# Patient Record
Sex: Female | Born: 1955 | Race: White | Hispanic: No | Smoking: Current every day smoker
Health system: Southern US, Community
[De-identification: ages and names within clinical notes are randomized; demographics above are authoritative.]

## PROBLEM LIST (undated history)

## (undated) ENCOUNTER — Emergency Department (HOSPITAL_COMMUNITY): Admission: EM | Payer: Self-pay | Source: Home / Self Care

## (undated) DIAGNOSIS — I10 Essential (primary) hypertension: Secondary | ICD-10-CM

## (undated) DIAGNOSIS — I639 Cerebral infarction, unspecified: Secondary | ICD-10-CM

## (undated) DIAGNOSIS — E876 Hypokalemia: Secondary | ICD-10-CM

## (undated) DIAGNOSIS — F0390 Unspecified dementia without behavioral disturbance: Secondary | ICD-10-CM

## (undated) DIAGNOSIS — F17200 Nicotine dependence, unspecified, uncomplicated: Secondary | ICD-10-CM

## (undated) DIAGNOSIS — R1310 Dysphagia, unspecified: Secondary | ICD-10-CM

## (undated) HISTORY — PX: ABDOMINAL HYSTERECTOMY: SUR658

---

## 2001-04-16 ENCOUNTER — Emergency Department (HOSPITAL_COMMUNITY): Admission: EM | Admit: 2001-04-16 | Discharge: 2001-04-16 | Payer: Self-pay | Admitting: Emergency Medicine

## 2003-05-04 ENCOUNTER — Emergency Department (HOSPITAL_COMMUNITY): Admission: EM | Admit: 2003-05-04 | Discharge: 2003-05-04 | Payer: Self-pay | Admitting: Emergency Medicine

## 2005-04-16 ENCOUNTER — Emergency Department (HOSPITAL_COMMUNITY): Admission: EM | Admit: 2005-04-16 | Discharge: 2005-04-16 | Payer: Self-pay | Admitting: Emergency Medicine

## 2012-10-22 ENCOUNTER — Other Ambulatory Visit: Payer: Self-pay | Admitting: Internal Medicine

## 2020-09-17 ENCOUNTER — Emergency Department (HOSPITAL_COMMUNITY): Payer: Medicare Other

## 2020-09-17 ENCOUNTER — Inpatient Hospital Stay (HOSPITAL_COMMUNITY)
Admission: EM | Admit: 2020-09-17 | Discharge: 2020-10-09 | DRG: 064 | Disposition: A | Discharge status: Skilled Nursing Facility | Payer: Medicare Other | Attending: Internal Medicine | Admitting: Internal Medicine

## 2020-09-17 DIAGNOSIS — I6349 Cerebral infarction due to embolism of other cerebral artery: Secondary | ICD-10-CM | POA: Diagnosis not present

## 2020-09-17 DIAGNOSIS — E559 Vitamin D deficiency, unspecified: Secondary | ICD-10-CM | POA: Diagnosis present

## 2020-09-17 DIAGNOSIS — R29810 Facial weakness: Secondary | ICD-10-CM | POA: Diagnosis present

## 2020-09-17 DIAGNOSIS — N390 Urinary tract infection, site not specified: Secondary | ICD-10-CM | POA: Diagnosis not present

## 2020-09-17 DIAGNOSIS — Z20822 Contact with and (suspected) exposure to covid-19: Secondary | ICD-10-CM | POA: Diagnosis present

## 2020-09-17 DIAGNOSIS — G8191 Hemiplegia, unspecified affecting right dominant side: Secondary | ICD-10-CM | POA: Diagnosis present

## 2020-09-17 DIAGNOSIS — R636 Underweight: Secondary | ICD-10-CM | POA: Diagnosis present

## 2020-09-17 DIAGNOSIS — R159 Full incontinence of feces: Secondary | ICD-10-CM | POA: Diagnosis present

## 2020-09-17 DIAGNOSIS — R109 Unspecified abdominal pain: Secondary | ICD-10-CM

## 2020-09-17 DIAGNOSIS — R509 Fever, unspecified: Secondary | ICD-10-CM

## 2020-09-17 DIAGNOSIS — Z23 Encounter for immunization: Secondary | ICD-10-CM

## 2020-09-17 DIAGNOSIS — E876 Hypokalemia: Secondary | ICD-10-CM | POA: Diagnosis present

## 2020-09-17 DIAGNOSIS — R64 Cachexia: Secondary | ICD-10-CM | POA: Diagnosis present

## 2020-09-17 DIAGNOSIS — I10 Essential (primary) hypertension: Secondary | ICD-10-CM | POA: Diagnosis present

## 2020-09-17 DIAGNOSIS — R32 Unspecified urinary incontinence: Secondary | ICD-10-CM | POA: Diagnosis present

## 2020-09-17 DIAGNOSIS — R29710 NIHSS score 10: Secondary | ICD-10-CM | POA: Diagnosis present

## 2020-09-17 DIAGNOSIS — A419 Sepsis, unspecified organism: Secondary | ICD-10-CM | POA: Diagnosis not present

## 2020-09-17 DIAGNOSIS — R4182 Altered mental status, unspecified: Secondary | ICD-10-CM

## 2020-09-17 DIAGNOSIS — I639 Cerebral infarction, unspecified: Secondary | ICD-10-CM | POA: Diagnosis present

## 2020-09-17 DIAGNOSIS — J449 Chronic obstructive pulmonary disease, unspecified: Secondary | ICD-10-CM | POA: Diagnosis present

## 2020-09-17 DIAGNOSIS — Z681 Body mass index (BMI) 19 or less, adult: Secondary | ICD-10-CM

## 2020-09-17 DIAGNOSIS — D72829 Elevated white blood cell count, unspecified: Secondary | ICD-10-CM | POA: Diagnosis present

## 2020-09-17 DIAGNOSIS — R1310 Dysphagia, unspecified: Secondary | ICD-10-CM | POA: Diagnosis present

## 2020-09-17 DIAGNOSIS — F17200 Nicotine dependence, unspecified, uncomplicated: Secondary | ICD-10-CM | POA: Diagnosis present

## 2020-09-17 DIAGNOSIS — E785 Hyperlipidemia, unspecified: Secondary | ICD-10-CM | POA: Diagnosis present

## 2020-09-17 DIAGNOSIS — R54 Age-related physical debility: Secondary | ICD-10-CM | POA: Diagnosis present

## 2020-09-17 DIAGNOSIS — R5381 Other malaise: Secondary | ICD-10-CM | POA: Diagnosis present

## 2020-09-17 DIAGNOSIS — G9341 Metabolic encephalopathy: Secondary | ICD-10-CM | POA: Diagnosis present

## 2020-09-17 DIAGNOSIS — K029 Dental caries, unspecified: Secondary | ICD-10-CM | POA: Diagnosis present

## 2020-09-17 DIAGNOSIS — I6523 Occlusion and stenosis of bilateral carotid arteries: Secondary | ICD-10-CM | POA: Diagnosis present

## 2020-09-17 HISTORY — DX: Nicotine dependence, unspecified, uncomplicated: F17.200

## 2020-09-17 LAB — COMPREHENSIVE METABOLIC PANEL
ALT: 13 U/L (ref 0–44)
AST: 19 U/L (ref 15–41)
Albumin: 4.2 g/dL (ref 3.5–5.0)
Alkaline Phosphatase: 77 U/L (ref 38–126)
Anion gap: 12 (ref 5–15)
BUN: 13 mg/dL (ref 8–23)
CO2: 25 mmol/L (ref 22–32)
Calcium: 9.5 mg/dL (ref 8.9–10.3)
Chloride: 103 mmol/L (ref 98–111)
Creatinine, Ser: 0.77 mg/dL (ref 0.44–1.00)
GFR, Estimated: >60 mL/min (ref >60)
Glucose, Bld: 120 mg/dL — ABNORMAL HIGH (ref 70–99)
Potassium: 3.5 mmol/L (ref 3.5–5.1)
Sodium: 140 mmol/L (ref 135–145)
Total Bilirubin: 0.7 mg/dL (ref 0.3–1.2)
Total Protein: 7.5 g/dL (ref 6.5–8.1)

## 2020-09-17 LAB — CBC WITH DIFFERENTIAL/PLATELET
Abs Immature Granulocytes: 0.04 10*3/uL (ref 0.00–0.07)
Basophils Absolute: 0.1 10*3/uL (ref 0.0–0.1)
Basophils Relative: 0 %
Eosinophils Absolute: 0 10*3/uL (ref 0.0–0.5)
Eosinophils Relative: 0 %
HCT: 45 % (ref 36.0–46.0)
Hemoglobin: 15.1 g/dL — ABNORMAL HIGH (ref 12.0–15.0)
Immature Granulocytes: 0 %
Lymphocytes Relative: 16 %
Lymphs Abs: 1.8 10*3/uL (ref 0.7–4.0)
MCH: 31.5 pg (ref 26.0–34.0)
MCHC: 33.6 g/dL (ref 30.0–36.0)
MCV: 93.8 fL (ref 80.0–100.0)
Monocytes Absolute: 0.8 10*3/uL (ref 0.1–1.0)
Monocytes Relative: 7 %
Neutro Abs: 8.9 10*3/uL — ABNORMAL HIGH (ref 1.7–7.7)
Neutrophils Relative %: 77 %
Platelets: 308 10*3/uL (ref 150–400)
RBC: 4.8 MIL/uL (ref 3.87–5.11)
RDW: 13.4 % (ref 11.5–15.5)
WBC: 11.6 10*3/uL — ABNORMAL HIGH (ref 4.0–10.5)
nRBC: 0 % (ref 0.0–0.2)

## 2020-09-17 LAB — RESP PANEL BY RT-PCR (FLU A&B, COVID) ARPGX2
Influenza A by PCR: NEGATIVE
Influenza B by PCR: NEGATIVE
SARS Coronavirus 2 by RT PCR: NEGATIVE

## 2020-09-17 LAB — BLOOD GAS, ARTERIAL
Acid-base deficit: 0.6 mmol/L (ref 0.0–2.0)
Bicarbonate: 24 mmol/L (ref 20.0–28.0)
FIO2: 28
O2 Saturation: 93.5 %
Patient temperature: 37
pCO2 arterial: 37.7 mmHg (ref 32.0–48.0)
pH, Arterial: 7.41 (ref 7.350–7.450)
pO2, Arterial: 74.4 mmHg — ABNORMAL LOW (ref 83.0–108.0)

## 2020-09-17 LAB — ETHANOL: Alcohol, Ethyl (B): <10 mg/dL (ref <10)

## 2020-09-17 LAB — TROPONIN I (HIGH SENSITIVITY): Troponin I (High Sensitivity): 5 ng/L (ref <18)

## 2020-09-17 MED ORDER — NALOXONE HCL 2 MG/2ML IJ SOSY
1.0000 mg | PREFILLED_SYRINGE | Freq: Once | INTRAMUSCULAR | Status: AC
  Administered 2020-09-17: 1 mg via INTRAVENOUS
  Filled 2020-09-17: qty 2

## 2020-09-17 NOTE — ED Triage Notes (Signed)
Pt spoke to family on the phone Friday. Family hasn't heard from her since. Pt found this afternoon on couch. Pt altered, soiled self with urine and feces. Pt has 18G left wrist placed by EMS, 125 solumedrol and albuterol.

## 2020-09-17 NOTE — ED Provider Notes (Signed)
[facility] EMERGENCY DEPARTMENT Provider Note   CSN: [id number] Arrival date & time: 09/17/20  2226     History Chief Complaint  Patient presents with   Altered Mental Status    Kendra Lee is a 65 y.o. female. level 5 caveat due to altered mental status. The history is provided by the EMS personnel.  Altered Mental Status Patient brought in with altered mental status.  Reportedly does not really see doctors and only occasionally takes some Benadryl.  Last seen by family couple days ago.  Found on the couch today.  Incontinent of urine and feces.  Hypertensive.  Had some harsh breath sounds.  Reportedly is a heavy smoker.  Is awake and will say occasional words and follow commands but really cannot provide any history.    No past medical history on file.  There are no problems to display for this patient.     OB History   No obstetric history on file.     No family history on file.     Home Medications Prior to Admission medications   Not on File    Allergies    Patient has no allergy information on record.  Review of Systems   Review of Systems  Unable to perform ROS: Mental status change   Physical Exam Updated Vital Signs BP (!) 192/88   Pulse 84   Temp 98.1 F (36.7 C) (Core (Comment))   Resp 17   SpO2 99%   Physical Exam Vitals and nursing note reviewed.  Constitutional:      Comments: Sitting in bed.  Drooling some but will awaken answer questions.  Will squeeze both hands to commands.  However very few words.  HENT:     Head: Normocephalic and atraumatic.  Eyes:     Pupils: Pupils are equal, round, and reactive to light.  Cardiovascular:     Rate and Rhythm: Regular rhythm.  Pulmonary:     Comments: Harsh breath sounds. Abdominal:     Tenderness: There is no abdominal tenderness.  Musculoskeletal:        General: No tenderness.     Cervical back: Neck supple.     Right lower leg: No edema.     Left lower leg: No edema.  Skin:     General: Skin is warm.     Capillary Refill: Capillary refill takes less than 2 seconds.  Neurological:     Comments: Awake and will answer some minimal questions and follow commands although really cannot provide history.  Moving bilateral extremities.  Some drooling.    ED Results / Procedures / Treatments   Labs (all labs ordered are listed, but only abnormal results are displayed) Labs Reviewed  COMPREHENSIVE METABOLIC PANEL - Abnormal; Notable for the following components:      Result Value   Glucose, Bld 120 (*)    All other components within normal limits  CBC WITH DIFFERENTIAL/PLATELET - Abnormal; Notable for the following components:   WBC 11.6 (*)    Hemoglobin 15.1 (*)    Neutro Abs 8.9 (*)    All other components within normal limits  BLOOD GAS, ARTERIAL - Abnormal; Notable for the following components:   pO2, Arterial 74.4 (*)    All other components within normal limits  RESP PANEL BY RT-PCR (FLU A&B, COVID) ARPGX2  CULTURE, BLOOD (ROUTINE X 2)  CULTURE, BLOOD (ROUTINE X 2)  ETHANOL  RAPID URINE DRUG SCREEN, HOSP PERFORMED  LACTIC ACID, PLASMA  LACTIC ACID, PLASMA  URINALYSIS, ROUTINE W REFLEX MICROSCOPIC  AMMONIA  TSH  TROPONIN I (HIGH SENSITIVITY)  TROPONIN I (HIGH SENSITIVITY)    EKG None  Radiology CT Head Wo Contrast  Result Date: 09/17/2020 CLINICAL DATA:  Mental status change, found this afternoon on couch EXAM: CT HEAD WITHOUT CONTRAST TECHNIQUE: Contiguous axial images were obtained from the base of the skull through the vertex without intravenous contrast. COMPARISON:  None. FINDINGS: Brain: Foci of hypoattenuation present in the right internal capsule as as a separate focus in the right caudate and posterior limb of the left internal capsule could reflect sequela of age-indeterminate lacunar type infarcts difficult to fully assess given a background of more diffuse patchy white matter hypoattenuation typically indicative of microvascular angiopathy  in a patient of this age. Additional hypoattenuation in the pons and brainstem is more nonspecific given streak artifact across skull base. Symmetric prominence of the ventricles, cisterns and sulci compatible with parenchymal volume loss. No hyperdense hemorrhage. No mass effect or midline shift. No extra-axial collection. Scattered benign dural calcifications. Vascular: Atherosclerotic calcification of the carotid siphons. No hyperdense vessel. Skull: No calvarial fracture or suspicious osseous lesion. No scalp swelling or hematoma. Sinuses/Orbits: Paranasal sinuses and mastoid air cells are predominantly clear. Other: Included orbital structures are unremarkable. 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 Could be further characterized with MR imaging as warranted. No other acute intracranial abnormality. These results were called by telephone at the time of interpretation on 09/17/2020 at 11:45 pm to provider Benjiman Core , who verbally acknowledged these results. Electronically Signed   By: Kreg Shropshire M.D.   On: 09/17/2020 23:45   DG Chest Portable 1 View  Result Date: 09/17/2020 CLINICAL DATA:  Altered mental status EXAM: PORTABLE CHEST 1 VIEW COMPARISON:  None FINDINGS: There is hyperinflation of the lungs compatible with COPD. Heart and mediastinal contours are within normal limits. No focal opacities or effusions. No acute bony abnormality. IMPRESSION: COPD.  No active disease. Electronically Signed   By: Charlett Nose M.D.   On: 09/17/2020 23:02    Procedures Procedures   Medications Ordered in ED Medications  naloxone [facility]) injection 1 mg (1 mg Intravenous Given 09/17/20 2340)    ED Course  I have reviewed the triage  vital signs and the nursing notes.  Pertinent labs & imaging results that were available during my care of the patient were reviewed by me and considered in my medical decision making (see chart for details).    MDM Rules/Calculators/A&P                          Patient brought in with mental status change.  Last seen 2 days ago.  Moves all extremities but appears to be moving right upper extremity less than left.  Family later came and thinks there is a new facial droop on the right side.  Hypertensive.  Head CT shows possible acute strokes.  Does not appear to need intubation at this time.  Care turned over to Dr. Manus Gunning.  There is potential of acute stroke but last normal would have been around 2 days ago so not a tPA candidate.  CRITICAL CARE Performed by: Benjiman Core Total critical care time:30  minutes Critical care time was exclusive  of separately billable procedures and treating other patients. Critical care was necessary to treat or prevent imminent or life-threatening deterioration. Critical care was time spent personally by me on the following activities: development of treatment plan with patient and/or surrogate as well as nursing, discussions with consultants, evaluation of patient's response to treatment, examination of patient, obtaining history from patient or surrogate, ordering and performing treatments and interventions, ordering and review of laboratory studies, ordering and review of radiographic studies, pulse oximetry and re-evaluation of patient's condition.  Final Clinical Impression(s) / ED Diagnoses Final diagnoses:  Altered mental status, unspecified altered mental status type  Hypertension, unspecified type    Rx / DC Orders ED Discharge Orders     None        Benjiman Core, MD 09/18/20 [phone]

## 2020-09-18 ENCOUNTER — Inpatient Hospital Stay (HOSPITAL_COMMUNITY): Payer: Medicare Other

## 2020-09-18 DIAGNOSIS — K029 Dental caries, unspecified: Secondary | ICD-10-CM | POA: Diagnosis present

## 2020-09-18 DIAGNOSIS — D72829 Elevated white blood cell count, unspecified: Secondary | ICD-10-CM

## 2020-09-18 DIAGNOSIS — E876 Hypokalemia: Secondary | ICD-10-CM | POA: Diagnosis present

## 2020-09-18 DIAGNOSIS — J449 Chronic obstructive pulmonary disease, unspecified: Secondary | ICD-10-CM | POA: Diagnosis present

## 2020-09-18 DIAGNOSIS — R29710 NIHSS score 10: Secondary | ICD-10-CM | POA: Diagnosis present

## 2020-09-18 DIAGNOSIS — I639 Cerebral infarction, unspecified: Secondary | ICD-10-CM | POA: Diagnosis present

## 2020-09-18 DIAGNOSIS — R29810 Facial weakness: Secondary | ICD-10-CM | POA: Diagnosis present

## 2020-09-18 DIAGNOSIS — I6389 Other cerebral infarction: Secondary | ICD-10-CM | POA: Diagnosis not present

## 2020-09-18 DIAGNOSIS — G9341 Metabolic encephalopathy: Secondary | ICD-10-CM | POA: Diagnosis present

## 2020-09-18 DIAGNOSIS — Z681 Body mass index (BMI) 19 or less, adult: Secondary | ICD-10-CM | POA: Diagnosis not present

## 2020-09-18 DIAGNOSIS — I10 Essential (primary) hypertension: Secondary | ICD-10-CM

## 2020-09-18 DIAGNOSIS — A419 Sepsis, unspecified organism: Secondary | ICD-10-CM | POA: Diagnosis not present

## 2020-09-18 DIAGNOSIS — R159 Full incontinence of feces: Secondary | ICD-10-CM | POA: Diagnosis present

## 2020-09-18 DIAGNOSIS — N390 Urinary tract infection, site not specified: Secondary | ICD-10-CM | POA: Diagnosis not present

## 2020-09-18 DIAGNOSIS — E559 Vitamin D deficiency, unspecified: Secondary | ICD-10-CM | POA: Diagnosis present

## 2020-09-18 DIAGNOSIS — Z20822 Contact with and (suspected) exposure to covid-19: Secondary | ICD-10-CM | POA: Diagnosis present

## 2020-09-18 DIAGNOSIS — R54 Age-related physical debility: Secondary | ICD-10-CM | POA: Diagnosis present

## 2020-09-18 DIAGNOSIS — I6349 Cerebral infarction due to embolism of other cerebral artery: Secondary | ICD-10-CM | POA: Diagnosis present

## 2020-09-18 DIAGNOSIS — Z7189 Other specified counseling: Secondary | ICD-10-CM | POA: Diagnosis not present

## 2020-09-18 DIAGNOSIS — I6523 Occlusion and stenosis of bilateral carotid arteries: Secondary | ICD-10-CM | POA: Diagnosis present

## 2020-09-18 DIAGNOSIS — R32 Unspecified urinary incontinence: Secondary | ICD-10-CM | POA: Diagnosis present

## 2020-09-18 DIAGNOSIS — Z515 Encounter for palliative care: Secondary | ICD-10-CM | POA: Diagnosis not present

## 2020-09-18 DIAGNOSIS — R64 Cachexia: Secondary | ICD-10-CM | POA: Diagnosis present

## 2020-09-18 DIAGNOSIS — R1310 Dysphagia, unspecified: Secondary | ICD-10-CM | POA: Diagnosis present

## 2020-09-18 DIAGNOSIS — E785 Hyperlipidemia, unspecified: Secondary | ICD-10-CM | POA: Diagnosis present

## 2020-09-18 DIAGNOSIS — G8191 Hemiplegia, unspecified affecting right dominant side: Secondary | ICD-10-CM | POA: Diagnosis present

## 2020-09-18 DIAGNOSIS — Z23 Encounter for immunization: Secondary | ICD-10-CM | POA: Diagnosis present

## 2020-09-18 DIAGNOSIS — R5381 Other malaise: Secondary | ICD-10-CM | POA: Diagnosis present

## 2020-09-18 LAB — URINALYSIS, ROUTINE W REFLEX MICROSCOPIC
Bacteria, UA: NONE SEEN
Bilirubin Urine: NEGATIVE
Glucose, UA: NEGATIVE mg/dL
Ketones, ur: 20 mg/dL — AB
Leukocytes,Ua: NEGATIVE
Nitrite: NEGATIVE
Protein, ur: NEGATIVE mg/dL
Specific Gravity, Urine: 1.013 (ref 1.005–1.030)
pH: 5 (ref 5.0–8.0)

## 2020-09-18 LAB — ECHOCARDIOGRAM COMPLETE
AR max vel: 2.85 cm2
AV Area VTI: 2.71 cm2
AV Area mean vel: 2.55 cm2
AV Mean grad: 3 mmHg
AV Peak grad: 5.7 mmHg
Ao pk vel: 1.19 m/s
Area-P 1/2: 3.15 cm2
MV VTI: 2.54 cm2
S' Lateral: 2.91 cm

## 2020-09-18 LAB — CBC
HCT: 41 % (ref 36.0–46.0)
Hemoglobin: 13.8 g/dL (ref 12.0–15.0)
MCH: 31.7 pg (ref 26.0–34.0)
MCHC: 33.7 g/dL (ref 30.0–36.0)
MCV: 94 fL (ref 80.0–100.0)
Platelets: 301 10*3/uL (ref 150–400)
RBC: 4.36 MIL/uL (ref 3.87–5.11)
RDW: 13.4 % (ref 11.5–15.5)
WBC: 9.8 10*3/uL (ref 4.0–10.5)
nRBC: 0 % (ref 0.0–0.2)

## 2020-09-18 LAB — COMPREHENSIVE METABOLIC PANEL
ALT: 12 U/L (ref 0–44)
AST: 16 U/L (ref 15–41)
Albumin: 3.7 g/dL (ref 3.5–5.0)
Alkaline Phosphatase: 66 U/L (ref 38–126)
Anion gap: 9 (ref 5–15)
BUN: 15 mg/dL (ref 8–23)
CO2: 25 mmol/L (ref 22–32)
Calcium: 9.2 mg/dL (ref 8.9–10.3)
Chloride: 106 mmol/L (ref 98–111)
Creatinine, Ser: 0.66 mg/dL (ref 0.44–1.00)
GFR, Estimated: >60 mL/min (ref >60)
Glucose, Bld: 149 mg/dL — ABNORMAL HIGH (ref 70–99)
Potassium: 3.6 mmol/L (ref 3.5–5.1)
Sodium: 140 mmol/L (ref 135–145)
Total Bilirubin: 0.6 mg/dL (ref 0.3–1.2)
Total Protein: 6.6 g/dL (ref 6.5–8.1)

## 2020-09-18 LAB — LIPID PANEL
Cholesterol: 217 mg/dL — ABNORMAL HIGH (ref 0–200)
HDL: 63 mg/dL (ref >40)
LDL Cholesterol: 138 mg/dL — ABNORMAL HIGH (ref 0–99)
Total CHOL/HDL Ratio: 3.4 RATIO
Triglycerides: 81 mg/dL (ref <150)
VLDL: 16 mg/dL (ref 0–40)

## 2020-09-18 LAB — LACTIC ACID, PLASMA
Lactic Acid, Venous: 1.2 mmol/L (ref 0.5–1.9)
Lactic Acid, Venous: 1.4 mmol/L (ref 0.5–1.9)

## 2020-09-18 LAB — RAPID URINE DRUG SCREEN, HOSP PERFORMED
Amphetamines: NOT DETECTED
Barbiturates: NOT DETECTED
Benzodiazepines: NOT DETECTED
Cocaine: NOT DETECTED
Opiates: NOT DETECTED
Tetrahydrocannabinol: NOT DETECTED

## 2020-09-18 LAB — TROPONIN I (HIGH SENSITIVITY): Troponin I (High Sensitivity): 6 ng/L (ref <18)

## 2020-09-18 LAB — AMMONIA: Ammonia: 15 umol/L (ref 9–35)

## 2020-09-18 LAB — VITAMIN B12: Vitamin B-12: 262 pg/mL (ref 180–914)

## 2020-09-18 LAB — TSH: TSH: 1.267 u[IU]/mL (ref 0.350–4.500)

## 2020-09-18 LAB — MAGNESIUM: Magnesium: 2.1 mg/dL (ref 1.7–2.4)

## 2020-09-18 LAB — HIV ANTIBODY (ROUTINE TESTING W REFLEX): HIV Screen 4th Generation wRfx: NONREACTIVE

## 2020-09-18 MED ORDER — HEPARIN SODIUM (PORCINE) 5000 UNIT/ML IJ SOLN
5000.0000 [IU] | Freq: Three times a day (TID) | INTRAMUSCULAR | Status: DC
  Administered 2020-09-18 – 2020-09-21 (×10): 5000 [IU] via SUBCUTANEOUS
  Filled 2020-09-18 (×11): qty 1

## 2020-09-18 MED ORDER — CYANOCOBALAMIN 1000 MCG/ML IJ SOLN
1000.0000 ug | Freq: Once | INTRAMUSCULAR | Status: AC
  Administered 2020-09-18: 1000 ug via INTRAMUSCULAR
  Filled 2020-09-18: qty 1

## 2020-09-18 MED ORDER — ACETAMINOPHEN 650 MG RE SUPP
650.0000 mg | RECTAL | Status: DC | PRN
  Administered 2020-09-22 – 2020-10-04 (×2): 650 mg via RECTAL
  Filled 2020-09-18 (×3): qty 1

## 2020-09-18 MED ORDER — SODIUM CHLORIDE 0.9 % IV SOLN: INTRAVENOUS | Status: DC

## 2020-09-18 MED ORDER — ACETAMINOPHEN 325 MG PO TABS
650.0000 mg | ORAL_TABLET | ORAL | Status: DC | PRN
  Administered 2020-09-28 – 2020-10-06 (×2): 650 mg via ORAL
  Filled 2020-09-18 (×2): qty 2

## 2020-09-18 MED ORDER — ASPIRIN 300 MG RE SUPP
300.0000 mg | Freq: Every day | RECTAL | Status: DC
  Administered 2020-09-18 – 2020-09-19 (×2): 300 mg via RECTAL
  Filled 2020-09-18 (×3): qty 1

## 2020-09-18 MED ORDER — STROKE: EARLY STAGES OF RECOVERY BOOK
Freq: Once | Status: AC
  Filled 2020-09-18: qty 1

## 2020-09-18 MED ORDER — ACETAMINOPHEN 160 MG/5ML PO SOLN: 650.0000 mg | ORAL | Status: DC | PRN

## 2020-09-18 MED ORDER — AMLODIPINE BESYLATE 5 MG PO TABS: 5.0000 mg | ORAL_TABLET | Freq: Every day | ORAL | Status: DC

## 2020-09-18 MED ORDER — HYDRALAZINE HCL 20 MG/ML IJ SOLN
10.0000 mg | INTRAMUSCULAR | Status: DC | PRN
  Administered 2020-09-18 – 2020-09-19 (×4): 10 mg via INTRAVENOUS
  Filled 2020-09-18 (×4): qty 1

## 2020-09-18 MED ORDER — ATORVASTATIN CALCIUM 40 MG PO TABS
80.0000 mg | ORAL_TABLET | Freq: Every day | ORAL | Status: DC
  Filled 2020-09-18 (×4): qty 2

## 2020-09-18 MED ORDER — ASPIRIN EC 81 MG PO TBEC: 81.0000 mg | DELAYED_RELEASE_TABLET | Freq: Every day | ORAL | Status: DC

## 2020-09-18 MED ORDER — AMLODIPINE BESYLATE 5 MG PO TABS
5.0000 mg | ORAL_TABLET | Freq: Every day | ORAL | Status: DC
  Filled 2020-09-18 (×4): qty 1

## 2020-09-18 MED ORDER — THIAMINE HCL 100 MG/ML IJ SOLN
100.0000 mg | Freq: Every day | INTRAMUSCULAR | Status: DC
  Administered 2020-09-18 – 2020-09-23 (×6): 100 mg via INTRAVENOUS
  Filled 2020-09-18 (×7): qty 2

## 2020-09-18 MED ORDER — HYDRALAZINE HCL 20 MG/ML IJ SOLN: 10.0000 mg | INTRAMUSCULAR | Status: DC | PRN

## 2020-09-18 MED ORDER — DEXTROSE-NACL 5-0.45 % IV SOLN: INTRAVENOUS | Status: DC

## 2020-09-18 MED ORDER — CLOPIDOGREL BISULFATE 75 MG PO TABS
75.0000 mg | ORAL_TABLET | Freq: Every day | ORAL | Status: DC
  Filled 2020-09-18 (×3): qty 1

## 2020-09-18 MED ORDER — ASPIRIN 325 MG PO TABS
325.0000 mg | ORAL_TABLET | Freq: Every day | ORAL | Status: DC
  Filled 2020-09-18 (×2): qty 1

## 2020-09-18 MED ORDER — ASPIRIN 325 MG PO TABS: 325.0000 mg | ORAL_TABLET | Freq: Every day | ORAL | Status: DC

## 2020-09-18 NOTE — ED Notes (Signed)
Patient transported to MRI 

## 2020-09-18 NOTE — Progress Notes (Signed)
  Echocardiogram 2D Echocardiogram has been performed.  Carolyne Fiscal 09/18/2020, 1:48 PM

## 2020-09-18 NOTE — H&P (Signed)
TRH H&P    Patient Demographics:    Kendra Lee, is a 65 y.o. female  MRN: [id number]  DOB - Nov 25, 1955  Admit Date - 09/17/2020  Referring MD/NP/PA: Rancour  Outpatient Primary MD for the patient is Pcp, No  Patient coming from: home  Chief complaint- altered mental status   HPI:    Kendra Lee  is a 65 y.o. female, with no known medical history, who presents to the ED with altered mental status. History is quite limited 2/2 patient's non verbal status. Earlier, family was able to provide some history to the ED provider. They reported that the patient's last known normal was 6/24. She has been somnolent, and not taking care of herself in that interval. Family was not aware of an EtOH or illicit drug use. They are not aware of an trauma, and there are not signs of trauma on exam. Family reported no infectious symptoms or fevers.   In the ED Temp shows 98.1, heart rate 84-90, respiratory rate 15-23 blood pressure 174/74, blood pressure had been as high as 230/99 Patient maintaining oxygen sats on 2 L nasal cannula pH 7.410, PO2 is slightly decreased at 74.4, no CO2 retention Blood cultures pending CT head shows focal regions of hypoattenuation seen in the genu of the right internal capsule, posterior limb of the left internal capsule and right thalamus which could reflect age-indeterminate infarction.  No other acute abnormality Chest x-ray shows COPD no active disease Slight leukocytosis at 11.6, hemoglobin 15.1 Chemistry panel is unremarkable Troponin 5 Negative respiratory panel Alcohol level less than 10 EKG shows a heart rate of 87, QTc 476, sinus rhythm, no specific ST changes    Review of systems:    Unfortunately, ROS could not be obtained 2/2 altered mental status    Past History of the following :    No past medical history on file.    Unfortunately medical history could not be  reviewed 2/2 altered mental status    Social History:      Social History   Tobacco Use   Smoking status: Not on file   Smokeless tobacco: Not on file  Substance Use Topics   Alcohol use: Not on file       Family History :    No family history on file. Unfortunately family history could not be reviewed secondary to patient's altered mental status   Home Medications:   Prior to Admission medications   Not on File     Allergies:    Not on File   Physical Exam:   Vitals  Blood pressure (!) 198/95, pulse 84, temperature 98.1 F (36.7 C), temperature source Core (Comment), resp. rate (!) 22, SpO2 99 %.  1.  General: Patient lying supine in bed,  no acute distress   2. Psychiatric: Unable to perform psych exam 2/2 non verbal status   3. Neurologic: Obtunded, opens eyes to voice, nonverbal, face is symmetric, withdraws extremities to pain, but weak in the right upper extremity   4. HEENMT:  Head is  atraumatic, normocephalic, pupils reactive to light, neck is supple, trachea is midline, mucous membranes mildly dry   5. Respiratory : Lungs are clear to auscultation bilaterally without wheezing, rhonchi, rales, no cyanosis, no increase in work of breathing or accessory muscle use   6. Cardiovascular : Heart rate normal, rhythm is regular, no murmurs, rubs or gallops, no peripheral edema, peripheral pulses palpated   7. Gastrointestinal:  Abdomen is soft, nondistended, nontender to palpation bowel sounds active, no masses or organomegaly palpated   8. Skin:  Skin is warm, dry and intact without rashes, acute lesions, or ulcers on limited exam   9.Musculoskeletal:  No acute deformities or trauma, no asymmetry in tone, no peripheral edema, peripheral pulses palpated, no tenderness to palpation in the extremities     Data Review:    CBC Recent Labs  Lab 09/17/20 2200  WBC 11.6*  HGB 15.1*  HCT 45.0  PLT 308  MCV 93.8  MCH 31.5  MCHC 33.6  RDW 13.4   LYMPHSABS 1.8  MONOABS 0.8  EOSABS 0.0  BASOSABS 0.1   ------------------------------------------------------------------------------------------------------------------  Results for orders placed or performed during the hospital encounter of 09/17/20 (from the past 48 hour(s))  Comprehensive metabolic panel     Status: Abnormal   Collection Time: 09/17/20 10:00 PM  Result Value Ref Range   Sodium 140 135 - 145 mmol/L   Potassium 3.5 3.5 - 5.1 mmol/L   Chloride 103 98 - 111 mmol/L   CO2 25 22 - 32 mmol/L   Glucose, Bld 120 (H) 70 - 99 mg/dL    Comment: Glucose reference range applies only to samples taken after fasting for at least 8 hours.   BUN 13 8 - 23 mg/dL   Creatinine, Ser 1.610.77 0.44 - 1.00 mg/dL   Calcium 9.5 8.9 - 09.610.3 mg/dL   Total Protein 7.5 6.5 - 8.1 g/dL   Albumin 4.2 3.5 - 5.0 g/dL   AST 19 15 - 41 U/L   ALT 13 0 - 44 U/L   Alkaline Phosphatase 77 38 - 126 U/L   Total Bilirubin 0.7 0.3 - 1.2 mg/dL   GFR, Estimated >04>60 >54>60 mL/min    Comment: (NOTE) Calculated using the CKD-EPI Creatinine Equation (2021)    Anion gap 12 5 - 15    Comment: Performed at [facility], [address] [place], [place] [address]  Ethanol     Status: None   Collection Time: 09/17/20 10:00 PM  Result Value Ref Range   Alcohol, Ethyl (B) <10 <10 mg/dL    Comment: (NOTE) Lowest detectable limit for serum alcohol is 10 mg/dL.  For medical purposes only. Performed at [facility], [address] [place], [place] [address]   CBC with Differential     Status: Abnormal   Collection Time: 09/17/20 10:00 PM  Result Value Ref Range   WBC 11.6 (H) 4.0 - 10.5 K/uL   RBC 4.80 3.87 - 5.11 MIL/uL   Hemoglobin 15.1 (H) 12.0 - 15.0 g/dL   HCT 29.545.0 62.136.0 - 30.846.0 %   MCV 93.8 80.0 - 100.0 fL   MCH 31.5 26.0 - 34.0 pg   MCHC 33.6 30.0 - 36.0 g/dL   RDW 65.713.4 84.611.5 - 96.215.5 %   Platelets 308 150 - 400 K/uL   nRBC 0.0 0.0 - 0.2 %   Neutrophils Relative % 77 %   Neutro Abs 8.9 (H) 1.7 - 7.7 K/uL    Lymphocytes Relative 16 %   Lymphs Abs 1.8 0.7 -  4.0 K/uL   Monocytes Relative 7 %   Monocytes Absolute 0.8 0.1 - 1.0 K/uL   Eosinophils Relative 0 %   Eosinophils Absolute 0.0 0.0 - 0.5 K/uL   Basophils Relative 0 %   Basophils Absolute 0.1 0.0 - 0.1 K/uL   Immature Granulocytes 0 %   Abs Immature Granulocytes 0.04 0.00 - 0.07 K/uL    Comment: Performed at [facility], [address] [place], [place] [address]  Troponin I (High Sensitivity)     Status: None   Collection Time: 09/17/20 10:00 PM  Result Value Ref Range   Troponin I (High Sensitivity) 5 <18 ng/L    Comment: (NOTE) Elevated high sensitivity troponin I (hsTnI) values and significant  changes across serial measurements may suggest ACS but many other  chronic and acute conditions are known to elevate hsTnI results.  Refer to the "Links" section for chest pain algorithms and additional  guidance. Performed at [facility], [address], [place], [place] [address]   Resp Panel by RT-PCR (Flu A&B, Covid) Nasopharyngeal Swab     Status: None   Collection Time: 09/17/20 10:35 PM   Specimen: Nasopharyngeal Swab; Nasopharyngeal(NP) swabs in vial transport medium  Result Value Ref Range   SARS Coronavirus 2 by RT PCR NEGATIVE NEGATIVE    Comment: (NOTE) SARS-CoV-2 target nucleic acids are NOT DETECTED.  The SARS-CoV-2 RNA is generally detectable in upper respiratory specimens during the acute phase of infection. The lowest concentration of SARS-CoV-2 viral copies this assay can detect is 138 copies/mL. A negative result does not preclude SARS-Cov-2 infection and should not be used as the sole basis for treatment or other patient management decisions. A negative result may occur with  improper specimen collection/handling, submission of specimen other than nasopharyngeal swab, presence of viral mutation(s) within the areas targeted by this assay, and inadequate number of viral copies(<138 copies/mL). A negative result must  be combined with clinical observations, patient history, and epidemiological information. The expected result is Negative.  Fact Sheet for Patients:  BloggerCourse.com  Fact Sheet for Healthcare Providers:  SeriousBroker.it  This test is no t yet approved or cleared by the [place] FDA and  has been authorized for detection and/or diagnosis of SARS-CoV-2 by FDA under an Emergency Use Authorization (EUA). This EUA will remain  in effect (meaning this test can be used) for the duration of the COVID-19 declaration under Section 564(b)(1) of the Act, 21 U.S.C.section 360bbb-3(b)(1), unless the authorization is terminated  or revoked sooner.       Influenza A by PCR NEGATIVE NEGATIVE   Influenza B by PCR NEGATIVE NEGATIVE    Comment: (NOTE) The Xpert Xpress SARS-CoV-2/FLU/RSV plus assay is intended as an aid in the diagnosis of influenza from Nasopharyngeal swab specimens and should not be used as a sole basis for treatment. Nasal washings and aspirates are unacceptable for Xpert Xpress SARS-CoV-2/FLU/RSV testing.  Fact Sheet for Patients: BloggerCourse.com  Fact Sheet for Healthcare Providers: SeriousBroker.it  This test is not yet approved or cleared by the [place] FDA and has been authorized for detection and/or diagnosis of SARS-CoV-2 by FDA under an Emergency Use Authorization (EUA). This EUA will remain in effect (meaning this test can be used) for the duration of the COVID-19 declaration under Section 564(b)(1) of the Act, 21 U.S.C. section 360bbb-3(b)(1), unless the authorization is terminated or revoked.  Performed at [facility], [address], [place], [place] [address]   Blood gas, arterial (at [facility])  Status: Abnormal   Collection Time: 09/17/20 11:15 PM  Result Value Ref Range   FIO2 28.00    pH, Arterial 7.410 7.350 - 7.450   pCO2 arterial 37.7  32.0 - 48.0 mmHg   pO2, Arterial 74.4 (L) 83.0 - 108.0 mmHg   Bicarbonate 24.0 20.0 - 28.0 mmol/L   Acid-base deficit 0.6 0.0 - 2.0 mmol/L   O2 Saturation 93.5 %   Patient temperature 37.0    Allens test (pass/fail) PASS PASS    Comment: Performed at [facility], [address] [place], [place] [address]  Lactic acid, plasma     Status: None   Collection Time: 09/18/20 12:01 AM  Result Value Ref Range   Lactic Acid, Venous 1.4 0.5 - 1.9 mmol/L    Comment: Performed at [facility], [address], [place], [place] [address]  Ammonia     Status: None   Collection Time: 09/18/20 12:02 AM  Result Value Ref Range   Ammonia 15 9 - 35 umol/L    Comment: Performed at [facility], [address], [place], [place] [address]  Troponin I (High Sensitivity)     Status: None   Collection Time: 09/18/20 12:08 AM  Result Value Ref Range   Troponin I (High Sensitivity) 6 <18 ng/L    Comment: (NOTE) Elevated high sensitivity troponin I (hsTnI) values and significant  changes across serial measurements may suggest ACS but many other  chronic and acute conditions are known to elevate hsTnI results.  Refer to the "Links" section for chest pain algorithms and additional  guidance. Performed at [facility], [address], [place], [place] [address]   Urine rapid drug screen (hosp performed)     Status: None   Collection Time: 09/18/20 12:20 AM  Result Value Ref Range   Opiates NONE DETECTED NONE DETECTED   Cocaine NONE DETECTED NONE DETECTED   Benzodiazepines NONE DETECTED NONE DETECTED   Amphetamines NONE DETECTED NONE DETECTED   Tetrahydrocannabinol NONE DETECTED NONE DETECTED   Barbiturates NONE DETECTED NONE DETECTED    Comment: (NOTE) DRUG SCREEN FOR MEDICAL PURPOSES ONLY.  IF CONFIRMATION IS NEEDED FOR ANY PURPOSE, NOTIFY LAB WITHIN 5 DAYS.  LOWEST DETECTABLE LIMITS FOR URINE DRUG SCREEN Drug Class                     Cutoff (ng/mL) Amphetamine and metabolites    1000 Barbiturate  and metabolites    200 Benzodiazepine                 200 Tricyclics and metabolites     300 Opiates and metabolites        300 Cocaine and metabolites        300 THC                            50 Performed at [facility], [address] [place], [place] [address]   Urinalysis, Routine w reflex microscopic Urine, Catheterized     Status: Abnormal   Collection Time: 09/18/20 12:20 AM  Result Value Ref Range   Color, Urine YELLOW YELLOW   APPearance CLEAR CLEAR   Specific Gravity, Urine 1.013 1.005 - 1.030   pH 5.0 5.0 - 8.0   Glucose, UA NEGATIVE NEGATIVE mg/dL   Hgb urine dipstick SMALL (A) NEGATIVE   Bilirubin Urine NEGATIVE NEGATIVE   Ketones, ur 20 (A) NEGATIVE mg/dL   Protein, ur NEGATIVE NEGATIVE mg/dL  Nitrite NEGATIVE NEGATIVE   Leukocytes,Ua NEGATIVE NEGATIVE   RBC / HPF 0-5 0 - 5 RBC/hpf   WBC, UA 0-5 0 - 5 WBC/hpf   Bacteria, UA NONE SEEN NONE SEEN   Squamous Epithelial / LPF 0-5 0 - 5   Mucus PRESENT     Comment: Performed at [facility], [address] [place], [place] [address]    Chemistries  Recent Labs  Lab 09/17/20 2200  NA 140  K 3.5  CL 103  CO2 25  GLUCOSE 120*  BUN 13  CREATININE 0.77  CALCIUM 9.5  AST 19  ALT 13  ALKPHOS 77  BILITOT 0.7   ------------------------------------------------------------------------------------------------------------------  ------------------------------------------------------------------------------------------------------------------ GFR: CrCl cannot be calculated (Unknown ideal weight.). Liver Function Tests: Recent Labs  Lab 09/17/20 2200  AST 19  ALT 13  ALKPHOS 77  BILITOT 0.7  PROT 7.5  ALBUMIN 4.2   No results for input(s): LIPASE, AMYLASE in the last 168 hours. Recent Labs  Lab 09/18/20 0002  AMMONIA 15   Coagulation Profile: No results for input(s): INR, PROTIME in the last 168 hours. Cardiac Enzymes: No results for input(s): CKTOTAL, CKMB, CKMBINDEX, TROPONINI in the last 168  hours. BNP (last 3 results) No results for input(s): PROBNP in the last 8760 hours. HbA1C: No results for input(s): HGBA1C in the last 72 hours. CBG: No results for input(s): GLUCAP in the last 168 hours. Lipid Profile: No results for input(s): CHOL, HDL, LDLCALC, TRIG, CHOLHDL, LDLDIRECT in the last 72 hours. Thyroid Function Tests: No results for input(s): TSH, T4TOTAL, FREET4, T3FREE, THYROIDAB in the last 72 hours. Anemia Panel: No results for input(s): VITAMINB12, FOLATE, FERRITIN, TIBC, IRON, RETICCTPCT in the last 72 hours.  --------------------------------------------------------------------------------------------------------------- Urine analysis:    Component Value Date/Time   COLORURINE YELLOW 09/18/2020 0020   APPEARANCEUR CLEAR 09/18/2020 0020   LABSPEC 1.013 09/18/2020 0020   PHURINE 5.0 09/18/2020 0020   GLUCOSEU NEGATIVE 09/18/2020 0020   HGBUR SMALL (A) 09/18/2020 0020   BILIRUBINUR NEGATIVE 09/18/2020 0020   KETONESUR 20 (A) 09/18/2020 0020   PROTEINUR NEGATIVE 09/18/2020 0020   NITRITE NEGATIVE 09/18/2020 0020   LEUKOCYTESUR NEGATIVE 09/18/2020 0020      Imaging Results:    CT Head Wo Contrast  Result Date: 09/17/2020 CLINICAL DATA:  Mental status change, found this afternoon on couch EXAM: CT HEAD WITHOUT CONTRAST TECHNIQUE: Contiguous axial images were obtained from the base of the skull through the vertex without intravenous contrast. COMPARISON:  None. FINDINGS: Brain: Foci of hypoattenuation present in the right internal capsule as as a separate focus in the right caudate and posterior limb of the left internal capsule could reflect sequela of age-indeterminate lacunar type infarcts difficult to fully assess given a background of more diffuse patchy white matter hypoattenuation typically indicative of microvascular angiopathy in a patient of this age. Additional hypoattenuation in the pons and brainstem is more nonspecific given streak artifact across  skull base. Symmetric prominence of the ventricles, cisterns and sulci compatible with parenchymal volume loss. No hyperdense hemorrhage. No mass effect or midline shift. No extra-axial collection. Scattered benign dural calcifications. Vascular: Atherosclerotic calcification of the carotid siphons. No hyperdense vessel. Skull: No calvarial fracture or suspicious osseous lesion. No scalp swelling or hematoma. Sinuses/Orbits: Paranasal sinuses and mastoid air cells are predominantly clear. Other: Included orbital structures are unremarkable. 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 Could be further characterized with MR imaging as warranted. No other acute intracranial abnormality. These results were called by telephone at the time of interpretation on 09/17/2020 at 11:45 pm to provider Benjiman Core , who verbally acknowledged these results. Electronically Signed   By: Kreg Shropshire M.D.   On: 09/17/2020 23:45   DG Chest Portable 1 View  Result Date: 09/17/2020 CLINICAL DATA:  Altered mental status EXAM: PORTABLE CHEST 1 VIEW COMPARISON:  None FINDINGS: There is hyperinflation of the lungs compatible with COPD. Heart and mediastinal contours are within normal limits. No focal opacities or effusions. No acute bony abnormality. IMPRESSION: COPD.  No active disease. Electronically Signed   By: Charlett Nose M.D.   On: 09/17/2020 23:02    My personal review of EKG: Rhythm NSR, Rate 87 /min, QTc 476 ,no Acute ST changes   Assessment & Plan:    Active Problems:   Acute metabolic encephalopathy   CVA (cerebral vascular accident) (HCC)   Leukocytosis   Essential hypertension   Acute metabolic encephalopathy Patient is obtunded Last  known normal was 624 Evidence of subacute stroke on CT TSH pending No CO2 retention on VBG Electrolytes normal Respiratory panel negative UDS negative UA negative Chest x-ray shows no active disease CT head shows subacute infarcts Troponin 6 Unclear etiology of acute metabolic encephalopathy, but most likely secondary to subacute stroke, consult neuro CVA Subacute stroke on CT Inpatient consult to neuro Monitor on telemetry, get an echo in the a.m., ultrasound carotids, MRI in the a.m. PT OT ST Continue to monitor Leukocytosis Chest x-ray, UA negative Respiratory panel negative Blood cultures pending We will repeat chest x-ray in the a.m. after overnight gentle IV hydration Trend CBC in a.m. Hypertension Patient last known normal was June 24, so she is out of permissive hypertension window Start amlodipine in the a.m. Blood pressure has spontaneously corrected from 230/99 at presentation 274/74 We will continue to monitor    DVT Prophylaxis-   Heparin- SCDs   AM Labs Ordered, also please review Full Orders  Family Communication: No family at bedside  Code Status: Full  Admission status: Inpatient :The appropriate admission status for this patient is INPATIENT. Inpatient status is judged to be reasonable and necessary in order to provide the required intensity of service to ensure the patient's safety. The patient's presenting symptoms, physical exam findings, and initial radiographic and laboratory data in the context of their chronic comorbidities is felt to place them at high risk for further clinical deterioration. Furthermore, it is not anticipated that the patient will be medically stable for discharge from the hospital within 2 midnights of admission. The following factors support the admission status of inpatient.     The patient's presenting symptoms include altered mental status. The worrisome physical exam findings include obtunded. The initial radiographic and  laboratory data are worrisome because of subacute infarcts. The chronic co-morbidities include no known medical history.       * I certify that at the point of admission it is my clinical judgment that the patient will require inpatient hospital care spanning beyond 2 midnights from the point of admission due to high intensity of service, high risk for further deterioration and high frequency of surveillance required.*  Time spent in minutes : 65   Danai Gotto B Zierle-Ghosh DO

## 2020-09-18 NOTE — ED Notes (Signed)
Hypertension reported to hospitalist.

## 2020-09-18 NOTE — Consult Note (Addendum)
[facility] Ladarrion Telfair A. Gerilyn Pilgrim, MD     [email or web address]          Kendra Lee is an 65 y.o. female.   ASSESSMENT/PLAN: 1.  Acute mentation changes/encephalopathy due to multiple bilateral ischemic strokes.  Encephalopathy is most likely due to bilateral thalamic infarcts in particular.  Etiology is most likely embolic and most likely cardioembolic.  Echocardiography will be followed.  If this is unrevealing, the patient should have a loop recorder and TEE.  Dual antiplatelet agents are recommended in the meantime. 2.  Malnourished state: Thiamine will be started. 3.  Poor dentition which could be possible source for septic emboli if turns out to be appropriate.    The patient is a 65 year old female who was found by her family members to be unresponsive.  She was last known to be normal 24th of this month.  It seems she was feeling well before this.  No prior medical history.  She is unable to provide a history.  GENERAL: This is a thin malnourished appearing female who is apparently uncomfortable but no acute distress.  HEENT: Dentition is poor.  Neck is supple no trauma noted.  Excessive secretions noted.  ABDOMEN: soft  EXTREMITIES: No edema   BACK: Normal  SKIN: Normal by inspection.    MENTAL STATUS: She lays in bed with eyes closed but opens her eyes spontaneously. [2]  She follows commands on the left side intermittently and less rarely midline commands. [2[ She occasionally follows commands involving the right leg but not the right upper extremity.  She says a few words occasionally.[1] no clear dysarthria noted but only speaks in a few sentences and almost mute. [2]  CRANIAL NERVES: Pupils are equal, round and reactive to light and accomodation; extra ocular movements are full, there is no significant nystagmus; visual fields are full; upper and lower facial muscles are normal in strength and symmetric, there is no flattening of the nasolabial folds  MOTOR:  The right upper extremity is profoundly weak graded as 2/5. [3] The other extremities are estimated to be about 4/5 with no drift in all.  COORDINATION: No tremors, dysmetria or parkinsonism noted.  No myoclonus.  REFLEXES: Deep tendon reflexes are symmetrical and normal.    SENSATION: Normal to pain bilaterally.   NIH stroke scale 2, 2,1, 2, 3 total 10.   Blood pressure (!) 206/80, pulse 62, temperature 97.9 F (36.6 C), temperature source Oral, resp. rate 18, SpO2 99 %.  No past medical history on file.   No family history on file.  Social History:  has no history on file for tobacco use, alcohol use, and drug use.  Allergies: No Known Allergies  Medications: Prior to Admission medications   Not on File    Scheduled Meds:   stroke: mapping our early stages of recovery book   Does not apply Once   [START ON 09/19/2020] amLODipine  5 mg Oral Daily   aspirin EC  81 mg Oral Daily   atorvastatin  80 mg Oral Daily   heparin  5,000 Units Subcutaneous Q8H   Continuous Infusions:  dextrose 5 % and 0.45% NaCl 75 mL/hr at 09/18/20 1632   PRN Meds:.acetaminophen **OR** acetaminophen (TYLENOL) oral liquid 160 mg/5 mL **OR** acetaminophen, hydrALAZINE     Results for orders placed or performed during the hospital encounter of 09/17/20 (from the past 48 hour(s))  Comprehensive metabolic panel     Status: Abnormal   Collection Time: 09/17/20 10:00 PM  Result Value  Ref Range   Sodium 140 135 - 145 mmol/L   Potassium 3.5 3.5 - 5.1 mmol/L   Chloride 103 98 - 111 mmol/L   CO2 25 22 - 32 mmol/L   Glucose, Bld 120 (H) 70 - 99 mg/dL    Comment: Glucose reference range applies only to samples taken after fasting for at least 8 hours.   BUN 13 8 - 23 mg/dL   Creatinine, Ser 2.90 0.44 - 1.00 mg/dL   Calcium 9.5 8.9 - 21.1 mg/dL   Total Protein 7.5 6.5 - 8.1 g/dL   Albumin 4.2 3.5 - 5.0 g/dL   AST 19 15 - 41 U/L   ALT 13 0 - 44 U/L   Alkaline Phosphatase 77 38 - 126 U/L   Total  Bilirubin 0.7 0.3 - 1.2 mg/dL   GFR, Estimated >15 >52 mL/min    Comment: (NOTE) Calculated using the CKD-EPI Creatinine Equation (2021)    Anion gap 12 5 - 15    Comment: Performed at [facility], [address] [place], [place] [address]  Ethanol     Status: None   Collection Time: 09/17/20 10:00 PM  Result Value Ref Range   Alcohol, Ethyl (B) <10 <10 mg/dL    Comment: (NOTE) Lowest detectable limit for serum alcohol is 10 mg/dL.  For medical purposes only. Performed at [facility], [address], [place], [place] [address]   CBC with Differential     Status: Abnormal   Collection Time: 09/17/20 10:00 PM  Result Value Ref Range   WBC 11.6 (H) 4.0 - 10.5 K/uL   RBC 4.80 3.87 - 5.11 MIL/uL   Hemoglobin 15.1 (H) 12.0 - 15.0 g/dL   HCT 24.4 97.5 - 30.0 %   MCV 93.8 80.0 - 100.0 fL   MCH 31.5 26.0 - 34.0 pg   MCHC 33.6 30.0 - 36.0 g/dL   RDW 51.1 02.1 - 11.7 %   Platelets 308 150 - 400 K/uL   nRBC 0.0 0.0 - 0.2 %   Neutrophils Relative % 77 %   Neutro Abs 8.9 (H) 1.7 - 7.7 K/uL   Lymphocytes Relative 16 %   Lymphs Abs 1.8 0.7 - 4.0 K/uL   Monocytes Relative 7 %   Monocytes Absolute 0.8 0.1 - 1.0 K/uL   Eosinophils Relative 0 %   Eosinophils Absolute 0.0 0.0 - 0.5 K/uL   Basophils Relative 0 %   Basophils Absolute 0.1 0.0 - 0.1 K/uL   Immature Granulocytes 0 %   Abs Immature Granulocytes 0.04 0.00 - 0.07 K/uL    Comment: Performed at [facility], [address] [place], [place] [address]  Troponin I (High Sensitivity)     Status: None   Collection Time: 09/17/20 10:00 PM  Result Value Ref Range   Troponin I (High Sensitivity) 5 <18 ng/L    Comment: (NOTE) Elevated high sensitivity troponin I (hsTnI) values and significant  changes across serial measurements may suggest ACS but many other  chronic and acute conditions are known to elevate hsTnI results.  Refer to the "Links" section for chest pain algorithms and additional  guidance. Performed at [facility], [address], [place], [place] [address]   Culture, blood (routine x 2)     Status: None (Preliminary result)   Collection Time: 09/17/20 10:00 PM   Specimen: Right Antecubital; Blood  Result Value Ref Range   Specimen Description RIGHT ANTECUBITAL    Special Requests      BOTTLES DRAWN AEROBIC  AND ANAEROBIC Blood Culture adequate volume   Culture      NO GROWTH < 12 HOURS Performed at [facility], [address], [place], [place] [address]    Report Status PENDING   Resp Panel by RT-PCR (Flu A&B, Covid) Nasopharyngeal Swab     Status: None   Collection Time: 09/17/20 10:35 PM   Specimen: Nasopharyngeal Swab; Nasopharyngeal(NP) swabs in vial transport medium  Result Value Ref Range   SARS Coronavirus 2 by RT PCR NEGATIVE NEGATIVE    Comment: (NOTE) SARS-CoV-2 target nucleic acids are NOT DETECTED.  The SARS-CoV-2 RNA is generally detectable in upper respiratory specimens during the acute phase of infection. The lowest concentration of SARS-CoV-2 viral copies this assay can detect is 138 copies/mL. A negative result does not preclude SARS-Cov-2 infection and should not be used as the sole basis for treatment or other patient management decisions. A negative result may occur with  improper specimen collection/handling, submission of specimen other than nasopharyngeal swab, presence of viral mutation(s) within the areas targeted by this assay, and inadequate number of viral copies(<138 copies/mL). A negative result must be combined with clinical observations, patient history, and epidemiological information. The expected result is Negative.  Fact Sheet for Patients:  BloggerCourse.com  Fact Sheet for Healthcare Providers:  SeriousBroker.it  This test is no t yet approved or cleared by the [place] FDA and  has been authorized for detection and/or diagnosis of SARS-CoV-2 by FDA under an Emergency Use Authorization (EUA).  This EUA will remain  in effect (meaning this test can be used) for the duration of the COVID-19 declaration under Section 564(b)(1) of the Act, 21 U.S.C.section 360bbb-3(b)(1), unless the authorization is terminated  or revoked sooner.       Influenza A by PCR NEGATIVE NEGATIVE   Influenza B by PCR NEGATIVE NEGATIVE    Comment: (NOTE) The Xpert Xpress SARS-CoV-2/FLU/RSV plus assay is intended as an aid in the diagnosis of influenza from Nasopharyngeal swab specimens and should not be used as a sole basis for treatment. Nasal washings and aspirates are unacceptable for Xpert Xpress SARS-CoV-2/FLU/RSV testing.  Fact Sheet for Patients: BloggerCourse.com  Fact Sheet for Healthcare Providers: SeriousBroker.it  This test is not yet approved or cleared by the [place] FDA and has been authorized for detection and/or diagnosis of SARS-CoV-2 by FDA under an Emergency Use Authorization (EUA). This EUA will remain in effect (meaning this test can be used) for the duration of the COVID-19 declaration under Section 564(b)(1) of the Act, 21 U.S.C. section 360bbb-3(b)(1), unless the authorization is terminated or revoked.  Performed at [facility], [address], [place], [place] [address]   Blood gas, arterial (at [facility])     Status: Abnormal   Collection Time: 09/17/20 11:15 PM  Result Value Ref Range   FIO2 28.00    pH, Arterial 7.410 7.350 - 7.450   pCO2 arterial 37.7 32.0 - 48.0 mmHg   pO2, Arterial 74.4 (L) 83.0 - 108.0 mmHg   Bicarbonate 24.0 20.0 - 28.0 mmol/L   Acid-base deficit 0.6 0.0 - 2.0 mmol/L   O2 Saturation 93.5 %   Patient temperature 37.0    Allens test (pass/fail) PASS PASS    Comment: Performed at [facility], [address], [place], [place] [address]  Lactic acid, plasma     Status: None   Collection Time: 09/18/20 12:01 AM  Result Value Ref Range   Lactic Acid, Venous 1.4 0.5 - 1.9 mmol/L  Comment:  Performed at [facility], [address] [place], [place] [address]  Culture, blood (routine x 2)     Status: None (Preliminary result)   Collection Time: 09/18/20 12:01 AM   Specimen: BLOOD LEFT ARM  Result Value Ref Range   Specimen Description BLOOD LEFT ARM    Special Requests      BOTTLES DRAWN AEROBIC AND ANAEROBIC Blood Culture adequate volume   Culture      NO GROWTH < 12 HOURS Performed at [facility], [address], [place], [place] [address]    Report Status PENDING   Ammonia     Status: None   Collection Time: 09/18/20 12:02 AM  Result Value Ref Range   Ammonia 15 9 - 35 umol/L    Comment: Performed at [facility], [address], [place], [place] [address]  TSH     Status: None   Collection Time: 09/18/20 12:02 AM  Result Value Ref Range   TSH 1.267 0.350 - 4.500 uIU/mL    Comment: Performed by a 3rd Generation assay with a functional sensitivity of <=0.01 uIU/mL. Performed at [facility], [address], [place], [place] [address]   Vitamin B12     Status: None   Collection Time: 09/18/20 12:02 AM  Result Value Ref Range   Vitamin B-12 262 180 - 914 pg/mL    Comment: (NOTE) This assay is not validated for testing neonatal or myeloproliferative syndrome specimens for Vitamin B12 levels. Performed at [facility], [address] [place], [place] [address]   Troponin I (High Sensitivity)     Status: None   Collection Time: 09/18/20 12:08 AM  Result Value Ref Range   Troponin I (High Sensitivity) 6 <18 ng/L    Comment: (NOTE) Elevated high sensitivity troponin I (hsTnI) values and significant  changes across serial measurements may suggest ACS but many other  chronic and acute conditions are known to elevate hsTnI results.  Refer to the "Links" section for chest pain algorithms and additional  guidance. Performed at [facility], [address] [place], [place] [address]   Urine rapid drug screen (hosp performed)     Status: None   Collection Time: 09/18/20  12:20 AM  Result Value Ref Range   Opiates NONE DETECTED NONE DETECTED   Cocaine NONE DETECTED NONE DETECTED   Benzodiazepines NONE DETECTED NONE DETECTED   Amphetamines NONE DETECTED NONE DETECTED   Tetrahydrocannabinol NONE DETECTED NONE DETECTED   Barbiturates NONE DETECTED NONE DETECTED    Comment: (NOTE) DRUG SCREEN FOR MEDICAL PURPOSES ONLY.  IF CONFIRMATION IS NEEDED FOR ANY PURPOSE, NOTIFY LAB WITHIN 5 DAYS.  LOWEST DETECTABLE LIMITS FOR URINE DRUG SCREEN Drug Class                     Cutoff (ng/mL) Amphetamine and metabolites    1000 Barbiturate and metabolites    200 Benzodiazepine                 200 Tricyclics and metabolites     300 Opiates and metabolites        300 Cocaine and metabolites        300 THC                            50 Performed at [facility], [address], [place], [place] [address]   Urinalysis, Routine w reflex microscopic Urine, Catheterized     Status: Abnormal  Collection Time: 09/18/20 12:20 AM  Result Value Ref Range   Color, Urine YELLOW YELLOW   APPearance CLEAR CLEAR   Specific Gravity, Urine 1.013 1.005 - 1.030   pH 5.0 5.0 - 8.0   Glucose, UA NEGATIVE NEGATIVE mg/dL   Hgb urine dipstick SMALL (A) NEGATIVE   Bilirubin Urine NEGATIVE NEGATIVE   Ketones, ur 20 (A) NEGATIVE mg/dL   Protein, ur NEGATIVE NEGATIVE mg/dL   Nitrite NEGATIVE NEGATIVE   Leukocytes,Ua NEGATIVE NEGATIVE   RBC / HPF 0-5 0 - 5 RBC/hpf   WBC, UA 0-5 0 - 5 WBC/hpf   Bacteria, UA NONE SEEN NONE SEEN   Squamous Epithelial / LPF 0-5 0 - 5   Mucus PRESENT     Comment: Performed at [facility], [address] [place], [place] [address]  Lactic acid, plasma     Status: None   Collection Time: 09/18/20  3:49 AM  Result Value Ref Range   Lactic Acid, Venous 1.2 0.5 - 1.9 mmol/L    Comment: Performed at [facility], [address] [place], [place] [address]  HIV Antibody (routine testing w rflx)     Status: None   Collection Time: 09/18/20  3:50 AM  Result  Value Ref Range   HIV Screen 4th Generation wRfx Non Reactive Non Reactive    Comment: Performed at [facility], [address] [place], [place] [address]  Lipid panel     Status: Abnormal   Collection Time: 09/18/20  3:50 AM  Result Value Ref Range   Cholesterol 217 (H) 0 - 200 mg/dL   Triglycerides 81 <914<150 mg/dL   HDL 63 >78>40 mg/dL   Total CHOL/HDL Ratio 3.4 RATIO   VLDL 16 0 - 40 mg/dL   LDL Cholesterol 295138 (H) 0 - 99 mg/dL    Comment:        Total Cholesterol/HDL:CHD Risk Coronary Heart Disease Risk Table                     Men   Women  1/2 Average Risk   3.4   3.3  Average Risk       5.0   4.4  2 X Average Risk   9.6   7.1  3 X Average Risk  23.4   11.0        Use the calculated Patient Ratio above and the CHD Risk Table to determine the patient's CHD Risk.        ATP III CLASSIFICATION (LDL):  <100     mg/dL   Optimal  [phone]  mg/dL   Near or Above                    Optimal  130-159  mg/dL   Borderline  [phone]  mg/dL   High  >962>190     mg/dL   Very High Performed at [facility], [address] [place], [place] [address]   CBC     Status: None   Collection Time: 09/18/20  3:51 AM  Result Value Ref Range   WBC 9.8 4.0 - 10.5 K/uL   RBC 4.36 3.87 - 5.11 MIL/uL   Hemoglobin 13.8 12.0 - 15.0 g/dL   HCT 13.241.0 44.036.0 - 10.246.0 %   MCV 94.0 80.0 - 100.0 fL   MCH 31.7 26.0 - 34.0 pg   MCHC 33.7 30.0 - 36.0 g/dL   RDW 72.513.4 36.611.5 - 44.015.5 %   Platelets 301 150 -  400 K/uL   nRBC 0.0 0.0 - 0.2 %    Comment: Performed at [facility], [address], [place], [place] [address]  Comprehensive metabolic panel     Status: Abnormal   Collection Time: 09/18/20  3:51 AM  Result Value Ref Range   Sodium 140 135 - 145 mmol/L   Potassium 3.6 3.5 - 5.1 mmol/L   Chloride 106 98 - 111 mmol/L   CO2 25 22 - 32 mmol/L   Glucose, Bld 149 (H) 70 - 99 mg/dL    Comment: Glucose reference range applies only to samples taken after fasting for at least 8 hours.   BUN 15 8 - 23 mg/dL    Creatinine, Ser 6.04 0.44 - 1.00 mg/dL   Calcium 9.2 8.9 - 54.0 mg/dL   Total Protein 6.6 6.5 - 8.1 g/dL   Albumin 3.7 3.5 - 5.0 g/dL   AST 16 15 - 41 U/L   ALT 12 0 - 44 U/L   Alkaline Phosphatase 66 38 - 126 U/L   Total Bilirubin 0.6 0.3 - 1.2 mg/dL   GFR, Estimated >98 >11 mL/min    Comment: (NOTE) Calculated using the CKD-EPI Creatinine Equation (2021)    Anion gap 9 5 - 15    Comment: Performed at [facility], [address], [place], [place] [address]  Magnesium     Status: None   Collection Time: 09/18/20  3:51 AM  Result Value Ref Range   Magnesium 2.1 1.7 - 2.4 mg/dL    Comment: Performed at [facility], [address], [place], [place] [address]    Studies/Results: BRAIN MRI MRA FINDINGS: MRI HEAD FINDINGS   Brain: Restricted diffusion in the bilateral thalamus, bilateral internal capsule, left centrum semiovale, and right subcortical frontal. These have the appearance of acute infarcts. Mild small vessel ischemic type change in the pons and hemispheric white matter. No evidence of hemorrhage, hydrocephalus, or collection.   Vascular: Preserved flow voids.   Skull and upper cervical spine: Normal marrow signal   Sinuses/Orbits: Negative   Other: Significant and progressive motion artifact   MRA HEAD FINDINGS   Very motion degraded. Carotid, vertebral, and basilar arteries and major branches are patent with gross symmetry.   IMPRESSION: Brain MRI:   1. 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 These are not in a unified arterial or venous distribution and are of uncertain cause. No revealing background chronic brain findings. 2. Significantly motion degraded   Intracranial MRA:   Very motion degraded and of limited utility other than documenting patency of major vessels.      The brain MRI scan and MRA are both reviewed in person.  There are multiple increased  signal on DWI with the mostly corresponding reduced signal on the ADC scan consistent with acute ischemic stroke.  These are seen in the thalami bilaterally, internal capsule bilaterally right centrum semiovale, right frontal lobe and a few involving the left frontal lobe.  The constellation of findings are consistent with embolism most likely cardioembolism.  There is mild confluent chronic white matter periventricular changes noted.  No hemorrhages noted.  No intracranial occlusive disease.    Timur Nibert A. Gerilyn Pilgrim, M.D.  Diplomate, Biomedical engineer of Psychiatry and Neurology ( Neurology). 09/18/2020, 6:14 PM

## 2020-09-18 NOTE — Progress Notes (Signed)
SLP Cancellation Note  Patient Details Name: Koraline Phillipson MRN: [id number] DOB: 07-31-1955   Cancelled treatment:       Reason Eval/Treat Not Completed: Other (comment) (Consult received for speech/language evaluation; will complete tomorrow. No swallow evaluation ordered.)   Thank you,  [name], CCC-SLP [phone]   Talynn Lebon 09/18/2020, 4:43 PM

## 2020-09-18 NOTE — Progress Notes (Addendum)
PROGRESS NOTE    Patient: Kendra Lee                            PCP: Pcp, No                    DOB: 02-01-1956            DOA: 09/17/2020 [id number]:[id number]             DOS: 09/18/2020, 10:52 AM   LOS: 0 days   Date of Service: The patient was seen and examined on 09/18/2020  Subjective:   The patient was seen and examined this morning. Hypertensive otherwise hemodynamically stable Remains somnolent, with pain stimuli moves her left hand and legs nonverbal at this time  Otherwise no issues overnight .  Brief Narrative:   Kendra Lee  is a 65 y.o. female, with no known medical history, who presents to the ED with altered mental status. History is quite limited 2/2 patient's non verbal status. Earlier, family was able to provide some history to the ED provider. They reported that the patient's last known normal was 6/24. She has been somnolent, and not taking care of herself in that interval. Family was not aware of an EtOH or illicit drug use. They are not aware of an trauma, and there are not signs of trauma on exam. Family reported no infectious symptoms or fevers.  ED: Vitals stable, encephalopathic, CT head shows focal regions of hypoattenuation seen in the genu of the right internal capsule, posterior limb of the left internal capsule and right thalamus which could reflect age-indeterminate infarction.  No other acute abnormality  Assessment & Plan:   Active Problems:   Acute metabolic encephalopathy   CVA (cerebral vascular accident) (HCC)   Leukocytosis   Essential hypertension   Acute metabolic encephalopathy -Related to current stroke -CT and MRI of the head obtained reviewed, revealing multiple thalamic stroke -Hemodynamically stable, -CBC CMP within normal limits, TSH normal,, normal CO2 on VBG, -Urine drug screen negative, UA negative -Chest x-ray consistent with COPD no acute infiltrate -Troponins normal -Continue neurochecks -We will continue  investigating any other source of encephalopathy other than stroke  Acute embolic stroke  -Remained encephalopathic -CT of the head revealing right internal capsule, posterior limb of left internal capsule and right thalamic lesions -MRI of the brain: Scattered small vessel infarcts in the bilateral thalamus,bilateral internal capsule, and bilateral hemispheric white matter.These are not in a unified arterial or venous distribution and are of uncertain cause. No revealing background chronic brain findings. 2. Significantly motion degraded  -2D echocardiogram:     -US carotid bilateral :IMPRESSION: 1. 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 2. Less than 50% stenosis of the left internal carotid artery.  -PT/OT/speech consultation pending evaluation -High-dose statin, aspirin initiated -Neurology consulted -appreciate input Anticipating full dose statins, Third platelet therapy once patient can tolerate p.o.  -Continue monitoring on telemetry floor  Leukocytosis -Likely reactive, UA negative, chest x-ray unimpressive -Respiratory panel negative -Afebrile normotensive no signs of sepsis or overt infection   Hypertension -For now allowing permissive hypertension -Anticipate restarting home medication of amlodipine -Next 24-48 hours will initiate BP meds, along with hydralazine slowly bringing blood pressure down    --------------------------------------------------------------------------------------------------------------------------------- Nutritional status:  The patient's BMI is: There is no height or weight on file to calculate BMI. I agree with the assessment and plan as outlined --------------------------------------------------------------------------------------------------------------------------------- Cultures; None  Antimicrobials:  none   Consultants:   Neurologist PT/OT/speech   ----------------------------------------------------------------------------------------------------------------------------------  DVT prophylaxis:  SCD/Compression stockings Code Status:   Code Status: Full Code  Family Communication: Discussed with the patient's son at bedside The above findings and plan of care has been discussed with patient (and family)  in detail,  they expressed understanding and agreement of above. -Advance care planning has been discussed.   Admission status:   Status is: Inpatient  Remains inpatient appropriate because:Inpatient level of care appropriate due to severity of illness  Dispo: The patient is from: Home              Anticipated d/c is to:  SNF VS home with home health              Patient currently is not medically stable to d/c.  Continue for evaluation stroke, consultant input, determining disposition   Difficult to place patient No      Level of care: Telemetry   Procedures:   No admission procedures for hospital encounter.  MRI of the brain CT of the head 2D echocardiogram  Antimicrobials:  Anti-infectives (From admission, onward)    None        Medication:    stroke: mapping our early stages of recovery book   Does not apply Once   [START ON 09/19/2020] amLODipine  5 mg Oral Daily   aspirin EC  81 mg Oral Daily   atorvastatin  80 mg Oral Daily   heparin  5,000 Units Subcutaneous Q8H    acetaminophen **OR** acetaminophen (TYLENOL) oral liquid 160 mg/5 mL **OR** acetaminophen   Objective:   Vitals:   09/18/20 0930 09/18/20 1000 09/18/20 1030 09/18/20 1047  BP: (!) 213/95 (!) 201/80 (!) 187/77 (!) 187/77  Pulse: 66 66 61 65  Resp: Temp:      TempSrc:      SpO2: 96% 95% 96% 99%   No intake or output data in the 24 hours ending 09/18/20 1052 There were no vitals filed for this visit.   Examination:   Physical Exam  Constitution: Elderly cachectic female -  encephalopathic, somnolent, lethargic nonverbal, withdraws to pain on the left side right arm flaccid does not withdraw to pain, asymmetric facial features no distress,  Appears calm and comfortable  Psychiatric: Normal and stable mood and affect, cognition intact,   HEENT: Normocephalic, PERRL, otherwise with in Normal limits  Chest:Chest symmetric Cardio vascular:  S1/S2, RRR, No murmure, No Rubs or Gallops  pulmonary: Clear to auscultation bilaterally, respirations unlabored, negative wheezes / crackles Abdomen: Soft, non-tender, non-distended, bowel sounds,no masses, no organomegaly Muscular skeletal: Limited exam - in bed, able to move all 4 extremities, Normal strength,  Neuro: Facial asymmetry, nonverbal, lethargic, somnolent dense right arm weakness with neglect withdraws both legs to pain stimuli Able to move arm and withdraw to pain  extremities: Right-sided weakness, no pitting edema lower extremities, +2 pulses  Skin: Dry, warm to touch, negative for any Rashes, No open wounds Wounds: per nursing documentation    ------------------------------------------------------------------------------------------------------------------------------------------    LABs:  CBC Latest Ref Rng & Units 09/18/2020 09/17/2020  WBC 4.0 - 10.5 K/uL 9.8 11.6(H)  Hemoglobin 12.0 - 15.0 g/dL 16.1 15.1(H)  Hematocrit 36.0 - 46.0 % 41.0 45.0  Platelets 150 - 400 K/uL 301 308   CMP Latest Ref Rng & Units 09/18/2020 09/17/2020  Glucose 70 - 99 mg/dL 096(E) 454(U)  BUN 8 - 23 mg/dL 15 13  Creatinine 9.81 - 1.00  mg/dL 0.45 4.09  Sodium 811 - 145 mmol/L 140 140  Potassium 3.5 - 5.1 mmol/L 3.6 3.5  Chloride 98 - 111 mmol/L 106 103  CO2 22 - 32 mmol/L 25 25  Calcium 8.9 - 10.3 mg/dL 9.2 9.5  Total Protein 6.5 - 8.1 g/dL 6.6 7.5  Total Bilirubin 0.3 - 1.2 mg/dL 0.6 0.7  Alkaline Phos 38 - 126 U/L 66 77  AST 15 - 41 U/L 16 19  ALT 0 - 44 U/L 12 13       Micro Results Recent Results (from the past  240 hour(s))  Culture, blood (routine x 2)     Status: None (Preliminary result)   Collection Time: 09/17/20 10:00 PM   Specimen: Right Antecubital; Blood  Result Value Ref Range Status   Specimen Description RIGHT ANTECUBITAL  Final   Special Requests   Final    BOTTLES DRAWN AEROBIC AND ANAEROBIC Blood Culture adequate volume   Culture   Final    NO GROWTH < 12 HOURS Performed at [facility], [address] [place], [place] [address]    Report Status PENDING  Incomplete  Resp Panel by RT-PCR (Flu A&B, Covid) Nasopharyngeal Swab     Status: None   Collection Time: 09/17/20 10:35 PM   Specimen: Nasopharyngeal Swab; Nasopharyngeal(NP) swabs in vial transport medium  Result Value Ref Range Status   SARS Coronavirus 2 by RT PCR NEGATIVE NEGATIVE Final    Comment: (NOTE) SARS-CoV-2 target nucleic acids are NOT DETECTED.  The SARS-CoV-2 RNA is generally detectable in upper respiratory specimens during the acute phase of infection. The lowest concentration of SARS-CoV-2 viral copies this assay can detect is 138 copies/mL. A negative result does not preclude SARS-Cov-2 infection and should not be used as the sole basis for treatment or other patient management decisions. A negative result may occur with  improper specimen collection/handling, submission of specimen other than nasopharyngeal swab, presence of viral mutation(s) within the areas targeted by this assay, and inadequate number of viral copies(<138 copies/mL). A negative result must be combined with clinical observations, patient history, and epidemiological information. The expected result is Negative.  Fact Sheet for Patients:  BloggerCourse.com  Fact Sheet for Healthcare Providers:  SeriousBroker.it  This test is no t yet approved or cleared by the [place] FDA and  has been authorized for detection and/or diagnosis of SARS-CoV-2 by FDA under an Emergency Use  Authorization (EUA). This EUA will remain  in effect (meaning this test can be used) for the duration of the COVID-19 declaration under Section 564(b)(1) of the Act, 21 U.S.C.section 360bbb-3(b)(1), unless the authorization is terminated  or revoked sooner.       Influenza A by PCR NEGATIVE NEGATIVE Final   Influenza B by PCR NEGATIVE NEGATIVE Final    Comment: (NOTE) The Xpert Xpress SARS-CoV-2/FLU/RSV plus assay is intended as an aid in the diagnosis of influenza from Nasopharyngeal swab specimens and should not be used as a sole basis for treatment. Nasal washings and aspirates are unacceptable for Xpert Xpress SARS-CoV-2/FLU/RSV testing.  Fact Sheet for Patients: BloggerCourse.com  Fact Sheet for Healthcare Providers: SeriousBroker.it  This test is not yet approved or cleared by the [place] FDA and has been authorized for detection and/or diagnosis of SARS-CoV-2 by FDA under an Emergency Use Authorization (EUA). This EUA will remain in effect (meaning this test can be used) for the duration of the COVID-19 declaration under Section 564(b)(1) of the Act, 21 U.S.C. section 360bbb-3(b)(1), unless the  authorization is terminated or revoked.  Performed at [facility], [address] [place], [place] [address]   Culture, blood (routine x 2)     Status: None (Preliminary result)   Collection Time: 09/18/20 12:01 AM   Specimen: BLOOD LEFT ARM  Result Value Ref Range Status   Specimen Description BLOOD LEFT ARM  Final   Special Requests   Final    BOTTLES DRAWN AEROBIC AND ANAEROBIC Blood Culture adequate volume   Culture   Final    NO GROWTH < 12 HOURS Performed at [facility], [address], [place], [place] [address]    Report Status PENDING  Incomplete    Radiology Reports CT Head Wo Contrast  Result Date: 09/17/2020 CLINICAL DATA:  Mental status change, found this afternoon on couch EXAM: CT HEAD WITHOUT  CONTRAST TECHNIQUE: Contiguous axial images were obtained from the base of the skull through the vertex without intravenous contrast. COMPARISON:  None. FINDINGS: Brain: Foci of hypoattenuation present in the right internal capsule as as a separate focus in the right caudate and posterior limb of the left internal capsule could reflect sequela of age-indeterminate lacunar type infarcts difficult to fully assess given a background of more diffuse patchy white matter hypoattenuation typically indicative of microvascular angiopathy in a patient of this age. Additional hypoattenuation in the pons and brainstem is more nonspecific given streak artifact across skull base. Symmetric prominence of the ventricles, cisterns and sulci compatible with parenchymal volume loss. No hyperdense hemorrhage. No mass effect or midline shift. No extra-axial collection. Scattered benign dural calcifications. Vascular: Atherosclerotic calcification of the carotid siphons. No hyperdense vessel. Skull: No calvarial fracture or suspicious osseous lesion. No scalp swelling or hematoma. Sinuses/Orbits: Paranasal sinuses and mastoid air cells are predominantly clear. Other: Included orbital structures are unremarkable. 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 Could be further characterized with MR imaging as warranted. No other acute intracranial abnormality. These results were called by telephone at the time of interpretation on 09/17/2020 at 11:45 pm to provider Benjiman Core , who verbally acknowledged these results. Electronically Signed   By: Kreg Shropshire M.D.   On: 09/17/2020 23:45   MR ANGIO HEAD WO CONTRAST  Result Date: 09/18/2020 CLINICAL DATA:  Altered  mental status. EXAM: MRI HEAD WITHOUT CONTRAST MRA HEAD WITHOUT CONTRAST TECHNIQUE: Multiplanar, multi-echo pulse sequences of the brain and surrounding structures were acquired without intravenous contrast. Angiographic images of the Circle of Willis were acquired using MRA technique without intravenous contrast. COMPARISON:  No pertinent prior exam. FINDINGS: MRI HEAD FINDINGS Brain: Restricted diffusion in the bilateral thalamus, bilateral internal capsule, left centrum semiovale, and right subcortical frontal. These have the appearance of acute infarcts. Mild small vessel ischemic type change in the pons and hemispheric white matter. No evidence of hemorrhage, hydrocephalus, or collection. Vascular: Preserved flow voids. Skull and upper cervical spine: Normal marrow signal Sinuses/Orbits: Negative Other: Significant and progressive motion artifact MRA HEAD FINDINGS Very motion degraded. Carotid, vertebral, and basilar arteries and major branches are patent with gross symmetry. IMPRESSION: Brain MRI: 1. 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 These are not in a unified arterial or venous distribution and are of uncertain cause. No revealing background chronic brain findings. 2.  Significantly motion degraded Intracranial MRA: Very motion degraded and of limited utility other than documenting patency of major vessels. Electronically Signed   By: Marnee Spring M.D.   On: 09/18/2020 08:43   MR BRAIN WO CONTRAST  Result Date: 09/18/2020 CLINICAL DATA:  Altered mental status. EXAM: MRI HEAD WITHOUT CONTRAST MRA HEAD WITHOUT CONTRAST TECHNIQUE: Multiplanar, multi-echo pulse sequences of the brain and surrounding structures were acquired without intravenous contrast. Angiographic images of the Circle of Willis were acquired using MRA technique without intravenous contrast. COMPARISON:  No pertinent prior exam. FINDINGS: MRI HEAD  FINDINGS Brain: Restricted diffusion in the bilateral thalamus, bilateral internal capsule, left centrum semiovale, and right subcortical frontal. These have the appearance of acute infarcts. Mild small vessel ischemic type change in the pons and hemispheric white matter. No evidence of hemorrhage, hydrocephalus, or collection. Vascular: Preserved flow voids. Skull and upper cervical spine: Normal marrow signal Sinuses/Orbits: Negative Other: Significant and progressive motion artifact MRA HEAD FINDINGS Very motion degraded. Carotid, vertebral, and basilar arteries and major branches are patent with gross symmetry. IMPRESSION: Brain MRI: 1. 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 These are not in a unified arterial or venous distribution and are of uncertain cause. No revealing background chronic brain findings. 2. Significantly motion degraded Intracranial MRA: Very motion degraded and of limited utility other than documenting patency of major vessels. Electronically Signed   By: Marnee Spring M.D.   On: 09/18/2020 08:43   US Carotid Bilateral (at [facility] and [facility] only)  Result Date: 09/18/2020 CLINICAL DATA:  Altered mental status Hypertension Stroke EXAM: BILATERAL CAROTID DUPLEX ULTRASOUND TECHNIQUE: Wallace Cullens scale imaging, color Doppler and duplex ultrasound were performed of bilateral carotid and vertebral arteries in the neck. COMPARISON:  None. FINDINGS: Criteria: Quantification of carotid stenosis is based on velocity parameters that correlate the residual internal carotid diameter with NASCET-based stenosis levels, using the diameter of the distal internal carotid lumen as the denominator for stenosis measurement. The following velocity measurements were obtained: RIGHT ICA: 156/29 cm/sec CCA: 77/11 cm/sec SYSTOLIC ICA/CCA RATIO:  2.0 ECA: 173 cm/sec LEFT ICA: 83/18 cm/sec CCA: 85/14 cm/sec SYSTOLIC ICA/CCA RATIO:  1.0 ECA:  92 cm/sec RIGHT CAROTID ARTERY: Heterogeneous calcified and noncalcified plaque of the distal common and proximal internal carotid arteries with velocity indicative of 50-69% stenosis. RIGHT VERTEBRAL ARTERY:  Antegrade flow. LEFT CAROTID ARTERY: Heterogeneous calcified and noncalcified plaque of the distal common and proximal internal carotid arteries with velocity parameters indicative of less than 50% stenosis. LEFT VERTEBRAL ARTERY:  Antegrade flow. IMPRESSION: 1. 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 2. Less than 50% stenosis of the left internal carotid artery. 3. Long segment shadowing plaque present in both common and internal carotid arteries, which can obscure higher velocities. Further evaluation with MRI or CT of the neck should be performed for more precise evaluation of degree of stenosis. Electronically Signed   By: Acquanetta Belling M.D.   On: 09/18/2020 10:01   DG Chest Portable 1 View  Result Date: 09/17/2020 CLINICAL DATA:  Altered mental status EXAM: PORTABLE CHEST 1 VIEW COMPARISON:  None FINDINGS: There is hyperinflation of the lungs compatible with COPD. Heart and mediastinal contours are within normal limits. No focal opacities or effusions. No acute bony abnormality. IMPRESSION: COPD.  No active disease. Electronically Signed   By: Charlett Nose M.D.   On: 09/17/2020 23:02    SIGNED: Kendell Bane, MD, FHM. [facility],  Pager (please use [email or web address] to page/text) Please use Epic Secure Chat for non-urgent communication (7AM-7PM)  If 7PM-7AM, please contact night-coverage [email or web address], 09/18/2020, 10:52 AM

## 2020-09-18 NOTE — ED Provider Notes (Signed)
Care assumed from Dr. Rubin Payor. Discussed with patient's son at bedside.Marland Kitchen  She was last seen normal on the evening of June 24.  She has not been herself for the past 2 days lying on the couch and not taking care of herself and not following commands.  On arrival he does not recognize her family members. Patient does not have any regular medical conditions and is not taking her regular medications.  She does not see a doctor. Family is not aware of any alcohol or drug use.  They are not aware of any trauma.  No recent fever or other infectious symptoms  Patient appears to have right-sided weakness more in her arm and her leg.  She is quite obtunded with no response to Narcan. Some drooling and harsh breath sounds.  Not able to give much history.  ABG shows no significant CO2 retention.  CT is questionable for subacute infarcts but no hemorrhage. Patient does not move her right arm less than her other extremities and appears to have a new right-sided facial droop.  Not tPA candidate due to delayed presentation.  Appears her mental status changes greater than 48 hours ago.  Urinalysis is negative for infection.  Chest x-ray shows COPD.  ABG without significant CO2 retention.  Drug screen is negative.  Admission discussed with Dr. Carren Rang.    Glynn Octave, MD 09/18/20 [phone]

## 2020-09-18 NOTE — ED Notes (Signed)
Unable to get SSS at this time.

## 2020-09-19 ENCOUNTER — Encounter (HOSPITAL_COMMUNITY): Payer: Self-pay | Admitting: Family Medicine

## 2020-09-19 DIAGNOSIS — Z7189 Other specified counseling: Secondary | ICD-10-CM

## 2020-09-19 DIAGNOSIS — Z515 Encounter for palliative care: Secondary | ICD-10-CM

## 2020-09-19 DIAGNOSIS — D72829 Elevated white blood cell count, unspecified: Secondary | ICD-10-CM | POA: Diagnosis not present

## 2020-09-19 DIAGNOSIS — G9341 Metabolic encephalopathy: Secondary | ICD-10-CM | POA: Diagnosis not present

## 2020-09-19 DIAGNOSIS — I639 Cerebral infarction, unspecified: Secondary | ICD-10-CM | POA: Diagnosis not present

## 2020-09-19 DIAGNOSIS — I10 Essential (primary) hypertension: Secondary | ICD-10-CM | POA: Diagnosis not present

## 2020-09-19 LAB — HEMOGLOBIN A1C
Hgb A1c MFr Bld: 5.5 % (ref 4.8–5.6)
Mean Plasma Glucose: 111 mg/dL

## 2020-09-19 LAB — RPR: RPR Ser Ql: NONREACTIVE

## 2020-09-19 MED ORDER — ORAL CARE MOUTH RINSE
15.0000 mL | Freq: Two times a day (BID) | OROMUCOSAL | Status: DC
  Administered 2020-09-19 – 2020-10-09 (×32): 15 mL via OROMUCOSAL

## 2020-09-19 MED ORDER — CYANOCOBALAMIN 1000 MCG/ML IJ SOLN
1000.0000 ug | Freq: Once | INTRAMUSCULAR | Status: AC
  Administered 2020-09-19: 1000 ug via INTRAMUSCULAR
  Filled 2020-09-19: qty 1

## 2020-09-19 NOTE — Evaluation (Signed)
Clinical/Bedside Swallow Evaluation Patient Details  Name: Kendra Lee MRN: [id number] Date of Birth: May 07, 1955  Today's Date: 09/19/2020 Time: SLP Start Time (ACUTE ONLY): 1410 SLP Stop Time (ACUTE ONLY): 1432 SLP Time Calculation (min) (ACUTE ONLY): 22 min  Past Medical History: History reviewed. No pertinent past medical history. Past Surgical History: History reviewed. No pertinent surgical history. HPI:  65 y.o. female  with past medical history of no known medical history admitted on 09/17/2020 with acute metabolic encephalopathy, CVA acute embolic stroke. MRI shows Scattered small vessel infarcts in the bilateral thalamus,   bilateral internal capsule, and bilateral hemispheric white matter.   Assessment / Plan / Recommendation Clinical Impression  Pt currently presents with suspected severe dysphagia, however she does elicit swallows (audibly wet and delayed coughing). Pt with pooled oral secretions and limited mandibular and lingual movement. Her teeth have severe decay and are predominantly grey and black with severe gum recession and foul odor. She did allow oral care and ice chip presentations and is attempting to swallow. She answered simple questions related to her garden (grows squash and green beans) and followed one step commands. She is unable to move her right hand at this time. Recommend continue NPO with oral care and suction as needed with SLP to check back again tomorrow. RN in agreement with plan of care. SLP Visit Diagnosis: Dysphagia, unspecified (R13.10)    Aspiration Risk  Severe aspiration risk;Risk for inadequate nutrition/hydration    Diet Recommendation NPO   Medication Administration: Via alternative means    Other  Recommendations Oral Care Recommendations: Oral care QID;Staff/trained caregiver to provide oral care Other Recommendations: Have oral suction available   Follow up Recommendations Skilled Nursing facility      Frequency and Duration min  3x week  2 weeks       Prognosis Prognosis for Safe Diet Advancement: Guarded Barriers to Reach Goals: Severity of deficits      Swallow Study   General Date of Onset: 09/17/20 HPI: 65 y.o. female  with past medical history of no known medical history admitted on 09/17/2020 with acute metabolic encephalopathy, CVA acute embolic stroke. MRI shows Scattered small vessel infarcts in the bilateral thalamus,   bilateral internal capsule, and bilateral hemispheric white matter. Type of Study: Bedside Swallow Evaluation Diet Prior to this Study: NPO Temperature Spikes Noted: No Respiratory Status: Room air History of Recent Intubation: No Behavior/Cognition: Alert;Cooperative;Requires cueing Oral Cavity Assessment: Excessive secretions (poor dentition with heavy decay) Oral Care Completed by SLP: Yes Oral Cavity - Dentition: Poor condition Vision: Impaired for self-feeding Self-Feeding Abilities: Total assist Patient Positioning: Upright in bed Baseline Vocal Quality: Normal;Low vocal intensity;Wet Volitional Cough: Congested;Weak Volitional Swallow: Able to elicit    Oral/Motor/Sensory Function Overall Oral Motor/Sensory Function: Moderate impairment Facial ROM: Reduced right Facial Symmetry: Abnormal symmetry right Lingual ROM:  (difficult to assess)   Ice Chips Ice chips: Impaired Presentation: Spoon Oral Phase Impairments: Reduced lingual movement/coordination;Poor awareness of bolus Oral Phase Functional Implications: Prolonged oral transit;Oral holding Pharyngeal Phase Impairments: Suspected delayed Swallow;Cough - Delayed (wet/audible swallow)   Thin Liquid Thin Liquid: Not tested    Nectar Thick Nectar Thick Liquid: Not tested   Honey Thick Honey Thick Liquid: Not tested   Puree Puree: Not tested   Solid     Solid: Not tested     Thank you,  [name], CCC-SLP [phone]  Clint Strupp 09/19/2020,5:53 PM

## 2020-09-19 NOTE — Progress Notes (Signed)
OT Cancellation Note  Patient Details Name: Kendra Lee MRN: [id number] DOB: 06/10/1955   Cancelled Treatment:    Reason Eval/Treat Not Completed: Patient not medically ready. Pt resting BP 201/84. Not medically ready at this time. Will attempt to evaluate pt later as time permits.   Alexsandro Salek OT, MOT  Danie Chandler 09/19/2020, 9:38 AM

## 2020-09-19 NOTE — Evaluation (Signed)
Occupational Therapy Evaluation Patient Details Name: Kendra Lee MRN: [id number] DOB: 1956/03/21 Today's Date: 09/19/2020    History of Present Illness Kendra Lee  is a 65 y.o. female, with no known medical history, who presents to the ED with altered mental status. History is quite limited 2/2 patient's non verbal status. Earlier, family was able to provide some history to the ED provider. They reported that the patient's last known normal was 6/24. She has been somnolent, and not taking care of herself in that interval. Family was not aware of an EtOH or illicit drug use. They are not aware of an trauma, and there are not signs of trauma on exam. Family reported no infectious symptoms or fevers.   Clinical Impression   Pt seen with PT for co-evaluation today. Pt able to nod or shake head appropriately to questions however unable to verbalize during conversation. Pt with RUE flaccidity, slight tone developing in digits. Pt able to wipe mouth and point with LUE. PLOF unknown. Recommend SNF on discharge for improved safety and independence in ADL completion. Will continue to follow while in acute care.     Follow Up Recommendations  SNF    Equipment Recommendations  None recommended by OT       Precautions / Restrictions Precautions Precautions: Fall Restrictions Weight Bearing Restrictions: No      Mobility Bed Mobility Overal bed mobility: Needs Assistance Bed Mobility: Supine to Sit;Sit to Supine     Supine to sit: Min assist;Mod assist;HOB elevated Sit to supine: Min assist   General bed mobility comments: min/mod assist to upright trunk    Transfers                 General transfer comment: not completed        ADL either performed or assessed with clinical judgement   ADL Overall ADL's : Needs assistance/impaired     Grooming: Wash/dry face;Oral care;Moderate assistance;Sitting;Bed level Grooming Details (indicate cue type and reason): pt able  to wipe face/mouth with a washcloth when prompted. OT using suction for mouth                               General ADL Comments: Pt limited in ADL completion due to RUE flaccidity and general lethargy     Vision Baseline Vision/History:  (unknown) Patient Visual Report: Other (comment) (unable to report)       Perception     Praxis      Pertinent Vitals/Pain Pain Assessment: No/denies pain     Hand Dominance  (unknown)   Extremity/Trunk Assessment Upper Extremity Assessment Upper Extremity Assessment: RUE deficits/detail RUE Deficits / Details: RUE is flaccid, P/ROM is WNL RUE Sensation: WNL RUE Coordination: decreased gross motor;decreased fine motor   Lower Extremity Assessment Lower Extremity Assessment: Defer to PT evaluation   Cervical / Trunk Assessment Cervical / Trunk Assessment: Normal   Communication Communication Communication: Expressive difficulties   Cognition Arousal/Alertness: Lethargic Behavior During Therapy: Flat affect Overall Cognitive Status: No family/caregiver present to determine baseline cognitive functioning                                 General Comments: Pt is able to nod or shake head appropriately to questions              Home Living  Additional Comments: Unknown as patient non-verbal      Prior Functioning/Environment          Comments: Unknown as patient non-verbal        OT Problem List: Decreased strength;Decreased activity tolerance;Impaired balance (sitting and/or standing);Decreased coordination;Decreased safety awareness;Decreased knowledge of use of DME or AE;Impaired UE functional use;Impaired tone      OT Treatment/Interventions: Self-care/ADL training;Therapeutic exercise;Neuromuscular education;DME and/or AE instruction;Therapeutic activities;Patient/family education    OT Goals(Current goals can be found in the care plan  section) Acute Rehab OT Goals Patient Stated Goal: none stated OT Goal Formulation: Patient unable to participate in goal setting Time For Goal Achievement: 10/03/20 Potential to Achieve Goals: Good ADL Goals Pt Will Perform Grooming: with supervision;sitting;standing Pt Will Perform Upper Body Dressing: with min assist;sitting;standing Pt Will Transfer to Toilet: with mod assist;stand pivot transfer;ambulating;regular height toilet;bedside commode Pt Will Perform Toileting - Clothing Manipulation and hygiene: with min assist;sitting/lateral leans;sit to/from stand Pt/caregiver will Perform Home Exercise Program: Increased ROM;Increased strength;Both right and left upper extremity;With Supervision;With written HEP provided  OT Frequency: Min 2X/week           Co-evaluation PT/OT/SLP Co-Evaluation/Treatment: Yes Reason for Co-Treatment: Complexity of the patient's impairments (multi-system involvement);For patient/therapist safety;To address functional/ADL transfers   OT goals addressed during session: ADL's and self-care;Proper use of Adaptive equipment and DME         End of Session Equipment Utilized During Treatment: Oxygen  Activity Tolerance: Patient limited by lethargy Patient left: in bed;with call bell/phone within reach;with bed alarm set  OT Visit Diagnosis: Muscle weakness (generalized) (M62.81)                Time: 1140-1155 OT Time Calculation (min): 15 min Charges:  OT General Charges $OT Visit: 1 Visit OT Evaluation $OT Eval Moderate Complexity: 1 [address] OTR/L  [phone] 09/19/2020, 4:30 PM

## 2020-09-19 NOTE — Progress Notes (Signed)
PT Cancellation Note  Patient Details Name: Kendra Lee MRN: [id number] DOB: 1955-05-09   Cancelled Treatment:    Reason Eval/Treat Not Completed: Medical issues which prohibited therapy; Patient with resting BP 201/84, will check back later if time permits.  8:41 AM, 09/19/20 Wyman Songster PT, DPT Physical Therapist at [facility]

## 2020-09-19 NOTE — Consult Note (Signed)
Consultation Note Date: 09/19/2020   Patient Name: Kendra Lee  DOB: 1955/03/29  MRN: [id number]  Age / Sex: 65 y.o., female  PCP: Pcp, No Referring Physician: Kendell Bane, MD  Reason for Consultation: Establishing goals of care and Psychosocial/spiritual support  HPI/Patient Profile: 65 y.o. female  with past medical history of no known medical history admitted on 09/17/2020 with acute metabolic encephalopathy, CVA acute embolic stroke.   Clinical Assessment and Goals of Care: I have reviewed medical records including EPIC notes, labs and imaging, received report from RN, assessed the patient.  Kendra Lee is lying quietly in bed.  She appears much older than stated age.  She will attempt to open her eyes when I call her name and touch her arm, but does not have any meaningful interaction at this time.  She is unable to make her basic needs known.  There is no family at bedside at this time.    Call to son, Kendra Lee, to discuss diagnosis prognosis, GOC, EOL wishes, disposition and options.  At that I introduced Palliative Medicine as specialized medical care for people living with serious illness. It focuses on providing relief from the symptoms and stress of a serious illness. The goal is to improve quality of life for both the patient and the family.  We discussed a brief life review of the patient.  Kendra Lee is legally separated, but her husband would not be a healthcare surrogate.  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  The family lives [place] property and Kendra Lee manages to care for all 3 of their vegetable gardens.  Kendra Lee that Kendra Lee never had a regular doctor and only went to see a doctor when she was sick.  She was not seeing a PCP.  We talked about her acute health concern of stroke.  We talked about neurology visit and results.  We talked  about the treatment plan and time for outcomes.  We talked about speech therapy consult.    Advanced directives, concepts specific to code status, artifical feeding and hydration, and rehospitalization were considered and discussed.   Kendra Lee states that they would not accept feeding tube.  And although they have briefly discussed CODE STATUS they have not made any decisions.  Kendra Lee that he will talk about CODE STATUS "treat the treatable allow a natural passing" with his brothers tonight and make a decision.  Discussed the importance of continued conversation with family and the medical providers regarding overall plan of care and treatment options, ensuring decisions are within the context of the patient's values and GOCs.   Questions and concerns were addressed.  The family was encouraged to call with questions or concerns.  PMT will continue to support holistically.  Conference with attending, bedside nursing staff, transition of care team related to patient condition, needs, goals of care. PMT to continue to follow.  HCPOA   NEXT OF KIN -Kendra Lee is legally separated, but he would not be Management consultant.  Sons Kendra Lee would be health care surrogates and make choices as a team.     SUMMARY OF RECOMMENDATIONS   At this point continue to treat the treatable, time for outcomes. Considering CODE STATUS.  Code Status/Advance Care Planning: Full code -son Kendra Lee is talking with his brothers tonight, they will discuss CODE STATUS, but "would not let her suffer".   Symptom Management:  Per hospitalist, no additional needs at this time.  Palliative Prophylaxis:  Frequent Pain Assessment, Oral Care, and Turn Reposition  Additional Recommendations (Limitations, Scope, Preferences): Full Scope Treatment  Psycho-social/Spiritual:  Desire for further Chaplaincy support:no Additional Recommendations: Caregiving  Support/Resources  Prognosis:  Unable to determine, based  on outcomes.  Guarded at this point  Discharge Planning:  To be determined, based on outcomes.       Primary Diagnoses: Present on Admission:  Acute metabolic encephalopathy  CVA (cerebral vascular accident) (HCC)   I have reviewed the medical record, interviewed the patient and family, and examined the patient. The following aspects are pertinent.  History reviewed. No pertinent past medical history. Social History   Socioeconomic History   Marital status: Legally Separated    Spouse name: Not on file   Number of children: Not on file   Years of education: Not on file   Highest education level: Not on file  Occupational History   Not on file  Tobacco Use   Smoking status: Not on file   Smokeless tobacco: Not on file  Substance and Sexual Activity   Alcohol use: Not on file   Drug use: Not on file   Sexual activity: Not on file  Other Topics Concern   Not on file  Social History Narrative   Not on file   Social Determinants of Health   Financial Resource Strain: Not on file  Food Insecurity: Not on file  Transportation Needs: Not on file  Physical Activity: Not on file  Stress: Not on file  Social Connections: Not on file   History reviewed. No pertinent family history. Scheduled Meds:  amLODipine  5 mg Oral Daily   aspirin  325 mg Oral Daily   Or   aspirin  300 mg Rectal Daily   atorvastatin  80 mg Oral Daily   clopidogrel  75 mg Oral Q breakfast   heparin  5,000 Units Subcutaneous Q8H   thiamine injection  100 mg Intravenous Daily   Continuous Infusions:  dextrose 5 % and 0.45% NaCl 75 mL/hr at 09/19/20 0526   PRN Meds:.acetaminophen **OR** acetaminophen (TYLENOL) oral liquid 160 mg/5 mL **OR** acetaminophen, hydrALAZINE Medications Prior to Admission:  Prior to Admission medications   Not on File   No Known Allergies Review of Systems  Unable to perform ROS: Mental status change   Physical Exam Vitals and nursing note reviewed.   Constitutional:      General: She is not in acute distress.    Appearance: She is ill-appearing.     Comments: Looks much older than stated age  HENT:     Head: Normocephalic and atraumatic.  Cardiovascular:     Rate and Rhythm: Normal rate.  Pulmonary:     Effort: Pulmonary effort is normal. No respiratory distress.  Abdominal:     General: Abdomen is flat.  Skin:    General: Skin is warm and dry.  Neurological:     Comments: Does not respond to voice or touch    Vital Signs: BP (!) 161/59   Pulse 69  Temp 98.2 F (36.8 C) (Axillary)   Resp 15   Ht 5\' 1"  (1.549 m)   Wt 42.1 kg   SpO2 99%   BMI 17.54 kg/m  Pain Scale: PAINAD   Pain Score: Asleep   SpO2: SpO2: 99 % O2 Device:SpO2: 99 % O2 Flow Rate: .O2 Flow Rate (L/min): 2 L/min  IO: Intake/output summary:  Intake/Output Summary (Last 24 hours) at 09/19/2020 1614 Last data filed at 09/19/2020 0320 Gross per 24 hour  Intake 800.21 ml  Output --  Net 800.21 ml    LBM: Last BM Date: 09/19/20 Baseline Weight: Weight: 42.1 kg Most recent weight: Weight: 42.1 kg     Palliative Assessment/Data:   Flowsheet Rows    Flowsheet Row Most Recent Value  Intake Tab   Referral Department Hospitalist  Unit at Time of Referral Cardiac/Telemetry Unit  Palliative Care Primary Diagnosis Neurology  Date Notified 09/19/20  Palliative Care Type New Palliative care  Reason for referral Clarify Goals of Care  Date of Admission 09/17/20  Date first seen by Palliative Care 09/19/20  # of days Palliative referral response time 0 Day(s)  # of days IP prior to Palliative referral 2  Clinical Assessment   Palliative Performance Scale Score 10%  Pain Max last 24 hours Not able to report  Pain Min Last 24 hours Not able to report  Dyspnea Max Last 24 Hours Not able to report  Dyspnea Min Last 24 hours Not able to report  Psychosocial & Spiritual Assessment   Palliative Care Outcomes        Time In: 1500 Time Out:  1610 Time Total: 70 minutes  Greater than 50%  of this time was spent counseling and coordinating care related to the above assessment and plan.  Signed by: 09/21/20, NP   Please contact Palliative Medicine Team phone at [phone] for questions and concerns.  For individual provider: See [phone]

## 2020-09-19 NOTE — Progress Notes (Signed)
Patient suctioned for oral secretions. Tolerated well. Neuro check same as previous. Vitals stable. BP within range.

## 2020-09-19 NOTE — Plan of Care (Signed)
  Problem: Acute Rehab PT Goals(only PT should resolve) Goal: Pt Will Go Supine/Side To Sit Outcome: Progressing Flowsheets (Taken 09/19/2020 1227) Pt will go Supine/Side to Sit: with minimal assist Goal: Pt Will Go Sit To Supine/Side Outcome: Progressing Flowsheets (Taken 09/19/2020 1227) Pt will go Sit to Supine/Side: with minimal assist Goal: Patient Will Transfer Sit To/From Stand Outcome: Progressing Flowsheets (Taken 09/19/2020 1227) Patient will transfer sit to/from stand:  with minimal assist  with moderate assist Goal: Pt Will Transfer Bed To Chair/Chair To Bed Outcome: Progressing Flowsheets (Taken 09/19/2020 1227) Pt will Transfer Bed to Chair/Chair to Bed:  with min assist  with mod assist

## 2020-09-19 NOTE — Progress Notes (Addendum)
PROGRESS NOTE    Patient: Kendra Lee                            PCP: Pcp, No                    DOB: 06/17/1955            DOA: 09/17/2020 UJW:[id number]             DOS: 09/19/2020, 11:06 AM   LOS: 1 day   Date of Service: The patient was seen and examined on 09/19/2020  Subjective:   Was seen and examined this morning, no significant changes still very lethargic, encephalopathic not following any commands, nonverbal spontaneously moving lower extremities, withdraws with pain does not move her right arm at all facial asymmetry... Remained hypertensive  Brief Narrative:   Chandrika Sandles  is a 65 y.o. female, with no known medical history, who presents to the ED with altered mental status. History is quite limited 2/2 patient's non verbal status. Earlier, family was able to provide some history to the ED provider. They reported that the patient's last known normal was 6/24. She has been somnolent, and not taking care of herself in that interval. Family was not aware of an EtOH or illicit drug use. They are not aware of an trauma, and there are not signs of trauma on exam. Family reported no infectious symptoms or fevers.  ED: Vitals stable, encephalopathic, CT head shows focal regions of hypoattenuation seen in the genu of the right internal capsule, posterior limb of the left internal capsule and right thalamus which could reflect age-indeterminate infarction.  No other acute abnormality  Assessment & Plan:   Active Problems:   Acute metabolic encephalopathy   CVA (cerebral vascular accident) (HCC)   Leukocytosis   Essential hypertension   Acute metabolic encephalopathy Remains encephalopathic, nonverbal at this time > If no significant changes, remaining encephalopathic greater than 48 hours... Anticipating poor prognosis,-- palliative consulted -Remain n.p.o. for now continue IV fluids -Encephalopathy related to current stroke -CT and MRI of the head obtained reviewed,  revealing multiple thalamic stroke -CBC CMP within normal limits, TSH normal,, normal CO2 on VBG, -Urine drug screen negative, UA negative -Chest x-ray consistent with COPD no acute infiltrate -Troponins normal -Continue neurochecks -We will continue investigating any other source of encephalopathy other than stroke  Acute embolic stroke  -Remained encephalopathic -CT of the head revealing right internal capsule, posterior limb of left internal capsule and right thalamic lesions -MRI of the brain: Scattered small vessel infarcts in the bilateral thalamus,bilateral internal capsule, and bilateral hemispheric white matter.These are not in a unified arterial or venous distribution and are of uncertain cause. No revealing background chronic brain findings. 2. Significantly motion degraded  -2D echocardiogram:   -US carotid bilateral :IMPRESSION: 1. 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 2. Less than 50% stenosis of the left internal carotid artery.  -PT/OT/speech consultation pending evaluation -High-dose statin, aspirin initiated -Neurology consulted -appreciate input Anticipating full dose statins, Third platelet therapy once patient can tolerate p.o.  -Continue monitoring on telemetry floor  Leukocytosis -Likely reactive, UA negative, chest x-ray unimpressive -Respiratory panel negative -Afebrile normotensive no signs of sepsis or overt infection   Hypertension -For now allowing permissive hypertension -Anticipate restarting home medication of amlodipine -Next 24-48 hours will initiate BP meds, along with hydralazine slowly bringing blood pressure down -    --------------------------------------------------------------------------------------------------------------------------------- Nutritional  status:  The patient's BMI is: Body mass index is 17.54 kg/m. I agree with the assessment and plan as outlined  --------------------------------------------------------------------------------------------------------------------------------- Cultures; None  Antimicrobials: none   Consultants:  Neurologist PT/OT/speech  ---------------------------------------------------------------------------------------------------------------------------------  DVT prophylaxis:  SCD/Compression stockings Code Status:   Code Status: Full Code  Family Communication: Discussed with the patient's son at bedside The above findings and plan of care has been discussed with patient (and family)  in detail,  they expressed understanding and agreement of above. -Advance care planning has been discussed.  Prognosis remain grim due to extensive stroke.   Admission status:   Status is: Inpatient  Remains inpatient appropriate because:Inpatient level of care appropriate due to severity of illness  Dispo: The patient is from: Home              Anticipated d/c is to: To be determined              Patient currently is not medically stable to d/c.  Continue for evaluation stroke, consultant input, determining disposition   Difficult to place patient No      Level of care: Telemetry   Procedures:   No admission procedures for hospital encounter.  MRI of the brain CT of the head 2D echocardiogram  Antimicrobials:  Anti-infectives (From admission, onward)    None        Medication:   amLODipine  5 mg Oral Daily   aspirin  325 mg Oral Daily   Or   aspirin  300 mg Rectal Daily   atorvastatin  80 mg Oral Daily   clopidogrel  75 mg Oral Q breakfast   heparin  5,000 Units Subcutaneous Q8H   thiamine injection  100 mg Intravenous Daily    acetaminophen **OR** acetaminophen (TYLENOL) oral liquid 160 mg/5 mL **OR** acetaminophen, hydrALAZINE   Objective:   Vitals:   09/19/20 0100 09/19/20 0500 09/19/20 0900 09/19/20 0956  BP: (!) 216/102 (!) 175/96 (!) 201/84 (!) 180/77  Pulse: 75 69    Resp:  18 15    Temp: 98 F (36.7 C) 98.2 F (36.8 C)    TempSrc: Axillary Axillary    SpO2: 97% 99%    Weight:      Height:        Intake/Output Summary (Last 24 hours) at 09/19/2020 1106 Last data filed at 09/19/2020 0320 Gross per 24 hour  Intake 800.21 ml  Output --  Net 800.21 ml   Filed Weights   09/18/20 1600  Weight: 42.1 kg     Examination:   Physical Exam  Constitution: Remained encephalopathic, lethargic, with a complete neglect of the right. Nonverbal... Withdraws only to pain stimuli, asymmetric facial features, drooling  HEENT: Normocephalic, PERRL, otherwise with in Normal limits  Chest:Chest symmetric Cardio vascular:  S1/S2, RRR, No murmure, No Rubs or Gallops  pulmonary: Clear to auscultation bilaterally, respirations unlabored, negative wheezes / crackles Abdomen: Soft, non-tender, non-distended, bowel sounds,no masses, no organomegaly Muscular skeletal: Limited exam -partially squeezes with the left hand, moving spontaneously in bed, bilateral leg draw to pain stimuli Neuro: No significant changes, facial asymmetry, nonverbal, lethargic, somnolent dense right arm weakness with neglect withdraws both legs to pain stimuli Able to move arm and withdraw to pain  extremities: Right-sided weakness, no pitting edema lower extremities, +2 pulses  Skin: Dry, warm to touch, negative for any Rashes, No open wounds Wounds: per nursing documentation    ------------------------------------------------------------------------------------------------------------------------------------------    LABs:  CBC Latest Ref Rng & Units 09/18/2020 09/17/2020  WBC 4.0 - 10.5 K/uL 9.8 11.6(H)  Hemoglobin 12.0 - 15.0 g/dL 13.2 15.1(H)  Hematocrit 36.0 - 46.0 % 41.0 45.0  Platelets 150 - 400 K/uL 301 308   CMP Latest Ref Rng & Units 09/18/2020 09/17/2020  Glucose 70 - 99 mg/dL 440(N) 027(O)  BUN 8 - 23 mg/dL 15 13  Creatinine 5.36 - 1.00 mg/dL 6.44 0.34  Sodium 742 - 145 mmol/L 140  140  Potassium 3.5 - 5.1 mmol/L 3.6 3.5  Chloride 98 - 111 mmol/L 106 103  CO2 22 - 32 mmol/L 25 25  Calcium 8.9 - 10.3 mg/dL 9.2 9.5  Total Protein 6.5 - 8.1 g/dL 6.6 7.5  Total Bilirubin 0.3 - 1.2 mg/dL 0.6 0.7  Alkaline Phos 38 - 126 U/L 66 77  AST 15 - 41 U/L 16 19  ALT 0 - 44 U/L 12 13       Micro Results Recent Results (from the past 240 hour(s))  Culture, blood (routine x 2)     Status: None (Preliminary result)   Collection Time: 09/17/20 10:00 PM   Specimen: Right Antecubital; Blood  Result Value Ref Range Status   Specimen Description RIGHT ANTECUBITAL  Final   Special Requests   Final    BOTTLES DRAWN AEROBIC AND ANAEROBIC Blood Culture results may not be optimal due to an excessive volume of blood received in culture bottles   Culture   Final    NO GROWTH 2 DAYS Performed at [facility], [address] [place], [place] [address]    Report Status PENDING  Incomplete  Resp Panel by RT-PCR (Flu A&B, Covid) Nasopharyngeal Swab     Status: None   Collection Time: 09/17/20 10:35 PM   Specimen: Nasopharyngeal Swab; Nasopharyngeal(NP) swabs in vial transport medium  Result Value Ref Range Status   SARS Coronavirus 2 by RT PCR NEGATIVE NEGATIVE Final    Comment: (NOTE) SARS-CoV-2 target nucleic acids are NOT DETECTED.  The SARS-CoV-2 RNA is generally detectable in upper respiratory specimens during the acute phase of infection. The lowest concentration of SARS-CoV-2 viral copies this assay can detect is 138 copies/mL. A negative result does not preclude SARS-Cov-2 infection and should not be used as the sole basis for treatment or other patient management decisions. A negative result may occur with  improper specimen collection/handling, submission of specimen other than nasopharyngeal swab, presence of viral mutation(s) within the areas targeted by this assay, and inadequate number of viral copies(<138 copies/mL). A negative result must be combined with clinical  observations, patient history, and epidemiological information. The expected result is Negative.  Fact Sheet for Patients:  BloggerCourse.com  Fact Sheet for Healthcare Providers:  SeriousBroker.it  This test is no t yet approved or cleared by the [place] FDA and  has been authorized for detection and/or diagnosis of SARS-CoV-2 by FDA under an Emergency Use Authorization (EUA). This EUA will remain  in effect (meaning this test can be used) for the duration of the COVID-19 declaration under Section 564(b)(1) of the Act, 21 U.S.C.section 360bbb-3(b)(1), unless the authorization is terminated  or revoked sooner.       Influenza A by PCR NEGATIVE NEGATIVE Final   Influenza B by PCR NEGATIVE NEGATIVE Final    Comment: (NOTE) The Xpert Xpress SARS-CoV-2/FLU/RSV plus assay is intended as an aid in the diagnosis of influenza from Nasopharyngeal swab specimens and should not be used as a sole basis for treatment. Nasal washings and aspirates are unacceptable for Xpert Xpress SARS-CoV-2/FLU/RSV testing.  Fact Sheet for Patients: BloggerCourse.com  Fact Sheet for Healthcare Providers: SeriousBroker.it  This test is not yet approved or cleared by the [place] FDA and has been authorized for detection and/or diagnosis of SARS-CoV-2 by FDA under an Emergency Use Authorization (EUA). This EUA will remain in effect (meaning this test can be used) for the duration of the COVID-19 declaration under Section 564(b)(1) of the Act, 21 U.S.C. section 360bbb-3(b)(1), unless the authorization is terminated or revoked.  Performed at [facility], [address] [place], [place] [address]   Culture, blood (routine x 2)     Status: None (Preliminary result)   Collection Time: 09/18/20 12:01 AM   Specimen: BLOOD LEFT ARM  Result Value Ref Range Status   Specimen Description BLOOD LEFT ARM   Final   Special Requests   Final    BOTTLES DRAWN AEROBIC AND ANAEROBIC Blood Culture adequate volume   Culture   Final    NO GROWTH 1 DAY Performed at [facility], [address] [place], [place] [address]    Report Status PENDING  Incomplete    Radiology Reports CT Head Wo Contrast  Result Date: 09/17/2020 CLINICAL DATA:  Mental status change, found this afternoon on couch EXAM: CT HEAD WITHOUT CONTRAST TECHNIQUE: Contiguous axial images were obtained from the base of the skull through the vertex without intravenous contrast. COMPARISON:  None. FINDINGS: Brain: Foci of hypoattenuation present in the right internal capsule as as a separate focus in the right caudate and posterior limb of the left internal capsule could reflect sequela of age-indeterminate lacunar type infarcts difficult to fully assess given a background of more diffuse patchy white matter hypoattenuation typically indicative of microvascular angiopathy in a patient of this age. Additional hypoattenuation in the pons and brainstem is more nonspecific given streak artifact across skull base. Symmetric prominence of the ventricles, cisterns and sulci compatible with parenchymal volume loss. No hyperdense hemorrhage. No mass effect or midline shift. No extra-axial collection. Scattered benign dural calcifications. Vascular: Atherosclerotic calcification of the carotid siphons. No hyperdense vessel. Skull: No calvarial fracture or suspicious osseous lesion. No scalp swelling or hematoma. Sinuses/Orbits: Paranasal sinuses and mastoid air cells are predominantly clear. Other: Included orbital structures are unremarkable. 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 Could be  further characterized with MR imaging as warranted. No other acute intracranial abnormality. These results were called by telephone at the time of interpretation on 09/17/2020 at 11:45 pm to provider Benjiman Core , who verbally acknowledged these results. Electronically Signed   By: Kreg Shropshire M.D.   On: 09/17/2020 23:45   MR ANGIO HEAD WO CONTRAST  Result Date: 09/18/2020 CLINICAL DATA:  Altered mental status. EXAM: MRI HEAD WITHOUT CONTRAST MRA HEAD WITHOUT CONTRAST TECHNIQUE: Multiplanar, multi-echo pulse sequences of the brain and surrounding structures were acquired without intravenous contrast. Angiographic images of the Circle of Willis were acquired using MRA technique without intravenous contrast. COMPARISON:  No pertinent prior exam. FINDINGS: MRI HEAD FINDINGS Brain: Restricted diffusion in the bilateral thalamus, bilateral internal capsule, left centrum semiovale, and right subcortical frontal. These have the appearance of acute infarcts. Mild small vessel ischemic type change in the pons and hemispheric white matter. No evidence of hemorrhage, hydrocephalus, or collection. Vascular: Preserved flow voids. Skull and upper  cervical spine: Normal marrow signal Sinuses/Orbits: Negative Other: Significant and progressive motion artifact MRA HEAD FINDINGS Very motion degraded. Carotid, vertebral, and basilar arteries and major branches are patent with gross symmetry. IMPRESSION: Brain MRI: 1. 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 These are not in a unified arterial or venous distribution and are of uncertain cause. No revealing background chronic brain findings. 2. Significantly motion degraded Intracranial MRA: Very motion degraded and of limited utility other than documenting patency of major vessels. Electronically Signed   By: Marnee Spring M.D.   On: 09/18/2020 08:43   MR BRAIN WO CONTRAST  Result Date:  09/18/2020 CLINICAL DATA:  Altered mental status. EXAM: MRI HEAD WITHOUT CONTRAST MRA HEAD WITHOUT CONTRAST TECHNIQUE: Multiplanar, multi-echo pulse sequences of the brain and surrounding structures were acquired without intravenous contrast. Angiographic images of the Circle of Willis were acquired using MRA technique without intravenous contrast. COMPARISON:  No pertinent prior exam. FINDINGS: MRI HEAD FINDINGS Brain: Restricted diffusion in the bilateral thalamus, bilateral internal capsule, left centrum semiovale, and right subcortical frontal. These have the appearance of acute infarcts. Mild small vessel ischemic type change in the pons and hemispheric white matter. No evidence of hemorrhage, hydrocephalus, or collection. Vascular: Preserved flow voids. Skull and upper cervical spine: Normal marrow signal Sinuses/Orbits: Negative Other: Significant and progressive motion artifact MRA HEAD FINDINGS Very motion degraded. Carotid, vertebral, and basilar arteries and major branches are patent with gross symmetry. IMPRESSION: Brain MRI: 1. 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 These are not in a unified arterial or venous distribution and are of uncertain cause. No revealing background chronic brain findings. 2. Significantly motion degraded Intracranial MRA: Very motion degraded and of limited utility other than documenting patency of major vessels. Electronically Signed   By: Marnee Spring M.D.   On: 09/18/2020 08:43   US Carotid Bilateral (at [facility] and [facility] only)  Result Date: 09/18/2020 CLINICAL DATA:  Altered mental status Hypertension Stroke EXAM: BILATERAL CAROTID DUPLEX ULTRASOUND TECHNIQUE: Wallace Cullens scale imaging, color Doppler and duplex ultrasound were performed of bilateral carotid and vertebral arteries in the neck. COMPARISON:  None. FINDINGS: Criteria: Quantification of carotid stenosis is based on velocity  parameters that correlate the residual internal carotid diameter with NASCET-based stenosis levels, using the diameter of the distal internal carotid lumen as the denominator for stenosis measurement. The following velocity measurements were obtained: RIGHT ICA: 156/29 cm/sec CCA: 77/11 cm/sec SYSTOLIC ICA/CCA RATIO:  2.0 ECA: 173 cm/sec LEFT ICA: 83/18 cm/sec CCA: 85/14 cm/sec SYSTOLIC ICA/CCA RATIO:  1.0 ECA: 92 cm/sec RIGHT CAROTID ARTERY: Heterogeneous calcified and noncalcified plaque of the distal common and proximal internal carotid arteries with velocity indicative of 50-69% stenosis. RIGHT VERTEBRAL ARTERY:  Antegrade flow. LEFT CAROTID ARTERY: Heterogeneous calcified and noncalcified plaque of the distal common and proximal internal carotid arteries with velocity parameters indicative of less than 50% stenosis. LEFT VERTEBRAL ARTERY:  Antegrade flow. IMPRESSION: 1. 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 2. Less than 50% stenosis of the left internal carotid artery. 3. Long segment shadowing plaque present in both common and internal carotid arteries, which can obscure higher velocities. Further evaluation with MRI or CT of the neck should be performed for more precise evaluation of degree of stenosis. Electronically Signed   By: Acquanetta Belling M.D.   On: 09/18/2020 10:01   DG Chest Portable 1 View  Result Date:  09/17/2020 CLINICAL DATA:  Altered mental status EXAM: PORTABLE CHEST 1 VIEW COMPARISON:  None FINDINGS: There is hyperinflation of the lungs compatible with COPD. Heart and mediastinal contours are within normal limits. No focal opacities or effusions. No acute bony abnormality. IMPRESSION: COPD.  No active disease. Electronically Signed   By: Charlett NoseKevin  Dover M.D.   On: 09/17/2020 23:02   ECHOCARDIOGRAM COMPLETE  Result Date: 09/18/2020    ECHOCARDIOGRAM REPORT   [facility] Date of Exam: 09/18/2020 Medical Rec #:  [id number]       Height: Accession #:     [id number]      Weight: Date of Birth:  1955-12-05       BSA: Patient Age:    64 years        BP:           213/100 mmHg Patient Gender: F               HR:           59 bpm. [facility] Procedure: 2D Echo, Cardiac Doppler and Color Doppler Indications:    Stroke  History:        Patient has no prior history of Echocardiogram examinations.                 Stroke; Risk Factors:Hypertension. Height and Weight not                 available.  Sonographer:    Mikki Harbororothy Buchanan Referring Phys: [id number] ASIA B ZIERLE-GHOSH  Sonographer Comments: Height and Weight not available IMPRESSIONS  1. Left ventricular ejection fraction, by estimation, is 60 to 65%. The left ventricle has normal function. The left ventricle has no regional wall motion abnormalities. There is mild left ventricular hypertrophy. Left ventricular diastolic parameters were normal.  2. Right ventricular systolic function is normal. The right ventricular size is normal. Tricuspid regurgitation signal is inadequate for assessing PA pressure.  3. The mitral valve is grossly normal. Trivial mitral valve regurgitation.  4. The aortic valve is tricuspid. Aortic valve regurgitation is not visualized. Mild aortic valve sclerosis is present, with no evidence of aortic valve stenosis. Aortic valve mean gradient measures 3.0 mmHg.  5. The inferior vena cava is normal in size with greater than 50% respiratory variability, suggesting right atrial pressure of 3 mmHg. FINDINGS  Left Ventricle: Left ventricular ejection fraction, by estimation, is 60 to 65%. The left ventricle has normal function. The left ventricle has no regional wall motion abnormalities. The left ventricular internal cavity size was normal in size. There is  mild left ventricular hypertrophy. Left ventricular diastolic parameters were normal. Right Ventricle: The right ventricular size is normal. No increase in right ventricular wall thickness. Right ventricular systolic function is  normal. Tricuspid regurgitation signal is inadequate for assessing PA pressure. Left Atrium: Left atrial size was normal in size. Right Atrium: Right atrial size was normal in size. Pericardium: There is no evidence of pericardial effusion. Presence of pericardial fat pad. Mitral Valve: The mitral valve is grossly normal. Mild mitral annular calcification. Trivial mitral valve regurgitation. MV peak gradient, 2.7 mmHg. The mean mitral valve gradient is 1.0 mmHg. Tricuspid Valve: The tricuspid valve is grossly normal. Tricuspid valve regurgitation is trivial. Aortic Valve: The aortic valve is tricuspid. There is mild aortic valve annular calcification. Aortic valve regurgitation is not visualized. Mild aortic valve sclerosis is present, with no evidence of aortic valve stenosis. Aortic valve  mean gradient measures 3.0 mmHg. Aortic valve peak gradient measures 5.7 mmHg. Aortic valve area, by VTI measures 2.71 cm. Pulmonic Valve: The pulmonic valve was grossly normal. Pulmonic valve regurgitation is trivial. Aorta: The aortic root is normal in size and structure. Venous: The inferior vena cava is normal in size with greater than 50% respiratory variability, suggesting right atrial pressure of 3 mmHg. IAS/Shunts: The interatrial septum appears to be lipomatous. No atrial level shunt detected by color flow Doppler.  LEFT VENTRICLE PLAX 2D LVIDd:         4.38 cm  Diastology LVIDs:         2.91 cm  LV e' medial:    7.10 cm/s LV PW:         1.04 cm  LV E/e' medial:  11.9 LV IVS:        1.06 cm  LV e' lateral:   7.62 cm/s LVOT diam:     2.00 cm  LV E/e' lateral: 11.0 LV SV:         90 LVOT Area:     3.14 cm  RIGHT VENTRICLE RV Basal diam:  3.37 cm RV Mid diam:    3.06 cm RV S prime:     10.20 cm/s TAPSE (M-mode): 2.3 cm LEFT ATRIUM             RIGHT ATRIUM LA diam:        3.00 cm RA Area:     13.30 cm LA Vol (A2C):   49.5 ml RA Volume:   31.30 ml LA Vol (A4C):   28.7 ml LA Biplane Vol: 40.5 ml  AORTIC VALVE AV Area  (Vmax):    2.85 cm AV Area (Vmean):   2.55 cm AV Area (VTI):     2.71 cm AV Vmax:           119.00 cm/s AV Vmean:          86.100 cm/s AV VTI:            0.330 m AV Peak Grad:      5.7 mmHg AV Mean Grad:      3.0 mmHg LVOT Vmax:         108.00 cm/s LVOT Vmean:        69.800 cm/s LVOT VTI:          0.285 m LVOT/AV VTI ratio: 0.86  AORTA Ao Root diam: 3.30 cm MITRAL VALVE MV Area (PHT): 3.15 cm    SHUNTS MV Area VTI:   2.54 cm    Systemic VTI:  0.29 m MV Peak grad:  2.7 mmHg    Systemic Diam: 2.00 cm MV Mean grad:  1.0 mmHg MV Vmax:       0.81 m/s MV Vmean:      41.2 cm/s MV Decel Time: 241 msec MV E velocity: 84.20 cm/s MV A velocity: 72.70 cm/s MV E/A ratio:  1.16 Nona Dell MD Electronically signed by Nona Dell MD Signature Date/Time: 09/18/2020/2:03:27 PM    Final     SIGNED: Kendell Bane, MD, FHM. [facility],  Pager (please use [email or web address] to page/text) Please use Epic Secure Chat for non-urgent communication (7AM-7PM)  If 7PM-7AM, please contact night-coverage [email or web address], 09/19/2020, 11:06 AM

## 2020-09-19 NOTE — Progress Notes (Signed)
[facility] Ledarrius Beauchaine A. Gerilyn Pilgrim, MD     [email or web address]          Kendra Lee is an 65 y.o. female.   ASSESSMENT/PLAN: 1.  Acute mentation changes/encephalopathy due to multiple bilateral ischemic strokes.  Encephalopathy is most likely due to bilateral thalamic infarcts in particular.  Etiology is most likely embolic and most likely cardioembolic.  The patient should have a loop recorder and TEE.  Dual antiplatelet agents are recommended in the meantime.  Encephalopathy is expected to be improved with time.  She also has severe dysphagia and dysarthria which is also improved. 2.  Malnourished state: Thiamine will be started. 3.  Poor dentition which could be possible source for septic emboli if turns out to be appropriate.  The patient appears to be more awake and more responsive.   GENERAL: This is a thin malnourished appearing female who is apparently uncomfortable but no acute distress.  HEENT: Dentition is poor.  Neck is supple no trauma noted.  Excessive secretions noted.  ABDOMEN: soft  EXTREMITIES: No edema   BACK: Normal  SKIN: Normal by inspection.    MENTAL STATUS: She lays in bed with eyes closed but opens her eyes to light sternal rub.  She does follow commands briskly.  She has excessive secretions and has severe speech impairment with only a few words stated.  CRANIAL NERVES: Pupils are equal, round and reactive to light and accomodation; extra ocular movements are full, there is no significant nystagmus; visual fields are full; upper and lower facial muscles are normal in strength and symmetric, there is no flattening of the nasolabial folds  MOTOR: The right upper extremity is profoundly weak graded as 2/5. [3] The other extremities are estimated to be about 4/5 with no drift in all.  COORDINATION: No tremors, dysmetria or parkinsonism noted.  No myoclonus.    Blood pressure (!) 184/83, pulse 61, temperature 98.2 F (36.8 C), temperature  source Axillary, resp. rate 15, height  (1.549 m), weight 42.1 kg, SpO2 99 %.  History reviewed. No pertinent past medical history.   History reviewed. No pertinent family history.  Social History:  has no history on file for tobacco use, alcohol use, and drug use.  Allergies: No Known Allergies  Medications: Prior to Admission medications   Not on File    Scheduled Meds:  amLODipine  5 mg Oral Daily   aspirin  325 mg Oral Daily   Or   aspirin  300 mg Rectal Daily   atorvastatin  80 mg Oral Daily   clopidogrel  75 mg Oral Q breakfast   heparin  5,000 Units Subcutaneous Q8H   mouth rinse  15 mL Mouth Rinse BID   thiamine injection  100 mg Intravenous Daily   Continuous Infusions:  dextrose 5 % and 0.45% NaCl 75 mL/hr at 09/19/20 0526   PRN Meds:.acetaminophen **OR** acetaminophen (TYLENOL) oral liquid 160 mg/5 mL **OR** acetaminophen, hydrALAZINE     Results for orders placed or performed during the hospital encounter of 09/17/20 (from the past 48 hour(s))  Comprehensive metabolic panel     Status: Abnormal   Collection Time: 09/17/20 10:00 PM  Result Value Ref Range   Sodium 140 135 - 145 mmol/L   Potassium 3.5 3.5 - 5.1 mmol/L   Chloride 103 98 - 111 mmol/L   CO2 25 22 - 32 mmol/L   Glucose, Bld 120 (H) 70 - 99 mg/dL    Comment: Glucose reference range applies only to samples  taken after fasting for at least 8 hours.   BUN 13 8 - 23 mg/dL   Creatinine, Ser 1.610.77 0.44 - 1.00 mg/dL   Calcium 9.5 8.9 - 09.610.3 mg/dL   Total Protein 7.5 6.5 - 8.1 g/dL   Albumin 4.2 3.5 - 5.0 g/dL   AST 19 15 - 41 U/L   ALT 13 0 - 44 U/L   Alkaline Phosphatase 77 38 - 126 U/L   Total Bilirubin 0.7 0.3 - 1.2 mg/dL   GFR, Estimated >04>60 >54>60 mL/min    Comment: (NOTE) Calculated using the CKD-EPI Creatinine Equation (2021)    Anion gap 12 5 - 15    Comment: Performed at [facility], [address] [place], [place] [address]  Ethanol     Status: None   Collection Time: 09/17/20  10:00 PM  Result Value Ref Range   Alcohol, Ethyl (B) <10 <10 mg/dL    Comment: (NOTE) Lowest detectable limit for serum alcohol is 10 mg/dL.  For medical purposes only. Performed at [facility], [address] [place], [place] [address]   CBC with Differential     Status: Abnormal   Collection Time: 09/17/20 10:00 PM  Result Value Ref Range   WBC 11.6 (H) 4.0 - 10.5 K/uL   RBC 4.80 3.87 - 5.11 MIL/uL   Hemoglobin 15.1 (H) 12.0 - 15.0 g/dL   HCT 29.545.0 62.136.0 - 30.846.0 %   MCV 93.8 80.0 - 100.0 fL   MCH 31.5 26.0 - 34.0 pg   MCHC 33.6 30.0 - 36.0 g/dL   RDW 65.713.4 84.611.5 - 96.215.5 %   Platelets 308 150 - 400 K/uL   nRBC 0.0 0.0 - 0.2 %   Neutrophils Relative % 77 %   Neutro Abs 8.9 (H) 1.7 - 7.7 K/uL   Lymphocytes Relative 16 %   Lymphs Abs 1.8 0.7 - 4.0 K/uL   Monocytes Relative 7 %   Monocytes Absolute 0.8 0.1 - 1.0 K/uL   Eosinophils Relative 0 %   Eosinophils Absolute 0.0 0.0 - 0.5 K/uL   Basophils Relative 0 %   Basophils Absolute 0.1 0.0 - 0.1 K/uL   Immature Granulocytes 0 %   Abs Immature Granulocytes 0.04 0.00 - 0.07 K/uL    Comment: Performed at [facility], [address] [place], [place] [address]  Troponin I (High Sensitivity)     Status: None   Collection Time: 09/17/20 10:00 PM  Result Value Ref Range   Troponin I (High Sensitivity) 5 <18 ng/L    Comment: (NOTE) Elevated high sensitivity troponin I (hsTnI) values and significant  changes across serial measurements may suggest ACS but many other  chronic and acute conditions are known to elevate hsTnI results.  Refer to the "Links" section for chest pain algorithms and additional  guidance. Performed at [facility], [address] [place], [place] [address]   Culture, blood (routine x 2)     Status: None (Preliminary result)   Collection Time: 09/17/20 10:00 PM   Specimen: Right Antecubital; Blood  Result Value Ref Range   Specimen Description RIGHT ANTECUBITAL    Special Requests      BOTTLES DRAWN AEROBIC AND  ANAEROBIC Blood Culture results may not be optimal due to an excessive volume of blood received in culture bottles   Culture      NO GROWTH 2 DAYS Performed at [facility], [address] [place], [place] [address]    Report Status PENDING   Resp Panel by RT-PCR (  Flu A&B, Covid) Nasopharyngeal Swab     Status: None   Collection Time: 09/17/20 10:35 PM   Specimen: Nasopharyngeal Swab; Nasopharyngeal(NP) swabs in vial transport medium  Result Value Ref Range   SARS Coronavirus 2 by RT PCR NEGATIVE NEGATIVE    Comment: (NOTE) SARS-CoV-2 target nucleic acids are NOT DETECTED.  The SARS-CoV-2 RNA is generally detectable in upper respiratory specimens during the acute phase of infection. The lowest concentration of SARS-CoV-2 viral copies this assay can detect is 138 copies/mL. A negative result does not preclude SARS-Cov-2 infection and should not be used as the sole basis for treatment or other patient management decisions. A negative result may occur with  improper specimen collection/handling, submission of specimen other than nasopharyngeal swab, presence of viral mutation(s) within the areas targeted by this assay, and inadequate number of viral copies(<138 copies/mL). A negative result must be combined with clinical observations, patient history, and epidemiological information. The expected result is Negative.  Fact Sheet for Patients:  BloggerCourse.com  Fact Sheet for Healthcare Providers:  SeriousBroker.it  This test is no t yet approved or cleared by the [place] FDA and  has been authorized for detection and/or diagnosis of SARS-CoV-2 by FDA under an Emergency Use Authorization (EUA). This EUA will remain  in effect (meaning this test can be used) for the duration of the COVID-19 declaration under Section 564(b)(1) of the Act, 21 U.S.C.section 360bbb-3(b)(1), unless the authorization is terminated  or revoked sooner.        Influenza A by PCR NEGATIVE NEGATIVE   Influenza B by PCR NEGATIVE NEGATIVE    Comment: (NOTE) The Xpert Xpress SARS-CoV-2/FLU/RSV plus assay is intended as an aid in the diagnosis of influenza from Nasopharyngeal swab specimens and should not be used as a sole basis for treatment. Nasal washings and aspirates are unacceptable for Xpert Xpress SARS-CoV-2/FLU/RSV testing.  Fact Sheet for Patients: BloggerCourse.com  Fact Sheet for Healthcare Providers: SeriousBroker.it  This test is not yet approved or cleared by the [place] FDA and has been authorized for detection and/or diagnosis of SARS-CoV-2 by FDA under an Emergency Use Authorization (EUA). This EUA will remain in effect (meaning this test can be used) for the duration of the COVID-19 declaration under Section 564(b)(1) of the Act, 21 U.S.C. section 360bbb-3(b)(1), unless the authorization is terminated or revoked.  Performed at [facility], [address], [place], [place] [address]   Blood gas, arterial (at [facility])     Status: Abnormal   Collection Time: 09/17/20 11:15 PM  Result Value Ref Range   FIO2 28.00    pH, Arterial 7.410 7.350 - 7.450   pCO2 arterial 37.7 32.0 - 48.0 mmHg   pO2, Arterial 74.4 (L) 83.0 - 108.0 mmHg   Bicarbonate 24.0 20.0 - 28.0 mmol/L   Acid-base deficit 0.6 0.0 - 2.0 mmol/L   O2 Saturation 93.5 %   Patient temperature 37.0    Allens test (pass/fail) PASS PASS    Comment: Performed at [facility], [address], [place], [place] [address]  Lactic acid, plasma     Status: None   Collection Time: 09/18/20 12:01 AM  Result Value Ref Range   Lactic Acid, Venous 1.4 0.5 - 1.9 mmol/L    Comment: Performed at [facility], [address] [place], [place] [address]  Culture, blood (routine x 2)     Status: None (Preliminary result)   Collection Time: 09/18/20 12:01 AM   Specimen: BLOOD LEFT ARM  Result Value  Ref Range   Specimen  Description BLOOD LEFT ARM    Special Requests      BOTTLES DRAWN AEROBIC AND ANAEROBIC Blood Culture adequate volume   Culture      NO GROWTH 1 DAY Performed at [facility], [address], [place], [place] [address]    Report Status PENDING   Ammonia     Status: None   Collection Time: 09/18/20 12:02 AM  Result Value Ref Range   Ammonia 15 9 - 35 umol/L    Comment: Performed at [facility], [address] [place], [place] [address]  TSH     Status: None   Collection Time: 09/18/20 12:02 AM  Result Value Ref Range   TSH 1.267 0.350 - 4.500 uIU/mL    Comment: Performed by a 3rd Generation assay with a functional sensitivity of <=0.01 uIU/mL. Performed at [facility], [address] [place], [place] [address]   Vitamin B12     Status: None   Collection Time: 09/18/20 12:02 AM  Result Value Ref Range   Vitamin B-12 262 180 - 914 pg/mL    Comment: (NOTE) This assay is not validated for testing neonatal or myeloproliferative syndrome specimens for Vitamin B12 levels. Performed at [facility], [address], [place], [place] [address]   Troponin I (High Sensitivity)     Status: None   Collection Time: 09/18/20 12:08 AM  Result Value Ref Range   Troponin I (High Sensitivity) 6 <18 ng/L    Comment: (NOTE) Elevated high sensitivity troponin I (hsTnI) values and significant  changes across serial measurements may suggest ACS but many other  chronic and acute conditions are known to elevate hsTnI results.  Refer to the "Links" section for chest pain algorithms and additional  guidance. Performed at [facility], [address] [place], [place] [address]   Urine rapid drug screen (hosp performed)     Status: None   Collection Time: 09/18/20 12:20 AM  Result Value Ref Range   Opiates NONE DETECTED NONE DETECTED   Cocaine NONE DETECTED NONE DETECTED   Benzodiazepines NONE DETECTED NONE DETECTED   Amphetamines NONE DETECTED NONE DETECTED   Tetrahydrocannabinol NONE DETECTED NONE  DETECTED   Barbiturates NONE DETECTED NONE DETECTED    Comment: (NOTE) DRUG SCREEN FOR MEDICAL PURPOSES ONLY.  IF CONFIRMATION IS NEEDED FOR ANY PURPOSE, NOTIFY LAB WITHIN 5 DAYS.  LOWEST DETECTABLE LIMITS FOR URINE DRUG SCREEN Drug Class                     Cutoff (ng/mL) Amphetamine and metabolites    1000 Barbiturate and metabolites    200 Benzodiazepine                 200 Tricyclics and metabolites     300 Opiates and metabolites        300 Cocaine and metabolites        300 THC                            50 Performed at [facility], [address] [place], [place] [address]   Urinalysis, Routine w reflex microscopic Urine, Catheterized     Status: Abnormal   Collection Time: 09/18/20 12:20 AM  Result Value Ref Range   Color, Urine YELLOW YELLOW   APPearance CLEAR CLEAR   Specific Gravity, Urine 1.013 1.005 - 1.030   pH 5.0 5.0 - 8.0   Glucose, UA  NEGATIVE NEGATIVE mg/dL   Hgb urine dipstick SMALL (A) NEGATIVE   Bilirubin Urine NEGATIVE NEGATIVE   Ketones, ur 20 (A) NEGATIVE mg/dL   Protein, ur NEGATIVE NEGATIVE mg/dL   Nitrite NEGATIVE NEGATIVE   Leukocytes,Ua NEGATIVE NEGATIVE   RBC / HPF 0-5 0 - 5 RBC/hpf   WBC, UA 0-5 0 - 5 WBC/hpf   Bacteria, UA NONE SEEN NONE SEEN   Squamous Epithelial / LPF 0-5 0 - 5   Mucus PRESENT     Comment: Performed at [facility], [address] [place], [place] [address]  Lactic acid, plasma     Status: None   Collection Time: 09/18/20  3:49 AM  Result Value Ref Range   Lactic Acid, Venous 1.2 0.5 - 1.9 mmol/L    Comment: Performed at [facility], [address] [place], [place] [address]  HIV Antibody (routine testing w rflx)     Status: None   Collection Time: 09/18/20  3:50 AM  Result Value Ref Range   HIV Screen 4th Generation wRfx Non Reactive Non Reactive    Comment: Performed at [facility], [address], [place], [place] [address]  Hemoglobin A1c     Status: None   Collection Time: 09/18/20  3:50 AM  Result  Value Ref Range   Hgb A1c MFr Bld 5.5 4.8 - 5.6 %    Comment: (NOTE)         Prediabetes: 5.7 - 6.4         Diabetes: >6.4         Glycemic control for adults with diabetes: <7.0    Mean Plasma Glucose 111 mg/dL    Comment: (NOTE) Performed At: [facility]  [address] [place], [place] [id number] Jolene Schimke MD [id number]   Lipid panel     Status: Abnormal   Collection Time: 09/18/20  3:50 AM  Result Value Ref Range   Cholesterol 217 (H) 0 - 200 mg/dL   Triglycerides 81 <967 mg/dL   HDL 63 >89 mg/dL   Total CHOL/HDL Ratio 3.4 RATIO   VLDL 16 0 - 40 mg/dL   LDL Cholesterol 381 (H) 0 - 99 mg/dL    Comment:        Total Cholesterol/HDL:CHD Risk Coronary Heart Disease Risk Table                     Men   Women  1/2 Average Risk   3.4   3.3  Average Risk       5.0   4.4  2 X Average Risk   9.6   7.1  3 X Average Risk  23.4   11.0        Use the calculated Patient Ratio above and the CHD Risk Table to determine the patient's CHD Risk.        ATP III CLASSIFICATION (LDL):  <100     mg/dL   Optimal  017-510  mg/dL   Near or Above                    Optimal  130-159  mg/dL   Borderline  258-527  mg/dL   High  >782     mg/dL   Very High Performed at [facility], [address], [place], [place] [address]   CBC     Status: None   Collection Time: 09/18/20  3:51 AM  Result Value Ref Range   WBC 9.8 4.0 - 10.5  K/uL   RBC 4.36 3.87 - 5.11 MIL/uL   Hemoglobin 13.8 12.0 - 15.0 g/dL   HCT 16.1 09.6 - 04.5 %   MCV 94.0 80.0 - 100.0 fL   MCH 31.7 26.0 - 34.0 pg   MCHC 33.7 30.0 - 36.0 g/dL   RDW 40.9 81.1 - 91.4 %   Platelets 301 150 - 400 K/uL   nRBC 0.0 0.0 - 0.2 %    Comment: Performed at [facility], [address], [place], [place] [address]  Comprehensive metabolic panel     Status: Abnormal   Collection Time: 09/18/20  3:51 AM  Result Value Ref Range   Sodium 140 135 - 145 mmol/L   Potassium 3.6 3.5 - 5.1 mmol/L   Chloride 106 98 - 111 mmol/L   CO2  25 22 - 32 mmol/L   Glucose, Bld 149 (H) 70 - 99 mg/dL    Comment: Glucose reference range applies only to samples taken after fasting for at least 8 hours.   BUN 15 8 - 23 mg/dL   Creatinine, Ser 6.21 0.44 - 1.00 mg/dL   Calcium 9.2 8.9 - 30.8 mg/dL   Total Protein 6.6 6.5 - 8.1 g/dL   Albumin 3.7 3.5 - 5.0 g/dL   AST 16 15 - 41 U/L   ALT 12 0 - 44 U/L   Alkaline Phosphatase 66 38 - 126 U/L   Total Bilirubin 0.6 0.3 - 1.2 mg/dL   GFR, Estimated >65 >78 mL/min    Comment: (NOTE) Calculated using the CKD-EPI Creatinine Equation (2021)    Anion gap 9 5 - 15    Comment: Performed at [facility], [address] [place], [place] [address]  Magnesium     Status: None   Collection Time: 09/18/20  3:51 AM  Result Value Ref Range   Magnesium 2.1 1.7 - 2.4 mg/dL    Comment: Performed at [facility], [address] [place], [place] [address]  RPR     Status: None   Collection Time: 09/18/20  8:04 PM  Result Value Ref Range   RPR Ser Ql NON REACTIVE NON REACTIVE    Comment: Performed at [facility], [address], [place], [place] [address]    Studies/Results: BRAIN MRI MRA FINDINGS: MRI HEAD FINDINGS   Brain: Restricted diffusion in the bilateral thalamus, bilateral internal capsule, left centrum semiovale, and right subcortical frontal. These have the appearance of acute infarcts. Mild small vessel ischemic type change in the pons and hemispheric white matter. No evidence of hemorrhage, hydrocephalus, or collection.   Vascular: Preserved flow voids.   Skull and upper cervical spine: Normal marrow signal   Sinuses/Orbits: Negative   Other: Significant and progressive motion artifact   MRA HEAD FINDINGS   Very motion degraded. Carotid, vertebral, and basilar arteries and major branches are patent with gross symmetry.   IMPRESSION: Brain MRI:   1. Scattered small vessel infarcts in the bilateral thalamus, bilateral internal capsule, and bilateral hemispheric  white matter. These are not in a unified arterial or venous distribution and are of uncertain cause. No revealing background chronic brain findings. 2. Significantly motion degraded   Intracranial MRA:   Very motion degraded and of limited utility other than documenting patency of major vessels.     TTE  1. Left ventricular ejection fraction, by estimation, is 60 to 65%. The  left ventricle has normal function. The left ventricle has no regional  wall motion abnormalities. There is mild left ventricular  hypertrophy.  Left ventricular diastolic parameters  were normal.   2. Right ventricular systolic function is normal. The right ventricular  size is normal. Tricuspid regurgitation signal is inadequate for assessing  PA pressure.   3. The mitral valve is grossly normal. Trivial mitral valve  regurgitation.   4. The aortic valve is tricuspid. Aortic valve regurgitation is not  visualized. Mild aortic valve sclerosis is present, with no evidence of  aortic valve stenosis. Aortic valve mean gradient measures 3.0 mmHg.   5. The inferior vena cava is normal in size with greater than 50%  respiratory variability, suggesting right atrial pressure of 3 mmHg.       The brain MRI scan and MRA are both reviewed in person.  There are multiple increased signal on DWI with the mostly corresponding reduced signal on the ADC scan consistent with acute ischemic stroke.  These are seen in the thalami bilaterally, internal capsule bilaterally right centrum semiovale, right frontal lobe and a few involving the left frontal lobe.  The constellation of findings are consistent with embolism most likely cardioembolism.  There is mild confluent chronic white matter periventricular changes noted.  No hemorrhages noted.  No intracranial occlusive disease.    Klarisa Barman A. Gerilyn Pilgrim, M.D.  Diplomate, Biomedical engineer of Psychiatry and Neurology ( Neurology). 09/19/2020, 6:05 PM

## 2020-09-19 NOTE — Plan of Care (Signed)
  Problem: Acute Rehab OT Goals (only OT should resolve) Goal: Pt. Will Perform Grooming Flowsheets (Taken 09/19/2020 1330) Pt Will Perform Grooming:  with supervision  sitting  standing Goal: Pt. Will Perform Upper Body Dressing Flowsheets (Taken 09/19/2020 1330) Pt Will Perform Upper Body Dressing:  with min assist  sitting  standing Goal: Pt. Will Transfer To Toilet Flowsheets (Taken 09/19/2020 1330) Pt Will Transfer to Toilet:  with mod assist  stand pivot transfer  ambulating  regular height toilet  bedside commode Goal: Pt. Will Perform Toileting-Clothing Manipulation Flowsheets (Taken 09/19/2020 1330) Pt Will Perform Toileting - Clothing Manipulation and hygiene:  with min assist  sitting/lateral leans  sit to/from stand Goal: Pt/Caregiver Will Perform Home Exercise Program Flowsheets (Taken 09/19/2020 1330) Pt/caregiver will Perform Home Exercise Program:  Increased ROM  Increased strength  Both right and left upper extremity  With Supervision  With written HEP provided

## 2020-09-19 NOTE — Evaluation (Signed)
Physical Therapy Evaluation Patient Details Name: Kendra Lee MRN: [id number] DOB: 1955/08/14 Today's Date: 09/19/2020   History of Present Illness  Kendra Lee  is a 65 y.o. female, with no known medical history, who presents to the ED with altered mental status. History is quite limited 2/2 patient's non verbal status. Earlier, family was able to provide some history to the ED provider. They reported that the patient's last known normal was 6/24. She has been somnolent, and not taking care of herself in that interval. Family was not aware of an EtOH or illicit drug use. They are not aware of an trauma, and there are not signs of trauma on exam. Family reported no infectious symptoms or fevers.   Clinical Impression  Patient limited for functional mobility as stated below secondary to BLE weakness, fatigue and impaired sitting balance. Recommend SLP consult as patient non-verbal and appears to be having difficulty swallowing with drooling throughout session. Patient utilizes PT assist for bed mobility to pull to seated EOB. Patient initially requires HHA for balance but is able to maintain seated balance for short periods with LUE before losing balance to L/posterior. Patient with generalized weakness but is able to move bilateral LE with cueing and for bed mobility. Patient assisted back to supine with HOB elevated at end of session. Patient will benefit from continued physical therapy in hospital and recommended venue below to increase strength, balance, endurance for safe ADLs and gait.     Follow Up Recommendations SNF    Equipment Recommendations  None recommended by PT    Recommendations for Other Services Speech consult     Precautions / Restrictions Precautions Precautions: Fall Restrictions Weight Bearing Restrictions: No      Mobility  Bed Mobility Overal bed mobility: Needs Assistance Bed Mobility: Supine to Sit;Sit to Supine     Supine to sit: Min assist;Mod  assist;HOB elevated Sit to supine: Min assist   General bed mobility comments: min/mod assist to upright trunk    Transfers                    Ambulation/Gait                Stairs            Wheelchair Mobility    Modified Rankin (Stroke Patients Only)       Balance Overall balance assessment: Needs assistance Sitting-balance support: Feet unsupported;No upper extremity supported Sitting balance-Leahy Scale: Poor Sitting balance - Comments: seated EOB                                     Pertinent Vitals/Pain Pain Assessment: No/denies pain    Home Living                   Additional Comments: Unknown as patient non-verbal    Prior Function           Comments: Unknown as patient non-verbal     Hand Dominance        Extremity/Trunk Assessment   Upper Extremity Assessment Upper Extremity Assessment: Defer to OT evaluation    Lower Extremity Assessment Lower Extremity Assessment: Generalized weakness    Cervical / Trunk Assessment Cervical / Trunk Assessment: Normal  Communication   Communication: Expressive difficulties  Cognition Arousal/Alertness: Awake/alert Behavior During Therapy: Flat affect Overall Cognitive Status: No family/caregiver present to determine baseline cognitive functioning  General Comments      Exercises     Assessment/Plan    PT Assessment Patient needs continued PT services  PT Problem List Decreased strength;Decreased mobility;Decreased range of motion;Decreased activity tolerance;Decreased balance;Impaired sensation       PT Treatment Interventions DME instruction;Therapeutic exercise;Gait training;Balance training;Stair training;Neuromuscular re-education;Functional mobility training;Therapeutic activities;Patient/family education    PT Goals (Current goals can be found in the Care Plan section)  Acute Rehab PT  Goals Patient Stated Goal: none stated PT Goal Formulation: With patient Time For Goal Achievement: 10/03/20 Potential to Achieve Goals: Fair    Frequency Min 3X/week   Barriers to discharge        Co-evaluation PT/OT/SLP Co-Evaluation/Treatment: Yes Reason for Co-Treatment: Complexity of the patient's impairments (multi-system involvement);For patient/therapist safety;To address functional/ADL transfers PT goals addressed during session: Mobility/safety with mobility;Balance;Strengthening/ROM         AM-PAC PT "6 Clicks" Mobility  Outcome Measure Help needed turning from your back to your side while in a flat bed without using bedrails?: A Little Help needed moving from lying on your back to sitting on the side of a flat bed without using bedrails?: A Little Help needed moving to and from a bed to a chair (including a wheelchair)?: A Lot Help needed standing up from a chair using your arms (e.g., wheelchair or bedside chair)?: A Lot Help needed to walk in hospital room?: A Lot Help needed climbing 3-5 steps with a railing? : Total 6 Click Score: 13    End of Session Equipment Utilized During Treatment: Oxygen Activity Tolerance: Patient limited by fatigue;Patient limited by lethargy Patient left: in bed;with call bell/phone within reach;with bed alarm set Nurse Communication: Mobility status PT Visit Diagnosis: Unsteadiness on feet (R26.81);Other abnormalities of gait and mobility (R26.89);Muscle weakness (generalized) (M62.81);Other symptoms and signs involving the nervous system (R29.898)    Time: 7939-0300 PT Time Calculation (min) (ACUTE ONLY): 14 min   Charges:   PT Evaluation $PT Eval Low Complexity: 1 Low           12:22 PM, 09/19/20 Wyman Songster PT, DPT Physical Therapist at [facility]

## 2020-09-19 NOTE — Progress Notes (Signed)
Patient suctioned for secretions. When asked to turn, she can turn to the right, but cannot turn to the left. Attempted to make eye contact with me. No recognizable verbal cues. BP improved post hydralazine, documented vitals. Bed alarm in place. Bed in lowest position. Wheels locked.

## 2020-09-20 DIAGNOSIS — Z515 Encounter for palliative care: Secondary | ICD-10-CM | POA: Diagnosis not present

## 2020-09-20 DIAGNOSIS — G9341 Metabolic encephalopathy: Secondary | ICD-10-CM | POA: Diagnosis not present

## 2020-09-20 DIAGNOSIS — I639 Cerebral infarction, unspecified: Secondary | ICD-10-CM | POA: Diagnosis not present

## 2020-09-20 DIAGNOSIS — I10 Essential (primary) hypertension: Secondary | ICD-10-CM | POA: Diagnosis not present

## 2020-09-20 DIAGNOSIS — Z7189 Other specified counseling: Secondary | ICD-10-CM | POA: Diagnosis not present

## 2020-09-20 LAB — HOMOCYSTEINE: Homocysteine: 8.7 umol/L (ref 0.0–17.2)

## 2020-09-20 MED ORDER — SCOPOLAMINE 1 MG/3DAYS TD PT72
1.0000 | MEDICATED_PATCH | TRANSDERMAL | Status: DC
  Administered 2020-09-20 – 2020-10-05 (×6): 1.5 mg via TRANSDERMAL
  Filled 2020-09-20 (×7): qty 1

## 2020-09-20 MED ORDER — ASPIRIN 300 MG RE SUPP
300.0000 mg | Freq: Every day | RECTAL | Status: DC
  Administered 2020-09-20 – 2020-09-22 (×3): 300 mg via RECTAL
  Filled 2020-09-20 (×3): qty 1

## 2020-09-20 NOTE — NC FL2 (Signed)
[place] MEDICAID FL2 LEVEL OF CARE SCREENING TOOL     IDENTIFICATION  Patient Name: Kendra Lee Birthdate: 19-Sep-1955 Sex: female Admission Date (Current Location): 09/17/2020  [facility] and [place] Number:  [employer] and Address:  [facility],  [address] [place] [address]      Provider Number: [id number]  Attending Physician Name and Address:  Catarina Hartshorn, MD  Relative Name and Phone Number:  Ramon Dredge - son - [phone]    Current Level of Care: Hospital Recommended Level of Care: Skilled Nursing Facility Prior Approval Number:    Date Approved/Denied:   PASRR Number:    Discharge Plan: SNF    Current Diagnoses: Patient Active Problem List   Diagnosis Date Noted   Acute metabolic encephalopathy 09/18/2020   CVA (cerebral vascular accident) (HCC) 09/18/2020   Leukocytosis 09/18/2020   Essential hypertension 09/18/2020    Orientation RESPIRATION BLADDER Height & Weight        O2 (2L) External catheter Weight: 42.1 kg Height:  5\' 1"  (154.9 cm)  BEHAVIORAL SYMPTOMS/MOOD NEUROLOGICAL BOWEL NUTRITION STATUS      Continent Diet (See DC summary)  AMBULATORY STATUS COMMUNICATION OF NEEDS Skin   Extensive Assist Verbally Normal                       Personal Care Assistance Level of Assistance  Bathing, Feeding, Dressing Bathing Assistance: Maximum assistance Feeding assistance: Maximum assistance Dressing Assistance: Maximum assistance     Functional Limitations Info  Sight, Hearing, Speech Sight Info: Adequate Hearing Info: Adequate Speech Info: Adequate    SPECIAL CARE FACTORS FREQUENCY  PT (By licensed PT)     PT Frequency: 5 times a week              Contractures Contractures Info: Not present    Additional Factors Info  Code Status, Allergies Code Status Info: Full Allergies Info: NKDA           Current Medications (09/20/2020):  This is the current hospital active medication list Current  Facility-Administered Medications  Medication Dose Route Frequency Provider Last Rate Last Admin   acetaminophen (TYLENOL) tablet 650 mg  650 mg Oral Q4H PRN Zierle-Ghosh, Asia B, DO       Or   acetaminophen (TYLENOL) 160 MG/5ML solution 650 mg  650 mg Per Tube Q4H PRN Zierle-Ghosh, Asia B, DO       Or   acetaminophen (TYLENOL) suppository 650 mg  650 mg Rectal Q4H PRN Zierle-Ghosh, Asia B, DO       amLODipine (NORVASC) tablet 5 mg  5 mg Oral Daily Shahmehdi, Seyed A, MD       aspirin tablet 325 mg  325 mg Oral Daily Zierle-Ghosh, Asia B, DO       Or   aspirin suppository 300 mg  300 mg Rectal Daily Zierle-Ghosh, Asia B, DO   300 mg at 09/19/20 0929   atorvastatin (LIPITOR) tablet 80 mg  80 mg Oral Daily Shahmehdi, Seyed A, MD       clopidogrel (PLAVIX) tablet 75 mg  75 mg Oral Q breakfast [place], Kofi, MD       dextrose 5 %-0.45 % sodium chloride infusion   Intravenous Continuous 09/21/20 A, MD 75 mL/hr at 09/20/20 0943 New Bag at 09/20/20 0943   heparin injection 5,000 Units  5,000 Units Subcutaneous Q8H Zierle-Ghosh, Asia B, DO   5,000 Units at 09/20/20 0535   hydrALAZINE (APRESOLINE) injection 10 mg  10 mg Intravenous Q4H PRN Nevin Bloodgood A, MD   10 mg at 09/19/20 2235   MEDLINE mouth rinse  15 mL Mouth Rinse BID Shahmehdi, Seyed A, MD   15 mL at 09/19/20 2024   thiamine (B-1) injection 100 mg  100 mg Intravenous Daily Beryle Beams, MD   100 mg at 09/20/20 5170     Discharge Medications: Please see discharge summary for a list of discharge medications.  Relevant Imaging Results:  Relevant Lab Results:   Additional Information    Leitha Bleak, RN

## 2020-09-20 NOTE — TOC Initial Note (Signed)
Transition of Care [facility]) - Initial/Assessment Note    Patient Details  Name: Kendra Lee MRN: [id number] Date of Birth: 1955-04-03  Transition of [facility]) CM/SW Contact:    Leitha Bleak, RN Phone Number: 09/20/2020, 2:02 PM  Clinical Narrative:            Patient admitted with Acute metabolic encephalopathy. PT is recommending SNF. TOC spoke with her son, Ramon Dredge. Patient has been vaccinated for COVID. He will try to find her vaccine card. Chart does not have SS#. TOC advised Ramon Dredge we need her SS# to get a PASSR#. TOC to follow.   Expected Discharge Plan: Skilled Nursing Facility Barriers to Discharge: Continued Medical Work up   Patient Goals and CMS Choice Patient states their goals for this hospitalization and ongoing recovery are:: to go to rehab. CMS Medicare.gov Compare Post Acute Care list provided to:: Patient Represenative (must comment) Choice offered to / list presented to : Adult Children  Expected Discharge Plan and Services Expected Discharge Plan: Skilled Nursing Facility       Living arrangements for the past 2 months: Single Family Home                                      Prior Living Arrangements/Services Living arrangements for the past 2 months: Single Family Home Lives with:: Self Patient language and need for interpreter reviewed:: Yes        Need for Family Participation in Patient Care: Yes (Comment) Care giver support system in place?: Yes (comment)   Criminal Activity/Legal Involvement Pertinent to Current Situation/Hospitalization: No - Comment as needed  Activities of Daily Living      Permission Sought/Granted      Share Information with NAME: Ramon Dredge     Permission granted to share info w Relationship: son     Emotional Assessment         Alcohol / Substance Use: Not Applicable Psych Involvement: No (comment)  Admission diagnosis:  CVA (cerebral vascular accident) (HCC) [I63.9] Altered mental status,  unspecified altered mental status type [R41.82] Acute metabolic encephalopathy [G93.41] Cerebrovascular accident (CVA), unspecified mechanism (HCC) [I63.9] Hypertension, unspecified type [I10] Patient Active Problem List   Diagnosis Date Noted   Acute metabolic encephalopathy 09/18/2020   CVA (cerebral vascular accident) (HCC) 09/18/2020   Leukocytosis 09/18/2020   Essential hypertension 09/18/2020   PCP:  Oneita Hurt, No Pharmacy:   [id number] - [place], [place] - [address] [place] [address] Phone: [phone] Fax: [phone]     Social Determinants of Health (SDOH) Interventions    Readmission Risk Interventions Readmission Risk Prevention Plan 09/20/2020  Medication Screening Complete  Transportation Screening Complete

## 2020-09-20 NOTE — Progress Notes (Signed)
Received report from [place], [place]. Assumed pt care at 0700. Alert to self. Pt acknowledges name when called. Pt drowsy. Suction at bedside. NPO. No oral medications were given d/t to the NPO diet order/speech eval recommendations. MD aware. No neurological changes from previous stroke symptoms. Voiding. Bowel sounds present. Lungs clear/diminished. O2 @ 2L/[place]. VSS. Permissive hypertension, 190/72. SCD's on. HOB greater 30*. Bed alarm on. Cal bell within reach. Will continue to monitor.

## 2020-09-20 NOTE — Progress Notes (Signed)
PROGRESS NOTE  Kendra Lee ULA:[id number] DOB: [birth year] DOA: 09/17/2020 PCP: Pcp, No  Brief History:  65 y.o. female, with no known medical history, who presents to the ED with altered mental status. History is quite limited 2/2 patient's acute encephalopathy. Earlier, family was able to provide some history to the ED provider. They reported that the patient's last known normal was 6/24. She has been somnolent, and not taking care of herself in that interval. Family was not aware of an EtOH or illicit drug use. They are not aware of an trauma, and there are not signs of trauma on exam. Family reported no infectious symptoms or fevers.  ED: Vitals stable, encephalopathic, CT head shows focal regions of hypoattenuation seen in the genu of the right internal capsule, posterior limb of the left internal capsule and right thalamus which could reflect age-indeterminate infarction.  No other acute abnormality    Assessment/Plan: Acute ischemic stroke/Acute metabolic Encephalopathy -case discussed with neurology, Dr. Eligah East most likely due to stroke -MR brain--scattered bilateral infarcts of thalamus -carotid US--R-ICA--50-69%; L-ICA <50% -MRA brain--no LVO -Echo (TTE)--EF 60-65%, no WMA, no PFO -A1C--5.5 -LDL--138 -continue ASA and plavix -discussed with cardiology--not a good candidate for TEE nor loop recorder -TSH--1.267 -ammonia 15 -B12--262 -UA--no pyuria  Hyperlipidemia -continue statin when safe to take po  Dysphagia -speech eval-->not safe for po intake -IR consult for PEG  Hypertension -allowing permissive hypertension for now        Status is: Inpatient  Remains inpatient appropriate because:Altered mental status  Dispo: The patient is from: Home              Anticipated d/c is to: SNF              Patient currently is not medically stable to d/c.   Difficult to place patient No   Total time spent 35 minutes.  Greater than  50% spent face to face counseling and coordinating care.       Family Communication:   no Family at bedside  Consultants:  neurology  Code Status:  FULL   DVT Prophylaxis:  [place] Heparin    Procedures: As Listed in Progress Note Above  Antibiotics: None       Subjective: Patient is awake.  Occasionally answers yes/no.  No vomiting or respiratory distress.  Objective: Vitals:   09/20/20 0600 09/20/20 0928 09/20/20 1000 09/20/20 1455  BP: (!) 204/75 (!) 190/71 (!) 202/78 (!) 174/74  Pulse: 69  67 60  Resp: 17  19   Temp: 99 F (37.2 C)  98.8 F (37.1 C) (!) 97.5 F (36.4 C)  TempSrc: Axillary  Oral Oral  SpO2: 98%   99%  Weight:      Height:        Intake/Output Summary (Last 24 hours) at 09/20/2020 1723 Last data filed at 09/20/2020 1500 Gross per 24 hour  Intake 280 ml  Output --  Net 280 ml   Weight change:  Exam:  General:  Pt is alert, not in acute distress HEENT: No icterus, No thrush, No neck mass, [place]/AT Cardiovascular: RRR, S1/S2, no rubs, no gallops Respiratory: bibasilar rales. No wheeze Abdomen: Soft/+BS, non tender, non distended, no guarding Extremities: No edema, No lymphangitis, No petechiae, No rashes, no synovitis   Data Reviewed: I have personally reviewed following labs and imaging studies Basic Metabolic Panel: Recent Labs  Lab 09/17/20 2200 09/18/20 0351  NA 140 140  K 3.5  3.6  CL 103 106  CO2 25 25  GLUCOSE 120* 149*  BUN 13 15  CREATININE 0.77 0.66  CALCIUM 9.5 9.2  MG  --  2.1   Liver Function Tests: Recent Labs  Lab 09/17/20 2200 09/18/20 0351  AST 19 16  ALT 13 12  ALKPHOS 77 66  BILITOT 0.7 0.6  PROT 7.5 6.6  ALBUMIN 4.2 3.7   No results for input(s): LIPASE, AMYLASE in the last 168 hours. Recent Labs  Lab 09/18/20 0002  AMMONIA 15   Coagulation Profile: No results for input(s): INR, PROTIME in the last 168 hours. CBC: Recent Labs  Lab 09/17/20 2200 09/18/20 0351  WBC 11.6* 9.8  NEUTROABS  8.9*  --   HGB 15.1* 13.8  HCT 45.0 41.0  MCV 93.8 94.0  PLT 308 301   Cardiac Enzymes: No results for input(s): CKTOTAL, CKMB, CKMBINDEX, TROPONINI in the last 168 hours. BNP: Invalid input(s): POCBNP CBG: No results for input(s): GLUCAP in the last 168 hours. HbA1C: Recent Labs    09/18/20 0350  HGBA1C 5.5   Urine analysis:    Component Value Date/Time   COLORURINE YELLOW 09/18/2020 0020   APPEARANCEUR CLEAR 09/18/2020 0020   LABSPEC 1.013 09/18/2020 0020   PHURINE 5.0 09/18/2020 0020   GLUCOSEU NEGATIVE 09/18/2020 0020   HGBUR SMALL (A) 09/18/2020 0020   BILIRUBINUR NEGATIVE 09/18/2020 0020   KETONESUR 20 (A) 09/18/2020 0020   PROTEINUR NEGATIVE 09/18/2020 0020   NITRITE NEGATIVE 09/18/2020 0020   LEUKOCYTESUR NEGATIVE 09/18/2020 0020   Sepsis Labs: @LABRCNTIP (procalcitonin:4,lacticidven:4) ) Recent Results (from the past 240 hour(s))  Culture, blood (routine x 2)     Status: None (Preliminary result)   Collection Time: 09/17/20 10:00 PM   Specimen: Right Antecubital; Blood  Result Value Ref Range Status   Specimen Description RIGHT ANTECUBITAL  Final   Special Requests   Final    BOTTLES DRAWN AEROBIC AND ANAEROBIC Blood Culture results may not be optimal due to an excessive volume of blood received in culture bottles   Culture   Final    NO GROWTH 2 DAYS Performed at [facility], [address], [place], [place]    Report Status PENDING  Incomplete  Resp Panel by RT-PCR (Flu A&B, Covid) Nasopharyngeal Swab     Status: None   Collection Time: 09/17/20 10:35 PM   Specimen: Nasopharyngeal Swab; Nasopharyngeal(NP) swabs in vial transport medium  Result Value Ref Range Status   SARS Coronavirus 2 by RT PCR NEGATIVE NEGATIVE Final    Comment: (NOTE) SARS-CoV-2 target nucleic acids are NOT DETECTED.  The SARS-CoV-2 RNA is generally detectable in upper respiratory specimens during the acute phase of infection. The lowest concentration of SARS-CoV-2  viral copies this assay can detect is 138 copies/mL. A negative result does not preclude SARS-Cov-2 infection and should not be used as the sole basis for treatment or other patient management decisions. A negative result may occur with  improper specimen collection/handling, submission of specimen other than nasopharyngeal swab, presence of viral mutation(s) within the areas targeted by this assay, and inadequate number of viral copies(<138 copies/mL). A negative result must be combined with clinical observations, patient history, and epidemiological information. The expected result is Negative.  Fact Sheet for Patients:  09/19/20  Fact Sheet for Healthcare Providers:  BloggerCourse.com  This test is no t yet approved or cleared by the [place] FDA and  has been authorized for detection and/or diagnosis of SARS-CoV-2 by FDA under an Emergency  Use Authorization (EUA). This EUA will remain  in effect (meaning this test can be used) for the duration of the COVID-19 declaration under Section 564(b)(1) of the Act, 21 U.S.C.section 360bbb-3(b)(1), unless the authorization is terminated  or revoked sooner.       Influenza A by PCR NEGATIVE NEGATIVE Final   Influenza B by PCR NEGATIVE NEGATIVE Final    Comment: (NOTE) The Xpert Xpress SARS-CoV-2/FLU/RSV plus assay is intended as an aid in the diagnosis of influenza from Nasopharyngeal swab specimens and should not be used as a sole basis for treatment. Nasal washings and aspirates are unacceptable for Xpert Xpress SARS-CoV-2/FLU/RSV testing.  Fact Sheet for Patients: BloggerCourse.com  Fact Sheet for Healthcare Providers: SeriousBroker.it  This test is not yet approved or cleared by the [place] FDA and has been authorized for detection and/or diagnosis of SARS-CoV-2 by FDA under an Emergency Use Authorization  (EUA). This EUA will remain in effect (meaning this test can be used) for the duration of the COVID-19 declaration under Section 564(b)(1) of the Act, 21 U.S.C. section 360bbb-3(b)(1), unless the authorization is terminated or revoked.  Performed at [facility], [address] [place], [place] [address]   Culture, blood (routine x 2)     Status: None (Preliminary result)   Collection Time: 09/18/20 12:01 AM   Specimen: BLOOD LEFT ARM  Result Value Ref Range Status   Specimen Description BLOOD LEFT ARM  Final   Special Requests   Final    BOTTLES DRAWN AEROBIC AND ANAEROBIC Blood Culture adequate volume   Culture   Final    NO GROWTH 1 DAY Performed at [facility], [address] [place], [place] [address]    Report Status PENDING  Incomplete     Scheduled Meds:  amLODipine  5 mg Oral Daily   aspirin  300 mg Rectal Daily   atorvastatin  80 mg Oral Daily   clopidogrel  75 mg Oral Q breakfast   heparin  5,000 Units Subcutaneous Q8H   mouth rinse  15 mL Mouth Rinse BID   thiamine injection  100 mg Intravenous Daily   Continuous Infusions:  dextrose 5 % and 0.45% NaCl 75 mL/hr at 09/20/20 0981    Procedures/Studies: CT Head Wo Contrast  Result Date: 09/17/2020 CLINICAL DATA:  Mental status change, found this afternoon on couch EXAM: CT HEAD WITHOUT CONTRAST TECHNIQUE: Contiguous axial images were obtained from the base of the skull through the vertex without intravenous contrast. COMPARISON:  None. FINDINGS: Brain: Foci of hypoattenuation present in the right internal capsule as as a separate focus in the right caudate and posterior limb of the left internal capsule could reflect sequela of age-indeterminate lacunar type infarcts difficult to fully assess given a background of more diffuse patchy white matter hypoattenuation typically indicative of microvascular angiopathy in a patient of this age. Additional hypoattenuation in the pons and brainstem is more nonspecific given  streak artifact across skull base. Symmetric prominence of the ventricles, cisterns and sulci compatible with parenchymal volume loss. No hyperdense hemorrhage. No mass effect or midline shift. No extra-axial collection. Scattered benign dural calcifications. Vascular: Atherosclerotic calcification of the carotid siphons. No hyperdense vessel. Skull: No calvarial fracture or suspicious osseous lesion. No scalp swelling or hematoma. Sinuses/Orbits: Paranasal sinuses and mastoid air cells are predominantly clear. Other: Included orbital structures are unremarkable. IMPRESSION: Focal regions of hypoattenuation are seen in the genu of the right internal capsule, posterior limb of the left internal capsule and right thalamus which  could reflect age-indeterminate infarction, particularly in the absence of comparison imaging and on a background of likely microvascular angiopathy. Could be further characterized with MR imaging as warranted. No other acute intracranial abnormality. These results were called by telephone at the time of interpretation on 09/17/2020 at 11:45 pm to provider Benjiman Core , who verbally acknowledged these results. Electronically Signed   By: Kreg Shropshire M.D.   On: 09/17/2020 23:45   MR ANGIO HEAD WO CONTRAST  Result Date: 09/18/2020 CLINICAL DATA:  Altered mental status. EXAM: MRI HEAD WITHOUT CONTRAST MRA HEAD WITHOUT CONTRAST TECHNIQUE: Multiplanar, multi-echo pulse sequences of the brain and surrounding structures were acquired without intravenous contrast. Angiographic images of the Circle of Willis were acquired using MRA technique without intravenous contrast. COMPARISON:  No pertinent prior exam. FINDINGS: MRI HEAD FINDINGS Brain: Restricted diffusion in the bilateral thalamus, bilateral internal capsule, left centrum semiovale, and right subcortical frontal. These have the appearance of acute infarcts. Mild small vessel ischemic type change in the pons and hemispheric white matter.  No evidence of hemorrhage, hydrocephalus, or collection. Vascular: Preserved flow voids. Skull and upper cervical spine: Normal marrow signal Sinuses/Orbits: Negative Other: Significant and progressive motion artifact MRA HEAD FINDINGS Very motion degraded. Carotid, vertebral, and basilar arteries and major branches are patent with gross symmetry. IMPRESSION: Brain MRI: 1. 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 These are not in a unified arterial or venous distribution and are of uncertain cause. No revealing background chronic brain findings. 2. Significantly motion degraded Intracranial MRA: Very motion degraded and of limited utility other than documenting patency of major vessels. Electronically Signed   By: Marnee Spring M.D.   On: 09/18/2020 08:43   MR BRAIN WO CONTRAST  Result Date: 09/18/2020 CLINICAL DATA:  Altered mental status. EXAM: MRI HEAD WITHOUT CONTRAST MRA HEAD WITHOUT CONTRAST TECHNIQUE: Multiplanar, multi-echo pulse sequences of the brain and surrounding structures were acquired without intravenous contrast. Angiographic images of the Circle of Willis were acquired using MRA technique without intravenous contrast. COMPARISON:  No pertinent prior exam. FINDINGS: MRI HEAD FINDINGS Brain: Restricted diffusion in the bilateral thalamus, bilateral internal capsule, left centrum semiovale, and right subcortical frontal. These have the appearance of acute infarcts. Mild small vessel ischemic type change in the pons and hemispheric white matter. No evidence of hemorrhage, hydrocephalus, or collection. Vascular: Preserved flow voids. Skull and upper cervical spine: Normal marrow signal Sinuses/Orbits: Negative Other: Significant and progressive motion artifact MRA HEAD FINDINGS Very motion degraded. Carotid, vertebral, and basilar arteries and major branches are patent with gross symmetry. IMPRESSION: Brain MRI:  1. 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 These are not in a unified arterial or venous distribution and are of uncertain cause. No revealing background chronic brain findings. 2. Significantly motion degraded Intracranial MRA: Very motion degraded and of limited utility other than documenting patency of major vessels. Electronically Signed   By: Marnee Spring M.D.   On: 09/18/2020 08:43   US Carotid Bilateral (at [facility] and [facility] only)  Result Date: 09/18/2020 CLINICAL DATA:  Altered mental status Hypertension Stroke EXAM: BILATERAL CAROTID DUPLEX ULTRASOUND TECHNIQUE: Wallace Cullens scale imaging, color Doppler and duplex ultrasound were performed of bilateral carotid and vertebral arteries in the neck. COMPARISON:  None. FINDINGS: Criteria: Quantification of carotid stenosis is based on velocity parameters that correlate the residual internal carotid diameter with NASCET-based stenosis levels, using the diameter of  the distal internal carotid lumen as the denominator for stenosis measurement. The following velocity measurements were obtained: RIGHT ICA: 156/29 cm/sec CCA: 77/11 cm/sec SYSTOLIC ICA/CCA RATIO:  2.0 ECA: 173 cm/sec LEFT ICA: 83/18 cm/sec CCA: 85/14 cm/sec SYSTOLIC ICA/CCA RATIO:  1.0 ECA: 92 cm/sec RIGHT CAROTID ARTERY: Heterogeneous calcified and noncalcified plaque of the distal common and proximal internal carotid arteries with velocity indicative of 50-69% stenosis. RIGHT VERTEBRAL ARTERY:  Antegrade flow. LEFT CAROTID ARTERY: Heterogeneous calcified and noncalcified plaque of the distal common and proximal internal carotid arteries with velocity parameters indicative of less than 50% stenosis. LEFT VERTEBRAL ARTERY:  Antegrade flow. IMPRESSION: 1. 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 2. Less than 50% stenosis of the left internal carotid artery. 3. Long segment shadowing plaque present in both common  and internal carotid arteries, which can obscure higher velocities. Further evaluation with MRI or CT of the neck should be performed for more precise evaluation of degree of stenosis. Electronically Signed   By: Acquanetta Belling M.D.   On: 09/18/2020 10:01   DG Chest Portable 1 View  Result Date: 09/17/2020 CLINICAL DATA:  Altered mental status EXAM: PORTABLE CHEST 1 VIEW COMPARISON:  None FINDINGS: There is hyperinflation of the lungs compatible with COPD. Heart and mediastinal contours are within normal limits. No focal opacities or effusions. No acute bony abnormality. IMPRESSION: COPD.  No active disease. Electronically Signed   By: Charlett Nose M.D.   On: 09/17/2020 23:02   ECHOCARDIOGRAM COMPLETE  Result Date: 09/18/2020    ECHOCARDIOGRAM REPORT   Patient Name:   [facility] Date of Exam: 09/18/2020 Medical Rec #:  [id number]       Height: Accession #:    [id number]      Weight: Date of Birth:  [birth year]       BSA: Patient Age:    64 years        BP:           213/100 mmHg Patient Gender: F               HR:           59 bpm. [facility] Procedure: 2D Echo, Cardiac Doppler and Color Doppler Indications:    Stroke  History:        Patient has no prior history of Echocardiogram examinations.                 Stroke; Risk Factors:Hypertension. Height and Weight not                 available.  Sonographer:    Mikki Harbor Referring Phys: [id number] ASIA B ZIERLE-GHOSH  Sonographer Comments: Height and Weight not available IMPRESSIONS  1. Left ventricular ejection fraction, by estimation, is 60 to 65%. The left ventricle has normal function. The left ventricle has no regional wall motion abnormalities. There is mild left ventricular hypertrophy. Left ventricular diastolic parameters were normal.  2. Right ventricular systolic function is normal. The right ventricular size is normal. Tricuspid regurgitation signal is inadequate for assessing PA pressure.  3. The mitral valve is grossly normal.  Trivial mitral valve regurgitation.  4. The aortic valve is tricuspid. Aortic valve regurgitation is not visualized. Mild aortic valve sclerosis is present, with no evidence of aortic valve stenosis. Aortic valve mean gradient measures 3.0 mmHg.  5. The inferior vena cava is normal in size with greater than 50% respiratory variability, suggesting right atrial pressure of 3  mmHg. FINDINGS  Left Ventricle: Left ventricular ejection fraction, by estimation, is 60 to 65%. The left ventricle has normal function. The left ventricle has no regional wall motion abnormalities. The left ventricular internal cavity size was normal in size. There is  mild left ventricular hypertrophy. Left ventricular diastolic parameters were normal. Right Ventricle: The right ventricular size is normal. No increase in right ventricular wall thickness. Right ventricular systolic function is normal. Tricuspid regurgitation signal is inadequate for assessing PA pressure. Left Atrium: Left atrial size was normal in size. Right Atrium: Right atrial size was normal in size. Pericardium: There is no evidence of pericardial effusion. Presence of pericardial fat pad. Mitral Valve: The mitral valve is grossly normal. Mild mitral annular calcification. Trivial mitral valve regurgitation. MV peak gradient, 2.7 mmHg. The mean mitral valve gradient is 1.0 mmHg. Tricuspid Valve: The tricuspid valve is grossly normal. Tricuspid valve regurgitation is trivial. Aortic Valve: The aortic valve is tricuspid. There is mild aortic valve annular calcification. Aortic valve regurgitation is not visualized. Mild aortic valve sclerosis is present, with no evidence of aortic valve stenosis. Aortic valve mean gradient measures 3.0 mmHg. Aortic valve peak gradient measures 5.7 mmHg. Aortic valve area, by VTI measures 2.71 cm. Pulmonic Valve: The pulmonic valve was grossly normal. Pulmonic valve regurgitation is trivial. Aorta: The aortic root is normal in size and  structure. Venous: The inferior vena cava is normal in size with greater than 50% respiratory variability, suggesting right atrial pressure of 3 mmHg. IAS/Shunts: The interatrial septum appears to be lipomatous. No atrial level shunt detected by color flow Doppler.  LEFT VENTRICLE PLAX 2D LVIDd:         4.38 cm  Diastology LVIDs:         2.91 cm  LV e' medial:    7.10 cm/s LV PW:         1.04 cm  LV E/e' medial:  11.9 LV IVS:        1.06 cm  LV e' lateral:   7.62 cm/s LVOT diam:     2.00 cm  LV E/e' lateral: 11.0 LV SV:         90 LVOT Area:     3.14 cm  RIGHT VENTRICLE RV Basal diam:  3.37 cm RV Mid diam:    3.06 cm RV S prime:     10.20 cm/s TAPSE (M-mode): 2.3 cm LEFT ATRIUM             RIGHT ATRIUM LA diam:        3.00 cm RA Area:     13.30 cm LA Vol (A2C):   49.5 ml RA Volume:   31.30 ml LA Vol (A4C):   28.7 ml LA Biplane Vol: 40.5 ml  AORTIC VALVE AV Area (Vmax):    2.85 cm AV Area (Vmean):   2.55 cm AV Area (VTI):     2.71 cm AV Vmax:           119.00 cm/s AV Vmean:          86.100 cm/s AV VTI:            0.330 m AV Peak Grad:      5.7 mmHg AV Mean Grad:      3.0 mmHg LVOT Vmax:         108.00 cm/s LVOT Vmean:        69.800 cm/s LVOT VTI:          0.285 m LVOT/AV VTI ratio: 0.86  AORTA Ao Root diam: 3.30 cm MITRAL VALVE MV Area (PHT): 3.15 cm    SHUNTS MV Area VTI:   2.54 cm    Systemic VTI:  0.29 m MV Peak grad:  2.7 mmHg    Systemic Diam: 2.00 cm MV Mean grad:  1.0 mmHg MV Vmax:       0.81 m/s MV Vmean:      41.2 cm/s MV Decel Time: 241 msec MV E velocity: 84.20 cm/s MV A velocity: 72.70 cm/s MV E/A ratio:  1.16 Nona Dell MD Electronically signed by Nona Dell MD Signature Date/Time: 09/18/2020/2:03:27 PM    Final     Catarina Hartshorn, DO  [facility]  If 7PM-7AM, please contact night-coverage [email or web address] Password [facility] 09/20/2020, 5:23 PM   LOS: 2 days

## 2020-09-20 NOTE — Progress Notes (Signed)
Pt resting in bed. A&O. Pt continues to have copious amounts of oral secretions d/t severe dysphagia. Oral suction provided. MD aware, request for Scopolamine patch. Family at bedside. No change in previous neurological symptoms. Cardiology consult in process. Voiding. Bed alarm on. Call bell within reach. Will continue to monitor.

## 2020-09-20 NOTE — Progress Notes (Signed)
Palliative: Mrs. Lefevers is lying quietly in bed on her right side.  She opens her right eye and makes and somewhat keeps eye contact.  She is drooling, and does not seem to be managing her oral secretions at all.  She does not respond verbally to my orientation questions.  I do not believe that she is able to make her basic needs known.  There is no family at bedside at this time.  Call to son, Debbora Dus.  Ward states that he was able to see his mother yesterday and he feels that she is about the same.  He tells me that at this point, family remains hopeful for some improvements.  He states they would like for her to go to rehab.  We talked about her weakness and inability to eat.  I shared that she must have a diet in order to discharge to rehab.  Ramon Dredge states that although they would not want a PEG tube long-term to keep her alive, they would take a temporary feeding tube to give her time for improvements.  I share that PEG tube must remain in place for minimum of 3 months.  I share that temporary PEG tube feeding does not change Kendra Lee's ability to control her oral secretions and she continues to be at risk for aspiration pneumonia.  Ramon Dredge states understanding.  We talked about CODE STATUS.  Ramon Dredge shares that although they have had some discussions about CODE STATUS they are not ready to make Mrs. Monroy DNR.  He states that family will continue CODE STATUS discussions.  Conference with attending, bedside nursing staff, transition of care team related to patient condition, needs, goals of care, CODE STATUS.  Plan:    Continue full scope/full code.  Short-term rehab, possible need for long-term care.  Would accept temporary PEG tube placement.  35 minutes  Lillia Carmel, NP Palliative medicine team Team phone [phone] Greater than 50% of this time was spent counseling and coordinating care related to the above assessment and plan.

## 2020-09-21 ENCOUNTER — Inpatient Hospital Stay (HOSPITAL_COMMUNITY): Payer: Medicare Other

## 2020-09-21 DIAGNOSIS — I10 Essential (primary) hypertension: Secondary | ICD-10-CM | POA: Diagnosis not present

## 2020-09-21 DIAGNOSIS — Z7189 Other specified counseling: Secondary | ICD-10-CM | POA: Diagnosis not present

## 2020-09-21 DIAGNOSIS — G9341 Metabolic encephalopathy: Secondary | ICD-10-CM | POA: Diagnosis not present

## 2020-09-21 DIAGNOSIS — I639 Cerebral infarction, unspecified: Secondary | ICD-10-CM | POA: Diagnosis not present

## 2020-09-21 DIAGNOSIS — Z515 Encounter for palliative care: Secondary | ICD-10-CM | POA: Diagnosis not present

## 2020-09-21 LAB — AMMONIA: Ammonia: 26 umol/L (ref 9–35)

## 2020-09-21 LAB — BASIC METABOLIC PANEL
Anion gap: 12 (ref 5–15)
BUN: 8 mg/dL (ref 8–23)
CO2: 25 mmol/L (ref 22–32)
Calcium: 8.6 mg/dL — ABNORMAL LOW (ref 8.9–10.3)
Chloride: 100 mmol/L (ref 98–111)
Creatinine, Ser: 0.63 mg/dL (ref 0.44–1.00)
GFR, Estimated: >60 mL/min (ref >60)
Glucose, Bld: 110 mg/dL — ABNORMAL HIGH (ref 70–99)
Potassium: 2.9 mmol/L — ABNORMAL LOW (ref 3.5–5.1)
Sodium: 137 mmol/L (ref 135–145)

## 2020-09-21 LAB — CBC
HCT: 40.2 % (ref 36.0–46.0)
Hemoglobin: 13.5 g/dL (ref 12.0–15.0)
MCH: 32 pg (ref 26.0–34.0)
MCHC: 33.6 g/dL (ref 30.0–36.0)
MCV: 95.3 fL (ref 80.0–100.0)
Platelets: 269 10*3/uL (ref 150–400)
RBC: 4.22 MIL/uL (ref 3.87–5.11)
RDW: 12.8 % (ref 11.5–15.5)
WBC: 7.1 10*3/uL (ref 4.0–10.5)
nRBC: 0 % (ref 0.0–0.2)

## 2020-09-21 LAB — VITAMIN B12: Vitamin B-12: 1750 pg/mL — ABNORMAL HIGH (ref 180–914)

## 2020-09-21 LAB — FOLATE: Folate: 4.8 ng/mL — ABNORMAL LOW (ref >5.9)

## 2020-09-21 LAB — TSH: TSH: 1.927 u[IU]/mL (ref 0.350–4.500)

## 2020-09-21 LAB — T4, FREE: Free T4: 1.03 ng/dL (ref 0.61–1.12)

## 2020-09-21 MED ORDER — CEFAZOLIN SODIUM-DEXTROSE 2-4 GM/100ML-% IV SOLN
2.0000 g | INTRAVENOUS | Status: AC
  Administered 2020-09-22: 2 g via INTRAVENOUS

## 2020-09-21 MED ORDER — HYDRALAZINE HCL 20 MG/ML IJ SOLN
5.0000 mg | Freq: Four times a day (QID) | INTRAMUSCULAR | Status: DC | PRN
  Administered 2020-09-21 – 2020-09-30 (×6): 5 mg via INTRAVENOUS
  Filled 2020-09-21 (×6): qty 1

## 2020-09-21 MED ORDER — HYDRALAZINE HCL 20 MG/ML IJ SOLN
INTRAMUSCULAR | Status: AC
  Filled 2020-09-21: qty 1

## 2020-09-21 MED ORDER — KCL IN DEXTROSE-NACL 20-5-0.9 MEQ/L-%-% IV SOLN: INTRAVENOUS | Status: DC

## 2020-09-21 MED ORDER — POTASSIUM CHLORIDE 10 MEQ/100ML IV SOLN
10.0000 meq | INTRAVENOUS | Status: AC
  Administered 2020-09-21 (×4): 10 meq via INTRAVENOUS
  Filled 2020-09-21 (×4): qty 100

## 2020-09-21 MED ORDER — SODIUM CHLORIDE 0.9 % IV SOLN
1.0000 mg | Freq: Every day | INTRAVENOUS | Status: DC
  Administered 2020-09-21 – 2020-10-01 (×11): 1 mg via INTRAVENOUS
  Filled 2020-09-21 (×15): qty 0.2

## 2020-09-21 MED ORDER — HEPARIN SODIUM (PORCINE) 5000 UNIT/ML IJ SOLN
5000.0000 [IU] | Freq: Three times a day (TID) | INTRAMUSCULAR | Status: DC
  Administered 2020-09-23 – 2020-10-09 (×48): 5000 [IU] via SUBCUTANEOUS
  Filled 2020-09-21 (×47): qty 1

## 2020-09-21 NOTE — Progress Notes (Signed)
Palliative: Kendra Lee is lying quietly in bed on her right side.  She will open her eyes, and briefly make but not really keep eye contact.  She will mouths answers to my question, but is difficult to understand.  I do not believe that she can make her basic needs known.  There is no family at bedside at this time.  Call to son, Kendra Lee.  We talked about cardiology consult.  Kendra Lee shares that he has no questions at this time.  We talked about time for outcomes, and future testing as needed/warranted.  We talked about interventional radiology for PEG tube placement.  He tells me that he just spoke with someone who said that this should be accomplished tomorrow.  I again shared that this tube must remain in place for a minimum of 3 months.  We also talked about short-term rehab," discharge means starting rehab.  He states understanding and agreement that he would like his mother to start rehab soon as possible.  We talked about outpatient palliative.  Kendra Lee shares that he is agreeable to outpatient palliative services to continue goals of care discussions.  Conference with attending, bedside nursing staff, speech therapy, transition of care team related to patient condition, needs, goals of care, outpatient palliative.  Plan:    Full scope/full code.  PEG tube placement.  Short-term rehab.  Outpatient palliative services to follow.  40 minutes  Kendra Carmel, NP Palliative medicine team Team phone [phone] Greater than 50% of this time was spent counseling and coordinating care related to the above assessment and plan.

## 2020-09-21 NOTE — Consult Note (Signed)
Chief Complaint: Patient was seen in consultation today for percutaneous gastric tube placement Chief Complaint  Patient presents with   Altered Mental Status   at the request of Dr D Tat   Supervising Physician: Ruel Favors  Patient Status: AP IP  History of Present Illness: Kendra Lee is a 65 y.o. female   No prior medical hx To ED 09/18/20 with AMS Last normal 6/24 per family No known trauma Confused- nonverbal; does not follow commands  Brain MRI: 1. 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 These are not in a unified arterial or venous distribution and are of uncertain cause. No revealing background chronic brain findings. 2. Significantly motion degraded  Dysphagia; does not follow commands Need for long term care    Dr Tat note yesterday Acute ischemic stroke/Acute metabolic Encephalopathy -case discussed with neurology, Dr. Eligah East most likely due to stroke Dysphagia -speech eval-->not safe for po intake  Request for percutaneous gastric tube  Family decides on full code and full scope of treatment per chart Dr Grace Isaac has approved anatomy for procedure    Allergies: Patient has no known allergies.  Medications: Prior to Admission medications   Not on File     History reviewed. No pertinent family history.  Social History   Socioeconomic History   Marital status: Legally Separated    Spouse name: Not on file   Number of children: Not on file   Years of education: Not on file   Highest education level: Not on file  Occupational History   Not on file  Tobacco Use   Smoking status: Not on file   Smokeless tobacco: Not on file  Substance and Sexual Activity   Alcohol use: Not on file   Drug use: Not on file   Sexual activity: Not on file  Other Topics Concern   Not on file  Social History Narrative   Not on file   Social  Determinants of Health   Financial Resource Strain: Not on file  Food Insecurity: Not on file  Transportation Needs: Not on file  Physical Activity: Not on file  Stress: Not on file  Social Connections: Not on file    Review of Systems: A 12 point ROS discussed and pertinent positives are indicated in the HPI above.  All other systems are negative.    Vital Signs: BP (!) 170/63   Pulse (!) 52   Temp 98.2 F (36.8 C) (Oral)   Resp 17   Ht 5\' 1"  (1.549 m)   Wt 92 lb 13 oz (42.1 kg)   SpO2 96%   BMI 17.54 kg/m   Physical Exam Vitals reviewed.  Constitutional:      Comments: Thin ill appearing female  Cardiovascular:     Rate and Rhythm: Normal rate and regular rhythm.  Pulmonary:     Breath sounds: Normal breath sounds.  Abdominal:     General: There is no distension.     Palpations: Abdomen is soft.  Musculoskeletal:     Comments: Moving all 4s - not to command  Skin:    General: Skin is warm.  Neurological:     Comments: Does not follow commands  Psychiatric:     Comments: Consented for G tube with son via phone    Imaging: CT ABDOMEN WO CONTRAST  Result Date: 09/21/2020 CLINICAL DATA:  Evaluate anatomy prior to potential percutaneous gastrostomy tube placement. EXAM: CT ABDOMEN WITHOUT  CONTRAST TECHNIQUE: Multidetector CT imaging of the abdomen was performed following the standard protocol without IV contrast. COMPARISON:  None. FINDINGS: The lack of intravenous contrast limits the ability to evaluate solid abdominal organs. Lower chest: Limited visualization of the lower thorax demonstrates minimal dependent subpleural atelectasis. Normal heart size. No pericardial effusion. Hepatobiliary: Normal hepatic contour. Normal noncontrast appearance of the gallbladder given degree distention. No radiopaque gallstones. There is a trace amount of perihepatic ascites. Pancreas: Normal noncontrast appearance of the pancreas. Spleen: Normal noncontrast appearance  of the spleen. 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 Normal noncontrast appearance of the right kidney. No renal stones. No right-sided urinary obstruction. Normal noncontrast appearance the bilateral adrenal glands. The urinary bladder was not imaged. Stomach/Bowel: The anterior wall the stomach is well apposed against the ventral wall of the abdomen without interposition of the hepatic parenchyma or the transverse colon. Moderate colonic stool burden without evidence of enteric obstruction. No pneumoperitoneum, pneumatosis or portal venous gas. Vascular/Lymphatic: Large amount of irregular calcified atherosclerotic plaque within a bilobed ectatic abdominal aorta with dominant caudal component measuring 2.6 cm and maximal oblique short axis coronal diameter (coronal image 33, series 5). No bulky retroperitoneal or mesenteric lymphadenopathy. Other: Regional soft tissues appear normal. Musculoskeletal: No acute or aggressive osseous abnormalities. Mild DDD of L4-L5 with disc space height loss, endplate irregularity and sclerosis. IMPRESSION: 1. Gastric anatomy amenable to potential percutaneous gastrostomy tube placement as indicated. 2. 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 3. Mildly ectatic abdominal aorta measuring 2.6 cm in maximal diameter. Recommend follow-up aortic ultrasound in 5 years. This recommendation follows ACR consensus guidelines: White Paper of the ACR Incidental Findings Committee II on Vascular Findings. J Am Coll Radiol 2013; 10:789-794. 4. Aortic aneurysm NOS (ICD10-I71.9). Electronically Signed   By: Simonne Come M.D.   On: 09/21/2020 09:36   CT Head Wo Contrast  Result Date: 09/17/2020 CLINICAL DATA:  Mental status change, found this afternoon on couch EXAM: CT HEAD WITHOUT CONTRAST TECHNIQUE: Contiguous axial images were  obtained from the base of the skull through the vertex without intravenous contrast. COMPARISON:  None. FINDINGS: Brain: Foci of hypoattenuation present in the right internal capsule as as a separate focus in the right caudate and posterior limb of the left internal capsule could reflect sequela of age-indeterminate lacunar type infarcts difficult to fully assess given a background of more diffuse patchy white matter hypoattenuation typically indicative of microvascular angiopathy in a patient of this age. Additional hypoattenuation in the pons and brainstem is more nonspecific given streak artifact across skull base. Symmetric prominence of the ventricles, cisterns and sulci compatible with parenchymal volume loss. No hyperdense hemorrhage. No mass effect or midline shift. No extra-axial collection. Scattered benign dural calcifications. Vascular: Atherosclerotic calcification of the carotid siphons. No hyperdense vessel. Skull: No calvarial fracture or suspicious osseous lesion. No scalp swelling or hematoma. Sinuses/Orbits: Paranasal sinuses and mastoid air cells are predominantly clear. Other: Included orbital structures are unremarkable. 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 Could be further characterized with MR imaging as warranted. No other acute intracranial abnormality. These results were called by telephone at the time of interpretation on 09/17/2020 at 11:45 pm to provider [facility]  PICKERING , who verbally acknowledged these results. Electronically Signed   By: Kreg Shropshire M.D.   On: 09/17/2020 23:45   MR ANGIO HEAD WO CONTRAST  Result Date: 09/18/2020 CLINICAL DATA:  Altered mental status. EXAM: MRI HEAD WITHOUT CONTRAST MRA  HEAD WITHOUT CONTRAST TECHNIQUE: Multiplanar, multi-echo pulse sequences of the brain and surrounding structures were acquired without intravenous contrast. Angiographic images of the Circle of Willis were acquired using MRA technique without intravenous contrast. COMPARISON:  No pertinent prior exam. FINDINGS: MRI HEAD FINDINGS Brain: Restricted diffusion in the bilateral thalamus, bilateral internal capsule, left centrum semiovale, and right subcortical frontal. These have the appearance of acute infarcts. Mild small vessel ischemic type change in the pons and hemispheric white matter. No evidence of hemorrhage, hydrocephalus, or collection. Vascular: Preserved flow voids. Skull and upper cervical spine: Normal marrow signal Sinuses/Orbits: Negative Other: Significant and progressive motion artifact MRA HEAD FINDINGS Very motion degraded. Carotid, vertebral, and basilar arteries and major branches are patent with gross symmetry. IMPRESSION: Brain MRI: 1. 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 These are not in a unified arterial or venous distribution and are of uncertain cause. No revealing background chronic brain findings. 2. Significantly motion degraded Intracranial MRA: Very motion degraded and of limited utility other than documenting patency of major vessels. Electronically Signed   By: Marnee Spring M.D.   On: 09/18/2020 08:43   MR BRAIN WO CONTRAST  Result Date: 09/18/2020 CLINICAL DATA:  Altered mental status. EXAM: MRI HEAD WITHOUT CONTRAST MRA HEAD WITHOUT CONTRAST TECHNIQUE: Multiplanar, multi-echo pulse sequences of the brain and surrounding structures were acquired without intravenous contrast. Angiographic images of the Circle of Willis were acquired using MRA technique without intravenous contrast. COMPARISON:  No pertinent prior exam. FINDINGS: MRI HEAD FINDINGS Brain: Restricted diffusion in the  bilateral thalamus, bilateral internal capsule, left centrum semiovale, and right subcortical frontal. These have the appearance of acute infarcts. Mild small vessel ischemic type change in the pons and hemispheric white matter. No evidence of hemorrhage, hydrocephalus, or collection. Vascular: Preserved flow voids. Skull and upper cervical spine: Normal marrow signal Sinuses/Orbits: Negative Other: Significant and progressive motion artifact MRA HEAD FINDINGS Very motion degraded. Carotid, vertebral, and basilar arteries and major branches are patent with gross symmetry. IMPRESSION: Brain MRI: 1. 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 These are not in a unified arterial or venous distribution and are of uncertain cause. No revealing background chronic brain findings. 2. Significantly motion degraded Intracranial MRA: Very motion degraded and of limited utility other than documenting patency of major vessels. Electronically Signed   By: Marnee Spring M.D.   On: 09/18/2020 08:43   US Carotid Bilateral (at [facility] and AP only)  Result Date: 09/18/2020 CLINICAL DATA:  Altered mental status Hypertension Stroke EXAM: BILATERAL CAROTID DUPLEX ULTRASOUND TECHNIQUE: Wallace Cullens scale imaging, color Doppler and duplex ultrasound were performed of bilateral carotid and vertebral arteries in the neck. COMPARISON:  None. FINDINGS: Criteria: Quantification of carotid stenosis is based on velocity parameters that correlate the residual internal carotid diameter with NASCET-based stenosis levels, using the diameter of the distal internal carotid lumen as the denominator for stenosis measurement. The following velocity measurements were obtained: RIGHT ICA: 156/29 cm/sec CCA: 77/11 cm/sec SYSTOLIC ICA/CCA RATIO:  2.0 ECA: 173 cm/sec LEFT ICA: 83/18 cm/sec CCA: 85/14 cm/sec SYSTOLIC ICA/CCA RATIO:  1.0 ECA: 92 cm/sec RIGHT CAROTID ARTERY:  Heterogeneous  calcified and noncalcified plaque of the distal common and proximal internal carotid arteries with velocity indicative of 50-69% stenosis. RIGHT VERTEBRAL ARTERY:  Antegrade flow. LEFT CAROTID ARTERY: Heterogeneous calcified and noncalcified plaque of the distal common and proximal internal carotid arteries with velocity parameters indicative of less than 50% stenosis. LEFT VERTEBRAL ARTERY:  Antegrade flow. IMPRESSION: 1. 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 2. Less than 50% stenosis of the left internal carotid artery. 3. Long segment shadowing plaque present in both common and internal carotid arteries, which can obscure higher velocities. Further evaluation with MRI or CT of the neck should be performed for more precise evaluation of degree of stenosis. Electronically Signed   By: Acquanetta Belling M.D.   On: 09/18/2020 10:01   DG Chest Portable 1 View  Result Date: 09/17/2020 CLINICAL DATA:  Altered mental status EXAM: PORTABLE CHEST 1 VIEW COMPARISON:  None FINDINGS: There is hyperinflation of the lungs compatible with COPD. Heart and mediastinal contours are within normal limits. No focal opacities or effusions. No acute bony abnormality. IMPRESSION: COPD.  No active disease. Electronically Signed   By: Charlett Nose M.D.   On: 09/17/2020 23:02   ECHOCARDIOGRAM COMPLETE  Result Date: 09/18/2020    ECHOCARDIOGRAM REPORT   Patient Name:   [facility] Date of Exam: 09/18/2020 Medical Rec #:  [id number]       Height: Accession #:    [id number]      Weight: Date of Birth:  Jan 06, 1956       BSA: Patient Age:    64 years        BP:           213/100 mmHg Patient Gender: F               HR:           59 bpm. [facility] Procedure: 2D Echo, Cardiac Doppler and Color Doppler Indications:    Stroke  History:        Patient has no prior history of Echocardiogram examinations.                 Stroke; Risk Factors:Hypertension. Height and Weight not                  available.  Sonographer:    Mikki Harbor Referring Phys: [id number] ASIA B ZIERLE-GHOSH  Sonographer Comments: Height and Weight not available IMPRESSIONS  1. Left ventricular ejection fraction, by estimation, is 60 to 65%. The left ventricle has normal function. The left ventricle has no regional wall motion abnormalities. There is mild left ventricular hypertrophy. Left ventricular diastolic parameters were normal.  2. Right ventricular systolic function is normal. The right ventricular size is normal. Tricuspid regurgitation signal is inadequate for assessing PA pressure.  3. The mitral valve is grossly normal. Trivial mitral valve regurgitation.  4. The aortic valve is tricuspid. Aortic valve regurgitation is not visualized. Mild aortic valve sclerosis is present, with no evidence of aortic valve stenosis. Aortic valve mean gradient measures 3.0 mmHg.  5. The inferior vena cava is normal in size with greater than 50% respiratory variability, suggesting right atrial pressure of 3 mmHg. FINDINGS  Left Ventricle: Left ventricular ejection fraction, by estimation, is 60 to 65%. The left ventricle has normal function. The left ventricle has no regional wall motion abnormalities. The left ventricular internal cavity size was normal in size. There is  mild left ventricular hypertrophy. Left ventricular diastolic parameters were  normal. Right Ventricle: The right ventricular size is normal. No increase in right ventricular wall thickness. Right ventricular systolic function is normal. Tricuspid regurgitation signal is inadequate for assessing PA pressure. Left Atrium: Left atrial size was normal in size. Right Atrium: Right atrial size was normal in size. Pericardium: There is no evidence of pericardial effusion. Presence of pericardial fat pad. Mitral Valve: The mitral valve is grossly normal. Mild mitral annular calcification. Trivial mitral valve regurgitation. MV peak gradient, 2.7 mmHg. The mean mitral valve  gradient is 1.0 mmHg. Tricuspid Valve: The tricuspid valve is grossly normal. Tricuspid valve regurgitation is trivial. Aortic Valve: The aortic valve is tricuspid. There is mild aortic valve annular calcification. Aortic valve regurgitation is not visualized. Mild aortic valve sclerosis is present, with no evidence of aortic valve stenosis. Aortic valve mean gradient measures 3.0 mmHg. Aortic valve peak gradient measures 5.7 mmHg. Aortic valve area, by VTI measures 2.71 cm. Pulmonic Valve: The pulmonic valve was grossly normal. Pulmonic valve regurgitation is trivial. Aorta: The aortic root is normal in size and structure. Venous: The inferior vena cava is normal in size with greater than 50% respiratory variability, suggesting right atrial pressure of 3 mmHg. IAS/Shunts: The interatrial septum appears to be lipomatous. No atrial level shunt detected by color flow Doppler.  LEFT VENTRICLE PLAX 2D LVIDd:         4.38 cm  Diastology LVIDs:         2.91 cm  LV e' medial:    7.10 cm/s LV PW:         1.04 cm  LV E/e' medial:  11.9 LV IVS:        1.06 cm  LV e' lateral:   7.62 cm/s LVOT diam:     2.00 cm  LV E/e' lateral: 11.0 LV SV:         90 LVOT Area:     3.14 cm  RIGHT VENTRICLE RV Basal diam:  3.37 cm RV Mid diam:    3.06 cm RV S prime:     10.20 cm/s TAPSE (M-mode): 2.3 cm LEFT ATRIUM             RIGHT ATRIUM LA diam:        3.00 cm RA Area:     13.30 cm LA Vol (A2C):   49.5 ml RA Volume:   31.30 ml LA Vol (A4C):   28.7 ml LA Biplane Vol: 40.5 ml  AORTIC VALVE AV Area (Vmax):    2.85 cm AV Area (Vmean):   2.55 cm AV Area (VTI):     2.71 cm AV Vmax:           119.00 cm/s AV Vmean:          86.100 cm/s AV VTI:            0.330 m AV Peak Grad:      5.7 mmHg AV Mean Grad:      3.0 mmHg LVOT Vmax:         108.00 cm/s LVOT Vmean:        69.800 cm/s LVOT VTI:          0.285 m LVOT/AV VTI ratio: 0.86  AORTA Ao Root diam: 3.30 cm MITRAL VALVE MV Area (PHT): 3.15 cm    SHUNTS MV Area VTI:   2.54 cm    Systemic VTI:   0.29 m MV Peak grad:  2.7 mmHg    Systemic Diam: 2.00 cm MV Mean grad:  1.0  mmHg MV Vmax:       0.81 m/s MV Vmean:      41.2 cm/s MV Decel Time: 241 msec MV E velocity: 84.20 cm/s MV A velocity: 72.70 cm/s MV E/A ratio:  1.16 Nona Dell MD Electronically signed by Nona Dell MD Signature Date/Time: 09/18/2020/2:03:27 PM    Final     Labs:  CBC: Recent Labs    09/17/20 2200 09/18/20 0351 09/21/20 0339  WBC 11.6* 9.8 7.1  HGB 15.1* 13.8 13.5  HCT 45.0 41.0 40.2  PLT 308 301 269    COAGS: No results for input(s): INR, APTT in the last 8760 hours.  BMP: Recent Labs    09/17/20 2200 09/18/20 0351 09/21/20 0339  NA 140 140 137  K 3.5 3.6 2.9*  CL 103 106 100  CO2 25 25 25   GLUCOSE 120* 149* 110*  BUN 13 15 8   CALCIUM 9.5 9.2 8.6*  CREATININE 0.77 0.66 0.63  GFRNONAA >60 >60 >60    LIVER FUNCTION TESTS: Recent Labs    09/17/20 2200 09/18/20 0351  BILITOT 0.7 0.6  AST 19 16  ALT 13 12  ALKPHOS 77 66  PROT 7.5 6.6  ALBUMIN 4.2 3.7    TUMOR MARKERS: No results for input(s): AFPTM, CEA, CA199, CHROMGRNA in the last 8760 hours.  Assessment and Plan:  CVA Dysphagia Need for long term care Scheduled for percutaneous gastric tube placement in IR at [facility] 7/1 Pt to be at [facility] IR 9 am 7/1 via ambulance Risks and benefits image guided gastrostomy tube placement was discussed with the patient's son [facility] via phone including, but not limited to the need for a barium enema during the procedure, bleeding, infection, peritonitis and/or damage to adjacent structures.  All questions were answered, patient is agreeable to proceed. Consent signed and in chart.    Thank you for this interesting consult.  I greatly enjoyed meeting Nandita Mathenia Deupree and look forward to participating in their care.  A copy of this report was sent to the requesting provider on this date.  Electronically Signed: Donnetta Hail, PA-C 09/21/2020, 12:35 PM   I spent a total of 40  Minutes    in face to face in clinical consultation, greater than 50% of which was counseling/coordinating care for percutaneous gastric tube placement

## 2020-09-21 NOTE — Progress Notes (Addendum)
    Cardiology was consulted for consideration of TEE and ILR placement given her recent CVA. By review of the chart, she has suffered from acute encephalopathy and is unable to follow commands and is at severe risk for aspiration. [facility] has been following and at this time her family has declided upon Full Code status with full scope of treatment.   At the time of my examination, she is opening her eyes but will not follow commands. Non-verbal and is forming bubbles at her mouth. Reviewed with Dr. Diona Browner as well and it is felt she would be high-risk for aspiration with TEE and risks would outweigh benefits. Reviewed with EP and not felt to be a good candidate for an ILR currently. Could place a 30-day monitor and follow her clinical course as an outpatient and if her clinical status does improve, would consider ILR at that time if event monitor is unrevealing.   I called and spoke to her son Donnetta Hail) and reviewed the above plans from a cardiac perspective. Will not pursue a TEE at this time. Will arrange for a 30-day monitor prior to discharge (unknown discharge date at this time as awaiting G-tube placement).   Signed, Ellsworth Lennox, PA-C 09/21/2020, 10:39 AM Pager: [phone]

## 2020-09-21 NOTE — Progress Notes (Signed)
  Speech Language Pathology Treatment: Dysphagia  Patient Details Name: Kendra Lee MRN: [id number] DOB: Apr 18, 1955 Today's Date: 09/21/2020 Time: 6295-2841 SLP Time Calculation (min) (ACUTE ONLY): 27 min  Assessment / Plan / Recommendation Clinical Impression  Pt seen for ongoing dysphagia intervention. Pt continues to be lethargic, but follows commands with cues and delays. Today, she whispers mostly despite cues for voicing. She does NOT have copious oral secretions, but she does have some and are clear and thin. It appears that Pt has NOT received oral care since admission, as she only has oral suction at bedside, the cleanser I left at bedside yesterday has not been opened and she does not have the oral care protocol at bedside. Pt will needs oral care with suction toothbrush and cleanser at least 4x per day. Pt IS able to swallow, however it does require effort and cues to do so. SLP provided oral care this date and Pt with facial grimace at times and indicates that her teeth/gums hurt. Pt assessed with ice chips, thin water, NTL, and puree. Pt only able to take limited amounts due to oral weakness (she requires cues to open mouth and manipulate bolus and experiences labial spillage and oral residuals). Pt appears to be working hard with SLP and attempting to do what is asked, however she is very weak. Pt continues to be at risk for aspiration and will likely need alternative means of nutrition (could try short term small bore feeding tube through nose vs PEG) with SLP to continue efforts and RN to complete oral care and encourage swallowing. Pt will benefit from MBSS when clinically appropriate, however it would not be useful at this stage given her lethargy and limited ability to tolerate po beyond a few bites/sips at this time. SLP will follow.   HPI HPI: 65 y.o. female  with past medical history of no known medical history admitted on 09/17/2020 with acute metabolic encephalopathy, CVA acute  embolic stroke. MRI shows Scattered small vessel infarcts in the bilateral thalamus,   bilateral internal capsule, and bilateral hemispheric white matter.      SLP Plan  Continue with current plan of care (not yet ready for MBS)       Recommendations  Diet recommendations: NPO Medication Administration: Via alternative means                Oral Care Recommendations: Oral care QID;Staff/trained caregiver to provide oral care Follow up Recommendations: Skilled Nursing facility SLP Visit Diagnosis: Dysphagia, unspecified (R13.10) Plan: Continue with current plan of care (not yet ready for MBS)       Thank you,  [name], CCC-SLP [phone]                 [name] 09/21/2020, 11:50 AM

## 2020-09-21 NOTE — Progress Notes (Addendum)
PROGRESS NOTE  Kendra Lee QIH:[id number] DOB: 02/19/1956 DOA: 09/17/2020 PCP: Pcp, No  Brief History:  65 y.o. female, with no known medical history, who presents to the ED with altered mental status. History is quite limited 2/2 patient's acute encephalopathy. Earlier, family was able to provide some history to the ED provider. They reported that the patient's last known normal was 6/24. She has been somnolent, and not taking care of herself in that interval. Family was not aware of an EtOH or illicit drug use. They are not aware of an trauma, and there are not signs of trauma on exam. Family reported no infectious symptoms or fevers.  ED: Vitals stable, encephalopathic, CT head shows focal regions of hypoattenuation seen in the genu of the right internal capsule, posterior limb of the left internal capsule and right thalamus which could reflect age-indeterminate infarction.  No other acute abnormality     Assessment/Plan: Acute ischemic stroke/Acute metabolic Encephalopathy -case discussed with neurology, Dr. Eligah East most likely due to stroke -MR brain--scattered bilateral infarcts of thalamus -carotid US--R-ICA--50-69%; L-ICA <50% -MRA brain--no LVO -Echo (TTE)--EF 60-65%, no WMA, no PFO -A1C--5.5 -LDL--138 -continue ASA and plavix -discussed with cardiology--not a good candidate for TEE nor loop recorder -TSH--1.927 -ammonia 15 -B12--262 -folate 4.8 -UA--no pyuria   Hyperlipidemia -continue statin when safe to take po   Dysphagia -speech eval-->not safe for po intake -IR consult for PEG   Hypertension -allowing permissive hypertension initially -start IV hydralazine  Hypokalemia -replete -mag 2.1               Status is: Inpatient   Remains inpatient appropriate because:Altered mental status   Dispo: The patient is from: Home              Anticipated d/c is to: SNF              Patient currently is not medically stable to  d/c.              Difficult to place patient No     Total time spent 35 minutes.  Greater than 50% spent face to face counseling and coordinating care.             Family Communication:   left VM for son   Consultants:  neurology   Code Status:  FULL   DVT Prophylaxis:  [place] Heparin     Procedures: As Listed in Progress Note Above   Antibiotics: None     Subjective: ROS limited due to encephalopathy.  Denies cp, sob.  No vomiting, diarrhea  Objective: Vitals:   09/21/20 0900 09/21/20 1205 09/21/20 1443 09/21/20 1530  BP: (!) 207/75 (!) 170/63 (!) 199/121 (!) 172/71  Pulse: (!) 49 (!) 52 60 (!) 54  Resp: Temp: 98.2 F (36.8 C)  98.6 F (37 C)   TempSrc: Oral  Oral   SpO2: 94% 96% 96%   Weight:      Height:        Intake/Output Summary (Last 24 hours) at 09/21/2020 1733 Last data filed at 09/21/2020 1530 Gross per 24 hour  Intake 4687.27 ml  Output 350 ml  Net 4337.27 ml   Weight change:  Exam:  General:  Pt is alert, intermittently follows commands appropriately, not in acute distress HEENT: No icterus, No thrush, No neck mass, [place]/AT Cardiovascular: RRR, S1/S2, no rubs, no gallops Respiratory: poor inspiratory effort.  Bibasilar rales Abdomen:  Soft/+BS, non tender, non distended, no guarding Extremities: No edema, No lymphangitis, No petechiae, No rashes, no synovitis   Data Reviewed: I have personally reviewed following labs and imaging studies Basic Metabolic Panel: Recent Labs  Lab 09/17/20 2200 09/18/20 0351 09/21/20 0339  NA 140 140 137  K 3.5 3.6 2.9*  CL 103 106 100  CO2 25 25 25   GLUCOSE 120* 149* 110*  BUN 13 15 8   CREATININE 0.77 0.66 0.63  CALCIUM 9.5 9.2 8.6*  MG  --  2.1  --    Liver Function Tests: Recent Labs  Lab 09/17/20 2200 09/18/20 0351  AST 19 16  ALT 13 12  ALKPHOS 77 66  BILITOT 0.7 0.6  PROT 7.5 6.6  ALBUMIN 4.2 3.7   No results for input(s): LIPASE, AMYLASE in the last 168 hours. Recent  Labs  Lab 09/18/20 0002 09/21/20 0339  AMMONIA 15 26   Coagulation Profile: No results for input(s): INR, PROTIME in the last 168 hours. CBC: Recent Labs  Lab 09/17/20 2200 09/18/20 0351 09/21/20 0339  WBC 11.6* 9.8 7.1  NEUTROABS 8.9*  --   --   HGB 15.1* 13.8 13.5  HCT 45.0 41.0 40.2  MCV 93.8 94.0 95.3  PLT 308 301 269   Cardiac Enzymes: No results for input(s): CKTOTAL, CKMB, CKMBINDEX, TROPONINI in the last 168 hours. BNP: Invalid input(s): POCBNP CBG: No results for input(s): GLUCAP in the last 168 hours. HbA1C: No results for input(s): HGBA1C in the last 72 hours. Urine analysis:    Component Value Date/Time   COLORURINE YELLOW 09/18/2020 0020   APPEARANCEUR CLEAR 09/18/2020 0020   LABSPEC 1.013 09/18/2020 0020   PHURINE 5.0 09/18/2020 0020   GLUCOSEU NEGATIVE 09/18/2020 0020   HGBUR SMALL (A) 09/18/2020 0020   BILIRUBINUR NEGATIVE 09/18/2020 0020   KETONESUR 20 (A) 09/18/2020 0020   PROTEINUR NEGATIVE 09/18/2020 0020   NITRITE NEGATIVE 09/18/2020 0020   LEUKOCYTESUR NEGATIVE 09/18/2020 0020   Sepsis Labs: @LABRCNTIP (procalcitonin:4,lacticidven:4) ) Recent Results (from the past 240 hour(s))  Culture, blood (routine x 2)     Status: None (Preliminary result)   Collection Time: 09/17/20 10:00 PM   Specimen: Right Antecubital; Blood  Result Value Ref Range Status   Specimen Description RIGHT ANTECUBITAL  Final   Special Requests   Final    BOTTLES DRAWN AEROBIC AND ANAEROBIC Blood Culture results may not be optimal due to an excessive volume of blood received in culture bottles   Culture   Final    NO GROWTH 2 DAYS Performed at [facility], [address] [place], [facility] [address]    Report Status PENDING  Incomplete  Resp Panel by RT-PCR (Flu A&B, Covid) Nasopharyngeal Swab     Status: None   Collection Time: 09/17/20 10:35 PM   Specimen: Nasopharyngeal Swab; Nasopharyngeal(NP) swabs in vial transport medium  Result Value Ref Range Status   SARS  Coronavirus 2 by RT PCR NEGATIVE NEGATIVE Final    Comment: (NOTE) SARS-CoV-2 target nucleic acids are NOT DETECTED.  The SARS-CoV-2 RNA is generally detectable in upper respiratory specimens during the acute phase of infection. The lowest concentration of SARS-CoV-2 viral copies this assay can detect is 138 copies/mL. A negative result does not preclude SARS-Cov-2 infection and should not be used as the sole basis for treatment or other patient management decisions. A negative result may occur with  improper specimen collection/handling, submission of specimen other than nasopharyngeal swab, presence of viral mutation(s) within the areas targeted by this  assay, and inadequate number of viral copies(<138 copies/mL). A negative result must be combined with clinical observations, patient history, and epidemiological information. The expected result is Negative.  Fact Sheet for Patients:  BloggerCourse.com  Fact Sheet for Healthcare Providers:  SeriousBroker.it  This test is no t yet approved or cleared by the [place] FDA and  has been authorized for detection and/or diagnosis of SARS-CoV-2 by FDA under an Emergency Use Authorization (EUA). This EUA will remain  in effect (meaning this test can be used) for the duration of the COVID-19 declaration under Section 564(b)(1) of the Act, 21 U.S.C.section 360bbb-3(b)(1), unless the authorization is terminated  or revoked sooner.       Influenza A by PCR NEGATIVE NEGATIVE Final   Influenza B by PCR NEGATIVE NEGATIVE Final    Comment: (NOTE) The Xpert Xpress SARS-CoV-2/FLU/RSV plus assay is intended as an aid in the diagnosis of influenza from Nasopharyngeal swab specimens and should not be used as a sole basis for treatment. Nasal washings and aspirates are unacceptable for Xpert Xpress SARS-CoV-2/FLU/RSV testing.  Fact Sheet for  Patients: BloggerCourse.com  Fact Sheet for Healthcare Providers: SeriousBroker.it  This test is not yet approved or cleared by the [place] FDA and has been authorized for detection and/or diagnosis of SARS-CoV-2 by FDA under an Emergency Use Authorization (EUA). This EUA will remain in effect (meaning this test can be used) for the duration of the COVID-19 declaration under Section 564(b)(1) of the Act, 21 U.S.C. section 360bbb-3(b)(1), unless the authorization is terminated or revoked.  Performed at [facility], [address] [place], [place] [address]   Culture, blood (routine x 2)     Status: None (Preliminary result)   Collection Time: 09/18/20 12:01 AM   Specimen: BLOOD LEFT ARM  Result Value Ref Range Status   Specimen Description BLOOD LEFT ARM  Final   Special Requests   Final    BOTTLES DRAWN AEROBIC AND ANAEROBIC Blood Culture adequate volume   Culture   Final    NO GROWTH 1 DAY Performed at [facility], [address] [place], [place] [address]    Report Status PENDING  Incomplete     Scheduled Meds:  amLODipine  5 mg Oral Daily   aspirin  300 mg Rectal Daily   atorvastatin  80 mg Oral Daily   [START ON 09/23/2020] heparin  5,000 Units Subcutaneous Q8H   mouth rinse  15 mL Mouth Rinse BID   scopolamine  1 patch Transdermal Q72H   thiamine injection  100 mg Intravenous Daily   Continuous Infusions:  [START ON 09/22/2020]  ceFAZolin (ANCEF) IV     dextrose 5 % and 0.9 % NaCl with KCl 20 mEq/L 75 mL/hr at 09/21/20 1200   folic acid (FOLVITE) IVPB 1 mg (09/21/20 1245)    Procedures/Studies: CT ABDOMEN WO CONTRAST  Result Date: 09/21/2020 CLINICAL DATA:  Evaluate anatomy prior to potential percutaneous gastrostomy tube placement. EXAM: CT ABDOMEN WITHOUT CONTRAST TECHNIQUE: Multidetector CT imaging of the abdomen was performed following the standard protocol without IV contrast. COMPARISON:  None. FINDINGS:  The lack of intravenous contrast limits the ability to evaluate solid abdominal organs. Lower chest: Limited visualization of the lower thorax demonstrates minimal dependent subpleural atelectasis. Normal heart size. No pericardial effusion. Hepatobiliary: Normal hepatic contour. Normal noncontrast appearance of the gallbladder given degree distention. No radiopaque gallstones. There is a trace amount of perihepatic ascites. Pancreas: Normal noncontrast appearance of the pancreas. Spleen: Normal noncontrast appearance of the spleen.  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 Normal noncontrast appearance of the right kidney. No renal stones. No right-sided urinary obstruction. Normal noncontrast appearance the bilateral adrenal glands. The urinary bladder was not imaged. Stomach/Bowel: The anterior wall the stomach is well apposed against the ventral wall of the abdomen without interposition of the hepatic parenchyma or the transverse colon. Moderate colonic stool burden without evidence of enteric obstruction. No pneumoperitoneum, pneumatosis or portal venous gas. Vascular/Lymphatic: Large amount of irregular calcified atherosclerotic plaque within a bilobed ectatic abdominal aorta with dominant caudal component measuring 2.6 cm and maximal oblique short axis coronal diameter (coronal image 33, series 5). No bulky retroperitoneal or mesenteric lymphadenopathy. Other: Regional soft tissues appear normal. Musculoskeletal: No acute or aggressive osseous abnormalities. Mild DDD of L4-L5 with disc space height loss, endplate irregularity and sclerosis. IMPRESSION: 1. Gastric anatomy amenable to potential percutaneous gastrostomy tube placement as indicated. 2. 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 3. Mildly ectatic abdominal aorta measuring 2.6 cm in maximal  diameter. Recommend follow-up aortic ultrasound in 5 years. This recommendation follows ACR consensus guidelines: White Paper of the ACR Incidental Findings Committee II on Vascular Findings. J Am Coll Radiol 2013; 10:789-794. 4. Aortic aneurysm NOS (ICD10-I71.9). Electronically Signed   By: Simonne Come M.D.   On: 09/21/2020 09:36   CT Head Wo Contrast  Result Date: 09/17/2020 CLINICAL DATA:  Mental status change, found this afternoon on couch EXAM: CT HEAD WITHOUT CONTRAST TECHNIQUE: Contiguous axial images were obtained from the base of the skull through the vertex without intravenous contrast. COMPARISON:  None. FINDINGS: Brain: Foci of hypoattenuation present in the right internal capsule as as a separate focus in the right caudate and posterior limb of the left internal capsule could reflect sequela of age-indeterminate lacunar type infarcts difficult to fully assess given a background of more diffuse patchy white matter hypoattenuation typically indicative of microvascular angiopathy in a patient of this age. Additional hypoattenuation in the pons and brainstem is more nonspecific given streak artifact across skull base. Symmetric prominence of the ventricles, cisterns and sulci compatible with parenchymal volume loss. No hyperdense hemorrhage. No mass effect or midline shift. No extra-axial collection. Scattered benign dural calcifications. Vascular: Atherosclerotic calcification of the carotid siphons. No hyperdense vessel. Skull: No calvarial fracture or suspicious osseous lesion. No scalp swelling or hematoma. Sinuses/Orbits: Paranasal sinuses and mastoid air cells are predominantly clear. Other: Included orbital structures are unremarkable. 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 Could be further characterized with MR imaging as warranted. No other acute intracranial abnormality. These results were called by telephone at the time of interpretation on 09/17/2020 at 11:45 pm to provider Benjiman Core , who verbally acknowledged these results. Electronically Signed   By: Kreg Shropshire M.D.   On: 09/17/2020 23:45   MR ANGIO HEAD WO CONTRAST  Result Date: 09/18/2020 CLINICAL DATA:  Altered mental status. EXAM: MRI HEAD WITHOUT CONTRAST MRA HEAD WITHOUT CONTRAST TECHNIQUE: Multiplanar, multi-echo pulse sequences of the brain and surrounding structures were acquired without intravenous contrast. Angiographic images of the Circle of Willis were acquired using MRA technique without intravenous contrast. COMPARISON:  No pertinent prior exam. FINDINGS: MRI HEAD FINDINGS Brain: Restricted diffusion  in the bilateral thalamus, bilateral internal capsule, left centrum semiovale, and right subcortical frontal. These have the appearance of acute infarcts. Mild small vessel ischemic type change in the pons and hemispheric white matter. No evidence of hemorrhage, hydrocephalus, or collection. Vascular: Preserved flow voids. Skull and upper cervical spine: Normal marrow signal Sinuses/Orbits: Negative Other: Significant and progressive motion artifact MRA HEAD FINDINGS Very motion degraded. Carotid, vertebral, and basilar arteries and major branches are patent with gross symmetry. IMPRESSION: Brain MRI: 1. 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 These are not in a unified arterial or venous distribution and are of uncertain cause. No revealing background chronic brain findings. 2. Significantly motion degraded Intracranial MRA: Very motion degraded and of limited utility other than documenting patency of major vessels. Electronically Signed   By: Marnee SpringJonathon  Watts M.D.    On: 09/18/2020 08:43   MR BRAIN WO CONTRAST  Result Date: 09/18/2020 CLINICAL DATA:  Altered mental status. EXAM: MRI HEAD WITHOUT CONTRAST MRA HEAD WITHOUT CONTRAST TECHNIQUE: Multiplanar, multi-echo pulse sequences of the brain and surrounding structures were acquired without intravenous contrast. Angiographic images of the Circle of Willis were acquired using MRA technique without intravenous contrast. COMPARISON:  No pertinent prior exam. FINDINGS: MRI HEAD FINDINGS Brain: Restricted diffusion in the bilateral thalamus, bilateral internal capsule, left centrum semiovale, and right subcortical frontal. These have the appearance of acute infarcts. Mild small vessel ischemic type change in the pons and hemispheric white matter. No evidence of hemorrhage, hydrocephalus, or collection. Vascular: Preserved flow voids. Skull and upper cervical spine: Normal marrow signal Sinuses/Orbits: Negative Other: Significant and progressive motion artifact MRA HEAD FINDINGS Very motion degraded. Carotid, vertebral, and basilar arteries and major branches are patent with gross symmetry. IMPRESSION: Brain MRI: 1. 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 These are not in a unified arterial or venous distribution and are of uncertain cause. No revealing background chronic brain findings. 2. Significantly motion degraded Intracranial MRA: Very motion degraded and of limited utility other than documenting patency of major vessels. Electronically Signed   By: Marnee SpringJonathon  Watts M.D.   On: 09/18/2020 08:43   US Carotid Bilateral (at [facility] and [facility] only)  Result Date: 09/18/2020 CLINICAL DATA:  Altered mental status Hypertension Stroke EXAM: BILATERAL CAROTID DUPLEX ULTRASOUND TECHNIQUE: Wallace CullensGray scale imaging, color Doppler and duplex ultrasound were performed of bilateral carotid and vertebral arteries in the neck. COMPARISON:  None. FINDINGS: Criteria:  Quantification of carotid stenosis is based on velocity parameters that correlate the residual internal carotid diameter with NASCET-based stenosis levels, using the diameter of the distal internal carotid lumen as the denominator for stenosis measurement. The following velocity measurements were obtained: RIGHT ICA: 156/29 cm/sec CCA: 77/11 cm/sec SYSTOLIC ICA/CCA RATIO:  2.0 ECA: 173 cm/sec LEFT ICA: 83/18 cm/sec CCA: 85/14 cm/sec SYSTOLIC ICA/CCA RATIO:  1.0 ECA: 92 cm/sec RIGHT CAROTID ARTERY: Heterogeneous calcified and noncalcified plaque of the distal common and proximal internal carotid arteries with velocity indicative of 50-69% stenosis. RIGHT VERTEBRAL ARTERY:  Antegrade flow. LEFT CAROTID ARTERY: Heterogeneous calcified and noncalcified plaque of the distal common and proximal internal carotid arteries with velocity parameters indicative of less than 50% stenosis. LEFT VERTEBRAL ARTERY:  Antegrade flow. IMPRESSION: 1. 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 2. Less than 50% stenosis of the left internal carotid artery. 3. Long segment shadowing plaque present in both common and internal carotid arteries, which  can obscure higher velocities. Further evaluation with MRI or CT of the neck should be performed for more precise evaluation of degree of stenosis. Electronically Signed   By: Acquanetta Belling M.D.   On: 09/18/2020 10:01   DG Chest Portable 1 View  Result Date: 09/17/2020 CLINICAL DATA:  Altered mental status EXAM: PORTABLE CHEST 1 VIEW COMPARISON:  None FINDINGS: There is hyperinflation of the lungs compatible with COPD. Heart and mediastinal contours are within normal limits. No focal opacities or effusions. No acute bony abnormality. IMPRESSION: COPD.  No active disease. Electronically Signed   By: Charlett Nose M.D.   On: 09/17/2020 23:02   ECHOCARDIOGRAM COMPLETE  Result Date: 09/18/2020    ECHOCARDIOGRAM REPORT   Patient Name:   Kendra Lee, Kendra Lee Date of Exam: 09/18/2020 Medical  Rec #:  [id number]       Height: Accession #:    [id number]      Weight: Date of Birth:  1955/12/14       BSA: Patient Age:    64 years        BP:           213/100 mmHg Patient Gender: F               HR:           59 bpm. [facility] Procedure: 2D Echo, Cardiac Doppler and Color Doppler Indications:    Stroke  History:        Patient has no prior history of Echocardiogram examinations.                 Stroke; Risk Factors:Hypertension. Height and Weight not                 available.  Sonographer:    Mikki Harbor Referring Phys: [id number] ASIA B ZIERLE-GHOSH  Sonographer Comments: Height and Weight not available IMPRESSIONS  1. Left ventricular ejection fraction, by estimation, is 60 to 65%. The left ventricle has normal function. The left ventricle has no regional wall motion abnormalities. There is mild left ventricular hypertrophy. Left ventricular diastolic parameters were normal.  2. Right ventricular systolic function is normal. The right ventricular size is normal. Tricuspid regurgitation signal is inadequate for assessing PA pressure.  3. The mitral valve is grossly normal. Trivial mitral valve regurgitation.  4. The aortic valve is tricuspid. Aortic valve regurgitation is not visualized. Mild aortic valve sclerosis is present, with no evidence of aortic valve stenosis. Aortic valve mean gradient measures 3.0 mmHg.  5. The inferior vena cava is normal in size with greater than 50% respiratory variability, suggesting right atrial pressure of 3 mmHg. FINDINGS  Left Ventricle: Left ventricular ejection fraction, by estimation, is 60 to 65%. The left ventricle has normal function. The left ventricle has no regional wall motion abnormalities. The left ventricular internal cavity size was normal in size. There is  mild left ventricular hypertrophy. Left ventricular diastolic parameters were normal. Right Ventricle: The right ventricular size is normal. No increase in right ventricular wall  thickness. Right ventricular systolic function is normal. Tricuspid regurgitation signal is inadequate for assessing PA pressure. Left Atrium: Left atrial size was normal in size. Right Atrium: Right atrial size was normal in size. Pericardium: There is no evidence of pericardial effusion. Presence of pericardial fat pad. Mitral Valve: The mitral valve is grossly normal. Mild mitral annular calcification. Trivial mitral valve regurgitation. MV peak gradient, 2.7 mmHg. The mean mitral valve gradient is 1.0 mmHg.  Tricuspid Valve: The tricuspid valve is grossly normal. Tricuspid valve regurgitation is trivial. Aortic Valve: The aortic valve is tricuspid. There is mild aortic valve annular calcification. Aortic valve regurgitation is not visualized. Mild aortic valve sclerosis is present, with no evidence of aortic valve stenosis. Aortic valve mean gradient measures 3.0 mmHg. Aortic valve peak gradient measures 5.7 mmHg. Aortic valve area, by VTI measures 2.71 cm. Pulmonic Valve: The pulmonic valve was grossly normal. Pulmonic valve regurgitation is trivial. Aorta: The aortic root is normal in size and structure. Venous: The inferior vena cava is normal in size with greater than 50% respiratory variability, suggesting right atrial pressure of 3 mmHg. IAS/Shunts: The interatrial septum appears to be lipomatous. No atrial level shunt detected by color flow Doppler.  LEFT VENTRICLE PLAX 2D LVIDd:         4.38 cm  Diastology LVIDs:         2.91 cm  LV e' medial:    7.10 cm/s LV PW:         1.04 cm  LV E/e' medial:  11.9 LV IVS:        1.06 cm  LV e' lateral:   7.62 cm/s LVOT diam:     2.00 cm  LV E/e' lateral: 11.0 LV SV:         90 LVOT Area:     3.14 cm  RIGHT VENTRICLE RV Basal diam:  3.37 cm RV Mid diam:    3.06 cm RV S prime:     10.20 cm/s TAPSE (M-mode): 2.3 cm LEFT ATRIUM             RIGHT ATRIUM LA diam:        3.00 cm RA Area:     13.30 cm LA Vol (A2C):   49.5 ml RA Volume:   31.30 ml LA Vol (A4C):   28.7 ml LA  Biplane Vol: 40.5 ml  AORTIC VALVE AV Area (Vmax):    2.85 cm AV Area (Vmean):   2.55 cm AV Area (VTI):     2.71 cm AV Vmax:           119.00 cm/s AV Vmean:          86.100 cm/s AV VTI:            0.330 m AV Peak Grad:      5.7 mmHg AV Mean Grad:      3.0 mmHg LVOT Vmax:         108.00 cm/s LVOT Vmean:        69.800 cm/s LVOT VTI:          0.285 m LVOT/AV VTI ratio: 0.86  AORTA Ao Root diam: 3.30 cm MITRAL VALVE MV Area (PHT): 3.15 cm    SHUNTS MV Area VTI:   2.54 cm    Systemic VTI:  0.29 m MV Peak grad:  2.7 mmHg    Systemic Diam: 2.00 cm MV Mean grad:  1.0 mmHg MV Vmax:       0.81 m/s MV Vmean:      41.2 cm/s MV Decel Time: 241 msec MV E velocity: 84.20 cm/s MV A velocity: 72.70 cm/s MV E/A ratio:  1.16 Nona Dell MD Electronically signed by Nona Dell MD Signature Date/Time: 09/18/2020/2:03:27 PM    Final     Catarina Hartshorn, DO  [facility]  If 7PM-7AM, please contact night-coverage [email or web address] Password [email or web address] 09/21/2020, 5:33 PM   LOS: 3 days

## 2020-09-21 NOTE — Progress Notes (Signed)
Initial Nutrition Assessment  DOCUMENTATION CODES:   Underweight  INTERVENTION:  If patient unable to safely advance diet- and PEG is placed:  -Osmolite 1.2  @ 40 ml/hr via NGT vs PEG and increase by 10 ml every 6 hours to goal rate of 55 ml/hr.   Tube feeding regimen provides 1584 kcal (100% of needs), 73 grams of protein, and 1082 ml of H2O.    Free water flushes if no IVF. Add 200 ml -TID per tube.  NUTRITION DIAGNOSIS:   Inadequate oral intake related to inability to eat (recent stroke) as evidenced by NPO status.   GOAL:  Patient will meet greater than or equal to 90% of their needs   MONITOR:  Labs, Weight trends, Skin (plan of care regarding nutrition support)  REASON FOR ASSESSMENT:   Consult Poor PO (new stroke- requiring NPO)  ASSESSMENT: Patient is an underweight 65 yo female with acute encephalopathy, acute ischemic stroke.   6/29- Palliative Care addressed GOC with son.   ST evaluation today reviewed. Dysphagia-NPO for now. Risk for aspiration PNA. No family at bedside during RD visit. Talked with her nurse. If tube feeding becomes an option. Recommendations provided above. If expected to be short-term 6 weeks or less consider NGT placement.   Medications reviewed and include: B-1, Lipitor.  IVF- D5/NS@75  ml/hr  Folic acid   Labs: BMP Latest Ref Rng & Units 09/21/2020 09/18/2020 09/17/2020  Glucose 70 - 99 mg/dL 784(O) 962(X) 528(U)  BUN 8 - 23 mg/dL 8 15 13   Creatinine 0.44 - 1.00 mg/dL 1.32 4.40  Sodium 135 - 145 mmol/L 137 140 140  Potassium 3.5 - 5.1 mmol/L 2.9(L) 3.6 3.5  Chloride 98 - 111 mmol/L 100 106 103  CO2 22 - 32 mmol/L 25 25 25   Calcium 8.9 - 10.3 mg/dL 1.02) 9.2 9.5     NUTRITION - FOCUSED PHYSICAL EXAM: Unable to complete Nutrition-Focused physical exam at this time.    Diet Order:   Diet Order             Diet NPO time specified Except for: Sips with Meds  Diet effective midnight           Diet NPO time specified  Diet  effective now                   EDUCATION NEEDS:   Not appropriate for education at this time  Skin:  Skin Assessment: Reviewed RN Assessment  Last BM:  6/30 type 6  Height:   Ht Readings from Last 1 Encounters:  09/18/20 5\' 1"  (1.549 m)    Weight:   Wt Readings from Last 1 Encounters:  09/18/20 42.1 kg    Ideal Body Weight:   48 kg  BMI:  Body mass index is 17.54 kg/m.  Estimated Nutritional Needs:   Kcal:  09/20/20  Protein:  65-72 gr  Fluid:  >1200 ml daily   MS,RD,CSG,LDN Contact: 09/20/20

## 2020-09-21 NOTE — TOC Progression Note (Signed)
Transition of Care [facility]) - Progression Note    Patient Details  Name: Kendra Lee MRN: [id number] Date of Birth: 1955/08/31  Transition of Care [facility]) CM/SW Contact  Leitha Bleak, RN Phone Number: 09/21/2020, 11:07 AM  Clinical Narrative:   Ramon Dredge provided SS# [id number], PASSR # [id number],  Sent [facility] out for bed offers. TOC to follow. Planning for a weekend discharge.   Expected Discharge Plan: Skilled Nursing Facility Barriers to Discharge: Continued Medical Work up  Expected Discharge Plan and Services Expected Discharge Plan: Skilled Nursing Facility      Living arrangements for the past 2 months: Single Family Home                   Readmission Risk Interventions Readmission Risk Prevention Plan 09/20/2020  Medication Screening Complete  Transportation Screening Complete

## 2020-09-21 NOTE — TOC Progression Note (Signed)
Transition of Care [facility]) - Progression Note    Patient Details  Name: Jaspreet Bodner MRN: [id number] Date of Birth: [birth year]  Transition of Care [facility]) CM/SW Contact  Leitha Bleak, RN Phone Number: 09/21/2020, 2:10 PM  Clinical Narrative:   Ramon Dredge, son is accepting the bed offer at [facility]. TOC called Debbie to start INS AUTH, patient is not managed by [facility].  Patient will go to IR tomorrow for peg tube placement, discharge planning for Saturday.   Expected Discharge Plan: Skilled Nursing Facility Barriers to Discharge: Continued Medical Work up  Expected Discharge Plan and Services Expected Discharge Plan: Skilled Nursing Facility       Living arrangements for the past 2 months: Single Family Home                                       Social Determinants of Health (SDOH) Interventions    Readmission Risk Interventions Readmission Risk Prevention Plan 09/20/2020  Medication Screening Complete  Transportation Screening Complete

## 2020-09-22 ENCOUNTER — Ambulatory Visit (HOSPITAL_COMMUNITY): Payer: Medicare Other

## 2020-09-22 DIAGNOSIS — I69991 Dysphagia following unspecified cerebrovascular disease: Secondary | ICD-10-CM | POA: Insufficient documentation

## 2020-09-22 DIAGNOSIS — G9341 Metabolic encephalopathy: Secondary | ICD-10-CM | POA: Diagnosis not present

## 2020-09-22 DIAGNOSIS — I10 Essential (primary) hypertension: Secondary | ICD-10-CM | POA: Diagnosis not present

## 2020-09-22 DIAGNOSIS — I639 Cerebral infarction, unspecified: Secondary | ICD-10-CM | POA: Diagnosis not present

## 2020-09-22 HISTORY — PX: IR GASTROSTOMY TUBE MOD SED: IMG625

## 2020-09-22 LAB — PROTIME-INR
INR: 1 (ref 0.8–1.2)
Prothrombin Time: 13.6 seconds (ref 11.4–15.2)

## 2020-09-22 LAB — CULTURE, BLOOD (ROUTINE X 2): Culture: NO GROWTH

## 2020-09-22 MED ORDER — MIDAZOLAM HCL 2 MG/2ML IJ SOLN
INTRAMUSCULAR | Status: AC
  Filled 2020-09-22: qty 2

## 2020-09-22 MED ORDER — CEFAZOLIN SODIUM-DEXTROSE 2-4 GM/100ML-% IV SOLN
INTRAVENOUS | Status: AC
  Filled 2020-09-22: qty 100

## 2020-09-22 MED ORDER — LIDOCAINE HCL 1 % IJ SOLN
INTRAMUSCULAR | Status: AC
  Filled 2020-09-22: qty 20

## 2020-09-22 MED ORDER — FENTANYL CITRATE (PF) 100 MCG/2ML IJ SOLN
INTRAMUSCULAR | Status: AC
  Filled 2020-09-22: qty 2

## 2020-09-22 MED ORDER — MIDAZOLAM HCL 2 MG/2ML IJ SOLN
INTRAMUSCULAR | Status: AC | PRN
  Administered 2020-09-22 (×2): 0.5 mg via INTRAVENOUS
  Administered 2020-09-22: 1 mg via INTRAVENOUS

## 2020-09-22 MED ORDER — IOHEXOL 300 MG/ML  SOLN
50.0000 mL | Freq: Once | INTRAMUSCULAR | Status: AC | PRN
  Administered 2020-09-22: 20 mL

## 2020-09-22 MED ORDER — GLUCAGON HCL RDNA (DIAGNOSTIC) 1 MG IJ SOLR
INTRAMUSCULAR | Status: AC | PRN
  Administered 2020-09-22: .5 mg via INTRAVENOUS

## 2020-09-22 MED ORDER — FENTANYL CITRATE (PF) 100 MCG/2ML IJ SOLN
INTRAMUSCULAR | Status: AC | PRN
  Administered 2020-09-22 (×2): 25 ug via INTRAVENOUS
  Administered 2020-09-22: 50 ug via INTRAVENOUS

## 2020-09-22 MED ORDER — HYDRALAZINE HCL 20 MG/ML IJ SOLN
INTRAMUSCULAR | Status: AC
  Filled 2020-09-22: qty 1

## 2020-09-22 MED ORDER — LIDOCAINE HCL (PF) 1 % IJ SOLN
INTRAMUSCULAR | Status: AC | PRN
  Administered 2020-09-22: 30 mL

## 2020-09-22 MED ORDER — GLUCAGON HCL RDNA (DIAGNOSTIC) 1 MG IJ SOLR
INTRAMUSCULAR | Status: AC
  Filled 2020-09-22: qty 1

## 2020-09-22 NOTE — Progress Notes (Signed)
Called [facility] and scheduled transport to IR for peg tube placement at 9am, transport will pick up at 8am. Informed nurse.

## 2020-09-22 NOTE — Progress Notes (Signed)
Palliative: Mrs. Quintin has transferred to IR at [facility] for PEG tube placement.  She is not present in the room as I visit.  She is anticipated to discharge to [facility] for short-term rehab on 7/2.    Conference with attending, bedside nursing staff, transition of care team related to patient condition, needs, goals of care, disposition.  Plan: PEG tube placement.  Short-term rehab, possible need for long-term care.  Outpatient palliative services to follow.  No charge Lillia Carmel, NP Palliative medicine team Team phone [phone] Greater than 50% of this time was spent counseling and coordinating care related to the above assessment and plan.

## 2020-09-22 NOTE — Progress Notes (Signed)
PROGRESS NOTE  Adam Demary Dains ZOX:[id number] DOB: [birth year] DOA: 09/17/2020 PCP: Pcp, No Brief History:  65 y.o. female, with no known medical history, who presents to the ED with altered mental status. History is quite limited 2/2 patient's acute encephalopathy. Earlier, family was able to provide some history to the ED provider. They reported that the patient's last known normal was 6/24. She has been somnolent, and not taking care of herself in that interval. Family was not aware of an EtOH or illicit drug use. They are not aware of an trauma, and there are not signs of trauma on exam. Family reported no infectious symptoms or fevers.  Work up revealed patient suffered bilateral thalamic infarcts that have contributed to her encephalopathy.  After a goals of care discussion, patient's son wanted full scope of care.  Patient failed swallow eval and PEG tube was placed 09/22/20  ED: Vitals stable, encephalopathic, CT head shows focal regions of hypoattenuation seen in the genu of the right internal capsule, posterior limb of the left internal capsule and right thalamus which could reflect age-indeterminate infarction.  No other acute abnormality     Assessment/Plan: Acute ischemic stroke/Acute metabolic Encephalopathy -case discussed with neurology, Dr. Eligah East most likely due to stroke -MR brain--scattered bilateral infarcts of thalamus -carotid US--R-ICA--50-69%; L-ICA <50% -MRA brain--no LVO -Echo (TTE)--EF 60-65%, no WMA, no PFO -A1C--5.5 -LDL--138 -continue ASA and plavix -discussed with cardiology--not a good candidate for TEE nor loop recorder -TSH--1.927 -ammonia 15 -B12--262 -folate 4.8 -UA--no pyuria -09/22/20--more awake, occasionally following commands   Hyperlipidemia -continue statin when safe to use PEG   Dysphagia -speech eval-->not safe for po intake -IR placed PEG 09/22/20 -start enteral feeding 7/2   Hypertension -allowing permissive  hypertension initially -start IV hydralazine -start amlodipine when able to use PEG   Hypokalemia -replete -mag 2.1               Status is: Inpatient   Remains inpatient appropriate because:Altered mental status   Dispo: The patient is from: Home              Anticipated d/c is to: SNF              Patient currently is not medically stable to d/c.              Difficult to place patient No     Total time spent 35 minutes.  Greater than 50% spent face to face counseling and coordinating care.             Family Communication:   left VM for son   Consultants:  neurology, IR   Code Status:  FULL   DVT Prophylaxis:  [place] Heparin     Procedures: As Listed in Progress Note Above   Antibiotics: None    Subjective: Patient is awake.  Denies cp, sob.  Complains of abd wall pain at PEG site.  Remainder unobtainable.  No vomiting, diarrhea, resp distress  Objective: Vitals:   09/22/20 1120 09/22/20 1135 09/22/20 1245 09/22/20 1641  BP: (!) 178/69 (!) 156/72 (!) 195/105 (!) 173/77  Pulse: 60 (!) 56 64 83  Resp: Temp:    99.3 F (37.4 C)  TempSrc:    Oral  SpO2: 98% 96% 100% 100%  Weight:      Height:        Intake/Output Summary (Last 24 hours) at 09/22/2020 1648 Last data filed  at 09/22/2020 0500 Gross per 24 hour  Intake --  Output 500 ml  Net -500 ml   Weight change:  Exam:  General:  Pt is alert, occasionally follows commands appropriately, not in acute distress HEENT: No icterus, No thrush, No neck mass, [place]/AT Cardiovascular: RRR, S1/S2, no rubs, no gallops Respiratory: bibasilar rales. No wheeze Abdomen: Soft/+BS, non tender, non distended, no guarding Extremities: No edema, No lymphangitis, No petechiae, No rashes, no synovitis   Data Reviewed: I have personally reviewed following labs and imaging studies Basic Metabolic Panel: Recent Labs  Lab 09/17/20 2200 09/18/20 0351 09/21/20 0339  NA 140 140 137  K 3.5 3.6 2.9*  CL  103 106 100  CO2 25 25 25   GLUCOSE 120* 149* 110*  BUN 13 15 8   CREATININE 0.77 0.66 0.63  CALCIUM 9.5 9.2 8.6*  MG  --  2.1  --    Liver Function Tests: Recent Labs  Lab 09/17/20 2200 09/18/20 0351  AST 19 16  ALT 13 12  ALKPHOS 77 66  BILITOT 0.7 0.6  PROT 7.5 6.6  ALBUMIN 4.2 3.7   No results for input(s): LIPASE, AMYLASE in the last 168 hours. Recent Labs  Lab 09/18/20 0002 09/21/20 0339  AMMONIA 15 26   Coagulation Profile: Recent Labs  Lab 09/22/20 0455  INR 1.0   CBC: Recent Labs  Lab 09/17/20 2200 09/18/20 0351 09/21/20 0339  WBC 11.6* 9.8 7.1  NEUTROABS 8.9*  --   --   HGB 15.1* 13.8 13.5  HCT 45.0 41.0 40.2  MCV 93.8 94.0 95.3  PLT 308 301 269   Cardiac Enzymes: No results for input(s): CKTOTAL, CKMB, CKMBINDEX, TROPONINI in the last 168 hours. BNP: Invalid input(s): POCBNP CBG: No results for input(s): GLUCAP in the last 168 hours. HbA1C: No results for input(s): HGBA1C in the last 72 hours. Urine analysis:    Component Value Date/Time   COLORURINE YELLOW 09/18/2020 0020   APPEARANCEUR CLEAR 09/18/2020 0020   LABSPEC 1.013 09/18/2020 0020   PHURINE 5.0 09/18/2020 0020   GLUCOSEU NEGATIVE 09/18/2020 0020   HGBUR SMALL (A) 09/18/2020 0020   BILIRUBINUR NEGATIVE 09/18/2020 0020   KETONESUR 20 (A) 09/18/2020 0020   PROTEINUR NEGATIVE 09/18/2020 0020   NITRITE NEGATIVE 09/18/2020 0020   LEUKOCYTESUR NEGATIVE 09/18/2020 0020   Sepsis Labs: @LABRCNTIP (procalcitonin:4,lacticidven:4) ) Recent Results (from the past 240 hour(s))  Culture, blood (routine x 2)     Status: None   Collection Time: 09/17/20 10:00 PM   Specimen: Right Antecubital; Blood  Result Value Ref Range Status   Specimen Description RIGHT ANTECUBITAL  Final   Special Requests   Final    BOTTLES DRAWN AEROBIC AND ANAEROBIC Blood Culture results may not be optimal due to an excessive volume of blood received in culture bottles   Culture   Final    NO GROWTH 5  DAYS Performed at [facility], [address] [place], [place] [address]    Report Status 09/22/2020 FINAL  Final  Resp Panel by RT-PCR (Flu A&B, Covid) Nasopharyngeal Swab     Status: None   Collection Time: 09/17/20 10:35 PM   Specimen: Nasopharyngeal Swab; Nasopharyngeal(NP) swabs in vial transport medium  Result Value Ref Range Status   SARS Coronavirus 2 by RT PCR NEGATIVE NEGATIVE Final    Comment: (NOTE) SARS-CoV-2 target nucleic acids are NOT DETECTED.  The SARS-CoV-2 RNA is generally detectable in upper respiratory specimens during the acute phase of infection. The lowest concentration of SARS-CoV-2 viral  copies this assay can detect is 138 copies/mL. A negative result does not preclude SARS-Cov-2 infection and should not be used as the sole basis for treatment or other patient management decisions. A negative result may occur with  improper specimen collection/handling, submission of specimen other than nasopharyngeal swab, presence of viral mutation(s) within the areas targeted by this assay, and inadequate number of viral copies(<138 copies/mL). A negative result must be combined with clinical observations, patient history, and epidemiological information. The expected result is Negative.  Fact Sheet for Patients:  BloggerCourse.com  Fact Sheet for Healthcare Providers:  SeriousBroker.it  This test is no t yet approved or cleared by the [place] FDA and  has been authorized for detection and/or diagnosis of SARS-CoV-2 by FDA under an Emergency Use Authorization (EUA). This EUA will remain  in effect (meaning this test can be used) for the duration of the COVID-19 declaration under Section 564(b)(1) of the Act, 21 U.S.C.section 360bbb-3(b)(1), unless the authorization is terminated  or revoked sooner.       Influenza A by PCR NEGATIVE NEGATIVE Final   Influenza B by PCR NEGATIVE NEGATIVE Final    Comment:  (NOTE) The Xpert Xpress SARS-CoV-2/FLU/RSV plus assay is intended as an aid in the diagnosis of influenza from Nasopharyngeal swab specimens and should not be used as a sole basis for treatment. Nasal washings and aspirates are unacceptable for Xpert Xpress SARS-CoV-2/FLU/RSV testing.  Fact Sheet for Patients: BloggerCourse.com  Fact Sheet for Healthcare Providers: SeriousBroker.it  This test is not yet approved or cleared by the [place] FDA and has been authorized for detection and/or diagnosis of SARS-CoV-2 by FDA under an Emergency Use Authorization (EUA). This EUA will remain in effect (meaning this test can be used) for the duration of the COVID-19 declaration under Section 564(b)(1) of the Act, 21 U.S.C. section 360bbb-3(b)(1), unless the authorization is terminated or revoked.  Performed at [facility], [address], [place], [place] [address]   Culture, blood (routine x 2)     Status: None (Preliminary result)   Collection Time: 09/18/20 12:01 AM   Specimen: BLOOD LEFT ARM  Result Value Ref Range Status   Specimen Description BLOOD LEFT ARM  Final   Special Requests   Final    BOTTLES DRAWN AEROBIC AND ANAEROBIC Blood Culture adequate volume   Culture   Final    NO GROWTH 4 DAYS Performed at [facility], [address], [place], [place] [address]    Report Status PENDING  Incomplete     Scheduled Meds:  amLODipine  5 mg Oral Daily   aspirin  300 mg Rectal Daily   atorvastatin  80 mg Oral Daily   fentaNYL       glucagon (human recombinant)       [START ON 09/23/2020] heparin  5,000 Units Subcutaneous Q8H   lidocaine       mouth rinse  15 mL Mouth Rinse BID   midazolam       scopolamine  1 patch Transdermal Q72H   thiamine injection  100 mg Intravenous Daily   Continuous Infusions:  ceFAZolin     dextrose 5 % and 0.9 % NaCl with KCl 20 mEq/L 75 mL/hr at 09/22/20 1625   folic acid (FOLVITE) IVPB 1 mg  (09/22/20 1307)    Procedures/Studies: CT ABDOMEN WO CONTRAST  Result Date: 09/21/2020 CLINICAL DATA:  Evaluate anatomy prior to potential percutaneous gastrostomy tube placement. EXAM: CT ABDOMEN WITHOUT CONTRAST TECHNIQUE: Multidetector CT imaging of the abdomen  was performed following the standard protocol without IV contrast. COMPARISON:  None. FINDINGS: The lack of intravenous contrast limits the ability to evaluate solid abdominal organs. Lower chest: Limited visualization of the lower thorax demonstrates minimal dependent subpleural atelectasis. Normal heart size. No pericardial effusion. Hepatobiliary: Normal hepatic contour. Normal noncontrast appearance of the gallbladder given degree distention. No radiopaque gallstones. There is a trace amount of perihepatic ascites. Pancreas: Normal noncontrast appearance of the pancreas. Spleen: Normal noncontrast appearance of the spleen. 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 Normal noncontrast appearance of the right kidney. No renal stones. No right-sided urinary obstruction. Normal noncontrast appearance the bilateral adrenal glands. The urinary bladder was not imaged. Stomach/Bowel: The anterior wall the stomach is well apposed against the ventral wall of the abdomen without interposition of the hepatic parenchyma or the transverse colon. Moderate colonic stool burden without evidence of enteric obstruction. No pneumoperitoneum, pneumatosis or portal venous gas. Vascular/Lymphatic: Large amount of irregular calcified atherosclerotic plaque within a bilobed ectatic abdominal aorta with dominant caudal component measuring 2.6 cm and maximal oblique short axis coronal diameter (coronal image 33, series 5). No bulky retroperitoneal or mesenteric lymphadenopathy. Other: Regional soft tissues appear normal. Musculoskeletal: No acute or aggressive osseous  abnormalities. Mild DDD of L4-L5 with disc space height loss, endplate irregularity and sclerosis. IMPRESSION: 1. Gastric anatomy amenable to potential percutaneous gastrostomy tube placement as indicated. 2. 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 3. Mildly ectatic abdominal aorta measuring 2.6 cm in maximal diameter. Recommend follow-up aortic ultrasound in 5 years. This recommendation follows ACR consensus guidelines: White Paper of the ACR Incidental Findings Committee II on Vascular Findings. J Am Coll Radiol 2013; 10:789-794. 4. Aortic aneurysm NOS (ICD10-I71.9). Electronically Signed   By: Simonne Come M.D.   On: 09/21/2020 09:36   CT Head Wo Contrast  Result Date: 09/17/2020 CLINICAL DATA:  Mental status change, found this afternoon on couch EXAM: CT HEAD WITHOUT CONTRAST TECHNIQUE: Contiguous axial images were obtained from the base of the skull through the vertex without intravenous contrast. COMPARISON:  None. FINDINGS: Brain: Foci of hypoattenuation present in the right internal capsule as as a separate focus in the right caudate and posterior limb of the left internal capsule could reflect sequela of age-indeterminate lacunar type infarcts difficult to fully assess given a background of more diffuse patchy white matter hypoattenuation typically indicative of microvascular angiopathy in a patient of this age. Additional hypoattenuation in the pons and brainstem is more nonspecific given streak artifact across skull base. Symmetric prominence of the ventricles, cisterns and sulci compatible with parenchymal volume loss. No hyperdense hemorrhage. No mass effect or midline shift. No extra-axial collection. Scattered benign dural calcifications. Vascular: Atherosclerotic calcification of the carotid siphons. No hyperdense vessel. Skull: No calvarial fracture or suspicious osseous lesion. No scalp swelling or hematoma. Sinuses/Orbits:  Paranasal sinuses and mastoid air cells are predominantly clear. Other: Included orbital structures are unremarkable. 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 Could be further characterized with MR imaging as warranted. No other acute intracranial abnormality. These results were called by telephone at the time of interpretation on 09/17/2020 at 11:45 pm to provider Benjiman Core , who verbally acknowledged these results. Electronically  Signed   By: Kreg Shropshire M.D.   On: 09/17/2020 23:45   MR ANGIO HEAD WO CONTRAST  Result Date: 09/18/2020 CLINICAL DATA:  Altered mental status. EXAM: MRI HEAD WITHOUT CONTRAST MRA HEAD WITHOUT CONTRAST TECHNIQUE: Multiplanar, multi-echo pulse sequences of the brain and surrounding structures were acquired without intravenous contrast. Angiographic images of the Circle of Willis were acquired using MRA technique without intravenous contrast. COMPARISON:  No pertinent prior exam. FINDINGS: MRI HEAD FINDINGS Brain: Restricted diffusion in the bilateral thalamus, bilateral internal capsule, left centrum semiovale, and right subcortical frontal. These have the appearance of acute infarcts. Mild small vessel ischemic type change in the pons and hemispheric white matter. No evidence of hemorrhage, hydrocephalus, or collection. Vascular: Preserved flow voids. Skull and upper cervical spine: Normal marrow signal Sinuses/Orbits: Negative Other: Significant and progressive motion artifact MRA HEAD FINDINGS Very motion degraded. Carotid, vertebral, and basilar arteries and major branches are patent with gross symmetry. IMPRESSION: Brain MRI: 1. Scattered small vessel infarcts in the bilateral thalamus, bilateral  internal capsule, and bilateral hemispheric white matter. These are not in a unified arterial or venous distribution and are of uncertain cause. No revealing background chronic brain findings. 2. Significantly motion degraded Intracranial MRA: Very motion degraded and of limited utility other than documenting patency of major vessels. Electronically Signed   By: Marnee Spring M.D.   On: 09/18/2020 08:43   MR BRAIN WO CONTRAST  Result Date: 09/18/2020 CLINICAL DATA:  Altered mental status. EXAM: MRI HEAD WITHOUT CONTRAST MRA HEAD WITHOUT CONTRAST TECHNIQUE: Multiplanar, multi-echo pulse sequences of the brain and surrounding structures were acquired without intravenous contrast. Angiographic images of the Circle of Willis were acquired using MRA technique without intravenous contrast. COMPARISON:  No pertinent prior exam. FINDINGS: MRI HEAD FINDINGS Brain: Restricted diffusion in the bilateral thalamus, bilateral internal capsule, left centrum semiovale, and right subcortical frontal. These have the appearance of acute infarcts. Mild small vessel ischemic type change in the pons and hemispheric white matter. No evidence of hemorrhage, hydrocephalus, or collection. Vascular: Preserved flow voids. Skull and upper cervical spine: Normal marrow signal Sinuses/Orbits: Negative Other: Significant and progressive motion artifact MRA HEAD FINDINGS Very motion degraded. Carotid, vertebral, and basilar arteries and major branches are patent with gross symmetry. IMPRESSION: Brain MRI: 1. 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 These are not in a unified arterial or venous distribution and are of uncertain cause. No revealing background chronic brain findings. 2. Significantly motion degraded Intracranial MRA: Very motion degraded and of limited utility other than documenting patency of major vessels. Electronically Signed   By: Marnee Spring M.D.   On: 09/18/2020 08:43   IR GASTROSTOMY TUBE MOD SED  Result Date: 09/22/2020 INDICATION: 65 year old with altered mental status and dysphagia. Request for gastrostomy tube placement. EXAM: PERCUTANEOUS GASTROSTOMY TUBE WITH FLUOROSCOPIC GUIDANCE Physician: Rachelle Hora. Lowella Dandy, MD MEDICATIONS: Ancef 2 g; Antibiotics were administered within 1 hour of the procedure. Glucagon 0.5 mg ANESTHESIA/SEDATION: Versed 2.0 mg IV; Fentanyl 100 mcg IV Moderate Sedation Time:  42 minutes The patient was continuously monitored during the procedure by the interventional radiology nurse under my direct supervision. FLUOROSCOPY TIME:  Fluoroscopy Time: 12 minutes, 6 seconds, 19 mGy CONTRAST:  20 mL Omnipaque 300 COMPLICATIONS: None immediate. PROCEDURE: Informed consent was obtained for a percutaneous gastrostomy tube. The patient was placed on the interventional table. An orogastric tube was placed with fluoroscopic guidance. The anterior abdomen was prepped and  draped in sterile fashion. Maximal barrier sterile technique was utilized including caps, mask, sterile gowns, sterile gloves, sterile drape, hand hygiene and skin antiseptic. Stomach was inflated with air through the orogastric tube. The skin and subcutaneous tissues were anesthetized with 1% lidocaine. A 17 gauge needle was directed into the distended stomach with fluoroscopic guidance. A wire was advanced into the stomach and a T fastener was deployed. A 9-French vascular sheath was placed and the orogastric tube was snared using a Goos[place]ck snare device. At this time, the patient was clenching her teeth and we could not open her mouth in order to place the pull-through gastrostomy tube. As a result, we converted to a push through gastrostomy tube. Skin around the 9 French drain was anesthetized with 1% lidocaine. Using fluoroscopic guidance, 2 additional T-fasteners were deployed. The T fastener associated with the 9 French sheath was cut. The 9 French sheath was  removed and dilated up to an 18 [place] peel-away sheath. A 16 French Entuit gastrostomy tube was advanced over the wire and through the peel-away sheath. The balloon was inflated with approximately 8 mL of saline. Peel-away sheath and wire were removed. Contrast injection confirmed placement in the stomach. Fluoroscopic images were obtained for documentation. The gastrostomy tube was flushed with normal saline. FINDINGS: 61 French balloon retention gastrostomy tube is positioned in the stomach. Total of 3 T-fasteners were deployed and there are still 2 T-fasteners intact. Contrast injection confirmed placement in the stomach. IMPRESSION: 1. Successful placement of a percutaneous gastrostomy tube using fluoroscopy. This is a balloon retention gastrostomy tube. 2. There are 2 remaining T-fasteners in place. Plan to cut the T-fasteners in 7-10 days. Electronically Signed   By: Richarda Overlie M.D.   On: 09/22/2020 12:07   US Carotid Bilateral (at [facility] and [facility] only)  Result Date: 09/18/2020 CLINICAL DATA:  Altered mental status Hypertension Stroke EXAM: BILATERAL CAROTID DUPLEX ULTRASOUND TECHNIQUE: Wallace Cullens scale imaging, color Doppler and duplex ultrasound were performed of bilateral carotid and vertebral arteries in the neck. COMPARISON:  None. FINDINGS: Criteria: Quantification of carotid stenosis is based on velocity parameters that correlate the residual internal carotid diameter with NASCET-based stenosis levels, using the diameter of the distal internal carotid lumen as the denominator for stenosis measurement. The following velocity measurements were obtained: RIGHT ICA: 156/29 cm/sec CCA: 77/11 cm/sec SYSTOLIC ICA/CCA RATIO:  2.0 ECA: 173 cm/sec LEFT ICA: 83/18 cm/sec CCA: 85/14 cm/sec SYSTOLIC ICA/CCA RATIO:  1.0 ECA: 92 cm/sec RIGHT CAROTID ARTERY: Heterogeneous calcified and noncalcified plaque of the distal common and proximal internal carotid arteries with velocity indicative of 50-69% stenosis. RIGHT VERTEBRAL  ARTERY:  Antegrade flow. LEFT CAROTID ARTERY: Heterogeneous calcified and noncalcified plaque of the distal common and proximal internal carotid arteries with velocity parameters indicative of less than 50% stenosis. LEFT VERTEBRAL ARTERY:  Antegrade flow. IMPRESSION: 1. 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 2. Less than 50% stenosis of the left internal carotid artery. 3. Long segment shadowing plaque present in both common and internal carotid arteries, which can obscure higher velocities. Further evaluation with MRI or CT of the neck should be performed for more precise evaluation of degree of stenosis. Electronically Signed   By: Acquanetta Belling M.D.   On: 09/18/2020 10:01   DG Chest Portable 1 View  Result Date: 09/17/2020 CLINICAL DATA:  Altered mental status EXAM: PORTABLE CHEST 1 VIEW COMPARISON:  None FINDINGS: There is hyperinflation of the lungs compatible with COPD. Heart and mediastinal contours are within normal  limits. No focal opacities or effusions. No acute bony abnormality. IMPRESSION: COPD.  No active disease. Electronically Signed   By: Charlett Nose M.D.   On: 09/17/2020 23:02   ECHOCARDIOGRAM COMPLETE  Result Date: 09/18/2020    ECHOCARDIOGRAM REPORT   Patient Name:   [facility] Date of Exam: 09/18/2020 Medical Rec #:  [id number]       Height: Accession #:    [id number]      Weight: Date of Birth:  1955-11-17       BSA: Patient Age:    64 years        BP:           213/100 mmHg Patient Gender: F               HR:           59 bpm. [facility]am [facility] Procedure: 2D Echo, Cardiac Doppler and Color Doppler Indications:    Stroke  History:        Patient has no prior history of Echocardiogram examinations.                 Stroke; Risk Factors:Hypertension. Height and Weight not                 available.  Sonographer:    Mikki Harbor Referring Phys: [id number] ASIA B ZIERLE-GHOSH  Sonographer Comments: Height and Weight not available IMPRESSIONS  1. Left ventricular  ejection fraction, by estimation, is 60 to 65%. The left ventricle has normal function. The left ventricle has no regional wall motion abnormalities. There is mild left ventricular hypertrophy. Left ventricular diastolic parameters were normal.  2. Right ventricular systolic function is normal. The right ventricular size is normal. Tricuspid regurgitation signal is inadequate for assessing PA pressure.  3. The mitral valve is grossly normal. Trivial mitral valve regurgitation.  4. The aortic valve is tricuspid. Aortic valve regurgitation is not visualized. Mild aortic valve sclerosis is present, with no evidence of aortic valve stenosis. Aortic valve mean gradient measures 3.0 mmHg.  5. The inferior vena cava is normal in size with greater than 50% respiratory variability, suggesting right atrial pressure of 3 mmHg. FINDINGS  Left Ventricle: Left ventricular ejection fraction, by estimation, is 60 to 65%. The left ventricle has normal function. The left ventricle has no regional wall motion abnormalities. The left ventricular internal cavity size was normal in size. There is  mild left ventricular hypertrophy. Left ventricular diastolic parameters were normal. Right Ventricle: The right ventricular size is normal. No increase in right ventricular wall thickness. Right ventricular systolic function is normal. Tricuspid regurgitation signal is inadequate for assessing PA pressure. Left Atrium: Left atrial size was normal in size. Right Atrium: Right atrial size was normal in size. Pericardium: There is no evidence of pericardial effusion. Presence of pericardial fat pad. Mitral Valve: The mitral valve is grossly normal. Mild mitral annular calcification. Trivial mitral valve regurgitation. MV peak gradient, 2.7 mmHg. The mean mitral valve gradient is 1.0 mmHg. Tricuspid Valve: The tricuspid valve is grossly normal. Tricuspid valve regurgitation is trivial. Aortic Valve: The aortic valve is tricuspid. There is mild  aortic valve annular calcification. Aortic valve regurgitation is not visualized. Mild aortic valve sclerosis is present, with no evidence of aortic valve stenosis. Aortic valve mean gradient measures 3.0 mmHg. Aortic valve peak gradient measures 5.7 mmHg. Aortic valve area, by VTI measures 2.71 cm. Pulmonic Valve: The pulmonic valve was grossly normal. Pulmonic valve regurgitation is  trivial. Aorta: The aortic root is normal in size and structure. Venous: The inferior vena cava is normal in size with greater than 50% respiratory variability, suggesting right atrial pressure of 3 mmHg. IAS/Shunts: The interatrial septum appears to be lipomatous. No atrial level shunt detected by color flow Doppler.  LEFT VENTRICLE PLAX 2D LVIDd:         4.38 cm  Diastology LVIDs:         2.91 cm  LV e' medial:    7.10 cm/s LV PW:         1.04 cm  LV E/e' medial:  11.9 LV IVS:        1.06 cm  LV e' lateral:   7.62 cm/s LVOT diam:     2.00 cm  LV E/e' lateral: 11.0 LV SV:         90 LVOT Area:     3.14 cm  RIGHT VENTRICLE RV Basal diam:  3.37 cm RV Mid diam:    3.06 cm RV S prime:     10.20 cm/s TAPSE (M-mode): 2.3 cm LEFT ATRIUM             RIGHT ATRIUM LA diam:        3.00 cm RA Area:     13.30 cm LA Vol (A2C):   49.5 ml RA Volume:   31.30 ml LA Vol (A4C):   28.7 ml LA Biplane Vol: 40.5 ml  AORTIC VALVE AV Area (Vmax):    2.85 cm AV Area (Vmean):   2.55 cm AV Area (VTI):     2.71 cm AV Vmax:           119.00 cm/s AV Vmean:          86.100 cm/s AV VTI:            0.330 m AV Peak Grad:      5.7 mmHg AV Mean Grad:      3.0 mmHg LVOT Vmax:         108.00 cm/s LVOT Vmean:        69.800 cm/s LVOT VTI:          0.285 m LVOT/AV VTI ratio: 0.86  AORTA Ao Root diam: 3.30 cm MITRAL VALVE MV Area (PHT): 3.15 cm    SHUNTS MV Area VTI:   2.54 cm    Systemic VTI:  0.29 m MV Peak grad:  2.7 mmHg    Systemic Diam: 2.00 cm MV Mean grad:  1.0 mmHg MV Vmax:       0.81 m/s MV Vmean:      41.2 cm/s MV Decel Time: 241 msec MV E velocity: 84.20  cm/s MV A velocity: 72.70 cm/s MV E/A ratio:  1.16 Nona Dell MD Electronically signed by Nona Dell MD Signature Date/Time: 09/18/2020/2:03:27 PM    Final     Catarina Hartshorn, DO  [facility]  If 7PM-7AM, please contact night-coverage [email or web address] Password [email or web address] 09/22/2020, 4:48 PM   LOS: 4 days

## 2020-09-22 NOTE — Progress Notes (Signed)
   09/22/20 1641  Assess: MEWS Score  Temp 99.3 F (37.4 C)  BP (!) 173/77  Pulse Rate 83  Resp 13  Level of Consciousness Responds to Voice  SpO2 100 %  O2 Device Nasal Cannula  O2 Flow Rate (L/min) 2 L/min  Assess: MEWS Score  MEWS Temp 0  MEWS Systolic 0  MEWS Pulse 0  MEWS RR 1  MEWS LOC 1  MEWS Score 2  MEWS Score Color Yellow  Notify: Charge Nurse/RN  Name of Charge Nurse/RN Notified Chales Abrahams  Date Charge Nurse/RN Notified 09/22/20  Time Charge Nurse/RN Notified 1702  Notify: Provider  Provider Name/Title Tat  Date Provider Notified 09/22/20  Time Provider Notified 1641  Notification Type Face-to-face  Notification Reason  (Dr. Arbutus Leas in room)  Provider response No new orders  Date of Provider Response 09/22/20  Time of Provider Response 1641  Document  Patient Outcome Other (Comment) (stable)  Progress note created (see row info) Yes

## 2020-09-22 NOTE — Procedures (Signed)
Interventional Radiology Procedure:   Indications: Dysphagia  Procedure: Gastrostomy tube placement  Findings: Placement of 18 Fr balloon retention tube with T-fasteners.  Converted pull thru to push thru because patient was unable to open mouth during procedure.  Complications: None     EBL: less than 10 ml  Plan: Ok to use gastrostomy tube for feeds on 09/23/20.  Will need T-fasteners removed in 7-10 days.   Michae Grimley R. Lowella Dandy, MD  Pager: [phone]

## 2020-09-22 NOTE — Care Management Important Message (Signed)
Important Message  Patient Details  Name: Kendra Lee MRN: [id number] Date of Birth: [birth year]   Medicare Important Message Given:  Yes     Corey Harold 09/22/2020, 1:21 PM

## 2020-09-22 NOTE — Sedation Documentation (Addendum)
Per [facility] patient lays on right side non verbal responds to voice an drools clear. BP 186/72 HR 60 98% room air.

## 2020-09-23 DIAGNOSIS — G9341 Metabolic encephalopathy: Secondary | ICD-10-CM | POA: Diagnosis not present

## 2020-09-23 DIAGNOSIS — I10 Essential (primary) hypertension: Secondary | ICD-10-CM | POA: Diagnosis not present

## 2020-09-23 DIAGNOSIS — I639 Cerebral infarction, unspecified: Secondary | ICD-10-CM | POA: Diagnosis not present

## 2020-09-23 LAB — CULTURE, BLOOD (ROUTINE X 2)
Culture: NO GROWTH
Special Requests: ADEQUATE

## 2020-09-23 MED ORDER — AMLODIPINE BESYLATE 5 MG PO TABS
5.0000 mg | ORAL_TABLET | Freq: Every day | ORAL | Status: DC
  Administered 2020-09-23 – 2020-10-09 (×12): 5 mg
  Filled 2020-09-23 (×12): qty 1

## 2020-09-23 MED ORDER — CLOPIDOGREL BISULFATE 75 MG PO TABS
75.0000 mg | ORAL_TABLET | Freq: Every day | ORAL | Status: DC
  Administered 2020-09-23 – 2020-10-09 (×12): 75 mg
  Filled 2020-09-23 (×13): qty 1

## 2020-09-23 MED ORDER — FREE WATER
200.0000 mL | Freq: Three times a day (TID) | Status: DC
  Administered 2020-09-23 – 2020-10-09 (×33): 200 mL

## 2020-09-23 MED ORDER — ATORVASTATIN CALCIUM 40 MG PO TABS
80.0000 mg | ORAL_TABLET | Freq: Every day | ORAL | Status: DC
  Administered 2020-09-23 – 2020-10-09 (×12): 80 mg
  Filled 2020-09-23 (×12): qty 2

## 2020-09-23 MED ORDER — OSMOLITE 1.2 CAL PO LIQD
1000.0000 mL | ORAL | Status: DC
  Administered 2020-09-23 – 2020-09-27 (×3): 1000 mL

## 2020-09-23 MED ORDER — HYDRALAZINE HCL 10 MG PO TABS
10.0000 mg | ORAL_TABLET | Freq: Three times a day (TID) | ORAL | Status: DC
  Administered 2020-09-23 – 2020-10-09 (×32): 10 mg via ORAL
  Filled 2020-09-23 (×34): qty 1

## 2020-09-23 MED ORDER — ASPIRIN 81 MG PO CHEW
81.0000 mg | CHEWABLE_TABLET | Freq: Every day | ORAL | Status: DC
  Administered 2020-09-23 – 2020-09-28 (×6): 81 mg
  Filled 2020-09-23 (×7): qty 1

## 2020-09-23 NOTE — TOC Progression Note (Signed)
Transition of Care [facility]) - Progression Note    Patient Details  Name: Aseret Hoffman MRN: [id number] Date of Birth: 1955-06-25  [facility] CM/SW Contact  Barry Brunner, LCSW Phone Number: 09/23/2020, 4:02 PM  Clinical Narrative:    CSW contacted Debbie with [employer] to inquire if auth had been received. Eunice Blase reported that patient had not received insurance auth. TOC to follow.    Expected Discharge Plan: Skilled Nursing Facility Barriers to Discharge: Continued Medical Work up  Expected Discharge Plan and Services Expected Discharge Plan: Skilled Nursing Facility       Living arrangements for the past 2 months: Single Family Home                                       Social Determinants of Health (SDOH) Interventions    Readmission Risk Interventions Readmission Risk Prevention Plan 09/20/2020  Medication Screening Complete  Transportation Screening Complete

## 2020-09-23 NOTE — Progress Notes (Signed)
PROGRESS NOTE  Kendra Lee WUJ:[id number] DOB: March 13, 1956 DOA: 09/17/2020 PCP: Pcp, No  Brief History:  65 y.o. female, with no known medical history, who presents to the ED with altered mental status. History is quite limited 2/2 patient's acute encephalopathy. Earlier, family was able to provide some history to the ED provider. They reported that the patient's last known normal was 6/24. She has been somnolent, and not taking care of herself in that interval. Family was not aware of an EtOH or illicit drug use. They are not aware of an trauma, and there are not signs of trauma on exam. Family reported no infectious symptoms or fevers.  Work up revealed patient suffered bilateral thalamic infarcts that have contributed to her encephalopathy.  After a goals of care discussion, patient's son wanted full scope of care.  Patient failed swallow eval and PEG tube was placed 09/22/20  ED: Vitals stable, encephalopathic, CT head shows focal regions of hypoattenuation seen in the genu of the right internal capsule, posterior limb of the left internal capsule and right thalamus which could reflect age-indeterminate infarction.  No other acute abnormality     Assessment/Plan: Acute ischemic stroke/Acute metabolic Encephalopathy -case discussed with neurology, Dr. Eligah East most likely due to stroke -MR brain--scattered bilateral infarcts of thalamus -carotid US--R-ICA--50-69%; L-ICA <50% -MRA brain--no LVO -Echo (TTE)--EF 60-65%, no WMA, no PFO -A1C--5.5 -LDL--138 -continue ASA and plavix -discussed with cardiology--not a good candidate for TEE nor loop recorder -TSH--1.927 -ammonia 15 -B12--262 -folate 4.8 -UA--no pyuria -09/23/20--more awake, occasionally following commands   Hyperlipidemia -continue statin when safe to use PEG   Dysphagia -speech eval-->not safe for po intake -IR placed PEG 09/22/20 -started enteral feeding 7/2   Hypertension -allowing  permissive hypertension initially -start IV hydralazine prn -start amlodipine when able to use PEG -start hydralazine per PEG   Hypokalemia -replete -mag 2.1               Status is: Inpatient   Remains inpatient appropriate because:Altered mental status   Dispo: The patient is from: Home              Anticipated d/c is to: SNF              Patient currently is not medically stable to d/c.              Difficult to place patient No     Total time spent 35 minutes.  Greater than 50% spent face to face counseling and coordinating care.             Family Communication:   left VM for son   Consultants:  neurology, IR   Code Status:  FULL   DVT Prophylaxis:  [place] Heparin     Procedures: As Listed in Progress Note Above   Antibiotics: None   Subjective: Patient more awake.  Denies f/c, cp, sob, vomiting.  Objective: Vitals:   09/22/20 2300 09/23/20 0824 09/23/20 0959 09/23/20 1600  BP: (!) 174/76 (!) 203/64 (!) 177/64 (!) 198/55  Pulse: 64 (!) 58  63  Resp: Temp: 99.3 F (37.4 C) 98.9 F (37.2 C)  98.3 F (36.8 C)  TempSrc: Axillary Axillary  Axillary  SpO2: 98% 98%  95%  Weight:      Height:        Intake/Output Summary (Last 24 hours) at 09/23/2020 1759 Last data filed at 09/23/2020 1654 Gross per  24 hour  Intake 3180.44 ml  Output 1500 ml  Net 1680.44 ml   Weight change:  Exam:  General:  Pt is alert, follows commands appropriately, not in acute distress HEENT: No icterus, No thrush, No neck mass, [place]/AT Cardiovascular: RRR, S1/S2, no rubs, no gallops Respiratory: bibasilar rales. No wheeze Abdomen: Soft/+BS, non tender, non distended, no guarding Extremities: No edema, No lymphangitis, No petechiae, No rashes, no synovitis   Data Reviewed: I have personally reviewed following labs and imaging studies Basic Metabolic Panel: Recent Labs  Lab 09/17/20 2200 09/18/20 0351 09/21/20 0339  NA 140 140 137  K 3.5 3.6 2.9*  CL 103  106 100  CO2 25 25 25   GLUCOSE 120* 149* 110*  BUN 13 15 8   CREATININE 0.77 0.66 0.63  CALCIUM 9.5 9.2 8.6*  MG  --  2.1  --    Liver Function Tests: Recent Labs  Lab 09/17/20 2200 09/18/20 0351  AST 19 16  ALT 13 12  ALKPHOS 77 66  BILITOT 0.7 0.6  PROT 7.5 6.6  ALBUMIN 4.2 3.7   No results for input(s): LIPASE, AMYLASE in the last 168 hours. Recent Labs  Lab 09/18/20 0002 09/21/20 0339  AMMONIA 15 26   Coagulation Profile: Recent Labs  Lab 09/22/20 0455  INR 1.0   CBC: Recent Labs  Lab 09/17/20 2200 09/18/20 0351 09/21/20 0339  WBC 11.6* 9.8 7.1  NEUTROABS 8.9*  --   --   HGB 15.1* 13.8 13.5  HCT 45.0 41.0 40.2  MCV 93.8 94.0 95.3  PLT 308 301 269   Cardiac Enzymes: No results for input(s): CKTOTAL, CKMB, CKMBINDEX, TROPONINI in the last 168 hours. BNP: Invalid input(s): POCBNP CBG: No results for input(s): GLUCAP in the last 168 hours. HbA1C: No results for input(s): HGBA1C in the last 72 hours. Urine analysis:    Component Value Date/Time   COLORURINE YELLOW 09/18/2020 0020   APPEARANCEUR CLEAR 09/18/2020 0020   LABSPEC 1.013 09/18/2020 0020   PHURINE 5.0 09/18/2020 0020   GLUCOSEU NEGATIVE 09/18/2020 0020   HGBUR SMALL (A) 09/18/2020 0020   BILIRUBINUR NEGATIVE 09/18/2020 0020   KETONESUR 20 (A) 09/18/2020 0020   PROTEINUR NEGATIVE 09/18/2020 0020   NITRITE NEGATIVE 09/18/2020 0020   LEUKOCYTESUR NEGATIVE 09/18/2020 0020   Sepsis Labs: @LABRCNTIP (procalcitonin:4,lacticidven:4) ) Recent Results (from the past 240 hour(s))  Culture, blood (routine x 2)     Status: None   Collection Time: 09/17/20 10:00 PM   Specimen: Right Antecubital; Blood  Result Value Ref Range Status   Specimen Description RIGHT ANTECUBITAL  Final   Special Requests   Final    BOTTLES DRAWN AEROBIC AND ANAEROBIC Blood Culture results may not be optimal due to an excessive volume of blood received in culture bottles   Culture   Final    NO GROWTH 5  DAYS Performed at [facility], [address] [place], [place] [address]    Report Status 09/22/2020 FINAL  Final  Resp Panel by RT-PCR (Flu A&B, Covid) Nasopharyngeal Swab     Status: None   Collection Time: 09/17/20 10:35 PM   Specimen: Nasopharyngeal Swab; Nasopharyngeal(NP) swabs in vial transport medium  Result Value Ref Range Status   SARS Coronavirus 2 by RT PCR NEGATIVE NEGATIVE Final    Comment: (NOTE) SARS-CoV-2 target nucleic acids are NOT DETECTED.  The SARS-CoV-2 RNA is generally detectable in upper respiratory specimens during the acute phase of infection. The lowest concentration of SARS-CoV-2 viral copies this assay can detect  is 138 copies/mL. A negative result does not preclude SARS-Cov-2 infection and should not be used as the sole basis for treatment or other patient management decisions. A negative result may occur with  improper specimen collection/handling, submission of specimen other than nasopharyngeal swab, presence of viral mutation(s) within the areas targeted by this assay, and inadequate number of viral copies(<138 copies/mL). A negative result must be combined with clinical observations, patient history, and epidemiological information. The expected result is Negative.  Fact Sheet for Patients:  BloggerCourse.com  Fact Sheet for Healthcare Providers:  SeriousBroker.it  This test is no t yet approved or cleared by the [place] FDA and  has been authorized for detection and/or diagnosis of SARS-CoV-2 by FDA under an Emergency Use Authorization (EUA). This EUA will remain  in effect (meaning this test can be used) for the duration of the COVID-19 declaration under Section 564(b)(1) of the Act, 21 U.S.C.section 360bbb-3(b)(1), unless the authorization is terminated  or revoked sooner.       Influenza A by PCR NEGATIVE NEGATIVE Final   Influenza B by PCR NEGATIVE NEGATIVE Final    Comment:  (NOTE) The Xpert Xpress SARS-CoV-2/FLU/RSV plus assay is intended as an aid in the diagnosis of influenza from Nasopharyngeal swab specimens and should not be used as a sole basis for treatment. Nasal washings and aspirates are unacceptable for Xpert Xpress SARS-CoV-2/FLU/RSV testing.  Fact Sheet for Patients: BloggerCourse.com  Fact Sheet for Healthcare Providers: SeriousBroker.it  This test is not yet approved or cleared by the [place] FDA and has been authorized for detection and/or diagnosis of SARS-CoV-2 by FDA under an Emergency Use Authorization (EUA). This EUA will remain in effect (meaning this test can be used) for the duration of the COVID-19 declaration under Section 564(b)(1) of the Act, 21 U.S.C. section 360bbb-3(b)(1), unless the authorization is terminated or revoked.  Performed at [facility], [address] [place], [place] [address]   Culture, blood (routine x 2)     Status: None   Collection Time: 09/18/20 12:01 AM   Specimen: BLOOD LEFT ARM  Result Value Ref Range Status   Specimen Description BLOOD LEFT ARM  Final   Special Requests   Final    BOTTLES DRAWN AEROBIC AND ANAEROBIC Blood Culture adequate volume   Culture   Final    NO GROWTH 5 DAYS Performed at [facility], [address], [place], [place] [address]    Report Status 09/23/2020 FINAL  Final     Scheduled Meds:  amLODipine  5 mg Per Tube Daily   aspirin  81 mg Per Tube Daily   atorvastatin  80 mg Per Tube Daily   clopidogrel  75 mg Per Tube Daily   free water  200 mL Per Tube Q8H   heparin  5,000 Units Subcutaneous Q8H   mouth rinse  15 mL Mouth Rinse BID   scopolamine  1 patch Transdermal Q72H   thiamine injection  100 mg Intravenous Daily   Continuous Infusions:  dextrose 5 % and 0.9 % NaCl with KCl 20 mEq/L 75 mL/hr at 09/23/20 1654   feeding supplement (OSMOLITE 1.2 CAL) 1,000 mL (09/23/20 1023)   folic acid (FOLVITE) IVPB  Stopped (09/23/20 1051)    Procedures/Studies: CT ABDOMEN WO CONTRAST  Result Date: 09/21/2020 CLINICAL DATA:  Evaluate anatomy prior to potential percutaneous gastrostomy tube placement. EXAM: CT ABDOMEN WITHOUT CONTRAST TECHNIQUE: Multidetector CT imaging of the abdomen was performed following the standard protocol without IV contrast. COMPARISON:  None.  FINDINGS: The lack of intravenous contrast limits the ability to evaluate solid abdominal organs. Lower chest: Limited visualization of the lower thorax demonstrates minimal dependent subpleural atelectasis. Normal heart size. No pericardial effusion. Hepatobiliary: Normal hepatic contour. Normal noncontrast appearance of the gallbladder given degree distention. No radiopaque gallstones. There is a trace amount of perihepatic ascites. Pancreas: Normal noncontrast appearance of the pancreas. Spleen: Normal noncontrast appearance of the spleen. 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 Normal noncontrast appearance of the right kidney. No renal stones. No right-sided urinary obstruction. Normal noncontrast appearance the bilateral adrenal glands. The urinary bladder was not imaged. Stomach/Bowel: The anterior wall the stomach is well apposed against the ventral wall of the abdomen without interposition of the hepatic parenchyma or the transverse colon. Moderate colonic stool burden without evidence of enteric obstruction. No pneumoperitoneum, pneumatosis or portal venous gas. Vascular/Lymphatic: Large amount of irregular calcified atherosclerotic plaque within a bilobed ectatic abdominal aorta with dominant caudal component measuring 2.6 cm and maximal oblique short axis coronal diameter (coronal image 33, series 5). No bulky retroperitoneal or mesenteric lymphadenopathy. Other: Regional soft tissues appear normal. Musculoskeletal: No acute or aggressive osseous  abnormalities. Mild DDD of L4-L5 with disc space height loss, endplate irregularity and sclerosis. IMPRESSION: 1. Gastric anatomy amenable to potential percutaneous gastrostomy tube placement as indicated. 2. 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 3. Mildly ectatic abdominal aorta measuring 2.6 cm in maximal diameter. Recommend follow-up aortic ultrasound in 5 years. This recommendation follows ACR consensus guidelines: White Paper of the ACR Incidental Findings Committee II on Vascular Findings. J Am Coll Radiol 2013; 10:789-794. 4. Aortic aneurysm NOS (ICD10-I71.9). Electronically Signed   By: Simonne Come M.D.   On: 09/21/2020 09:36   CT Head Wo Contrast  Result Date: 09/17/2020 CLINICAL DATA:  Mental status change, found this afternoon on couch EXAM: CT HEAD WITHOUT CONTRAST TECHNIQUE: Contiguous axial images were obtained from the base of the skull through the vertex without intravenous contrast. COMPARISON:  None. FINDINGS: Brain: Foci of hypoattenuation present in the right internal capsule as as a separate focus in the right caudate and posterior limb of the left internal capsule could reflect sequela of age-indeterminate lacunar type infarcts difficult to fully assess given a background of more diffuse patchy white matter hypoattenuation typically indicative of microvascular angiopathy in a patient of this age. Additional hypoattenuation in the pons and brainstem is more nonspecific given streak artifact across skull base. Symmetric prominence of the ventricles, cisterns and sulci compatible with parenchymal volume loss. No hyperdense hemorrhage. No mass effect or midline shift. No extra-axial collection. Scattered benign dural calcifications. Vascular: Atherosclerotic calcification of the carotid siphons. No hyperdense vessel. Skull: No calvarial fracture or suspicious osseous lesion. No scalp swelling or hematoma. Sinuses/Orbits:  Paranasal sinuses and mastoid air cells are predominantly clear. Other: Included orbital structures are unremarkable. 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 Could be further characterized with MR imaging as warranted. No other acute intracranial abnormality. These results were called by telephone at the time of interpretation on 09/17/2020 at 11:45 pm to provider Benjiman Core , who verbally acknowledged these results. Electronically Signed   By: Kreg Shropshire M.D.   On: 09/17/2020  23:45   MR ANGIO HEAD WO CONTRAST  Result Date: 09/18/2020 CLINICAL DATA:  Altered mental status. EXAM: MRI HEAD WITHOUT CONTRAST MRA HEAD WITHOUT CONTRAST TECHNIQUE: Multiplanar, multi-echo pulse sequences of the brain and surrounding structures were acquired without intravenous contrast. Angiographic images of the Circle of Willis were acquired using MRA technique without intravenous contrast. COMPARISON:  No pertinent prior exam. FINDINGS: MRI HEAD FINDINGS Brain: Restricted diffusion in the bilateral thalamus, bilateral internal capsule, left centrum semiovale, and right subcortical frontal. These have the appearance of acute infarcts. Mild small vessel ischemic type change in the pons and hemispheric white matter. No evidence of hemorrhage, hydrocephalus, or collection. Vascular: Preserved flow voids. Skull and upper cervical spine: Normal marrow signal Sinuses/Orbits: Negative Other: Significant and progressive motion artifact MRA HEAD FINDINGS Very motion degraded. Carotid, vertebral, and basilar arteries and major branches are patent with gross symmetry. IMPRESSION: Brain MRI: 1. Scattered small vessel infarcts in the bilateral thalamus, bilateral  internal capsule, and bilateral hemispheric white matter. These are not in a unified arterial or venous distribution and are of uncertain cause. No revealing background chronic brain findings. 2. Significantly motion degraded Intracranial MRA: Very motion degraded and of limited utility other than documenting patency of major vessels. Electronically Signed   By: Marnee Spring M.D.   On: 09/18/2020 08:43   MR BRAIN WO CONTRAST  Result Date: 09/18/2020 CLINICAL DATA:  Altered mental status. EXAM: MRI HEAD WITHOUT CONTRAST MRA HEAD WITHOUT CONTRAST TECHNIQUE: Multiplanar, multi-echo pulse sequences of the brain and surrounding structures were acquired without intravenous contrast. Angiographic images of the Circle of Willis were acquired using MRA technique without intravenous contrast. COMPARISON:  No pertinent prior exam. FINDINGS: MRI HEAD FINDINGS Brain: Restricted diffusion in the bilateral thalamus, bilateral internal capsule, left centrum semiovale, and right subcortical frontal. These have the appearance of acute infarcts. Mild small vessel ischemic type change in the pons and hemispheric white matter. No evidence of hemorrhage, hydrocephalus, or collection. Vascular: Preserved flow voids. Skull and upper cervical spine: Normal marrow signal Sinuses/Orbits: Negative Other: Significant and progressive motion artifact MRA HEAD FINDINGS Very motion degraded. Carotid, vertebral, and basilar arteries and major branches are patent with gross symmetry. IMPRESSION: Brain MRI: 1. 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 These are not in a unified arterial or venous distribution and are of uncertain cause. No revealing background chronic brain findings. 2. Significantly motion degraded Intracranial MRA: Very motion degraded and of limited utility other than documenting patency of major vessels. Electronically Signed   By: Marnee Spring M.D.   On: 09/18/2020 08:43   IR GASTROSTOMY TUBE MOD SED  Result Date: 09/22/2020 INDICATION: 65 year old with altered mental status and dysphagia. Request for gastrostomy tube placement. EXAM: PERCUTANEOUS GASTROSTOMY TUBE WITH FLUOROSCOPIC GUIDANCE Physician: Rachelle Hora. Lowella Dandy, MD MEDICATIONS: Ancef 2 g; Antibiotics were administered within 1 hour of the procedure. Glucagon 0.5 mg ANESTHESIA/SEDATION: Versed 2.0 mg IV; Fentanyl 100 mcg IV Moderate Sedation Time:  42 minutes The patient was continuously monitored during the procedure by the interventional radiology nurse under my direct supervision. FLUOROSCOPY TIME:  Fluoroscopy Time: 12 minutes, 6 seconds, 19 mGy CONTRAST:  20 mL Omnipaque 300 COMPLICATIONS: None immediate. PROCEDURE: Informed consent was obtained for a percutaneous gastrostomy tube. The patient was placed on the interventional table. An orogastric tube was placed with fluoroscopic guidance. The anterior abdomen was prepped and draped in sterile fashion. Maximal barrier sterile technique was utilized including caps,  mask, sterile gowns, sterile gloves, sterile drape, hand hygiene and skin antiseptic. Stomach was inflated with air through the orogastric tube. The skin and subcutaneous tissues were anesthetized with 1% lidocaine. A 17 gauge needle was directed into the distended stomach with fluoroscopic guidance. A wire was advanced into the stomach and a T fastener was deployed. A 9-French vascular sheath was placed and the orogastric tube was snared using a Gooseneck snare device. At this time, the patient was clenching her teeth and we could not open her mouth in order to place the pull-through gastrostomy tube. As a result, we converted to a push through gastrostomy tube. Skin around the 9 French drain was anesthetized with 1% lidocaine. Using fluoroscopic guidance, 2 additional T-fasteners were deployed. The T fastener associated with the 9 French sheath was cut. The 9 French sheath was  removed and dilated up to an 18 [place] peel-away sheath. A 16 French Entuit gastrostomy tube was advanced over the wire and through the peel-away sheath. The balloon was inflated with approximately 8 mL of saline. Peel-away sheath and wire were removed. Contrast injection confirmed placement in the stomach. Fluoroscopic images were obtained for documentation. The gastrostomy tube was flushed with normal saline. FINDINGS: 24 French balloon retention gastrostomy tube is positioned in the stomach. Total of 3 T-fasteners were deployed and there are still 2 T-fasteners intact. Contrast injection confirmed placement in the stomach. IMPRESSION: 1. Successful placement of a percutaneous gastrostomy tube using fluoroscopy. This is a balloon retention gastrostomy tube. 2. There are 2 remaining T-fasteners in place. Plan to cut the T-fasteners in 7-10 days. Electronically Signed   By: Richarda Overlie M.D.   On: 09/22/2020 12:07   US Carotid Bilateral (at [facility] and [facility] only)  Result Date: 09/18/2020 CLINICAL DATA:  Altered mental status Hypertension Stroke EXAM: BILATERAL CAROTID DUPLEX ULTRASOUND TECHNIQUE: Wallace Cullens scale imaging, color Doppler and duplex ultrasound were performed of bilateral carotid and vertebral arteries in the neck. COMPARISON:  None. FINDINGS: Criteria: Quantification of carotid stenosis is based on velocity parameters that correlate the residual internal carotid diameter with NASCET-based stenosis levels, using the diameter of the distal internal carotid lumen as the denominator for stenosis measurement. The following velocity measurements were obtained: RIGHT ICA: 156/29 cm/sec CCA: 77/11 cm/sec SYSTOLIC ICA/CCA RATIO:  2.0 ECA: 173 cm/sec LEFT ICA: 83/18 cm/sec CCA: 85/14 cm/sec SYSTOLIC ICA/CCA RATIO:  1.0 ECA: 92 cm/sec RIGHT CAROTID ARTERY: Heterogeneous calcified and noncalcified plaque of the distal common and proximal internal carotid arteries with velocity indicative of 50-69% stenosis. RIGHT VERTEBRAL  ARTERY:  Antegrade flow. LEFT CAROTID ARTERY: Heterogeneous calcified and noncalcified plaque of the distal common and proximal internal carotid arteries with velocity parameters indicative of less than 50% stenosis. LEFT VERTEBRAL ARTERY:  Antegrade flow. IMPRESSION: 1. 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 2. Less than 50% stenosis of the left internal carotid artery. 3. Long segment shadowing plaque present in both common and internal carotid arteries, which can obscure higher velocities. Further evaluation with MRI or CT of the neck should be performed for more precise evaluation of degree of stenosis. Electronically Signed   By: Acquanetta Belling M.D.   On: 09/18/2020 10:01   DG Chest Portable 1 View  Result Date: 09/17/2020 CLINICAL DATA:  Altered mental status EXAM: PORTABLE CHEST 1 VIEW COMPARISON:  None FINDINGS: There is hyperinflation of the lungs compatible with COPD. Heart and mediastinal contours are within normal limits. No focal opacities or effusions. No acute bony abnormality. IMPRESSION: COPD.  No active disease. Electronically Signed   By: Charlett Nose M.D.   On: 09/17/2020 23:02   ECHOCARDIOGRAM COMPLETE  Result Date: 09/18/2020    ECHOCARDIOGRAM REPORT   Patient Name:   [facility] Date of Exam: 09/18/2020 Medical Rec #:  [id number]       Height: Accession #:    [id number]      Weight: Date of Birth:  28-Nov-1955       BSA: Patient Age:    64 years        BP:           213/100 mmHg Patient Gender: F               HR:           59 bpm. [facility] Procedure: 2D Echo, Cardiac Doppler and Color Doppler Indications:    Stroke  History:        Patient has no prior history of Echocardiogram examinations.                 Stroke; Risk Factors:Hypertension. Height and Weight not                 available.  Sonographer:    Mikki Harbor Referring Phys: [id number] ASIA B ZIERLE-GHOSH  Sonographer Comments: Height and Weight not available IMPRESSIONS  1. Left ventricular  ejection fraction, by estimation, is 60 to 65%. The left ventricle has normal function. The left ventricle has no regional wall motion abnormalities. There is mild left ventricular hypertrophy. Left ventricular diastolic parameters were normal.  2. Right ventricular systolic function is normal. The right ventricular size is normal. Tricuspid regurgitation signal is inadequate for assessing PA pressure.  3. The mitral valve is grossly normal. Trivial mitral valve regurgitation.  4. The aortic valve is tricuspid. Aortic valve regurgitation is not visualized. Mild aortic valve sclerosis is present, with no evidence of aortic valve stenosis. Aortic valve mean gradient measures 3.0 mmHg.  5. The inferior vena cava is normal in size with greater than 50% respiratory variability, suggesting right atrial pressure of 3 mmHg. FINDINGS  Left Ventricle: Left ventricular ejection fraction, by estimation, is 60 to 65%. The left ventricle has normal function. The left ventricle has no regional wall motion abnormalities. The left ventricular internal cavity size was normal in size. There is  mild left ventricular hypertrophy. Left ventricular diastolic parameters were normal. Right Ventricle: The right ventricular size is normal. No increase in right ventricular wall thickness. Right ventricular systolic function is normal. Tricuspid regurgitation signal is inadequate for assessing PA pressure. Left Atrium: Left atrial size was normal in size. Right Atrium: Right atrial size was normal in size. Pericardium: There is no evidence of pericardial effusion. Presence of pericardial fat pad. Mitral Valve: The mitral valve is grossly normal. Mild mitral annular calcification. Trivial mitral valve regurgitation. MV peak gradient, 2.7 mmHg. The mean mitral valve gradient is 1.0 mmHg. Tricuspid Valve: The tricuspid valve is grossly normal. Tricuspid valve regurgitation is trivial. Aortic Valve: The aortic valve is tricuspid. There is mild  aortic valve annular calcification. Aortic valve regurgitation is not visualized. Mild aortic valve sclerosis is present, with no evidence of aortic valve stenosis. Aortic valve mean gradient measures 3.0 mmHg. Aortic valve peak gradient measures 5.7 mmHg. Aortic valve area, by VTI measures 2.71 cm. Pulmonic Valve: The pulmonic valve was grossly normal. Pulmonic valve regurgitation is trivial. Aorta: The aortic root is normal in size and structure. Venous: The  inferior vena cava is normal in size with greater than 50% respiratory variability, suggesting right atrial pressure of 3 mmHg. IAS/Shunts: The interatrial septum appears to be lipomatous. No atrial level shunt detected by color flow Doppler.  LEFT VENTRICLE PLAX 2D LVIDd:         4.38 cm  Diastology LVIDs:         2.91 cm  LV e' medial:    7.10 cm/s LV PW:         1.04 cm  LV E/e' medial:  11.9 LV IVS:        1.06 cm  LV e' lateral:   7.62 cm/s LVOT diam:     2.00 cm  LV E/e' lateral: 11.0 LV SV:         90 LVOT Area:     3.14 cm  RIGHT VENTRICLE RV Basal diam:  3.37 cm RV Mid diam:    3.06 cm RV S prime:     10.20 cm/s TAPSE (M-mode): 2.3 cm LEFT ATRIUM             RIGHT ATRIUM LA diam:        3.00 cm RA Area:     13.30 cm LA Vol (A2C):   49.5 ml RA Volume:   31.30 ml LA Vol (A4C):   28.7 ml LA Biplane Vol: 40.5 ml  AORTIC VALVE AV Area (Vmax):    2.85 cm AV Area (Vmean):   2.55 cm AV Area (VTI):     2.71 cm AV Vmax:           119.00 cm/s AV Vmean:          86.100 cm/s AV VTI:            0.330 m AV Peak Grad:      5.7 mmHg AV Mean Grad:      3.0 mmHg LVOT Vmax:         108.00 cm/s LVOT Vmean:        69.800 cm/s LVOT VTI:          0.285 m LVOT/AV VTI ratio: 0.86  AORTA Ao Root diam: 3.30 cm MITRAL VALVE MV Area (PHT): 3.15 cm    SHUNTS MV Area VTI:   2.54 cm    Systemic VTI:  0.29 m MV Peak grad:  2.7 mmHg    Systemic Diam: 2.00 cm MV Mean grad:  1.0 mmHg MV Vmax:       0.81 m/s MV Vmean:      41.2 cm/s MV Decel Time: 241 msec MV E velocity: 84.20  cm/s MV A velocity: 72.70 cm/s MV E/A ratio:  1.16 Nona Dell MD Electronically signed by Nona Dell MD Signature Date/Time: 09/18/2020/2:03:27 PM    Final     Catarina Hartshorn, DO  [facility]  If 7PM-7AM, please contact night-coverage [email or web address] Password [email or web address] 09/23/2020, 5:59 PM   LOS: 5 days

## 2020-09-23 NOTE — Progress Notes (Signed)
     Percutaneous gastric tube placed in IR at [facility] 09/22/20  Spoke to [employer] She says tube site is looking well No redness; no sign of infection Seems NT  Clean and dry Afeb Has used for meds- without issue   She will continue use and progress to full use Call IR if has any needs  Ttacs will need removal 7-10 days If OP- IR will call family for time and date for appt If IP-- will remove at bedside

## 2020-09-23 NOTE — Progress Notes (Signed)
Per Dr. Arbutus Leas, NIH/Stroke d/cd

## 2020-09-23 NOTE — Plan of Care (Signed)
  Problem: Education: Goal: Knowledge of General Education information will improve Description Including pain rating scale, medication(s)/side effects and non-pharmacologic comfort measures Outcome: Progressing   Problem: Health Behavior/Discharge Planning: Goal: Ability to manage health-related needs will improve Outcome: Progressing   

## 2020-09-23 NOTE — Plan of Care (Signed)
  Problem: Education: Goal: Knowledge of General Education information will improve Description Including pain rating scale, medication(s)/side effects and non-pharmacologic comfort measures Outcome: Progressing   Problem: Education: Goal: Knowledge of disease or condition will improve Outcome: Progressing Goal: Knowledge of secondary prevention will improve Outcome: Progressing Goal: Knowledge of patient specific risk factors addressed and post discharge goals established will improve Outcome: Progressing   

## 2020-09-24 ENCOUNTER — Encounter (HOSPITAL_COMMUNITY): Payer: Self-pay | Admitting: Family Medicine

## 2020-09-24 ENCOUNTER — Other Ambulatory Visit: Payer: Self-pay

## 2020-09-24 DIAGNOSIS — I10 Essential (primary) hypertension: Secondary | ICD-10-CM | POA: Diagnosis not present

## 2020-09-24 DIAGNOSIS — G9341 Metabolic encephalopathy: Secondary | ICD-10-CM | POA: Diagnosis not present

## 2020-09-24 DIAGNOSIS — I639 Cerebral infarction, unspecified: Secondary | ICD-10-CM | POA: Diagnosis not present

## 2020-09-24 LAB — CBC
HCT: 35 % — ABNORMAL LOW (ref 36.0–46.0)
Hemoglobin: 11.7 g/dL — ABNORMAL LOW (ref 12.0–15.0)
MCH: 31.5 pg (ref 26.0–34.0)
MCHC: 33.4 g/dL (ref 30.0–36.0)
MCV: 94.1 fL (ref 80.0–100.0)
Platelets: 274 10*3/uL (ref 150–400)
RBC: 3.72 MIL/uL — ABNORMAL LOW (ref 3.87–5.11)
RDW: 12.7 % (ref 11.5–15.5)
WBC: 7.2 10*3/uL (ref 4.0–10.5)
nRBC: 0 % (ref 0.0–0.2)

## 2020-09-24 LAB — PROCALCITONIN: Procalcitonin: 0.18 ng/mL

## 2020-09-24 LAB — BASIC METABOLIC PANEL
Anion gap: 7 (ref 5–15)
BUN: 11 mg/dL (ref 8–23)
CO2: 27 mmol/L (ref 22–32)
Calcium: 8.8 mg/dL — ABNORMAL LOW (ref 8.9–10.3)
Chloride: 102 mmol/L (ref 98–111)
Creatinine, Ser: 0.62 mg/dL (ref 0.44–1.00)
GFR, Estimated: >60 mL/min (ref >60)
Glucose, Bld: 137 mg/dL — ABNORMAL HIGH (ref 70–99)
Potassium: 3.7 mmol/L (ref 3.5–5.1)
Sodium: 136 mmol/L (ref 135–145)

## 2020-09-24 MED ORDER — LOSARTAN POTASSIUM 50 MG PO TABS
25.0000 mg | ORAL_TABLET | Freq: Every day | ORAL | Status: DC
  Administered 2020-09-24 – 2020-10-09 (×11): 25 mg
  Filled 2020-09-24 (×12): qty 1

## 2020-09-24 MED ORDER — THIAMINE HCL 100 MG PO TABS
100.0000 mg | ORAL_TABLET | Freq: Every day | ORAL | Status: DC
  Administered 2020-09-24 – 2020-10-08 (×10): 100 mg
  Filled 2020-09-24 (×11): qty 1

## 2020-09-24 MED ORDER — CLOPIDOGREL BISULFATE 75 MG PO TABS
75.0000 mg | ORAL_TABLET | Freq: Every day | ORAL | Status: DC
Start: 2020-09-24 — End: 2022-10-05

## 2020-09-24 MED ORDER — FREE WATER: 200.0000 mL | Freq: Three times a day (TID) | Status: DC

## 2020-09-24 MED ORDER — ASPIRIN 81 MG PO CHEW: 81.0000 mg | CHEWABLE_TABLET | Freq: Every day | ORAL | Status: DC

## 2020-09-24 MED ORDER — AMLODIPINE BESYLATE 5 MG PO TABS: 5.0000 mg | ORAL_TABLET | Freq: Every day | ORAL | Status: DC

## 2020-09-24 MED ORDER — ATORVASTATIN CALCIUM 80 MG PO TABS: 80.0000 mg | ORAL_TABLET | Freq: Every day | ORAL | Status: DC

## 2020-09-24 MED ORDER — PNEUMOCOCCAL VAC POLYVALENT 25 MCG/0.5ML IJ INJ
0.5000 mL | INJECTION | INTRAMUSCULAR | Status: AC
  Administered 2020-10-09: 0.5 mL via INTRAMUSCULAR
  Filled 2020-09-24 (×2): qty 0.5

## 2020-09-24 MED ORDER — THIAMINE HCL 100 MG PO TABS
100.0000 mg | ORAL_TABLET | Freq: Every day | ORAL | Status: DC
Start: 2020-09-24 — End: 2022-10-09

## 2020-09-24 MED ORDER — BACITRACIN-NEOMYCIN-POLYMYXIN OINTMENT TUBE
TOPICAL_OINTMENT | Freq: Every day | CUTANEOUS | Status: AC
  Administered 2020-09-27: 1 via TOPICAL
  Filled 2020-09-24: qty 14.17

## 2020-09-24 MED ORDER — LOSARTAN POTASSIUM 25 MG PO TABS: 25.0000 mg | ORAL_TABLET | Freq: Every day | ORAL | Status: DC

## 2020-09-24 MED ORDER — HYDRALAZINE HCL 10 MG PO TABS: 10.0000 mg | ORAL_TABLET | Freq: Three times a day (TID) | ORAL | Status: DC

## 2020-09-24 MED ORDER — OSMOLITE 1.2 CAL PO LIQD
ORAL | 0 refills | Status: DC
Start: 2020-09-24 — End: 2022-10-05

## 2020-09-24 NOTE — Progress Notes (Signed)
Pt asked to get OOB to chair. Pt assisted x1 to sit on side of bed, good balance but no use of right arm/hand. Pt able to stand with assist x1, good balance, took 3 steps to chair and sat with only standby assist. Pt states, "That feels so much better! I have got to get strong so I can get back home". Voice still soft and speech slurred but understandable.  Pt continues with congested cough and drooling. Pt with oral suction catheter within reach and able to use appropriately. Pt able to brush teeth by herself once toothpaste placed on brush. States can't brush hard because her teeth and gums are really sore. Oral cavity pink, moist, with no ulcers noted. Pt still has dried, peeling skin on lips, moisturizer applied. Pt able to take water from cup, swish and spit to rinse mouth.  Purewick intact to suction. Tube feeding continues. Denies any c/o pain or SOB, VSS. Pt remains in chair, watching TV, call bell in reach.

## 2020-09-24 NOTE — Progress Notes (Signed)
Called by Tele with report of pt having 12 beat run of SVT, with pulse of 74 at approximately 2233. Upon review it was not actual SVT, tachycardia was present. Pt  BP 152/70 no s/s of cardiac distress or Stroke. Dr.  Thomes Dinning made aware and was told to continue to monitor.  Skeet Simmer.

## 2020-09-24 NOTE — Discharge Summary (Signed)
Physician Discharge Summary  Keaghan Bowens Arbaugh EQA:[id number] DOB: 03-07-1956 DOA: 09/17/2020  PCP: Pcp, No  Admit date: 09/17/2020 Discharge date: 09/24/2020  Admitted From: Home Disposition:  SNF  Recommendations for Outpatient Follow-up:  Follow up with PCP in 1-2 weeks Please obtain BMP/CBC in one week Please remove ZIO patch (heart monitor) on 10/05/20 and mail back in pre-packaged envelope which patient's son has in possession   Discharge Condition: Stable CODE STATUS: FULL Diet recommendation: Osmolite 1.2 @ 55 cc/hr and free water 200 cc TID   Brief/Interim Summary: 65 y.o. female, with no known medical history, who presents to the ED with altered mental status. History is quite limited 2/2 patient's acute encephalopathy. Earlier, family was able to provide some history to the ED provider. They reported that the patient's last known normal was 6/24. She has been somnolent, and not taking care of herself in that interval. Family was not aware of an EtOH or illicit drug use. They are not aware of an trauma, and there are not signs of trauma on exam. Family reported no infectious symptoms or fevers.  Work up revealed patient suffered bilateral thalamic infarcts that have contributed to her encephalopathy.  After a goals of care discussion, patient's son wanted full scope of care.  Patient failed swallow eval and PEG tube was placed 09/22/20  ED: Vitals stable, encephalopathic, CT head shows focal regions of hypoattenuation seen in the genu of the right internal capsule, posterior limb of the left internal capsule and right thalamus which could reflect age-indeterminate infarction.  No other acute abnormality    Discharge Diagnoses:  Acute ischemic stroke/Acute metabolic Encephalopathy -case discussed with neurology, Dr. Eligah East most likely due to stroke -initially patient was very somnolent only waking up to protopathic stimuli-->slow/gradual improvement near end of  hospitalization -MR brain--scattered bilateral infarcts of thalamus -carotid US--R-ICA--50-69%; L-ICA <50% -MRA brain--no LVO -Echo (TTE)--EF 60-65%, no WMA, no PFO -A1C--5.5 -LDL--138 -continue ASA and plavix -discussed with cardiology--not a good candidate for TEE nor loop recorder -TSH--1.927 -ammonia 15>>26 -B12--262 -folate 4.8 -UA--no pyuria -09/23/20--more awake, occasionally following commands -09/24/20--able to answer some questions appropriately and following commands   Hyperlipidemia -continue statin    Dysphagia -speech eval-->not safe for po intake -IR placed PEG 09/22/20 -started enteral feeding 7/2 -tolerating Osmolite 1.2 @ 55cc/hr -Free Water 200 cc every 8 hours   Hypertension -allowing permissive hypertension initially -start IV hydralazine prn -started amlodipine when able to use PEG -started losartan and hydralazine per PEG   Hypokalemia -replete -mag 2.1  Tobacco abuse -she was smoking up to 2ppd prior to admssion -cessation discussed   Discharge Instructions   Allergies as of 09/24/2020   No Known Allergies      Medication List     TAKE these medications    amLODipine 5 MG tablet Commonly known as: NORVASC Place 1 tablet (5 mg total) into feeding tube daily.   aspirin 81 MG chewable tablet Place 1 tablet (81 mg total) into feeding tube daily.   atorvastatin 80 MG tablet Commonly known as: LIPITOR Place 1 tablet (80 mg total) into feeding tube daily.   clopidogrel 75 MG tablet Commonly known as: PLAVIX Place 1 tablet (75 mg total) into feeding tube daily.   feeding supplement (OSMOLITE 1.2 CAL) Liqd Infuse at 55 cc/hour via pump   free water Soln Place 200 mLs into feeding tube every 8 (eight) hours.   hydrALAZINE 10 MG tablet Commonly known as: APRESOLINE Take 1 tablet (10 mg total) by mouth  every 8 (eight) hours.   losartan 25 MG tablet Commonly known as: COZAAR Place 1 tablet (25 mg total) into feeding tube daily.    thiamine 100 MG tablet Place 1 tablet (100 mg total) into feeding tube daily.        Contact information for after-discharge care     Destination     [facility] .   Service: Skilled Nursing Contact information: [address] [place] [address] [phone]                    No Known Allergies  Consultations: neurology   Procedures/Studies: CT ABDOMEN WO CONTRAST  Result Date: 09/21/2020 CLINICAL DATA:  Evaluate anatomy prior to potential percutaneous gastrostomy tube placement. EXAM: CT ABDOMEN WITHOUT CONTRAST TECHNIQUE: Multidetector CT imaging of the abdomen was performed following the standard protocol without IV contrast. COMPARISON:  None. FINDINGS: The lack of intravenous contrast limits the ability to evaluate solid abdominal organs. Lower chest: Limited visualization of the lower thorax demonstrates minimal dependent subpleural atelectasis. Normal heart size. No pericardial effusion. Hepatobiliary: Normal hepatic contour. Normal noncontrast appearance of the gallbladder given degree distention. No radiopaque gallstones. There is a trace amount of perihepatic ascites. Pancreas: Normal noncontrast appearance of the pancreas. Spleen: Normal noncontrast appearance of the spleen. 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 Normal noncontrast appearance of the right kidney. No renal stones. No right-sided urinary obstruction. Normal noncontrast appearance the bilateral adrenal glands. The urinary bladder was not imaged. Stomach/Bowel: The anterior wall the stomach is well apposed against the ventral wall of the abdomen without interposition of the hepatic parenchyma or the transverse colon. Moderate colonic stool burden without evidence of enteric obstruction. No pneumoperitoneum, pneumatosis or portal venous gas. Vascular/Lymphatic:  Large amount of irregular calcified atherosclerotic plaque within a bilobed ectatic abdominal aorta with dominant caudal component measuring 2.6 cm and maximal oblique short axis coronal diameter (coronal image 33, series 5). No bulky retroperitoneal or mesenteric lymphadenopathy. Other: Regional soft tissues appear normal. Musculoskeletal: No acute or aggressive osseous abnormalities. Mild DDD of L4-L5 with disc space height loss, endplate irregularity and sclerosis. IMPRESSION: 1. Gastric anatomy amenable to potential percutaneous gastrostomy tube placement as indicated. 2. 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 3. Mildly ectatic abdominal aorta measuring 2.6 cm in maximal diameter. Recommend follow-up aortic ultrasound in 5 years. This recommendation follows ACR consensus guidelines: White Paper of the ACR Incidental Findings Committee II on Vascular Findings. J Am Coll Radiol 2013; 10:789-794. 4. Aortic aneurysm NOS (ICD10-I71.9). Electronically Signed   By: Simonne Come M.D.   On: 09/21/2020 09:36   CT Head Wo Contrast  Result Date: 09/17/2020 CLINICAL DATA:  Mental status change, found this afternoon on couch EXAM: CT HEAD WITHOUT CONTRAST TECHNIQUE: Contiguous axial images were obtained from the base of the skull through the vertex without intravenous contrast. COMPARISON:  None. FINDINGS: Brain: Foci of hypoattenuation present in the right internal capsule as as a separate focus in the right caudate and posterior limb of the left internal capsule could reflect sequela of age-indeterminate lacunar type infarcts difficult to fully assess given a background of more diffuse patchy white matter hypoattenuation typically indicative of microvascular angiopathy in a patient of this age. Additional hypoattenuation in the pons and brainstem is more nonspecific given streak artifact across skull base. Symmetric prominence of the ventricles,  cisterns and sulci compatible with parenchymal  volume loss. No hyperdense hemorrhage. No mass effect or midline shift. No extra-axial collection. Scattered benign dural calcifications. Vascular: Atherosclerotic calcification of the carotid siphons. No hyperdense vessel. Skull: No calvarial fracture or suspicious osseous lesion. No scalp swelling or hematoma. Sinuses/Orbits: Paranasal sinuses and mastoid air cells are predominantly clear. Other: Included orbital structures are unremarkable. 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 Could be further characterized with MR imaging as warranted. No other acute intracranial abnormality. These results were called by telephone at the time of interpretation on 09/17/2020 at 11:45 pm to provider Benjiman Core , who verbally acknowledged these results. Electronically Signed   By: Kreg Shropshire M.D.   On: 09/17/2020 23:45   MR ANGIO HEAD WO CONTRAST  Result Date: 09/18/2020 CLINICAL DATA:  Altered mental status. EXAM: MRI HEAD WITHOUT CONTRAST MRA HEAD WITHOUT CONTRAST TECHNIQUE: Multiplanar, multi-echo pulse sequences of the brain and surrounding structures were acquired without intravenous contrast. Angiographic images of the Circle of Willis were acquired using MRA technique without intravenous contrast. COMPARISON:  No pertinent prior exam. FINDINGS: MRI HEAD FINDINGS Brain: Restricted diffusion in the bilateral thalamus, bilateral internal capsule, left centrum semiovale, and right subcortical frontal. These have the appearance of acute infarcts. Mild small vessel ischemic type change in the pons and hemispheric white matter. No evidence of hemorrhage, hydrocephalus, or collection. Vascular:  Preserved flow voids. Skull and upper cervical spine: Normal marrow signal Sinuses/Orbits: Negative Other: Significant and progressive motion artifact MRA HEAD FINDINGS Very motion degraded. Carotid, vertebral, and basilar arteries and major branches are patent with gross symmetry. IMPRESSION: Brain MRI: 1. 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 These are not in a unified arterial or venous distribution and are of uncertain cause. No revealing background chronic brain findings. 2. Significantly motion degraded Intracranial MRA: Very motion degraded and of limited utility other than documenting patency of major vessels. Electronically Signed   By: Marnee Spring M.D.   On: 09/18/2020 08:43   MR BRAIN WO CONTRAST  Result Date: 09/18/2020 CLINICAL DATA:  Altered mental status. EXAM: MRI HEAD WITHOUT CONTRAST MRA HEAD WITHOUT CONTRAST TECHNIQUE: Multiplanar, multi-echo pulse sequences of the brain and surrounding structures were acquired without intravenous contrast. Angiographic images of the Circle of Willis were acquired using MRA technique without intravenous contrast. COMPARISON:  No pertinent prior exam. FINDINGS: MRI HEAD FINDINGS Brain: Restricted diffusion in the bilateral thalamus, bilateral internal capsule, left centrum semiovale, and right subcortical frontal. These have the appearance of acute infarcts. Mild small vessel ischemic type change in the pons and hemispheric white matter. No evidence of hemorrhage, hydrocephalus, or collection. Vascular: Preserved flow voids. Skull and upper cervical spine: Normal marrow signal Sinuses/Orbits: Negative Other: Significant and progressive motion artifact MRA HEAD FINDINGS Very motion degraded. Carotid, vertebral, and basilar arteries and major branches are patent with gross symmetry. IMPRESSION: Brain MRI: 1. Scattered small vessel infarcts in the bilateral thalamus,  bilateral internal capsule, and bilateral hemispheric white matter. These are not in a unified arterial or venous distribution and are of uncertain cause. No revealing background chronic brain findings. 2. Significantly motion degraded Intracranial MRA: Very motion degraded and of limited utility other than documenting patency of major vessels. Electronically Signed   By: Marja Kays  Watts M.D.   On: 09/18/2020 08:43   IR GASTROSTOMY TUBE MOD SED  Result Date: 09/22/2020 INDICATION: 65 year old with altered mental status and dysphagia. Request for gastrostomy tube placement. EXAM: PERCUTANEOUS GASTROSTOMY TUBE WITH FLUOROSCOPIC GUIDANCE Physician: Rachelle Hora. Lowella Dandy, MD MEDICATIONS: Ancef 2 g; Antibiotics were administered within 1 hour of the procedure. Glucagon 0.5 mg ANESTHESIA/SEDATION: Versed 2.0 mg IV; Fentanyl 100 mcg IV Moderate Sedation Time:  42 minutes The patient was continuously monitored during the procedure by the interventional radiology nurse under my direct supervision. FLUOROSCOPY TIME:  Fluoroscopy Time: 12 minutes, 6 seconds, 19 mGy CONTRAST:  20 mL Omnipaque 300 COMPLICATIONS: None immediate. PROCEDURE: Informed consent was obtained for a percutaneous gastrostomy tube. The patient was placed on the interventional table. An orogastric tube was placed with fluoroscopic guidance. The anterior abdomen was prepped and draped in sterile fashion. Maximal barrier sterile technique was utilized including caps, mask, sterile gowns, sterile gloves, sterile drape, hand hygiene and skin antiseptic. Stomach was inflated with air through the orogastric tube. The skin and subcutaneous tissues were anesthetized with 1% lidocaine. A 17 gauge needle was directed into the distended stomach with fluoroscopic guidance. A wire was advanced into the stomach and a T fastener was deployed. A 9-French vascular sheath was placed and the orogastric tube was snared using a Gooseneck snare device. At this time, the patient was  clenching her teeth and we could not open her mouth in order to place the pull-through gastrostomy tube. As a result, we converted to a push through gastrostomy tube. Skin around the 9 French drain was anesthetized with 1% lidocaine. Using fluoroscopic guidance, 2 additional T-fasteners were deployed. The T fastener associated with the 9 French sheath was cut. The 9 French sheath was removed and dilated up to an 18 [place] peel-away sheath. A 16 French Entuit gastrostomy tube was advanced over the wire and through the peel-away sheath. The balloon was inflated with approximately 8 mL of saline. Peel-away sheath and wire were removed. Contrast injection confirmed placement in the stomach. Fluoroscopic images were obtained for documentation. The gastrostomy tube was flushed with normal saline. FINDINGS: 11 French balloon retention gastrostomy tube is positioned in the stomach. Total of 3 T-fasteners were deployed and there are still 2 T-fasteners intact. Contrast injection confirmed placement in the stomach. IMPRESSION: 1. Successful placement of a percutaneous gastrostomy tube using fluoroscopy. This is a balloon retention gastrostomy tube. 2. There are 2 remaining T-fasteners in place. Plan to cut the T-fasteners in 7-10 days. Electronically Signed   By: Richarda Overlie M.D.   On: 09/22/2020 12:07   US Carotid Bilateral (at [facility] and [facility] only)  Result Date: 09/18/2020 CLINICAL DATA:  Altered mental status Hypertension Stroke EXAM: BILATERAL CAROTID DUPLEX ULTRASOUND TECHNIQUE: Wallace Cullens scale imaging, color Doppler and duplex ultrasound were performed of bilateral carotid and vertebral arteries in the neck. COMPARISON:  None. FINDINGS: Criteria: Quantification of carotid stenosis is based on velocity parameters that correlate the residual internal carotid diameter with NASCET-based stenosis levels, using the diameter of the distal internal carotid lumen as the denominator for stenosis measurement. The following velocity  measurements were obtained: RIGHT ICA: 156/29 cm/sec CCA: 77/11 cm/sec SYSTOLIC ICA/CCA RATIO:  2.0 ECA: 173 cm/sec LEFT ICA: 83/18 cm/sec CCA: 85/14 cm/sec SYSTOLIC ICA/CCA RATIO:  1.0 ECA: 92 cm/sec RIGHT CAROTID ARTERY: Heterogeneous calcified and noncalcified plaque of the distal common and proximal internal carotid arteries with velocity indicative of 50-69% stenosis. RIGHT VERTEBRAL ARTERY:  Antegrade flow. LEFT CAROTID ARTERY: Heterogeneous  calcified and noncalcified plaque of the distal common and proximal internal carotid arteries with velocity parameters indicative of less than 50% stenosis. LEFT VERTEBRAL ARTERY:  Antegrade flow. IMPRESSION: 1. 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 2. Less than 50% stenosis of the left internal carotid artery. 3. Long segment shadowing plaque present in both common and internal carotid arteries, which can obscure higher velocities. Further evaluation with MRI or CT of the neck should be performed for more precise evaluation of degree of stenosis. Electronically Signed   By: Acquanetta Belling M.D.   On: 09/18/2020 10:01   DG Chest Portable 1 View  Result Date: 09/17/2020 CLINICAL DATA:  Altered mental status EXAM: PORTABLE CHEST 1 VIEW COMPARISON:  None FINDINGS: There is hyperinflation of the lungs compatible with COPD. Heart and mediastinal contours are within normal limits. No focal opacities or effusions. No acute bony abnormality. IMPRESSION: COPD.  No active disease. Electronically Signed   By: Charlett Nose M.D.   On: 09/17/2020 23:02   ECHOCARDIOGRAM COMPLETE  Result Date: 09/18/2020    ECHOCARDIOGRAM REPORT   Patient Name:   [facility] Date of Exam: 09/18/2020 Medical Rec #:  [id number]       Height: Accession #:    [id number]      Weight: Date of Birth:  June 12, 1955       BSA: Patient Age:    64 years        BP:           213/100 mmHg Patient Gender: F               HR:           59 bpm. [facility] Procedure: 2D Echo, Cardiac  Doppler and Color Doppler Indications:    Stroke  History:        Patient has no prior history of Echocardiogram examinations.                 Stroke; Risk Factors:Hypertension. Height and Weight not                 available.  Sonographer:    Mikki Harbor Referring Phys: [id number] ASIA B ZIERLE-GHOSH  Sonographer Comments: Height and Weight not available IMPRESSIONS  1. Left ventricular ejection fraction, by estimation, is 60 to 65%. The left ventricle has normal function. The left ventricle has no regional wall motion abnormalities. There is mild left ventricular hypertrophy. Left ventricular diastolic parameters were normal.  2. Right ventricular systolic function is normal. The right ventricular size is normal. Tricuspid regurgitation signal is inadequate for assessing PA pressure.  3. The mitral valve is grossly normal. Trivial mitral valve regurgitation.  4. The aortic valve is tricuspid. Aortic valve regurgitation is not visualized. Mild aortic valve sclerosis is present, with no evidence of aortic valve stenosis. Aortic valve mean gradient measures 3.0 mmHg.  5. The inferior vena cava is normal in size with greater than 50% respiratory variability, suggesting right atrial pressure of 3 mmHg. FINDINGS  Left Ventricle: Left ventricular ejection fraction, by estimation, is 60 to 65%. The left ventricle has normal function. The left ventricle has no regional wall motion abnormalities. The left ventricular internal cavity size was normal in size. There is  mild left ventricular hypertrophy. Left ventricular diastolic parameters were normal. Right Ventricle: The right ventricular size is normal. No increase in right ventricular wall thickness. Right ventricular systolic function is normal. Tricuspid regurgitation signal is inadequate for assessing  PA pressure. Left Atrium: Left atrial size was normal in size. Right Atrium: Right atrial size was normal in size. Pericardium: There is no evidence of pericardial  effusion. Presence of pericardial fat pad. Mitral Valve: The mitral valve is grossly normal. Mild mitral annular calcification. Trivial mitral valve regurgitation. MV peak gradient, 2.7 mmHg. The mean mitral valve gradient is 1.0 mmHg. Tricuspid Valve: The tricuspid valve is grossly normal. Tricuspid valve regurgitation is trivial. Aortic Valve: The aortic valve is tricuspid. There is mild aortic valve annular calcification. Aortic valve regurgitation is not visualized. Mild aortic valve sclerosis is present, with no evidence of aortic valve stenosis. Aortic valve mean gradient measures 3.0 mmHg. Aortic valve peak gradient measures 5.7 mmHg. Aortic valve area, by VTI measures 2.71 cm. Pulmonic Valve: The pulmonic valve was grossly normal. Pulmonic valve regurgitation is trivial. Aorta: The aortic root is normal in size and structure. Venous: The inferior vena cava is normal in size with greater than 50% respiratory variability, suggesting right atrial pressure of 3 mmHg. IAS/Shunts: The interatrial septum appears to be lipomatous. No atrial level shunt detected by color flow Doppler.  LEFT VENTRICLE PLAX 2D LVIDd:         4.38 cm  Diastology LVIDs:         2.91 cm  LV e' medial:    7.10 cm/s LV PW:         1.04 cm  LV E/e' medial:  11.9 LV IVS:        1.06 cm  LV e' lateral:   7.62 cm/s LVOT diam:     2.00 cm  LV E/e' lateral: 11.0 LV SV:         90 LVOT Area:     3.14 cm  RIGHT VENTRICLE RV Basal diam:  3.37 cm RV Mid diam:    3.06 cm RV S prime:     10.20 cm/s TAPSE (M-mode): 2.3 cm LEFT ATRIUM             RIGHT ATRIUM LA diam:        3.00 cm RA Area:     13.30 cm LA Vol (A2C):   49.5 ml RA Volume:   31.30 ml LA Vol (A4C):   28.7 ml LA Biplane Vol: 40.5 ml  AORTIC VALVE AV Area (Vmax):    2.85 cm AV Area (Vmean):   2.55 cm AV Area (VTI):     2.71 cm AV Vmax:           119.00 cm/s AV Vmean:          86.100 cm/s AV VTI:            0.330 m AV Peak Grad:      5.7 mmHg AV Mean Grad:      3.0 mmHg LVOT Vmax:          108.00 cm/s LVOT Vmean:        69.800 cm/s LVOT VTI:          0.285 m LVOT/AV VTI ratio: 0.86  AORTA Ao Root diam: 3.30 cm MITRAL VALVE MV Area (PHT): 3.15 cm    SHUNTS MV Area VTI:   2.54 cm    Systemic VTI:  0.29 m MV Peak grad:  2.7 mmHg    Systemic Diam: 2.00 cm MV Mean grad:  1.0 mmHg MV Vmax:       0.81 m/s MV Vmean:      41.2 cm/s MV Decel Time: 241 msec MV E velocity: 84.20  cm/s MV A velocity: 72.70 cm/s MV E/A ratio:  1.16 Nona DellSamuel Mcdowell MD Electronically signed by Nona DellSamuel Mcdowell MD Signature Date/Time: 09/18/2020/2:03:27 PM    Final         Discharge Exam: Vitals:   09/24/20 0506 09/24/20 0800  BP: (!) 170/66 (!) 174/68  Pulse: 66 67  Resp: 18 20  Temp: 98.5 F (36.9 C) 98.6 F (37 C)  SpO2: 98% 96%   Vitals:   09/23/20 1600 09/23/20 2107 09/24/20 0506 09/24/20 0800  BP: (!) 198/55 (!) 189/73 (!) 170/66 (!) 174/68  Pulse: 63 70 66 67  Resp: 18 19 18 20   Temp: 98.3 F (36.8 C) 98.1 F (36.7 C) 98.5 F (36.9 C) 98.6 F (37 C)  TempSrc: Axillary Oral Oral Oral  SpO2: 95% 98% 98% 96%  Weight:      Height:        General: Pt is alert, awake, not in acute distress Cardiovascular: RRR, S1/S2 +, no rubs, no gallops Respiratory: diminished BS,  bibasilar rales. No wheeze Abdominal: Soft, NT, ND, bowel sounds + Extremities: no edema, no cyanosis   The results of significant diagnostics from this hospitalization (including imaging, microbiology, ancillary and laboratory) are listed below for reference.    Significant Diagnostic Studies: CT ABDOMEN WO CONTRAST  Result Date: 09/21/2020 CLINICAL DATA:  Evaluate anatomy prior to potential percutaneous gastrostomy tube placement. EXAM: CT ABDOMEN WITHOUT CONTRAST TECHNIQUE: Multidetector CT imaging of the abdomen was performed following the standard protocol without IV contrast. COMPARISON:  None. FINDINGS: The lack of intravenous contrast limits the ability to evaluate solid abdominal organs. Lower chest: Limited  visualization of the lower thorax demonstrates minimal dependent subpleural atelectasis. Normal heart size. No pericardial effusion. Hepatobiliary: Normal hepatic contour. Normal noncontrast appearance of the gallbladder given degree distention. No radiopaque gallstones. There is a trace amount of perihepatic ascites. Pancreas: Normal noncontrast appearance of the pancreas. Spleen: Normal noncontrast appearance of the spleen. 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 Normal noncontrast appearance of the right kidney. No renal stones. No right-sided urinary obstruction. Normal noncontrast appearance the bilateral adrenal glands. The urinary bladder was not imaged. Stomach/Bowel: The anterior wall the stomach is well apposed against the ventral wall of the abdomen without interposition of the hepatic parenchyma or the transverse colon. Moderate colonic stool burden without evidence of enteric obstruction. No pneumoperitoneum, pneumatosis or portal venous gas. Vascular/Lymphatic: Large amount of irregular calcified atherosclerotic plaque within a bilobed ectatic abdominal aorta with dominant caudal component measuring 2.6 cm and maximal oblique short axis coronal diameter (coronal image 33, series 5). No bulky retroperitoneal or mesenteric lymphadenopathy. Other: Regional soft tissues appear normal. Musculoskeletal: No acute or aggressive osseous abnormalities. Mild DDD of L4-L5 with disc space height loss, endplate irregularity and sclerosis. IMPRESSION: 1. Gastric anatomy amenable to potential percutaneous gastrostomy tube placement as indicated. 2. 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 3. Mildly ectatic abdominal aorta measuring 2.6 cm in maximal diameter. Recommend follow-up aortic ultrasound in 5 years. This recommendation follows ACR consensus  guidelines: White Paper of the ACR Incidental Findings Committee II on Vascular Findings. J Am Coll Radiol 2013; 10:789-794. 4. Aortic aneurysm NOS (ICD10-I71.9). Electronically Signed   By: Simonne ComeJohn  Watts M.D.   On: 09/21/2020 09:36   CT Head Wo Contrast  Result Date: 09/17/2020 CLINICAL DATA:  Mental status change, found this afternoon on couch EXAM: CT HEAD WITHOUT CONTRAST TECHNIQUE: Contiguous axial  images were obtained from the base of the skull through the vertex without intravenous contrast. COMPARISON:  None. FINDINGS: Brain: Foci of hypoattenuation present in the right internal capsule as as a separate focus in the right caudate and posterior limb of the left internal capsule could reflect sequela of age-indeterminate lacunar type infarcts difficult to fully assess given a background of more diffuse patchy white matter hypoattenuation typically indicative of microvascular angiopathy in a patient of this age. Additional hypoattenuation in the pons and brainstem is more nonspecific given streak artifact across skull base. Symmetric prominence of the ventricles, cisterns and sulci compatible with parenchymal volume loss. No hyperdense hemorrhage. No mass effect or midline shift. No extra-axial collection. Scattered benign dural calcifications. Vascular: Atherosclerotic calcification of the carotid siphons. No hyperdense vessel. Skull: No calvarial fracture or suspicious osseous lesion. No scalp swelling or hematoma. Sinuses/Orbits: Paranasal sinuses and mastoid air cells are predominantly clear. Other: Included orbital structures are unremarkable. 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 Could be further characterized with MR imaging as  warranted. No other acute intracranial abnormality. These results were called by telephone at the time of interpretation on 09/17/2020 at 11:45 pm to provider Benjiman Core , who verbally acknowledged these results. Electronically Signed   By: Kreg Shropshire M.D.   On: 09/17/2020 23:45   MR ANGIO HEAD WO CONTRAST  Result Date: 09/18/2020 CLINICAL DATA:  Altered mental status. EXAM: MRI HEAD WITHOUT CONTRAST MRA HEAD WITHOUT CONTRAST TECHNIQUE: Multiplanar, multi-echo pulse sequences of the brain and surrounding structures were acquired without intravenous contrast. Angiographic images of the Circle of Willis were acquired using MRA technique without intravenous contrast. COMPARISON:  No pertinent prior exam. FINDINGS: MRI HEAD FINDINGS Brain: Restricted diffusion in the bilateral thalamus, bilateral internal capsule, left centrum semiovale, and right subcortical frontal. These have the appearance of acute infarcts. Mild small vessel ischemic type change in the pons and hemispheric white matter. No evidence of hemorrhage, hydrocephalus, or collection. Vascular: Preserved flow voids. Skull and upper cervical spine: Normal marrow signal Sinuses/Orbits: Negative Other: Significant and progressive motion artifact MRA HEAD FINDINGS Very motion degraded. Carotid, vertebral, and basilar arteries and major branches are patent with gross symmetry. IMPRESSION: Brain MRI: 1. 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 These are not in a unified arterial or venous distribution and are of uncertain cause. No revealing background chronic brain findings. 2. Significantly motion degraded Intracranial MRA: Very motion degraded and of limited utility other than documenting patency of major vessels. Electronically Signed   By: Marnee Spring M.D.   On: 09/18/2020 08:43   MR BRAIN WO CONTRAST  Result Date: 09/18/2020 CLINICAL DATA:  Altered  mental status. EXAM: MRI HEAD WITHOUT CONTRAST MRA HEAD WITHOUT CONTRAST TECHNIQUE: Multiplanar, multi-echo pulse sequences of the brain and surrounding structures were acquired without intravenous contrast. Angiographic images of the Circle of Willis were acquired using MRA technique without intravenous contrast. COMPARISON:  No pertinent prior exam. FINDINGS: MRI HEAD FINDINGS Brain: Restricted diffusion in the bilateral thalamus, bilateral internal capsule, left centrum semiovale, and right subcortical frontal. These have the appearance of acute infarcts. Mild small vessel ischemic type change in the pons and hemispheric white matter. No evidence of hemorrhage, hydrocephalus, or collection. Vascular:  Preserved flow voids. Skull and upper cervical spine: Normal marrow signal Sinuses/Orbits: Negative Other: Significant and progressive motion artifact MRA HEAD FINDINGS Very motion degraded. Carotid, vertebral, and basilar arteries and major branches are patent with gross symmetry. IMPRESSION: Brain MRI: 1. 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 These are not in a unified arterial or venous distribution and are of uncertain cause. No revealing background chronic brain findings. 2. Significantly motion degraded Intracranial MRA: Very motion degraded and of limited utility other than documenting patency of major vessels. Electronically Signed   By: Marnee Spring M.D.   On: 09/18/2020 08:43   IR GASTROSTOMY TUBE MOD SED  Result Date: 09/22/2020 INDICATION: 65 year old with altered mental status and dysphagia. Request for gastrostomy tube placement. EXAM: PERCUTANEOUS GASTROSTOMY TUBE WITH FLUOROSCOPIC GUIDANCE Physician: Rachelle Hora. Lowella Dandy, MD MEDICATIONS: Ancef 2 g; Antibiotics were administered within 1 hour of the procedure. Glucagon 0.5 mg ANESTHESIA/SEDATION: Versed 2.0 mg IV; Fentanyl 100 mcg IV Moderate Sedation Time:  42  minutes The patient was continuously monitored during the procedure by the interventional radiology nurse under my direct supervision. FLUOROSCOPY TIME:  Fluoroscopy Time: 12 minutes, 6 seconds, 19 mGy CONTRAST:  20 mL Omnipaque 300 COMPLICATIONS: None immediate. PROCEDURE: Informed consent was obtained for a percutaneous gastrostomy tube. The patient was placed on the interventional table. An orogastric tube was placed with fluoroscopic guidance. The anterior abdomen was prepped and draped in sterile fashion. Maximal barrier sterile technique was utilized including caps, mask, sterile gowns, sterile gloves, sterile drape, hand hygiene and skin antiseptic. Stomach was inflated with air through the orogastric tube. The skin and subcutaneous tissues were anesthetized with 1% lidocaine. A 17 gauge needle was directed into the distended stomach with fluoroscopic guidance. A wire was advanced into the stomach and a T fastener was deployed. A 9-French vascular sheath was placed and the orogastric tube was snared using a Gooseneck snare device. At this time, the patient was clenching her teeth and we could not open her mouth in order to place the pull-through gastrostomy tube. As a result, we converted to a push through gastrostomy tube. Skin around the 9 French drain was anesthetized with 1% lidocaine. Using fluoroscopic guidance, 2 additional T-fasteners were deployed. The T fastener associated with the 9 French sheath was cut. The 9 French sheath was removed and dilated up to an 18 [place] peel-away sheath. A 16 French Entuit gastrostomy tube was advanced over the wire and through the peel-away sheath. The balloon was inflated with approximately 8 mL of saline. Peel-away sheath and wire were removed. Contrast injection confirmed placement in the stomach. Fluoroscopic images were obtained for documentation. The gastrostomy tube was flushed with normal saline. FINDINGS: 49 French balloon retention gastrostomy tube is  positioned in the stomach. Total of 3 T-fasteners were deployed and there are still 2 T-fasteners intact. Contrast injection confirmed placement in the stomach. IMPRESSION: 1. Successful placement of a percutaneous gastrostomy tube using fluoroscopy. This is a balloon retention gastrostomy tube. 2. There are 2 remaining T-fasteners in place. Plan to cut the T-fasteners in 7-10 days. Electronically Signed   By: Richarda Overlie M.D.   On: 09/22/2020 12:07   US Carotid Bilateral (at [facility] and [facility] only)  Result Date: 09/18/2020 CLINICAL DATA:  Altered mental status Hypertension Stroke EXAM: BILATERAL CAROTID DUPLEX ULTRASOUND TECHNIQUE: Wallace Cullens scale imaging, color Doppler and duplex ultrasound were performed of bilateral carotid and vertebral arteries in the neck. COMPARISON:  None.  FINDINGS: Criteria: Quantification of carotid stenosis is based on velocity parameters that correlate the residual internal carotid diameter with NASCET-based stenosis levels, using the diameter of the distal internal carotid lumen as the denominator for stenosis measurement. The following velocity measurements were obtained: RIGHT ICA: 156/29 cm/sec CCA: 77/11 cm/sec SYSTOLIC ICA/CCA RATIO:  2.0 ECA: 173 cm/sec LEFT ICA: 83/18 cm/sec CCA: 85/14 cm/sec SYSTOLIC ICA/CCA RATIO:  1.0 ECA: 92 cm/sec RIGHT CAROTID ARTERY: Heterogeneous calcified and noncalcified plaque of the distal common and proximal internal carotid arteries with velocity indicative of 50-69% stenosis. RIGHT VERTEBRAL ARTERY:  Antegrade flow. LEFT CAROTID ARTERY: Heterogeneous calcified and noncalcified plaque of the distal common and proximal internal carotid arteries with velocity parameters indicative of less than 50% stenosis. LEFT VERTEBRAL ARTERY:  Antegrade flow. IMPRESSION: 1. 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 2. Less than 50% stenosis of the left internal carotid artery. 3. Long segment shadowing plaque present in both common and internal carotid  arteries, which can obscure higher velocities. Further evaluation with MRI or CT of the neck should be performed for more precise evaluation of degree of stenosis. Electronically Signed   By: Acquanetta Belling M.D.   On: 09/18/2020 10:01   DG Chest Portable 1 View  Result Date: 09/17/2020 CLINICAL DATA:  Altered mental status EXAM: PORTABLE CHEST 1 VIEW COMPARISON:  None FINDINGS: There is hyperinflation of the lungs compatible with COPD. Heart and mediastinal contours are within normal limits. No focal opacities or effusions. No acute bony abnormality. IMPRESSION: COPD.  No active disease. Electronically Signed   By: Charlett Nose M.D.   On: 09/17/2020 23:02   ECHOCARDIOGRAM COMPLETE  Result Date: 09/18/2020    ECHOCARDIOGRAM REPORT   Patient Name:   [facility] Date of Exam: 09/18/2020 Medical Rec #:  [id number]       Height: Accession #:    [id number]      Weight: Date of Birth:  09/02/1955       BSA: Patient Age:    64 years        BP:           213/100 mmHg Patient Gender: F               HR:           59 bpm. [facility] Procedure: 2D Echo, Cardiac Doppler and Color Doppler Indications:    Stroke  History:        Patient has no prior history of Echocardiogram examinations.                 Stroke; Risk Factors:Hypertension. Height and Weight not                 available.  Sonographer:    Mikki Harbor Referring Phys: [id number] ASIA B ZIERLE-GHOSH  Sonographer Comments: Height and Weight not available IMPRESSIONS  1. Left ventricular ejection fraction, by estimation, is 60 to 65%. The left ventricle has normal function. The left ventricle has no regional wall motion abnormalities. There is mild left ventricular hypertrophy. Left ventricular diastolic parameters were normal.  2. Right ventricular systolic function is normal. The right ventricular size is normal. Tricuspid regurgitation signal is inadequate for assessing PA pressure.  3. The mitral valve is grossly normal. Trivial mitral valve  regurgitation.  4. The aortic valve is tricuspid. Aortic valve regurgitation is not visualized. Mild aortic valve sclerosis is present, with no evidence of aortic valve stenosis. Aortic valve mean  gradient measures 3.0 mmHg.  5. The inferior vena cava is normal in size with greater than 50% respiratory variability, suggesting right atrial pressure of 3 mmHg. FINDINGS  Left Ventricle: Left ventricular ejection fraction, by estimation, is 60 to 65%. The left ventricle has normal function. The left ventricle has no regional wall motion abnormalities. The left ventricular internal cavity size was normal in size. There is  mild left ventricular hypertrophy. Left ventricular diastolic parameters were normal. Right Ventricle: The right ventricular size is normal. No increase in right ventricular wall thickness. Right ventricular systolic function is normal. Tricuspid regurgitation signal is inadequate for assessing PA pressure. Left Atrium: Left atrial size was normal in size. Right Atrium: Right atrial size was normal in size. Pericardium: There is no evidence of pericardial effusion. Presence of pericardial fat pad. Mitral Valve: The mitral valve is grossly normal. Mild mitral annular calcification. Trivial mitral valve regurgitation. MV peak gradient, 2.7 mmHg. The mean mitral valve gradient is 1.0 mmHg. Tricuspid Valve: The tricuspid valve is grossly normal. Tricuspid valve regurgitation is trivial. Aortic Valve: The aortic valve is tricuspid. There is mild aortic valve annular calcification. Aortic valve regurgitation is not visualized. Mild aortic valve sclerosis is present, with no evidence of aortic valve stenosis. Aortic valve mean gradient measures 3.0 mmHg. Aortic valve peak gradient measures 5.7 mmHg. Aortic valve area, by VTI measures 2.71 cm. Pulmonic Valve: The pulmonic valve was grossly normal. Pulmonic valve regurgitation is trivial. Aorta: The aortic root is normal in size and structure. Venous: The  inferior vena cava is normal in size with greater than 50% respiratory variability, suggesting right atrial pressure of 3 mmHg. IAS/Shunts: The interatrial septum appears to be lipomatous. No atrial level shunt detected by color flow Doppler.  LEFT VENTRICLE PLAX 2D LVIDd:         4.38 cm  Diastology LVIDs:         2.91 cm  LV e' medial:    7.10 cm/s LV PW:         1.04 cm  LV E/e' medial:  11.9 LV IVS:        1.06 cm  LV e' lateral:   7.62 cm/s LVOT diam:     2.00 cm  LV E/e' lateral: 11.0 LV SV:         90 LVOT Area:     3.14 cm  RIGHT VENTRICLE RV Basal diam:  3.37 cm RV Mid diam:    3.06 cm RV S prime:     10.20 cm/s TAPSE (M-mode): 2.3 cm LEFT ATRIUM             RIGHT ATRIUM LA diam:        3.00 cm RA Area:     13.30 cm LA Vol (A2C):   49.5 ml RA Volume:   31.30 ml LA Vol (A4C):   28.7 ml LA Biplane Vol: 40.5 ml  AORTIC VALVE AV Area (Vmax):    2.85 cm AV Area (Vmean):   2.55 cm AV Area (VTI):     2.71 cm AV Vmax:           119.00 cm/s AV Vmean:          86.100 cm/s AV VTI:            0.330 m AV Peak Grad:      5.7 mmHg AV Mean Grad:      3.0 mmHg LVOT Vmax:         108.00 cm/s LVOT Vmean:  69.800 cm/s LVOT VTI:          0.285 m LVOT/AV VTI ratio: 0.86  AORTA Ao Root diam: 3.30 cm MITRAL VALVE MV Area (PHT): 3.15 cm    SHUNTS MV Area VTI:   2.54 cm    Systemic VTI:  0.29 m MV Peak grad:  2.7 mmHg    Systemic Diam: 2.00 cm MV Mean grad:  1.0 mmHg MV Vmax:       0.81 m/s MV Vmean:      41.2 cm/s MV Decel Time: 241 msec MV E velocity: 84.20 cm/s MV A velocity: 72.70 cm/s MV E/A ratio:  1.16 Nona Dell MD Electronically signed by Nona Dell MD Signature Date/Time: 09/18/2020/2:03:27 PM    Final     Microbiology: Recent Results (from the past 240 hour(s))  Culture, blood (routine x 2)     Status: None   Collection Time: 09/17/20 10:00 PM   Specimen: Right Antecubital; Blood  Result Value Ref Range Status   Specimen Description RIGHT ANTECUBITAL  Final   Special Requests   Final     BOTTLES DRAWN AEROBIC AND ANAEROBIC Blood Culture results may not be optimal due to an excessive volume of blood received in culture bottles   Culture   Final    NO GROWTH 5 DAYS Performed at [facility], [address], [place], [place] [address]    Report Status 09/22/2020 FINAL  Final  Resp Panel by RT-PCR (Flu A&B, Covid) Nasopharyngeal Swab     Status: None   Collection Time: 09/17/20 10:35 PM   Specimen: Nasopharyngeal Swab; Nasopharyngeal(NP) swabs in vial transport medium  Result Value Ref Range Status   SARS Coronavirus 2 by RT PCR NEGATIVE NEGATIVE Final    Comment: (NOTE) SARS-CoV-2 target nucleic acids are NOT DETECTED.  The SARS-CoV-2 RNA is generally detectable in upper respiratory specimens during the acute phase of infection. The lowest concentration of SARS-CoV-2 viral copies this assay can detect is 138 copies/mL. A negative result does not preclude SARS-Cov-2 infection and should not be used as the sole basis for treatment or other patient management decisions. A negative result may occur with  improper specimen collection/handling, submission of specimen other than nasopharyngeal swab, presence of viral mutation(s) within the areas targeted by this assay, and inadequate number of viral copies(<138 copies/mL). A negative result must be combined with clinical observations, patient history, and epidemiological information. The expected result is Negative.  Fact Sheet for Patients:  BloggerCourse.com  Fact Sheet for Healthcare Providers:  SeriousBroker.it  This test is no t yet approved or cleared by the [place] FDA and  has been authorized for detection and/or diagnosis of SARS-CoV-2 by FDA under an Emergency Use Authorization (EUA). This EUA will remain  in effect (meaning this test can be used) for the duration of the COVID-19 declaration under Section 564(b)(1) of the Act, 21 U.S.C.section 360bbb-3(b)(1),  unless the authorization is terminated  or revoked sooner.       Influenza A by PCR NEGATIVE NEGATIVE Final   Influenza B by PCR NEGATIVE NEGATIVE Final    Comment: (NOTE) The Xpert Xpress SARS-CoV-2/FLU/RSV plus assay is intended as an aid in the diagnosis of influenza from Nasopharyngeal swab specimens and should not be used as a sole basis for treatment. Nasal washings and aspirates are unacceptable for Xpert Xpress SARS-CoV-2/FLU/RSV testing.  Fact Sheet for Patients: BloggerCourse.com  Fact Sheet for Healthcare Providers: SeriousBroker.it  This test is not yet approved or cleared by the [place]  FDA and has been authorized for detection and/or diagnosis of SARS-CoV-2 by FDA under an Emergency Use Authorization (EUA). This EUA will remain in effect (meaning this test can be used) for the duration of the COVID-19 declaration under Section 564(b)(1) of the Act, 21 U.S.C. section 360bbb-3(b)(1), unless the authorization is terminated or revoked.  Performed at [facility], [address] [place], [place] [address]   Culture, blood (routine x 2)     Status: None   Collection Time: 09/18/20 12:01 AM   Specimen: BLOOD LEFT ARM  Result Value Ref Range Status   Specimen Description BLOOD LEFT ARM  Final   Special Requests   Final    BOTTLES DRAWN AEROBIC AND ANAEROBIC Blood Culture adequate volume   Culture   Final    NO GROWTH 5 DAYS Performed at [facility], [address], [place], [place] [address]    Report Status 09/23/2020 FINAL  Final     Labs: Basic Metabolic Panel: Recent Labs  Lab 09/17/20 2200 09/18/20 0351 09/21/20 0339 09/24/20 0426  NA 140 140 137 136  K 3.5 3.6 2.9* 3.7  CL 103 106 100 102  CO2 GLUCOSE 120* 149* 110* 137*  BUN CREATININE 0.77 0.66 0.63 0.62  CALCIUM 9.5 9.2 8.6* 8.8*  MG  --  2.1  --   --    Liver Function Tests: Recent Labs  Lab 09/17/20 2200  09/18/20 0351  AST 19 16  ALT 13 12  ALKPHOS 77 66  BILITOT 0.7 0.6  PROT 7.5 6.6  ALBUMIN 4.2 3.7   No results for input(s): LIPASE, AMYLASE in the last 168 hours. Recent Labs  Lab 09/18/20 0002 09/21/20 0339  AMMONIA 15 26   CBC: Recent Labs  Lab 09/17/20 2200 09/18/20 0351 09/21/20 0339 09/24/20 0426  WBC 11.6* 9.8 7.1 7.2  NEUTROABS 8.9*  --   --   --   HGB 15.1* 13.8 13.5 11.7*  HCT 45.0 41.0 40.2 35.0*  MCV 93.8 94.0 95.3 94.1  PLT 308 301 269 274   Cardiac Enzymes: No results for input(s): CKTOTAL, CKMB, CKMBINDEX, TROPONINI in the last 168 hours. BNP: Invalid input(s): POCBNP CBG: No results for input(s): GLUCAP in the last 168 hours.  Time coordinating discharge:  36 minutes  Signed:  Catarina Hartshorn, DO [facility] Pager: [phone] 09/24/2020, 9:07 AM

## 2020-09-25 DIAGNOSIS — I10 Essential (primary) hypertension: Secondary | ICD-10-CM | POA: Diagnosis not present

## 2020-09-25 DIAGNOSIS — G9341 Metabolic encephalopathy: Secondary | ICD-10-CM | POA: Diagnosis not present

## 2020-09-25 DIAGNOSIS — I639 Cerebral infarction, unspecified: Secondary | ICD-10-CM | POA: Diagnosis not present

## 2020-09-25 NOTE — Progress Notes (Signed)
PROGRESS NOTE  Kendra Lee Zafar ZOX:[id number] DOB: [birth year] DOA: 09/17/2020 PCP: Pcp, No  Brief History:  65 y.o. female, with no known medical history, who presents to the ED with altered mental status. History is quite limited 2/2 patient's acute encephalopathy. Earlier, family was able to provide some history to the ED provider. They reported that the patient's last known normal was 6/24. She has been somnolent, and not taking care of herself in that interval. Family was not aware of an EtOH or illicit drug use. They are not aware of an trauma, and there are not signs of trauma on exam. Family reported no infectious symptoms or fevers.  Work up revealed patient suffered bilateral thalamic infarcts that have contributed to her encephalopathy.  After a goals of care discussion, patient's son wanted full scope of care.  Patient failed swallow eval and PEG tube was placed 09/22/20 and enteral feeding was started and titrated to goal rate without difficulty.  ED: Vitals stable, encephalopathic, CT head shows focal regions of hypoattenuation seen in the genu of the right internal capsule, posterior limb of the left internal capsule and right thalamus which could reflect age-indeterminate infarction.  No other acute abnormality    Assessment/Plan: Acute ischemic stroke/Acute metabolic Encephalopathy -case discussed with neurology, Dr. Eligah East most likely due to stroke -initially patient was very somnolent only waking up to protopathic stimuli-->slow/gradual improvement near end of hospitalization -MR brain--scattered bilateral infarcts of thalamus -carotid US--R-ICA--50-69%; L-ICA <50% -MRA brain--no LVO -Echo (TTE)--EF 60-65%, no WMA, no PFO -A1C--5.5 -LDL--138 -continue ASA and plavix -discussed with cardiology--not a good candidate for TEE nor loop recorder -TSH--1.927 -ammonia 15>>26 -B12--262 -folate 4.8 -UA--no pyuria -09/23/20--more awake, occasionally  following commands -09/24/20--able to answer some questions appropriately and following commands -09/25/20--continues to answer questions, follow commands appropriately, more alert   Hyperlipidemia -continue statin   Dysphagia -speech eval-->not safe for po intake -IR placed PEG 09/22/20 -started enteral feeding 7/2 -tolerating Osmolite 1.2 @ 55cc/hr -Free Water 200 cc every 8 hours   Hypertension -allowing permissive hypertension initially -start IV hydralazine prn -started amlodipine when able to use PEG -started losartan and hydralazine per PEG   Hypokalemia -replete -mag 2.1   Tobacco abuse -she was smoking up to 2ppd prior to admssion -cessation discussed       Status is: Inpatient  Remains inpatient appropriate because:Persistent severe electrolyte disturbances  Dispo: The patient is from: Home              Anticipated d/c is to: SNF              Patient currently is medically stable to d/c.   Difficult to place patient No        Family Communication:   son updated at bedside 09/25/20  Consultants:  neurology  Code Status:  FULL  DVT Prophylaxis:  [place] Heparin   Procedures: As Listed in Progress Note Above  Antibiotics: None       Subjective: Patient denies cp, sob, abd pain.  No reports of vomiting, diarrhea or resp distress.  Objective: Vitals:   09/25/20 0342 09/25/20 0916 09/25/20 1348 09/25/20 1430  BP: (!) 170/76 (!) 141/70 (!) 111/59 132/66  Pulse: 66  68   Resp: 19  16   Temp: 98.1 F (36.7 C)  98 F (36.7 C)   TempSrc: Oral  Oral   SpO2: 100%  100%   Weight:      Height:  Intake/Output Summary (Last 24 hours) at 09/25/2020 1513 Last data filed at 09/24/2020 1700 Gross per 24 hour  Intake 2000 ml  Output 600 ml  Net 1400 ml   Weight change:  Exam:  General:  Pt is alert, follows commands appropriately, not in acute distress HEENT: No icterus, No thrush, No neck mass, [place]/AT Cardiovascular: RRR, S1/S2, no rubs, no  gallops Respiratory: bibasilar rales. No wheeze Abdomen: Soft/+BS, non tender, non distended, no guarding Extremities: No edema, No lymphangitis, No petechiae, No rashes, no synovitis   Data Reviewed: I have personally reviewed following labs and imaging studies Basic Metabolic Panel: Recent Labs  Lab 09/21/20 0339 09/24/20 0426  NA 137 136  K 2.9* 3.7  CL 100 102  CO2 25 27  GLUCOSE 110* 137*  BUN 8 11  CREATININE 0.63 0.62  CALCIUM 8.6* 8.8*   Liver Function Tests: No results for input(s): AST, ALT, ALKPHOS, BILITOT, PROT, ALBUMIN in the last 168 hours. No results for input(s): LIPASE, AMYLASE in the last 168 hours. Recent Labs  Lab 09/21/20 0339  AMMONIA 26   Coagulation Profile: Recent Labs  Lab 09/22/20 0455  INR 1.0   CBC: Recent Labs  Lab 09/21/20 0339 09/24/20 0426  WBC 7.1 7.2  HGB 13.5 11.7*  HCT 40.2 35.0*  MCV 95.3 94.1  PLT 269 274   Cardiac Enzymes: No results for input(s): CKTOTAL, CKMB, CKMBINDEX, TROPONINI in the last 168 hours. BNP: Invalid input(s): POCBNP CBG: No results for input(s): GLUCAP in the last 168 hours. HbA1C: No results for input(s): HGBA1C in the last 72 hours. Urine analysis:    Component Value Date/Time   COLORURINE YELLOW 09/18/2020 0020   APPEARANCEUR CLEAR 09/18/2020 0020   LABSPEC 1.013 09/18/2020 0020   PHURINE 5.0 09/18/2020 0020   GLUCOSEU NEGATIVE 09/18/2020 0020   HGBUR SMALL (A) 09/18/2020 0020   BILIRUBINUR NEGATIVE 09/18/2020 0020   KETONESUR 20 (A) 09/18/2020 0020   PROTEINUR NEGATIVE 09/18/2020 0020   NITRITE NEGATIVE 09/18/2020 0020   LEUKOCYTESUR NEGATIVE 09/18/2020 0020   Sepsis Labs: @LABRCNTIP (procalcitonin:4,lacticidven:4) ) Recent Results (from the past 240 hour(s))  Culture, blood (routine x 2)     Status: None   Collection Time: 09/17/20 10:00 PM   Specimen: Right Antecubital; Blood  Result Value Ref Range Status   Specimen Description RIGHT ANTECUBITAL  Final   Special Requests    Final    BOTTLES DRAWN AEROBIC AND ANAEROBIC Blood Culture results may not be optimal due to an excessive volume of blood received in culture bottles   Culture   Final    NO GROWTH 5 DAYS Performed at [facility], [address] [place], [place]    Report Status 09/22/2020 FINAL  Final  Resp Panel by RT-PCR (Flu A&B, Covid) Nasopharyngeal Swab     Status: None   Collection Time: 09/17/20 10:35 PM   Specimen: Nasopharyngeal Swab; Nasopharyngeal(NP) swabs in vial transport medium  Result Value Ref Range Status   SARS Coronavirus 2 by RT PCR NEGATIVE NEGATIVE Final    Comment: (NOTE) SARS-CoV-2 target nucleic acids are NOT DETECTED.  The SARS-CoV-2 RNA is generally detectable in upper respiratory specimens during the acute phase of infection. The lowest concentration of SARS-CoV-2 viral copies this assay can detect is 138 copies/mL. A negative result does not preclude SARS-Cov-2 infection and should not be used as the sole basis for treatment or other patient management decisions. A negative result may occur with  improper specimen collection/handling, submission of specimen other  than nasopharyngeal swab, presence of viral mutation(s) within the areas targeted by this assay, and inadequate number of viral copies(<138 copies/mL). A negative result must be combined with clinical observations, patient history, and epidemiological information. The expected result is Negative.  Fact Sheet for Patients:  BloggerCourse.com  Fact Sheet for Healthcare Providers:  SeriousBroker.it  This test is no t yet approved or cleared by the [place] FDA and  has been authorized for detection and/or diagnosis of SARS-CoV-2 by FDA under an Emergency Use Authorization (EUA). This EUA will remain  in effect (meaning this test can be used) for the duration of the COVID-19 declaration under Section 564(b)(1) of the Act, 21 U.S.C.section  360bbb-3(b)(1), unless the authorization is terminated  or revoked sooner.       Influenza A by PCR NEGATIVE NEGATIVE Final   Influenza B by PCR NEGATIVE NEGATIVE Final    Comment: (NOTE) The Xpert Xpress SARS-CoV-2/FLU/RSV plus assay is intended as an aid in the diagnosis of influenza from Nasopharyngeal swab specimens and should not be used as a sole basis for treatment. Nasal washings and aspirates are unacceptable for Xpert Xpress SARS-CoV-2/FLU/RSV testing.  Fact Sheet for Patients: BloggerCourse.com  Fact Sheet for Healthcare Providers: SeriousBroker.it  This test is not yet approved or cleared by the [place] FDA and has been authorized for detection and/or diagnosis of SARS-CoV-2 by FDA under an Emergency Use Authorization (EUA). This EUA will remain in effect (meaning this test can be used) for the duration of the COVID-19 declaration under Section 564(b)(1) of the Act, 21 U.S.C. section 360bbb-3(b)(1), unless the authorization is terminated or revoked.  Performed at [facility], [address] [place], [place] [address]   Culture, blood (routine x 2)     Status: None   Collection Time: 09/18/20 12:01 AM   Specimen: BLOOD LEFT ARM  Result Value Ref Range Status   Specimen Description BLOOD LEFT ARM  Final   Special Requests   Final    BOTTLES DRAWN AEROBIC AND ANAEROBIC Blood Culture adequate volume   Culture   Final    NO GROWTH 5 DAYS Performed at [facility], [address] [place], [place] [address]    Report Status 09/23/2020 FINAL  Final     Scheduled Meds:  amLODipine  5 mg Per Tube Daily   aspirin  81 mg Per Tube Daily   atorvastatin  80 mg Per Tube Daily   clopidogrel  75 mg Per Tube Daily   free water  200 mL Per Tube Q8H   heparin  5,000 Units Subcutaneous Q8H   hydrALAZINE  10 mg Oral Q8H   losartan  25 mg Per Tube Daily   mouth rinse  15 mL Mouth Rinse BID    neomycin-bacitracin-polymyxin   Topical Q1400   pneumococcal 23 valent vaccine  0.5 mL Intramuscular Tomorrow-1000   scopolamine  1 patch Transdermal Q72H   thiamine  100 mg Per Tube Daily   Continuous Infusions:  feeding supplement (OSMOLITE 1.2 CAL) 1,000 mL (09/24/20 1530)   folic acid (FOLVITE) IVPB 1 mg (09/25/20 0940)    Procedures/Studies: CT ABDOMEN WO CONTRAST  Result Date: 09/21/2020 CLINICAL DATA:  Evaluate anatomy prior to potential percutaneous gastrostomy tube placement. EXAM: CT ABDOMEN WITHOUT CONTRAST TECHNIQUE: Multidetector CT imaging of the abdomen was performed following the standard protocol without IV contrast. COMPARISON:  None. FINDINGS: The lack of intravenous contrast limits the ability to evaluate solid abdominal organs. Lower chest: Limited visualization of the lower thorax demonstrates  minimal dependent subpleural atelectasis. Normal heart size. No pericardial effusion. Hepatobiliary: Normal hepatic contour. Normal noncontrast appearance of the gallbladder given degree distention. No radiopaque gallstones. There is a trace amount of perihepatic ascites. Pancreas: Normal noncontrast appearance of the pancreas. Spleen: Normal noncontrast appearance of the spleen. 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 Normal noncontrast appearance of the right kidney. No renal stones. No right-sided urinary obstruction. Normal noncontrast appearance the bilateral adrenal glands. The urinary bladder was not imaged. Stomach/Bowel: The anterior wall the stomach is well apposed against the ventral wall of the abdomen without interposition of the hepatic parenchyma or the transverse colon. Moderate colonic stool burden without evidence of enteric obstruction. No pneumoperitoneum, pneumatosis or portal venous gas. Vascular/Lymphatic: Large amount of irregular calcified atherosclerotic plaque within a  bilobed ectatic abdominal aorta with dominant caudal component measuring 2.6 cm and maximal oblique short axis coronal diameter (coronal image 33, series 5). No bulky retroperitoneal or mesenteric lymphadenopathy. Other: Regional soft tissues appear normal. Musculoskeletal: No acute or aggressive osseous abnormalities. Mild DDD of L4-L5 with disc space height loss, endplate irregularity and sclerosis. IMPRESSION: 1. Gastric anatomy amenable to potential percutaneous gastrostomy tube placement as indicated. 2. 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 3. Mildly ectatic abdominal aorta measuring 2.6 cm in maximal diameter. Recommend follow-up aortic ultrasound in 5 years. This recommendation follows ACR consensus guidelines: White Paper of the ACR Incidental Findings Committee II on Vascular Findings. J Am Coll Radiol 2013; 10:789-794. 4. Aortic aneurysm NOS (ICD10-I71.9). Electronically Signed   By: Simonne ComeJohn  Watts M.D.   On: 09/21/2020 09:36   CT Head Wo Contrast  Result Date: 09/17/2020 CLINICAL DATA:  Mental status change, found this afternoon on couch EXAM: CT HEAD WITHOUT CONTRAST TECHNIQUE: Contiguous axial images were obtained from the base of the skull through the vertex without intravenous contrast. COMPARISON:  None. FINDINGS: Brain: Foci of hypoattenuation present in the right internal capsule as as a separate focus in the right caudate and posterior limb of the left internal capsule could reflect sequela of age-indeterminate lacunar type infarcts difficult to fully assess given a background of more diffuse patchy white matter hypoattenuation typically indicative of microvascular angiopathy in a patient of this age. Additional hypoattenuation in the pons and brainstem is more nonspecific given streak artifact across skull base. Symmetric prominence of the ventricles, cisterns and sulci compatible with parenchymal volume loss. No hyperdense  hemorrhage. No mass effect or midline shift. No extra-axial collection. Scattered benign dural calcifications. Vascular: Atherosclerotic calcification of the carotid siphons. No hyperdense vessel. Skull: No calvarial fracture or suspicious osseous lesion. No scalp swelling or hematoma. Sinuses/Orbits: Paranasal sinuses and mastoid air cells are predominantly clear. Other: Included orbital structures are unremarkable. 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 Could be further characterized with MR imaging as warranted. No other acute intracranial abnormality. These results were called by telephone at the time of interpretation on 09/17/2020 at 11:45 pm to provider Benjiman CoreNATHAN PICKERING , who verbally acknowledged these results. Electronically Signed   By: Kreg ShropshirePrice  DeHay M.D.   On: 09/17/2020 23:45   MR ANGIO HEAD WO CONTRAST  Result Date: 09/18/2020 CLINICAL DATA:  Altered mental status. EXAM: MRI HEAD WITHOUT CONTRAST  MRA HEAD WITHOUT CONTRAST TECHNIQUE: Multiplanar, multi-echo pulse sequences of the brain and surrounding structures were acquired without intravenous contrast. Angiographic images of the Circle of Willis were acquired using MRA technique without intravenous contrast. COMPARISON:  No pertinent prior exam. FINDINGS: MRI HEAD FINDINGS Brain: Restricted diffusion in the bilateral thalamus, bilateral internal capsule, left centrum semiovale, and right subcortical frontal. These have the appearance of acute infarcts. Mild small vessel ischemic type change in the pons and hemispheric white matter. No evidence of hemorrhage, hydrocephalus, or collection. Vascular: Preserved flow voids. Skull and upper cervical spine: Normal marrow signal  Sinuses/Orbits: Negative Other: Significant and progressive motion artifact MRA HEAD FINDINGS Very motion degraded. Carotid, vertebral, and basilar arteries and major branches are patent with gross symmetry. IMPRESSION: Brain MRI: 1. 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 These are not in a unified arterial or venous distribution and are of uncertain cause. No revealing background chronic brain findings. 2. Significantly motion degraded Intracranial MRA: Very motion degraded and of limited utility other than documenting patency of major vessels. Electronically Signed   By: Marnee Spring M.D.   On: 09/18/2020 08:43   MR BRAIN WO CONTRAST  Result Date: 09/18/2020 CLINICAL DATA:  Altered mental status. EXAM: MRI HEAD WITHOUT CONTRAST MRA HEAD WITHOUT CONTRAST TECHNIQUE: Multiplanar, multi-echo pulse sequences of the brain and surrounding structures were acquired without intravenous contrast. Angiographic images of the Circle of Willis were acquired using MRA technique without intravenous contrast. COMPARISON:  No pertinent prior exam. FINDINGS: MRI HEAD FINDINGS Brain: Restricted diffusion in the bilateral thalamus, bilateral internal capsule, left centrum semiovale, and right subcortical frontal. These have the appearance of acute infarcts. Mild small vessel ischemic type change in the pons and hemispheric white matter. No evidence of hemorrhage, hydrocephalus, or collection. Vascular: Preserved flow voids. Skull and upper cervical spine: Normal marrow signal Sinuses/Orbits: Negative Other: Significant and progressive motion artifact MRA HEAD FINDINGS Very motion degraded. Carotid, vertebral, and basilar arteries and major branches are patent with gross symmetry. IMPRESSION: Brain MRI: 1. 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 These are  not in a unified arterial or venous distribution and are of uncertain cause. No revealing background chronic brain findings. 2. Significantly motion degraded Intracranial MRA: Very motion degraded and of limited utility other than documenting patency of major vessels. Electronically Signed   By: Marnee Spring M.D.   On: 09/18/2020 08:43   IR GASTROSTOMY TUBE MOD SED  Result Date: 09/22/2020 INDICATION: 65 year old with altered mental status and dysphagia. Request for gastrostomy tube placement. EXAM: PERCUTANEOUS GASTROSTOMY TUBE WITH FLUOROSCOPIC GUIDANCE Physician: Rachelle Hora. Lowella Dandy, MD MEDICATIONS: Ancef 2 g; Antibiotics were administered within 1 hour of the procedure. Glucagon 0.5 mg ANESTHESIA/SEDATION: Versed 2.0 mg IV; Fentanyl 100 mcg IV Moderate Sedation Time:  42 minutes The patient was continuously monitored during the procedure by the interventional radiology nurse under my direct supervision. FLUOROSCOPY TIME:  Fluoroscopy Time: 12 minutes, 6 seconds, 19 mGy CONTRAST:  20 mL Omnipaque 300 COMPLICATIONS: None immediate. PROCEDURE: Informed consent was obtained for a percutaneous gastrostomy tube. The patient was placed on the interventional table. An orogastric tube was placed with fluoroscopic guidance. The anterior abdomen was prepped and draped in sterile fashion. Maximal barrier sterile technique was utilized including caps, mask, sterile gowns, sterile gloves, sterile drape, hand hygiene and skin antiseptic. Stomach was inflated with air through the orogastric tube. The skin  and subcutaneous tissues were anesthetized with 1% lidocaine. A 17 gauge needle was directed into the distended stomach with fluoroscopic guidance. A wire was advanced into the stomach and a T fastener was deployed. A 9-French vascular sheath was placed and the orogastric tube was snared using a [place] snare device. At this time, the patient was clenching her teeth and we could not open her mouth in order to place the  pull-through gastrostomy tube. As a result, we converted to a push through gastrostomy tube. Skin around the 9 French drain was anesthetized with 1% lidocaine. Using fluoroscopic guidance, 2 additional T-fasteners were deployed. The T fastener associated with the 9 French sheath was cut. The 9 French sheath was removed and dilated up to an 18 [place] peel-away sheath. A 16 French Entuit gastrostomy tube was advanced over the wire and through the peel-away sheath. The balloon was inflated with approximately 8 mL of saline. Peel-away sheath and wire were removed. Contrast injection confirmed placement in the stomach. Fluoroscopic images were obtained for documentation. The gastrostomy tube was flushed with normal saline. FINDINGS: 68 French balloon retention gastrostomy tube is positioned in the stomach. Total of 3 T-fasteners were deployed and there are still 2 T-fasteners intact. Contrast injection confirmed placement in the stomach. IMPRESSION: 1. Successful placement of a percutaneous gastrostomy tube using fluoroscopy. This is a balloon retention gastrostomy tube. 2. There are 2 remaining T-fasteners in place. Plan to cut the T-fasteners in 7-10 days. Electronically Signed   By: Richarda Overlie M.D.   On: 09/22/2020 12:07   US Carotid Bilateral (at [facility] and [facility] only)  Result Date: 09/18/2020 CLINICAL DATA:  Altered mental status Hypertension Stroke EXAM: BILATERAL CAROTID DUPLEX ULTRASOUND TECHNIQUE: Wallace Cullens scale imaging, color Doppler and duplex ultrasound were performed of bilateral carotid and vertebral arteries in the neck. COMPARISON:  None. FINDINGS: Criteria: Quantification of carotid stenosis is based on velocity parameters that correlate the residual internal carotid diameter with NASCET-based stenosis levels, using the diameter of the distal internal carotid lumen as the denominator for stenosis measurement. The following velocity measurements were obtained: RIGHT ICA: 156/29 cm/sec CCA: 77/11 cm/sec  SYSTOLIC ICA/CCA RATIO:  2.0 ECA: 173 cm/sec LEFT ICA: 83/18 cm/sec CCA: 85/14 cm/sec SYSTOLIC ICA/CCA RATIO:  1.0 ECA: 92 cm/sec RIGHT CAROTID ARTERY: Heterogeneous calcified and noncalcified plaque of the distal common and proximal internal carotid arteries with velocity indicative of 50-69% stenosis. RIGHT VERTEBRAL ARTERY:  Antegrade flow. LEFT CAROTID ARTERY: Heterogeneous calcified and noncalcified plaque of the distal common and proximal internal carotid arteries with velocity parameters indicative of less than 50% stenosis. LEFT VERTEBRAL ARTERY:  Antegrade flow. IMPRESSION: 1. 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 2. Less than 50% stenosis of the left internal carotid artery. 3. Long segment shadowing plaque present in both common and internal carotid arteries, which can obscure higher velocities. Further evaluation with MRI or CT of the neck should be performed for more precise evaluation of degree of stenosis. Electronically Signed   By: Acquanetta Belling M.D.   On: 09/18/2020 10:01   DG Chest Portable 1 View  Result Date: 09/17/2020 CLINICAL DATA:  Altered mental status EXAM: PORTABLE CHEST 1 VIEW COMPARISON:  None FINDINGS: There is hyperinflation of the lungs compatible with COPD. Heart and mediastinal contours are within normal limits. No focal opacities or effusions. No acute bony abnormality. IMPRESSION: COPD.  No active disease. Electronically Signed   By: Charlett Nose M.D.   On: 09/17/2020 23:02   ECHOCARDIOGRAM COMPLETE  Result Date: 09/18/2020    ECHOCARDIOGRAM REPORT   Patient Name:   [facility] Date of Exam: 09/18/2020 Medical Rec #:  [id number]       Height: Accession #:    [id number]      Weight: Date of Birth:  17-Feb-1956       BSA: Patient Age:    64 years        BP:           213/100 mmHg Patient Gender: F               HR:           59 bpm. [facility] Procedure: 2D Echo, Cardiac Doppler and Color Doppler Indications:    Stroke  History:        Patient  has no prior history of Echocardiogram examinations.                 Stroke; Risk Factors:Hypertension. Height and Weight not                 available.  Sonographer:    Mikki Harbor Referring Phys: [id number] ASIA B ZIERLE-GHOSH  Sonographer Comments: Height and Weight not available IMPRESSIONS  1. Left ventricular ejection fraction, by estimation, is 60 to 65%. The left ventricle has normal function. The left ventricle has no regional wall motion abnormalities. There is mild left ventricular hypertrophy. Left ventricular diastolic parameters were normal.  2. Right ventricular systolic function is normal. The right ventricular size is normal. Tricuspid regurgitation signal is inadequate for assessing PA pressure.  3. The mitral valve is grossly normal. Trivial mitral valve regurgitation.  4. The aortic valve is tricuspid. Aortic valve regurgitation is not visualized. Mild aortic valve sclerosis is present, with no evidence of aortic valve stenosis. Aortic valve mean gradient measures 3.0 mmHg.  5. The inferior vena cava is normal in size with greater than 50% respiratory variability, suggesting right atrial pressure of 3 mmHg. FINDINGS  Left Ventricle: Left ventricular ejection fraction, by estimation, is 60 to 65%. The left ventricle has normal function. The left ventricle has no regional wall motion abnormalities. The left ventricular internal cavity size was normal in size. There is  mild left ventricular hypertrophy. Left ventricular diastolic parameters were normal. Right Ventricle: The right ventricular size is normal. No increase in right ventricular wall thickness. Right ventricular systolic function is normal. Tricuspid regurgitation signal is inadequate for assessing PA pressure. Left Atrium: Left atrial size was normal in size. Right Atrium: Right atrial size was normal in size. Pericardium: There is no evidence of pericardial effusion. Presence of pericardial fat pad. Mitral Valve: The mitral valve is  grossly normal. Mild mitral annular calcification. Trivial mitral valve regurgitation. MV peak gradient, 2.7 mmHg. The mean mitral valve gradient is 1.0 mmHg. Tricuspid Valve: The tricuspid valve is grossly normal. Tricuspid valve regurgitation is trivial. Aortic Valve: The aortic valve is tricuspid. There is mild aortic valve annular calcification. Aortic valve regurgitation is not visualized. Mild aortic valve sclerosis is present, with no evidence of aortic valve stenosis. Aortic valve mean gradient measures 3.0 mmHg. Aortic valve peak gradient measures 5.7 mmHg. Aortic valve area, by VTI measures 2.71 cm. Pulmonic Valve: The pulmonic valve was grossly normal. Pulmonic valve regurgitation is trivial. Aorta: The aortic root is normal in size and structure. Venous: The inferior vena cava is normal in size with greater than 50% respiratory variability, suggesting right atrial pressure of 3 mmHg. IAS/Shunts: The  interatrial septum appears to be lipomatous. No atrial level shunt detected by color flow Doppler.  LEFT VENTRICLE PLAX 2D LVIDd:         4.38 cm  Diastology LVIDs:         2.91 cm  LV e' medial:    7.10 cm/s LV PW:         1.04 cm  LV E/e' medial:  11.9 LV IVS:        1.06 cm  LV e' lateral:   7.62 cm/s LVOT diam:     2.00 cm  LV E/e' lateral: 11.0 LV SV:         90 LVOT Area:     3.14 cm  RIGHT VENTRICLE RV Basal diam:  3.37 cm RV Mid diam:    3.06 cm RV S prime:     10.20 cm/s TAPSE (M-mode): 2.3 cm LEFT ATRIUM             RIGHT ATRIUM LA diam:        3.00 cm RA Area:     13.30 cm LA Vol (A2C):   49.5 ml RA Volume:   31.30 ml LA Vol (A4C):   28.7 ml LA Biplane Vol: 40.5 ml  AORTIC VALVE AV Area (Vmax):    2.85 cm AV Area (Vmean):   2.55 cm AV Area (VTI):     2.71 cm AV Vmax:           119.00 cm/s AV Vmean:          86.100 cm/s AV VTI:            0.330 m AV Peak Grad:      5.7 mmHg AV Mean Grad:      3.0 mmHg LVOT Vmax:         108.00 cm/s LVOT Vmean:        69.800 cm/s LVOT VTI:          0.285 m  LVOT/AV VTI ratio: 0.86  AORTA Ao Root diam: 3.30 cm MITRAL VALVE MV Area (PHT): 3.15 cm    SHUNTS MV Area VTI:   2.54 cm    Systemic VTI:  0.29 m MV Peak grad:  2.7 mmHg    Systemic Diam: 2.00 cm MV Mean grad:  1.0 mmHg MV Vmax:       0.81 m/s MV Vmean:      41.2 cm/s MV Decel Time: 241 msec MV E velocity: 84.20 cm/s MV A velocity: 72.70 cm/s MV E/A ratio:  1.16 Nona Dell MD Electronically signed by Nona Dell MD Signature Date/Time: 09/18/2020/2:03:27 PM    Final     Catarina Hartshorn, DO  [facility]  If 7PM-7AM, please contact night-coverage [email or web address] Password [email or web address] 09/25/2020, 3:13 PM   LOS: 7 days

## 2020-09-25 NOTE — Progress Notes (Signed)
Physical Therapy Treatment Patient Details Name: Kendra Lee MRN: [id number] DOB: [birth year] Today's Date: 09/25/2020    History of Present Illness Kendra Lee  is a 65 y.o. female s/p CVA, admitted on 09/17/2020    PT Comments    Pt willing to participate with co-treatment with OT this session.  Pt able to nod/shake head to communication and good follow through with all instructions.  Pt required min assist with trunk and UE, able to move LE independently to sitting on EOB. Cueing for handplacement to assist with standing to walker and able to ambulate 22ft to chair with min A.  EOS pt limited by fatigue.  Pt left in chair with call bell within reach and chair alarm set.     Follow Up Recommendations  SNF     Equipment Recommendations  None recommended by PT    Recommendations for Other Services Speech consult     Precautions / Restrictions Precautions Precautions: Fall Restrictions Weight Bearing Restrictions: No    Mobility  Bed Mobility Overal bed mobility: Needs Assistance Bed Mobility: Supine to Sit     Supine to sit: Min assist;HOB elevated Sit to supine: Min assist   General bed mobility comments: min/mod assist to upright trunk, able to move BLE independenty    Transfers Overall transfer level: Needs assistance Equipment used: Rolling walker (2 wheeled) Transfers: Sit to/from [employer] Sit to Stand: Min assist Stand pivot transfers: Min assist       General transfer comment: Cueing for Lt UE handplacement to assist with standing  Ambulation/Gait   Gait Distance (Feet): 3 Feet Assistive device: Rolling walker (2 wheeled) (OT assisted with Rt UE handplacement on RW) Gait Pattern/deviations: Decreased stride length;Shuffle     General Gait Details: slow labored, no LOB   Stairs             Wheelchair Mobility    Modified Rankin (Stroke Patients Only)       Balance                                             Cognition Arousal/Alertness: Awake/alert Behavior During Therapy: Flat affect Overall Cognitive Status: Within Functional Limits for tasks assessed                                 General Comments: Pt is able to nod or shake head appropriately to questions      Exercises      General Comments        Pertinent Vitals/Pain Pain Assessment: No/denies pain    Home Living                      Prior Function            PT Goals (current goals can now be found in the care plan section) Acute Rehab PT Goals Patient Stated Goal: none stated    Frequency    Min 3X/week      PT Plan      Co-evaluation PT/OT/SLP Co-Evaluation/Treatment: Yes Reason for Co-Treatment: Complexity of the patient's impairments (multi-system involvement);For patient/therapist safety;To address functional/ADL transfers   OT goals addressed during session: ADL's and self-care;Proper use of Adaptive equipment and DME      AM-PAC PT "6 Clicks" Mobility  Outcome Measure  Help needed turning from your back to your side while in a flat bed without using bedrails?: A Little Help needed moving from lying on your back to sitting on the side of a flat bed without using bedrails?: A Little Help needed moving to and from a bed to a chair (including a wheelchair)?: A Lot Help needed standing up from a chair using your arms (e.g., wheelchair or bedside chair)?: A Lot Help needed to walk in hospital room?: A Lot Help needed climbing 3-5 steps with a railing? : Total 6 Click Score: 13    End of Session Equipment Utilized During Treatment: Gait belt Activity Tolerance: Patient limited by fatigue;Patient limited by lethargy Patient left: in chair;with chair alarm set;with call bell/phone within reach Nurse Communication: Mobility status PT Visit Diagnosis: Unsteadiness on feet (R26.81);Other abnormalities of gait and mobility (R26.89);Muscle weakness (generalized)  (M62.81);Other symptoms and signs involving the nervous system (R29.898)     Time: 1191-4782 PT Time Calculation (min) (ACUTE ONLY): 33 min  Charges:  $Therapeutic Activity: 8-22 mins                     Becky Sax, LPTA/CLT; CBIS [phone]   Juel Burrow 09/25/2020, 1:58 PM

## 2020-09-25 NOTE — Progress Notes (Signed)
  Speech Language Pathology Treatment: Dysphagia  Patient Details Name: Kendra Lee MRN: [id number] DOB: [birth year] Today's Date: 09/25/2020 Time: 3790-2409 SLP Time Calculation (min) (ACUTE ONLY): 27 min  Assessment / Plan / Recommendation Clinical Impression  Pt seen for ongoing dysphagia intervention s/p PEG placed on Friday due to severity of dysphagia and lethargy from acute stroke. Pt is alert and cooperative and agreeable to oral care and po trials. Pt continues to present with right facial weakness with reduced labial closure and pooling of oral secretions. Vocal intensity is diminished and she requires max cues to increase loudness. Pt with weak cued cough, but stronger vocal intensity with "ahhh". She consumed several ice chips and ~30cc NTL via tsp and cup sips with occasional delayed cough. Pt would benefit from MBSS or FEES once she is more consistently able to maintain alertness for po intake and tolerance of po. SLP will continue to follow in acute, however she is awaiting SNF placement.    HPI HPI: 65 y.o. female  with past medical history of no known medical history admitted on 09/17/2020 with acute metabolic encephalopathy, CVA acute embolic stroke. MRI shows Scattered small vessel infarcts in the bilateral thalamus,   bilateral internal capsule, and bilateral hemispheric white matter. Pt s/p PEG on Friday due to severity of recent stroke with dysphagia.      SLP Plan  Continue with current plan of care       Recommendations  Diet recommendations: NPO Liquids provided via: Teaspoon;Cup Medication Administration: Via alternative means Compensations: Slow rate;Small sips/bites                Oral Care Recommendations: Oral care QID;Staff/trained caregiver to provide oral care Follow up Recommendations: Skilled Nursing facility SLP Visit Diagnosis: Dysphagia, unspecified (R13.10) Plan: Continue with current plan of care       Thank you,  [name],  CCC-SLP [phone]                 Theseus Birnie 09/25/2020, 9:43 AM

## 2020-09-25 NOTE — Progress Notes (Signed)
Occupational Therapy Treatment Patient Details Name: Kendra Lee MRN: [id number] DOB: February 04, 1956 Today's Date: 09/25/2020    History of present illness Kendra Lee  is a 65 y.o. female s/p CVA, admitted on 09/17/2020   OT comments  Pt agreeable to OT/PT co-treatment today. Pt now with slight movement/muscle contraction in RUE, able to lightly grip RW and maintain grasp throughout transfer. Pt using LUE and washcloth to manage secretions, OT assisting with oral suction. Pt whispering answers to questions with increased time. Discharge to SNF remains appropriate.     Follow Up Recommendations  SNF    Equipment Recommendations  None recommended by OT    Recommendations for Other Services      Precautions / Restrictions Precautions Precautions: Fall Restrictions Weight Bearing Restrictions: No       Mobility Bed Mobility Overal bed mobility: Needs Assistance Bed Mobility: Supine to Sit     Supine to sit: Min assist;HOB elevated          Transfers Overall transfer level: Needs assistance Equipment used: Rolling walker (2 wheeled) Transfers: Sit to/from [employer] Sit to Stand: Min assist Stand pivot transfers: Min assist                ADL either performed or assessed with clinical judgement   ADL Overall ADL's : Needs assistance/impaired     Grooming: Oral care;Moderate assistance;Sitting;Bed level Grooming Details (indicate cue type and reason): Pt wiping mouth with hand and with washcloth using left hand. OT continued to assist with suction for mouth                 Toilet Transfer: Minimal assistance;Stand-pivot;BSC;RW Toilet Transfer Details (indicate cue type and reason): simulated with bed to chair transfer           General ADL Comments: Pt limited in ADL completion due to RUE flaccidity and general lethargy      Cognition Arousal/Alertness: Awake/alert Behavior During Therapy: Flat affect Overall Cognitive  Status: Within Functional Limits for tasks assessed                                                     Pertinent Vitals/ Pain       Pain Assessment: No/denies pain         Frequency  Min 2X/week        Progress Toward Goals  OT Goals(current goals can now be found in the care plan section)  Progress towards OT goals: Progressing toward goals  Acute Rehab OT Goals Patient Stated Goal: none stated OT Goal Formulation: Patient unable to participate in goal setting Time For Goal Achievement: 10/03/20 Potential to Achieve Goals: Good ADL Goals Pt Will Perform Grooming: with supervision;sitting;standing Pt Will Perform Upper Body Dressing: with min assist;sitting;standing Pt Will Transfer to Toilet: with mod assist;stand pivot transfer;ambulating;regular height toilet;bedside commode Pt Will Perform Toileting - Clothing Manipulation and hygiene: with min assist;sitting/lateral leans;sit to/from stand Pt/caregiver will Perform Home Exercise Program: Increased ROM;Increased strength;Both right and left upper extremity;With Supervision;With written HEP provided  Plan Discharge plan remains appropriate    Co-evaluation    PT/OT/SLP Co-Evaluation/Treatment: Yes Reason for Co-Treatment: Complexity of the patient's impairments (multi-system involvement);For patient/therapist safety;To address functional/ADL transfers   OT goals addressed during session: ADL's and self-care;Proper use of Adaptive equipment and DME  End of Session Equipment Utilized During Treatment: Oxygen  OT Visit Diagnosis: Muscle weakness (generalized) (M62.81)   Activity Tolerance Patient tolerated treatment well   Patient Left in chair;with call bell/phone within reach;with chair alarm set   Nurse Communication          Time: 6767-2094 OT Time Calculation (min): 33 min  Charges: OT General Charges $OT Visit: 1 Visit OT Treatments $Self Care/Home Management : 8-22  mins     Ezra Sites, OTR/L  [phone] 09/25/2020, 12:26 PM

## 2020-09-26 DIAGNOSIS — Z515 Encounter for palliative care: Secondary | ICD-10-CM | POA: Diagnosis not present

## 2020-09-26 DIAGNOSIS — G9341 Metabolic encephalopathy: Secondary | ICD-10-CM | POA: Diagnosis not present

## 2020-09-26 DIAGNOSIS — Z7189 Other specified counseling: Secondary | ICD-10-CM | POA: Diagnosis not present

## 2020-09-26 DIAGNOSIS — I639 Cerebral infarction, unspecified: Secondary | ICD-10-CM | POA: Diagnosis not present

## 2020-09-26 DIAGNOSIS — I10 Essential (primary) hypertension: Secondary | ICD-10-CM | POA: Diagnosis not present

## 2020-09-26 NOTE — Discharge Summary (Signed)
Physician Discharge Summary  Talley Casco Spillane WUJ:[id number] DOB: 1956-02-15 DOA: 09/17/2020  PCP: Pcp, No  Admit date: 09/17/2020 Discharge date: 09/26/2020  Admitted From: Home Disposition:  SNF  Recommendations for Outpatient Follow-up:  Follow up with PCP in 1-2 weeks Please obtain BMP/CBC in one week Please remove ZIO patch (heart monitor) on 10/05/20 and mail back in pre-packaged envelope which patient's son has in possession   Discharge Condition: Stable CODE STATUS: FULL Diet recommendation: NPO for now--Osmolite 1.2 @ 55 cc/ hour with free water 200 cc every 8 hr   Brief/Interim Summary: 65 y.o. female, with no known medical history, who presents to the ED with altered mental status. History is quite limited 2/2 patient's acute encephalopathy. Earlier, family was able to provide some history to the ED provider. They reported that the patient's last known normal was 6/24. She has been somnolent, and not taking care of herself in that interval. Family was not aware of an EtOH or illicit drug use. They are not aware of an trauma, and there are not signs of trauma on exam. Family reported no infectious symptoms or fevers.  Work up revealed patient suffered bilateral thalamic infarcts that have contributed to her encephalopathy.  After a goals of care discussion, patient's son wanted full scope of care.  Patient failed swallow eval and PEG tube was placed 09/22/20 and enteral feeding was started and titrated to goal rate without difficulty.  ED: Vitals stable, encephalopathic, CT head shows focal regions of hypoattenuation seen in the genu of the right internal capsule, posterior limb of the left internal capsule and right thalamus which could reflect age-indeterminate infarction.  No other acute abnormality    Discharge Diagnoses:   Acute ischemic stroke/Acute metabolic Encephalopathy -case discussed with neurology, Dr. Eligah East most likely due to stroke -initially  patient was very somnolent only waking up to protopathic stimuli-->slow/gradual improvement near end of hospitalization -MR brain--scattered bilateral infarcts of thalamus -carotid US--R-ICA--50-69%; L-ICA <50% -MRA brain--no LVO -Echo (TTE)--EF 60-65%, no WMA, no PFO -A1C--5.5 -LDL--138 -continue ASA and plavix -discussed with cardiology--not a good candidate for TEE nor loop recorder -TSH--1.927 -ammonia 15>>26 -B12--262 -folate 4.8 -UA--no pyuria -09/23/20--more awake, occasionally following commands -09/24/20--able to answer some questions appropriately and following commands -09/25/20 and 7/5--continues to answer questions, follow commands appropriately, more alert   Hyperlipidemia -continue statin   Dysphagia -speech eval-->not safe for po intake -IR placed PEG 09/22/20 -started enteral feeding 7/2 -tolerating Osmolite 1.2 @ 55cc/hr -Free Water 200 cc every 8 hours   Hypertension -allowing permissive hypertension initially -start IV hydralazine prn -started amlodipine when able to use PEG -started losartan and hydralazine per PEG   Hypokalemia -replete -mag 2.1   Tobacco abuse -she was smoking up to 2ppd prior to admssion -cessation discussed        Discharge Instructions   Allergies as of 09/26/2020   No Known Allergies      Medication List     TAKE these medications    amLODipine 5 MG tablet Commonly known as: NORVASC Place 1 tablet (5 mg total) into feeding tube daily.   aspirin 81 MG chewable tablet Place 1 tablet (81 mg total) into feeding tube daily.   atorvastatin 80 MG tablet Commonly known as: LIPITOR Place 1 tablet (80 mg total) into feeding tube daily.   clopidogrel 75 MG tablet Commonly known as: PLAVIX Place 1 tablet (75 mg total) into feeding tube daily.   feeding supplement (OSMOLITE 1.2 CAL) Liqd Infuse at 55 cc/hour via  pump   free water Soln Place 200 mLs into feeding tube every 8 (eight) hours.   hydrALAZINE 10 MG  tablet Commonly known as: APRESOLINE Take 1 tablet (10 mg total) by mouth every 8 (eight) hours.   losartan 25 MG tablet Commonly known as: COZAAR Place 1 tablet (25 mg total) into feeding tube daily.   thiamine 100 MG tablet Place 1 tablet (100 mg total) into feeding tube daily.        Contact information for after-discharge care     Destination     [facility] .   Service: Skilled Nursing Contact information: [address] [place] [address] [phone]                    No Known Allergies  Consultations: Palliative IR neurology   Procedures/Studies: CT ABDOMEN WO CONTRAST  Result Date: 09/21/2020 CLINICAL DATA:  Evaluate anatomy prior to potential percutaneous gastrostomy tube placement. EXAM: CT ABDOMEN WITHOUT CONTRAST TECHNIQUE: Multidetector CT imaging of the abdomen was performed following the standard protocol without IV contrast. COMPARISON:  None. FINDINGS: The lack of intravenous contrast limits the ability to evaluate solid abdominal organs. Lower chest: Limited visualization of the lower thorax demonstrates minimal dependent subpleural atelectasis. Normal heart size. No pericardial effusion. Hepatobiliary: Normal hepatic contour. Normal noncontrast appearance of the gallbladder given degree distention. No radiopaque gallstones. There is a trace amount of perihepatic ascites. Pancreas: Normal noncontrast appearance of the pancreas. Spleen: Normal noncontrast appearance of the spleen. 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 Normal noncontrast appearance of the right kidney. No renal stones. No right-sided urinary obstruction. Normal noncontrast appearance the bilateral adrenal glands. The urinary bladder was not imaged. Stomach/Bowel: The anterior wall the stomach is well apposed against the ventral wall of the  abdomen without interposition of the hepatic parenchyma or the transverse colon. Moderate colonic stool burden without evidence of enteric obstruction. No pneumoperitoneum, pneumatosis or portal venous gas. Vascular/Lymphatic: Large amount of irregular calcified atherosclerotic plaque within a bilobed ectatic abdominal aorta with dominant caudal component measuring 2.6 cm and maximal oblique short axis coronal diameter (coronal image 33, series 5). No bulky retroperitoneal or mesenteric lymphadenopathy. Other: Regional soft tissues appear normal. Musculoskeletal: No acute or aggressive osseous abnormalities. Mild DDD of L4-L5 with disc space height loss, endplate irregularity and sclerosis. IMPRESSION: 1. Gastric anatomy amenable to potential percutaneous gastrostomy tube placement as indicated. 2. 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 3. Mildly ectatic abdominal aorta measuring 2.6 cm in maximal diameter. Recommend follow-up aortic ultrasound in 5 years. This recommendation follows ACR consensus guidelines: White Paper of the ACR Incidental Findings Committee II on Vascular Findings. J Am Coll Radiol 2013; 10:789-794. 4. Aortic aneurysm NOS (ICD10-I71.9). Electronically Signed   By: Simonne Come M.D.   On: 09/21/2020 09:36   CT Head Wo Contrast  Result Date: 09/17/2020 CLINICAL DATA:  Mental status change, found this afternoon on couch EXAM: CT HEAD WITHOUT CONTRAST TECHNIQUE: Contiguous axial images were obtained from the base of the skull through the vertex without intravenous contrast. COMPARISON:  None. FINDINGS: Brain: Foci of hypoattenuation present in the right internal capsule as as a separate focus in the right caudate and posterior limb of the left internal capsule could reflect sequela of age-indeterminate lacunar type infarcts difficult to fully assess given a background of more diffuse patchy white matter hypoattenuation typically  indicative  of microvascular angiopathy in a patient of this age. Additional hypoattenuation in the pons and brainstem is more nonspecific given streak artifact across skull base. Symmetric prominence of the ventricles, cisterns and sulci compatible with parenchymal volume loss. No hyperdense hemorrhage. No mass effect or midline shift. No extra-axial collection. Scattered benign dural calcifications. Vascular: Atherosclerotic calcification of the carotid siphons. No hyperdense vessel. Skull: No calvarial fracture or suspicious osseous lesion. No scalp swelling or hematoma. Sinuses/Orbits: Paranasal sinuses and mastoid air cells are predominantly clear. Other: Included orbital structures are unremarkable. 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 Could be further characterized with MR imaging as warranted. No other acute intracranial abnormality. These results were called by telephone at the time of interpretation on 09/17/2020 at 11:45 pm to provider Benjiman Core , who verbally acknowledged these results. Electronically Signed   By: Kreg Shropshire M.D.   On: 09/17/2020 23:45   MR ANGIO HEAD WO CONTRAST  Result Date: 09/18/2020 CLINICAL DATA:  Altered mental status. EXAM: MRI HEAD WITHOUT CONTRAST MRA HEAD WITHOUT CONTRAST TECHNIQUE: Multiplanar, multi-echo pulse sequences of the brain and surrounding structures were acquired without intravenous contrast. Angiographic images of the Circle of Willis were acquired using MRA technique without intravenous contrast. COMPARISON:  No pertinent prior exam. FINDINGS: MRI HEAD FINDINGS Brain: Restricted diffusion in the bilateral thalamus, bilateral internal capsule, left centrum semiovale, and right  subcortical frontal. These have the appearance of acute infarcts. Mild small vessel ischemic type change in the pons and hemispheric white matter. No evidence of hemorrhage, hydrocephalus, or collection. Vascular: Preserved flow voids. Skull and upper cervical spine: Normal marrow signal Sinuses/Orbits: Negative Other: Significant and progressive motion artifact MRA HEAD FINDINGS Very motion degraded. Carotid, vertebral, and basilar arteries and major branches are patent with gross symmetry. IMPRESSION: Brain MRI: 1. 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 These are not in a unified arterial or venous distribution and are of uncertain cause. No revealing background chronic brain findings. 2. Significantly motion degraded Intracranial MRA: Very motion degraded and of limited utility other than documenting patency of major vessels. Electronically Signed   By: Marnee Spring M.D.   On: 09/18/2020 08:43   MR BRAIN WO CONTRAST  Result Date: 09/18/2020 CLINICAL DATA:  Altered mental status. EXAM: MRI HEAD WITHOUT CONTRAST MRA HEAD WITHOUT CONTRAST TECHNIQUE: Multiplanar, multi-echo pulse sequences of the brain and surrounding structures were acquired without intravenous contrast. Angiographic images of the Circle of Willis were acquired using MRA technique without intravenous contrast. COMPARISON:  No pertinent prior exam. FINDINGS: MRI HEAD FINDINGS Brain: Restricted diffusion in the bilateral thalamus, bilateral internal capsule, left centrum semiovale, and right subcortical frontal. These have the appearance of acute infarcts. Mild small vessel ischemic type change in the pons and hemispheric white matter. No evidence of hemorrhage, hydrocephalus, or collection. Vascular: Preserved flow voids. Skull and upper cervical spine: Normal marrow signal Sinuses/Orbits: Negative Other: Significant and progressive motion artifact MRA HEAD  FINDINGS Very motion degraded. Carotid, vertebral, and basilar arteries and major branches are patent with gross symmetry. IMPRESSION: Brain MRI: 1. 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 These are not in a unified arterial or venous distribution and are  of uncertain cause. No revealing background chronic brain findings. 2. Significantly motion degraded Intracranial MRA: Very motion degraded and of limited utility other than documenting patency of major vessels. Electronically Signed   By: Marnee Spring M.D.   On: 09/18/2020 08:43   IR GASTROSTOMY TUBE MOD SED  Result Date: 09/22/2020 INDICATION: 65 year old with altered mental status and dysphagia. Request for gastrostomy tube placement. EXAM: PERCUTANEOUS GASTROSTOMY TUBE WITH FLUOROSCOPIC GUIDANCE Physician: Rachelle Hora. Lowella Dandy, MD MEDICATIONS: Ancef 2 g; Antibiotics were administered within 1 hour of the procedure. Glucagon 0.5 mg ANESTHESIA/SEDATION: Versed 2.0 mg IV; Fentanyl 100 mcg IV Moderate Sedation Time:  42 minutes The patient was continuously monitored during the procedure by the interventional radiology nurse under my direct supervision. FLUOROSCOPY TIME:  Fluoroscopy Time: 12 minutes, 6 seconds, 19 mGy CONTRAST:  20 mL Omnipaque 300 COMPLICATIONS: None immediate. PROCEDURE: Informed consent was obtained for a percutaneous gastrostomy tube. The patient was placed on the interventional table. An orogastric tube was placed with fluoroscopic guidance. The anterior abdomen was prepped and draped in sterile fashion. Maximal barrier sterile technique was utilized including caps, mask, sterile gowns, sterile gloves, sterile drape, hand hygiene and skin antiseptic. Stomach was inflated with air through the orogastric tube. The skin and subcutaneous tissues were anesthetized with 1% lidocaine. A 17 gauge needle was directed into the distended stomach with fluoroscopic  guidance. A wire was advanced into the stomach and a T fastener was deployed. A 9-French vascular sheath was placed and the orogastric tube was snared using a Gooseneck snare device. At this time, the patient was clenching her teeth and we could not open her mouth in order to place the pull-through gastrostomy tube. As a result, we converted to a push through gastrostomy tube. Skin around the 9 French drain was anesthetized with 1% lidocaine. Using fluoroscopic guidance, 2 additional T-fasteners were deployed. The T fastener associated with the 9 French sheath was cut. The 9 French sheath was removed and dilated up to an 18 [place] peel-away sheath. A 16 French Entuit gastrostomy tube was advanced over the wire and through the peel-away sheath. The balloon was inflated with approximately 8 mL of saline. Peel-away sheath and wire were removed. Contrast injection confirmed placement in the stomach. Fluoroscopic images were obtained for documentation. The gastrostomy tube was flushed with normal saline. FINDINGS: 27 French balloon retention gastrostomy tube is positioned in the stomach. Total of 3 T-fasteners were deployed and there are still 2 T-fasteners intact. Contrast injection confirmed placement in the stomach. IMPRESSION: 1. Successful placement of a percutaneous gastrostomy tube using fluoroscopy. This is a balloon retention gastrostomy tube. 2. There are 2 remaining T-fasteners in place. Plan to cut the T-fasteners in 7-10 days. Electronically Signed   By: Richarda Overlie M.D.   On: 09/22/2020 12:07   US Carotid Bilateral (at [facility] and [facility] only)  Result Date: 09/18/2020 CLINICAL DATA:  Altered mental status Hypertension Stroke EXAM: BILATERAL CAROTID DUPLEX ULTRASOUND TECHNIQUE: Wallace Cullens scale imaging, color Doppler and duplex ultrasound were performed of bilateral carotid and vertebral arteries in the neck. COMPARISON:  None. FINDINGS: Criteria: Quantification of carotid stenosis is based on velocity parameters that  correlate the residual internal carotid diameter with NASCET-based stenosis levels, using the diameter of the distal internal carotid lumen as the denominator for stenosis measurement. The following velocity measurements were obtained: RIGHT ICA: 156/29 cm/sec CCA: 77/11 cm/sec SYSTOLIC ICA/CCA RATIO:  2.0 ECA: 173 cm/sec LEFT ICA: 83/18 cm/sec CCA: 85/14 cm/sec SYSTOLIC ICA/CCA RATIO:  1.0  ECA: 92 cm/sec RIGHT CAROTID ARTERY: Heterogeneous calcified and noncalcified plaque of the distal common and proximal internal carotid arteries with velocity indicative of 50-69% stenosis. RIGHT VERTEBRAL ARTERY:  Antegrade flow. LEFT CAROTID ARTERY: Heterogeneous calcified and noncalcified plaque of the distal common and proximal internal carotid arteries with velocity parameters indicative of less than 50% stenosis. LEFT VERTEBRAL ARTERY:  Antegrade flow. IMPRESSION: 1. 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 2. Less than 50% stenosis of the left internal carotid artery. 3. Long segment shadowing plaque present in both common and internal carotid arteries, which can obscure higher velocities. Further evaluation with MRI or CT of the neck should be performed for more precise evaluation of degree of stenosis. Electronically Signed   By: Acquanetta Belling M.D.   On: 09/18/2020 10:01   DG Chest Portable 1 View  Result Date: 09/17/2020 CLINICAL DATA:  Altered mental status EXAM: PORTABLE CHEST 1 VIEW COMPARISON:  None FINDINGS: There is hyperinflation of the lungs compatible with COPD. Heart and mediastinal contours are within normal limits. No focal opacities or effusions. No acute bony abnormality. IMPRESSION: COPD.  No active disease. Electronically Signed   By: Charlett Nose M.D.   On: 09/17/2020 23:02   ECHOCARDIOGRAM COMPLETE  Result Date: 09/18/2020    ECHOCARDIOGRAM REPORT   Patient Name:   [facility] Date of Exam: 09/18/2020 Medical Rec #:  [id number]       Height: Accession #:    [id number]      Weight:  Date of Birth:  February 20, 1956       BSA: Patient Age:    64 years        BP:           213/100 mmHg Patient Gender: F               HR:           59 bpm. [facility] Procedure: 2D Echo, Cardiac Doppler and Color Doppler Indications:    Stroke  History:        Patient has no prior history of Echocardiogram examinations.                 Stroke; Risk Factors:Hypertension. Height and Weight not                 available.  Sonographer:    Mikki Harbor Referring Phys: [id number] ASIA B ZIERLE-GHOSH  Sonographer Comments: Height and Weight not available IMPRESSIONS  1. Left ventricular ejection fraction, by estimation, is 60 to 65%. The left ventricle has normal function. The left ventricle has no regional wall motion abnormalities. There is mild left ventricular hypertrophy. Left ventricular diastolic parameters were normal.  2. Right ventricular systolic function is normal. The right ventricular size is normal. Tricuspid regurgitation signal is inadequate for assessing PA pressure.  3. The mitral valve is grossly normal. Trivial mitral valve regurgitation.  4. The aortic valve is tricuspid. Aortic valve regurgitation is not visualized. Mild aortic valve sclerosis is present, with no evidence of aortic valve stenosis. Aortic valve mean gradient measures 3.0 mmHg.  5. The inferior vena cava is normal in size with greater than 50% respiratory variability, suggesting right atrial pressure of 3 mmHg. FINDINGS  Left Ventricle: Left ventricular ejection fraction, by estimation, is 60 to 65%. The left ventricle has normal function. The left ventricle has no regional wall motion abnormalities. The left ventricular internal cavity size was normal in size. There is  mild left  ventricular hypertrophy. Left ventricular diastolic parameters were normal. Right Ventricle: The right ventricular size is normal. No increase in right ventricular wall thickness. Right ventricular systolic function is normal. Tricuspid  regurgitation signal is inadequate for assessing PA pressure. Left Atrium: Left atrial size was normal in size. Right Atrium: Right atrial size was normal in size. Pericardium: There is no evidence of pericardial effusion. Presence of pericardial fat pad. Mitral Valve: The mitral valve is grossly normal. Mild mitral annular calcification. Trivial mitral valve regurgitation. MV peak gradient, 2.7 mmHg. The mean mitral valve gradient is 1.0 mmHg. Tricuspid Valve: The tricuspid valve is grossly normal. Tricuspid valve regurgitation is trivial. Aortic Valve: The aortic valve is tricuspid. There is mild aortic valve annular calcification. Aortic valve regurgitation is not visualized. Mild aortic valve sclerosis is present, with no evidence of aortic valve stenosis. Aortic valve mean gradient measures 3.0 mmHg. Aortic valve peak gradient measures 5.7 mmHg. Aortic valve area, by VTI measures 2.71 cm. Pulmonic Valve: The pulmonic valve was grossly normal. Pulmonic valve regurgitation is trivial. Aorta: The aortic root is normal in size and structure. Venous: The inferior vena cava is normal in size with greater than 50% respiratory variability, suggesting right atrial pressure of 3 mmHg. IAS/Shunts: The interatrial septum appears to be lipomatous. No atrial level shunt detected by color flow Doppler.  LEFT VENTRICLE PLAX 2D LVIDd:         4.38 cm  Diastology LVIDs:         2.91 cm  LV e' medial:    7.10 cm/s LV PW:         1.04 cm  LV E/e' medial:  11.9 LV IVS:        1.06 cm  LV e' lateral:   7.62 cm/s LVOT diam:     2.00 cm  LV E/e' lateral: 11.0 LV SV:         90 LVOT Area:     3.14 cm  RIGHT VENTRICLE RV Basal diam:  3.37 cm RV Mid diam:    3.06 cm RV S prime:     10.20 cm/s TAPSE (M-mode): 2.3 cm LEFT ATRIUM             RIGHT ATRIUM LA diam:        3.00 cm RA Area:     13.30 cm LA Vol (A2C):   49.5 ml RA Volume:   31.30 ml LA Vol (A4C):   28.7 ml LA Biplane Vol: 40.5 ml  AORTIC VALVE AV Area (Vmax):    2.85 cm AV  Area (Vmean):   2.55 cm AV Area (VTI):     2.71 cm AV Vmax:           119.00 cm/s AV Vmean:          86.100 cm/s AV VTI:            0.330 m AV Peak Grad:      5.7 mmHg AV Mean Grad:      3.0 mmHg LVOT Vmax:         108.00 cm/s LVOT Vmean:        69.800 cm/s LVOT VTI:          0.285 m LVOT/AV VTI ratio: 0.86  AORTA Ao Root diam: 3.30 cm MITRAL VALVE MV Area (PHT): 3.15 cm    SHUNTS MV Area VTI:   2.54 cm    Systemic VTI:  0.29 m MV Peak grad:  2.7 mmHg    Systemic Diam:  2.00 cm MV Mean grad:  1.0 mmHg MV Vmax:       0.81 m/s MV Vmean:      41.2 cm/s MV Decel Time: 241 msec MV E velocity: 84.20 cm/s MV A velocity: 72.70 cm/s MV E/A ratio:  1.16 Nona Dell MD Electronically signed by Nona Dell MD Signature Date/Time: 09/18/2020/2:03:27 PM    Final         Discharge Exam: Vitals:   09/25/20 2034 09/26/20 0556  BP: 127/62 (!) 161/70  Pulse: 65 67  Resp:    Temp: 98.3 F (36.8 C)   SpO2: 95% 96%   Vitals:   09/25/20 1348 09/25/20 1430 09/25/20 2034 09/26/20 0556  BP: (!) 111/59 132/66 127/62 (!) 161/70  Pulse: 68  65 67  Resp: 16     Temp: 98 F (36.7 C)  98.3 F (36.8 C)   TempSrc: Oral  Oral   SpO2: 100%  95% 96%  Weight:      Height:        General: Pt is alert, awake, not in acute distress Cardiovascular: RRR, S1/S2 +, no rubs, no gallops Respiratory: CTA bilaterally, no wheezing, no rhonchi Abdominal: Soft, NT, ND, bowel sounds + Extremities: no edema, no cyanosis   The results of significant diagnostics from this hospitalization (including imaging, microbiology, ancillary and laboratory) are listed below for reference.    Significant Diagnostic Studies: CT ABDOMEN WO CONTRAST  Result Date: 09/21/2020 CLINICAL DATA:  Evaluate anatomy prior to potential percutaneous gastrostomy tube placement. EXAM: CT ABDOMEN WITHOUT CONTRAST TECHNIQUE: Multidetector CT imaging of the abdomen was performed following the standard protocol without IV contrast. COMPARISON:  None.  FINDINGS: The lack of intravenous contrast limits the ability to evaluate solid abdominal organs. Lower chest: Limited visualization of the lower thorax demonstrates minimal dependent subpleural atelectasis. Normal heart size. No pericardial effusion. Hepatobiliary: Normal hepatic contour. Normal noncontrast appearance of the gallbladder given degree distention. No radiopaque gallstones. There is a trace amount of perihepatic ascites. Pancreas: Normal noncontrast appearance of the pancreas. Spleen: Normal noncontrast appearance of the spleen. 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 Normal noncontrast appearance of the right kidney. No renal stones. No right-sided urinary obstruction. Normal noncontrast appearance the bilateral adrenal glands. The urinary bladder was not imaged. Stomach/Bowel: The anterior wall the stomach is well apposed against the ventral wall of the abdomen without interposition of the hepatic parenchyma or the transverse colon. Moderate colonic stool burden without evidence of enteric obstruction. No pneumoperitoneum, pneumatosis or portal venous gas. Vascular/Lymphatic: Large amount of irregular calcified atherosclerotic plaque within a bilobed ectatic abdominal aorta with dominant caudal component measuring 2.6 cm and maximal oblique short axis coronal diameter (coronal image 33, series 5). No bulky retroperitoneal or mesenteric lymphadenopathy. Other: Regional soft tissues appear normal. Musculoskeletal: No acute or aggressive osseous abnormalities. Mild DDD of L4-L5 with disc space height loss, endplate irregularity and sclerosis. IMPRESSION: 1. Gastric anatomy amenable to potential percutaneous gastrostomy tube placement as indicated. 2. 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 3. Mildly ectatic abdominal aorta measuring 2.6 cm  in maximal diameter. Recommend follow-up aortic ultrasound in 5 years. This recommendation follows ACR consensus guidelines: White Paper of the ACR Incidental Findings Committee II on Vascular Findings. J Am Coll Radiol 2013; 10:789-794. 4. Aortic aneurysm NOS (ICD10-I71.9). Electronically Signed   By: Simonne Come M.D.   On: 09/21/2020 09:36   CT Head Wo Contrast  Result Date: 09/17/2020 CLINICAL DATA:  Mental status change, found this afternoon on couch EXAM: CT HEAD WITHOUT CONTRAST TECHNIQUE: Contiguous axial images were obtained from the base of the skull through the vertex without intravenous contrast. COMPARISON:  None. FINDINGS: Brain: Foci of hypoattenuation present in the right internal capsule as as a separate focus in the right caudate and posterior limb of the left internal capsule could reflect sequela of age-indeterminate lacunar type infarcts difficult to fully assess given a background of more diffuse patchy white matter hypoattenuation typically indicative of microvascular angiopathy in a patient of this age. Additional hypoattenuation in the pons and brainstem is more nonspecific given streak artifact across skull base. Symmetric prominence of the ventricles, cisterns and sulci compatible with parenchymal volume loss. No hyperdense hemorrhage. No mass effect or midline shift. No extra-axial collection. Scattered benign dural calcifications. Vascular: Atherosclerotic calcification of the carotid siphons. No hyperdense vessel. Skull: No calvarial fracture or suspicious osseous lesion. No scalp swelling or hematoma. Sinuses/Orbits: Paranasal sinuses and mastoid air cells are predominantly clear. Other: Included orbital structures are unremarkable. 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 Could be further characterized with MR imaging as warranted. No other acute intracranial abnormality. These results were called by telephone at the time of interpretation on 09/17/2020 at 11:45 pm to provider Benjiman Core , who verbally acknowledged these results. Electronically Signed   By: Kreg Shropshire M.D.   On: 09/17/2020 23:45   MR ANGIO HEAD WO CONTRAST  Result Date: 09/18/2020 CLINICAL DATA:  Altered mental status. EXAM: MRI HEAD WITHOUT CONTRAST MRA HEAD WITHOUT CONTRAST TECHNIQUE: Multiplanar, multi-echo pulse sequences of the brain and surrounding structures were acquired without intravenous contrast. Angiographic images of the Circle of Willis were acquired using MRA technique without intravenous contrast. COMPARISON:  No pertinent prior exam. FINDINGS: MRI HEAD FINDINGS Brain: Restricted diffusion in the bilateral thalamus, bilateral internal capsule, left centrum semiovale, and right subcortical frontal. These have the appearance of acute infarcts. Mild small vessel ischemic type change in the pons and hemispheric white matter. No evidence of hemorrhage, hydrocephalus, or collection. Vascular: Preserved flow voids. Skull and upper cervical spine: Normal marrow signal Sinuses/Orbits: Negative Other: Significant and progressive motion artifact MRA HEAD FINDINGS Very motion degraded. Carotid, vertebral, and basilar arteries and major branches are patent with gross symmetry. IMPRESSION: Brain MRI: 1. 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 These are not in a unified arterial or venous distribution and are of uncertain cause. No revealing background chronic brain findings. 2. Significantly motion degraded Intracranial MRA: Very motion degraded and of limited utility other than documenting patency of major vessels. Electronically Signed   By: Marnee Spring M.D.   On: 09/18/2020 08:43   MR BRAIN WO CONTRAST  Result Date: 09/18/2020 CLINICAL DATA:  Altered mental status. EXAM: MRI HEAD WITHOUT CONTRAST MRA HEAD WITHOUT CONTRAST TECHNIQUE: Multiplanar, multi-echo pulse sequences of the brain and surrounding structures were acquired without intravenous contrast. Angiographic images of the Circle of Willis were acquired using MRA technique without intravenous contrast. COMPARISON:  No pertinent prior exam. FINDINGS: MRI HEAD FINDINGS Brain: Restricted diffusion in the bilateral thalamus, bilateral internal capsule, left centrum semiovale, and right subcortical frontal. These have the appearance of acute  infarcts. Mild small vessel ischemic type change in the pons and hemispheric white matter. No evidence of hemorrhage, hydrocephalus, or collection. Vascular: Preserved flow voids. Skull and upper cervical spine: Normal marrow signal Sinuses/Orbits: Negative Other: Significant and progressive motion artifact MRA HEAD FINDINGS Very motion degraded. Carotid, vertebral, and basilar arteries and major branches are patent with gross symmetry. IMPRESSION: Brain MRI: 1. 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 These are not in a unified arterial or venous distribution and are of uncertain cause. No revealing background chronic brain findings. 2. Significantly motion degraded Intracranial MRA: Very motion degraded and of limited utility other than documenting patency of major vessels. Electronically Signed   By: Marnee SpringJonathon  Watts M.D.   On: 09/18/2020 08:43   IR GASTROSTOMY TUBE MOD SED  Result Date: 09/22/2020 INDICATION: 65 year old with altered mental status and dysphagia. Request for gastrostomy tube placement. EXAM: PERCUTANEOUS GASTROSTOMY TUBE WITH FLUOROSCOPIC GUIDANCE Physician: Rachelle HoraAdam R. Lowella DandyHenn, MD MEDICATIONS: Ancef 2 g; Antibiotics were administered within 1 hour of the  procedure. Glucagon 0.5 mg ANESTHESIA/SEDATION: Versed 2.0 mg IV; Fentanyl 100 mcg IV Moderate Sedation Time:  42 minutes The patient was continuously monitored during the procedure by the interventional radiology nurse under my direct supervision. FLUOROSCOPY TIME:  Fluoroscopy Time: 12 minutes, 6 seconds, 19 mGy CONTRAST:  20 mL Omnipaque 300 COMPLICATIONS: None immediate. PROCEDURE: Informed consent was obtained for a percutaneous gastrostomy tube. The patient was placed on the interventional table. An orogastric tube was placed with fluoroscopic guidance. The anterior abdomen was prepped and draped in sterile fashion. Maximal barrier sterile technique was utilized including caps, mask, sterile gowns, sterile gloves, sterile drape, hand hygiene and skin antiseptic. Stomach was inflated with air through the orogastric tube. The skin and subcutaneous tissues were anesthetized with 1% lidocaine. A 17 gauge needle was directed into the distended stomach with fluoroscopic guidance. A wire was advanced into the stomach and a T fastener was deployed. A 9-French vascular sheath was placed and the orogastric tube was snared using a Gooseneck snare device. At this time, the patient was clenching her teeth and we could not open her mouth in order to place the pull-through gastrostomy tube. As a result, we converted to a push through gastrostomy tube. Skin around the 9 French drain was anesthetized with 1% lidocaine. Using fluoroscopic guidance, 2 additional T-fasteners were deployed. The T fastener associated with the 9 French sheath was cut. The 9 French sheath was removed and dilated up to an 18 [place] peel-away sheath. A 16 French Entuit gastrostomy tube was advanced over the wire and through the peel-away sheath. The balloon was inflated with approximately 8 mL of saline. Peel-away sheath and wire were removed. Contrast injection confirmed placement in the stomach. Fluoroscopic images were obtained for documentation.  The gastrostomy tube was flushed with normal saline. FINDINGS: 7518 French balloon retention gastrostomy tube is positioned in the stomach. Total of 3 T-fasteners were deployed and there are still 2 T-fasteners intact. Contrast injection confirmed placement in the stomach. IMPRESSION: 1. Successful placement of a percutaneous gastrostomy tube using fluoroscopy. This is a balloon retention gastrostomy tube. 2. There are 2 remaining T-fasteners in place. Plan to cut the T-fasteners in 7-10 days. Electronically Signed   By: Richarda OverlieAdam  Henn M.D.   On: 09/22/2020 12:07   [facility]  Result Date: 09/18/2020 CLINICAL DATA:  Altered mental status Hypertension Stroke EXAM: BILATERAL CAROTID DUPLEX ULTRASOUND TECHNIQUE:  Gray scale imaging, color Doppler and duplex ultrasound were performed of bilateral carotid and vertebral arteries in the neck. COMPARISON:  None. FINDINGS: Criteria: Quantification of carotid stenosis is based on velocity parameters that correlate the residual internal carotid diameter with NASCET-based stenosis levels, using the diameter of the distal internal carotid lumen as the denominator for stenosis measurement. The following velocity measurements were obtained: RIGHT ICA: 156/29 cm/sec CCA: 77/11 cm/sec SYSTOLIC ICA/CCA RATIO:  2.0 ECA: 173 cm/sec LEFT ICA: 83/18 cm/sec CCA: 85/14 cm/sec SYSTOLIC ICA/CCA RATIO:  1.0 ECA: 92 cm/sec RIGHT CAROTID ARTERY: Heterogeneous calcified and noncalcified plaque of the distal common and proximal internal carotid arteries with velocity indicative of 50-69% stenosis. RIGHT VERTEBRAL ARTERY:  Antegrade flow. LEFT CAROTID ARTERY: Heterogeneous calcified and noncalcified plaque of the distal common and proximal internal carotid arteries with velocity parameters indicative of less than 50% stenosis. LEFT VERTEBRAL ARTERY:  Antegrade flow. IMPRESSION: 1. 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 2. Less than 50% stenosis of the  left internal carotid artery. 3. Long segment shadowing plaque present in both common and internal carotid arteries, which can obscure higher velocities. Further evaluation with MRI or CT of the neck should be performed for more precise evaluation of degree of stenosis. Electronically Signed   By: Acquanetta Belling M.D.   On: 09/18/2020 10:01   DG Chest Portable 1 View  Result Date: 09/17/2020 CLINICAL DATA:  Altered mental status EXAM: PORTABLE CHEST 1 VIEW COMPARISON:  None FINDINGS: There is hyperinflation of the lungs compatible with COPD. Heart and mediastinal contours are within normal limits. No focal opacities or effusions. No acute bony abnormality. IMPRESSION: COPD.  No active disease. Electronically Signed   By: Charlett Nose M.D.   On: 09/17/2020 23:02   ECHOCARDIOGRAM COMPLETE  Result Date: 09/18/2020    ECHOCARDIOGRAM REPORT   Patient Name:   [facility] Date of Exam: 09/18/2020 Medical Rec #:  [id number]       Height: Accession #:    [id number]      Weight: Date of Birth:  1955-05-27       BSA: Patient Age:    64 years        BP:           213/100 mmHg Patient Gender: F               HR:           59 bpm. [facility] Procedure: 2D Echo, Cardiac Doppler and Color Doppler Indications:    Stroke  History:        Patient has no prior history of Echocardiogram examinations.                 Stroke; Risk Factors:Hypertension. Height and Weight not                 available.  Sonographer:    Mikki Harbor Referring Phys: [id number] ASIA B ZIERLE-GHOSH  Sonographer Comments: Height and Weight not available IMPRESSIONS  1. Left ventricular ejection fraction, by estimation, is 60 to 65%. The left ventricle has normal function. The left ventricle has no regional wall motion abnormalities. There is mild left ventricular hypertrophy. Left ventricular diastolic parameters were normal.  2. Right ventricular systolic function is normal. The right ventricular size is normal. Tricuspid regurgitation  signal is inadequate for assessing PA pressure.  3. The mitral valve is grossly normal. Trivial mitral valve regurgitation.  4. The aortic valve is tricuspid.  Aortic valve regurgitation is not visualized. Mild aortic valve sclerosis is present, with no evidence of aortic valve stenosis. Aortic valve mean gradient measures 3.0 mmHg.  5. The inferior vena cava is normal in size with greater than 50% respiratory variability, suggesting right atrial pressure of 3 mmHg. FINDINGS  Left Ventricle: Left ventricular ejection fraction, by estimation, is 60 to 65%. The left ventricle has normal function. The left ventricle has no regional wall motion abnormalities. The left ventricular internal cavity size was normal in size. There is  mild left ventricular hypertrophy. Left ventricular diastolic parameters were normal. Right Ventricle: The right ventricular size is normal. No increase in right ventricular wall thickness. Right ventricular systolic function is normal. Tricuspid regurgitation signal is inadequate for assessing PA pressure. Left Atrium: Left atrial size was normal in size. Right Atrium: Right atrial size was normal in size. Pericardium: There is no evidence of pericardial effusion. Presence of pericardial fat pad. Mitral Valve: The mitral valve is grossly normal. Mild mitral annular calcification. Trivial mitral valve regurgitation. MV peak gradient, 2.7 mmHg. The mean mitral valve gradient is 1.0 mmHg. Tricuspid Valve: The tricuspid valve is grossly normal. Tricuspid valve regurgitation is trivial. Aortic Valve: The aortic valve is tricuspid. There is mild aortic valve annular calcification. Aortic valve regurgitation is not visualized. Mild aortic valve sclerosis is present, with no evidence of aortic valve stenosis. Aortic valve mean gradient measures 3.0 mmHg. Aortic valve peak gradient measures 5.7 mmHg. Aortic valve area, by VTI measures 2.71 cm. Pulmonic Valve: The pulmonic valve was grossly normal.  Pulmonic valve regurgitation is trivial. Aorta: The aortic root is normal in size and structure. Venous: The inferior vena cava is normal in size with greater than 50% respiratory variability, suggesting right atrial pressure of 3 mmHg. IAS/Shunts: The interatrial septum appears to be lipomatous. No atrial level shunt detected by color flow Doppler.  LEFT VENTRICLE PLAX 2D LVIDd:         4.38 cm  Diastology LVIDs:         2.91 cm  LV e' medial:    7.10 cm/s LV PW:         1.04 cm  LV E/e' medial:  11.9 LV IVS:        1.06 cm  LV e' lateral:   7.62 cm/s LVOT diam:     2.00 cm  LV E/e' lateral: 11.0 LV SV:         90 LVOT Area:     3.14 cm  RIGHT VENTRICLE RV Basal diam:  3.37 cm RV Mid diam:    3.06 cm RV S prime:     10.20 cm/s TAPSE (M-mode): 2.3 cm LEFT ATRIUM             RIGHT ATRIUM LA diam:        3.00 cm RA Area:     13.30 cm LA Vol (A2C):   49.5 ml RA Volume:   31.30 ml LA Vol (A4C):   28.7 ml LA Biplane Vol: 40.5 ml  AORTIC VALVE AV Area (Vmax):    2.85 cm AV Area (Vmean):   2.55 cm AV Area (VTI):     2.71 cm AV Vmax:           119.00 cm/s AV Vmean:          86.100 cm/s AV VTI:            0.330 m AV Peak Grad:      5.7 mmHg AV Mean  Grad:      3.0 mmHg LVOT Vmax:         108.00 cm/s LVOT Vmean:        69.800 cm/s LVOT VTI:          0.285 m LVOT/AV VTI ratio: 0.86  AORTA Ao Root diam: 3.30 cm MITRAL VALVE MV Area (PHT): 3.15 cm    SHUNTS MV Area VTI:   2.54 cm    Systemic VTI:  0.29 m MV Peak grad:  2.7 mmHg    Systemic Diam: 2.00 cm MV Mean grad:  1.0 mmHg MV Vmax:       0.81 m/s MV Vmean:      41.2 cm/s MV Decel Time: 241 msec MV E velocity: 84.20 cm/s MV A velocity: 72.70 cm/s MV E/A ratio:  1.16 Nona Dell MD Electronically signed by Nona Dell MD Signature Date/Time: 09/18/2020/2:03:27 PM    Final     Microbiology: Recent Results (from the past 240 hour(s))  Culture, blood (routine x 2)     Status: None   Collection Time: 09/17/20 10:00 PM   Specimen: Right Antecubital; Blood   Result Value Ref Range Status   Specimen Description RIGHT ANTECUBITAL  Final   Special Requests   Final    BOTTLES DRAWN AEROBIC AND ANAEROBIC Blood Culture results may not be optimal due to an excessive volume of blood received in culture bottles   Culture   Final    NO GROWTH 5 DAYS Performed at [facility], [address], [place], [place] [address]    Report Status 09/22/2020 FINAL  Final  Resp Panel by RT-PCR (Flu A&B, Covid) Nasopharyngeal Swab     Status: None   Collection Time: 09/17/20 10:35 PM   Specimen: Nasopharyngeal Swab; Nasopharyngeal(NP) swabs in vial transport medium  Result Value Ref Range Status   SARS Coronavirus 2 by RT PCR NEGATIVE NEGATIVE Final    Comment: (NOTE) SARS-CoV-2 target nucleic acids are NOT DETECTED.  The SARS-CoV-2 RNA is generally detectable in upper respiratory specimens during the acute phase of infection. The lowest concentration of SARS-CoV-2 viral copies this assay can detect is 138 copies/mL. A negative result does not preclude SARS-Cov-2 infection and should not be used as the sole basis for treatment or other patient management decisions. A negative result may occur with  improper specimen collection/handling, submission of specimen other than nasopharyngeal swab, presence of viral mutation(s) within the areas targeted by this assay, and inadequate number of viral copies(<138 copies/mL). A negative result must be combined with clinical observations, patient history, and epidemiological information. The expected result is Negative.  Fact Sheet for Patients:  BloggerCourse.com  Fact Sheet for Healthcare Providers:  SeriousBroker.it  This test is no t yet approved or cleared by the [place] FDA and  has been authorized for detection and/or diagnosis of SARS-CoV-2 by FDA under an Emergency Use Authorization (EUA). This EUA will remain  in effect (meaning this test can be used)  for the duration of the COVID-19 declaration under Section 564(b)(1) of the Act, 21 U.S.C.section 360bbb-3(b)(1), unless the authorization is terminated  or revoked sooner.       Influenza A by PCR NEGATIVE NEGATIVE Final   Influenza B by PCR NEGATIVE NEGATIVE Final    Comment: (NOTE) The Xpert Xpress SARS-CoV-2/FLU/RSV plus assay is intended as an aid in the diagnosis of influenza from Nasopharyngeal swab specimens and should not be used as a sole basis for treatment. Nasal washings and aspirates are unacceptable for Xpert  Xpress SARS-CoV-2/FLU/RSV testing.  Fact Sheet for Patients: BloggerCourse.com  Fact Sheet for Healthcare Providers: SeriousBroker.it  This test is not yet approved or cleared by the [place] FDA and has been authorized for detection and/or diagnosis of SARS-CoV-2 by FDA under an Emergency Use Authorization (EUA). This EUA will remain in effect (meaning this test can be used) for the duration of the COVID-19 declaration under Section 564(b)(1) of the Act, 21 U.S.C. section 360bbb-3(b)(1), unless the authorization is terminated or revoked.  Performed at [facility], [address] [place], [place] [address]   Culture, blood (routine x 2)     Status: None   Collection Time: 09/18/20 12:01 AM   Specimen: BLOOD LEFT ARM  Result Value Ref Range Status   Specimen Description BLOOD LEFT ARM  Final   Special Requests   Final    BOTTLES DRAWN AEROBIC AND ANAEROBIC Blood Culture adequate volume   Culture   Final    NO GROWTH 5 DAYS Performed at [facility], [address], [place], [place] [address]    Report Status 09/23/2020 FINAL  Final     Labs: Basic Metabolic Panel: Recent Labs  Lab 09/21/20 0339 09/24/20 0426  NA 137 136  K 2.9* 3.7  CL 100 102  CO2 25 27  GLUCOSE 110* 137*  BUN 8 11  CREATININE 0.63 0.62  CALCIUM 8.6* 8.8*   Liver Function Tests: No results for input(s): AST, ALT,  ALKPHOS, BILITOT, PROT, ALBUMIN in the last 168 hours. No results for input(s): LIPASE, AMYLASE in the last 168 hours. Recent Labs  Lab 09/21/20 0339  AMMONIA 26   CBC: Recent Labs  Lab 09/21/20 0339 09/24/20 0426  WBC 7.1 7.2  HGB 13.5 11.7*  HCT 40.2 35.0*  MCV 95.3 94.1  PLT 269 274   Cardiac Enzymes: No results for input(s): CKTOTAL, CKMB, CKMBINDEX, TROPONINI in the last 168 hours. BNP: Invalid input(s): POCBNP CBG: No results for input(s): GLUCAP in the last 168 hours.  Time coordinating discharge:  36 minutes  Signed:  Catarina Hartshorn, DO [facility] Pager: [phone] 09/26/2020, 1:41 PM

## 2020-09-26 NOTE — Care Management Important Message (Signed)
Important Message  Patient Details  Name: Kendra Lee MRN: [id number] Date of Birth: 10-31-1955   Medicare Important Message Given:  Yes     Corey Harold 09/26/2020, 12:07 PM

## 2020-09-26 NOTE — Progress Notes (Addendum)
Nutrition Follow up  DOCUMENTATION CODES:   Underweight  INTERVENTION:  -Continue Osmolite 1.2  @ 55 ml/hr  via PEG at goal rate of 55 ml/hr.   Tube feeding regimen provides 1584 kcal (100% of needs), 73 grams of protein, and 1082 ml of H2O.    Free water flushes 30 ml before and after meds and 200 ml -TID per tube.   Need current weight for assessment   NUTRITION DIAGNOSIS:   Inadequate oral intake related to inability to eat (recent stroke) as evidenced by NPO status. -addressed by enteral feeding  GOAL:  Patient will meet greater than or equal to 90% of their needs -MET  MONITOR:  Labs, Weight trends, Skin (plan of care regarding nutrition support)   ASSESSMENT: Patient is an underweight 65 yo female with acute encephalopathy, acute ischemic stroke.   6/29- Palliative Care addressed [place] with son.   ST evaluation today reviewed. Dysphagia-NPO for now. Risk for aspiration PNA. No family at bedside during RD visit. Talked with her nurse. If tube feeding becomes an option. Recommendations provided above. If expected to be short-term 6 weeks or less consider NGT placement.   7/1 PEG tube placed by IR  7/2 Enteral feeding initiated- Osmolite 1.2 (continuous) with goal rate 55 ml/hr.  7/5 Nursing at bedside during RD visit. She is tolerating tube feeds at goal, up in chair and more awake than during last visit. Disposition-short term rehab- planning in process. Palliative following. Continue current tube feeding regimen. Last BM????  Medications reviewed and include: B-1, Lipitor.  Labs: BMP Latest Ref Rng & Units 09/24/2020 09/21/2020 09/18/2020  Glucose 70 - 99 mg/dL 137(H) 110(H) 149(H)  BUN 8 - 23 mg/dL _0 Creatinine 0.44 - 1.00 mg/dL 0.62 0.63 0.66  Sodium 135 - 145 mmol/L 136 137 140  Potassium 3.5 - 5.1 mmol/L 3.7 2.9(L) 3.6  Chloride 98 - 111 mmol/L 102 100 106  CO2 22 - 32 mmol/L _1 Calcium 8.9 - 10.3 mg/dL 8.8(L) 8.6(L) 9.2     Diet Order:   Diet  Order             Diet NPO time specified Except for: Sips with Meds  Diet effective midnight                   EDUCATION NEEDS:   Not appropriate for education at this time  Skin:  Skin Assessment: Reviewed RN Assessment  Last BM:  6/30 type 6  Height:   Ht Readings from Last 1 Encounters:  09/18/20 5' 1" (1.549 m)    Weight:   Wt Readings from Last 1 Encounters:  09/18/20 42.1 kg    Ideal Body Weight:   48 kg  BMI:  Body mass index is 17.54 kg/m.  Estimated Nutritional Needs:   Kcal:  9774-1423  Protein:  65-72 gr  Fluid:  >1200 ml daily   Colman Cater MS,RD,CSG,LDN Contact: Shea Evans

## 2020-09-26 NOTE — Progress Notes (Signed)
Palliative: Kendra Lee is lying quietly in bed.  She appears acutely/chronically ill and very frail.  She will briefly make but not keep eye contact unless I asked her to do so.  She is alert, oriented to person and situation.  I believe that she can make her basic needs known.  There is no family at bedside at this time.  Overall Kendra Lee has made improvements after her stroke.  It remains to be seen how much she will be able to recover.  At this point family is agreeable to short-term rehab and they have accepted a bed at [place].  She remains full scope/full code.  Outpatient palliative services to follow.  Conference with attending, bedside nursing staff, transition of care team related to patient condition, needs, goals of care, disposition.  Plan: Full scope/full code, PEG tube for nutrition, short-term rehab, outpatient palliative services to follow.  25 minutes Lillia Carmel, NP Palliative medicine team Team phone [phone] Greater than 50% of this time was spent counseling and coordinating care related to the above assessment and plan.

## 2020-09-26 NOTE — Progress Notes (Signed)
Occupational Therapy Treatment Patient Details Name: Kendra Lee MRN: [id number] DOB: September 06, 1955 Today's Date: 09/26/2020    History of present illness Kendra Lee  is a 65 y.o. female s/p CVA, admitted on 09/17/2020   OT comments  Pt agreeable to OT treatment. Pt able to complete supine to sit bed mobility with Min A. Pt leans to the R side without UE support or physical assist at EOB. Pt able to complete stand pivot transfer with Min to Mod A. Assist needed to move AD during pivot portion of transfer. Pt donned 1 L O2 during session. No active movement noted in R UE this date when pt was requested to do so. Pt discharge recommendation remains appropriate.   Follow Up Recommendations  SNF    Equipment Recommendations  None recommended by OT          Precautions / Restrictions Precautions Precautions: Fall Restrictions Weight Bearing Restrictions: No       Mobility Bed Mobility Overal bed mobility: Needs Assistance Bed Mobility: Supine to Sit     Supine to sit: Min assist;HOB elevated     General bed mobility comments: slow labored movement; assist to scoot forward to place feet on the floor    Transfers Overall transfer level: Needs assistance Equipment used: Rolling walker (2 wheeled) Transfers: Sit to/from [employer] Sit to Stand: Min assist;Mod assist Stand pivot transfers: Min assist;Mod assist       General transfer comment: Assist to move AD during transfer. Assist to place L UE on RW. Verbal cues to reach back with R UE prior to sitting.    Balance Overall balance assessment: Needs assistance Sitting-balance support: Feet supported;No upper extremity supported Sitting balance-Leahy Scale: Poor Sitting balance - Comments: seated EOB Postural control: Right lateral lean                                 ADL either performed or assessed with clinical judgement   ADL Overall ADL's : Needs assistance/impaired      Grooming: Oral care;Sitting Grooming Details (indicate cue type and reason): Pt wiping mouth with L UE using tissue.             Lower Body Dressing: Total assistance;Bed level Lower Body Dressing Details (indicate cue type and reason): doning socks at bed level Toilet Transfer: Moderate assistance;Stand-pivot;RW;Minimal assistance Toilet Transfer Details (indicate cue type and reason): simulated with bed to chair transfer           General ADL Comments: Pt limited in ADL independence largely due to RUE flaccidity and general lethargy                       Cognition Arousal/Alertness: Awake/alert Behavior During Therapy: Flat affect Overall Cognitive Status: Within Functional Limits for tasks assessed                                 General Comments: Pt able to whisper and nod head to answer questions.                          Pertinent Vitals/ Pain       Pain Assessment: No/denies pain  Frequency  Min 2X/week        Progress Toward Goals  OT Goals(current goals can now be found in the care plan section)  Progress towards OT goals: Progressing toward goals  Acute Rehab OT Goals Patient Stated Goal: none stated OT Goal Formulation: Patient unable to participate in goal setting Time For Goal Achievement: 10/03/20 Potential to Achieve Goals: Good ADL Goals Pt Will Perform Grooming: with supervision;sitting;standing Pt Will Perform Upper Body Dressing: with min assist;sitting;standing Pt Will Transfer to Toilet: with mod assist;stand pivot transfer;ambulating;regular height toilet;bedside commode Pt Will Perform Toileting - Clothing Manipulation and hygiene: with min assist;sitting/lateral leans;sit to/from stand Pt/caregiver will Perform Home Exercise Program: Increased ROM;Increased strength;Both right and left upper extremity;With Supervision;With  written HEP provided  Plan Discharge plan remains appropriate                                    End of Session Equipment Utilized During Treatment: Oxygen  OT Visit Diagnosis: Muscle weakness (generalized) (M62.81)   Activity Tolerance Patient tolerated treatment well   Patient Left in chair;with call bell/phone within reach;with chair alarm set             Time: 1448-1856 OT Time Calculation (min): 20 min  Charges: OT General Charges $OT Visit: 1 Visit OT Treatments $Self Care/Home Management : 8-22 mins  Kendra Lee OT, MOT    Danie Chandler 09/26/2020, 11:31 AM

## 2020-09-27 NOTE — Progress Notes (Signed)
09/27/2020 10:02 AM  Pt was seen and examined by me today and remains stable for discharge today to SNF.  Please see dictated DC summary dated 7/5 by Dr. Arbutus Leas.   Maryln Manuel, MD How to contact the [facility] Attending or Consulting provider 7A - 7P or covering provider during after hours 7P -7A, for this patient?  Check the care team in [facility] and look for a) attending/consulting TRH provider listed and b) the [facility] team listed Log into [email or web address] and use [place]'s universal password to access. If you do not have the password, please contact the hospital operator. Locate the [facility] provider you are looking for under [facility] and page to a number that you can be directly reached. If you still have difficulty reaching the provider, please page the [facility] (Director on Call) for the Hospitalists listed on amion for assistance.

## 2020-09-27 NOTE — Progress Notes (Signed)
   Balloon retention G tube was placed in IR 7/1. T tacs were placed per Dr Lowella Dandy note  Pt is set for DC today to SNF per Dr Maryln Manuel  Discussed case with Dr Archer Asa He approved T tacs removal  G tube is in place Using now-- no issues No sign of bleeding No sign of redness or infection   Removal performed at bedside Dressing applied

## 2020-09-28 DIAGNOSIS — I639 Cerebral infarction, unspecified: Secondary | ICD-10-CM | POA: Diagnosis not present

## 2020-09-28 DIAGNOSIS — Z7189 Other specified counseling: Secondary | ICD-10-CM | POA: Diagnosis not present

## 2020-09-28 DIAGNOSIS — Z515 Encounter for palliative care: Secondary | ICD-10-CM | POA: Diagnosis not present

## 2020-09-28 MED ORDER — SIMETHICONE 40 MG/0.6ML PO SUSP: 20.0000 mg | Freq: Four times a day (QID) | ORAL | Status: DC | PRN

## 2020-09-28 MED ORDER — FENTANYL CITRATE (PF) 100 MCG/2ML IJ SOLN
25.0000 ug | Freq: Once | INTRAMUSCULAR | Status: AC
Start: 2020-09-28 — End: 2020-09-28
  Administered 2020-09-28: 25 ug via INTRAVENOUS
  Filled 2020-09-28: qty 2

## 2020-09-28 NOTE — Progress Notes (Signed)
TRH night shift.  The nursing staff reported that the patient is having severe pain in his abdomen.  He is grabbing the PEG tube.  He is currently getting Osmolite 55 mL/h.  The tube was placed on 09/22/2020.  I have asked the staff to hold the Osmolite infusion and have order fentanyl 25 mcg IVP x1 dose.  Sanda Klein, MD

## 2020-09-28 NOTE — Progress Notes (Signed)
Occupational Therapy Treatment Patient Details Name: Kendra Lee MRN: [id number] DOB: 12-21-1955 Today's Date: 09/28/2020    History of present illness Kendra Lee  is a 65 y.o. female s/p CVA, admitted on 09/17/2020   OT comments  Pt status appears to be worsening. Pt required Mod A to maintain seated balance at EOB with support to R UE to provide weight bearing. Pt demonstrates poor postural stability at EOB as noted by anteriorly leaning after ~1 to 2 minutes of upright sitting. Pt requires mod to max A without AD for sit to stand. No trace movement noted in R UE. Pt will benefit from continued OT services to address deficit areas.   Follow Up Recommendations  SNF    Equipment Recommendations  None recommended by OT          Precautions / Restrictions Precautions Precautions: Fall Restrictions Weight Bearing Restrictions: No       Mobility Bed Mobility Overal bed mobility: Needs Assistance Bed Mobility: Supine to Sit;Sit to Supine     Supine to sit: Mod assist;Max assist;HOB elevated Sit to supine: Mod assist   General bed mobility comments: slow labored movement;    Transfers Overall transfer level: Needs assistance Equipment used: 1 person hand held assist (Therapist anterior providing support to torso.) Transfers: Sit to/from Stand Sit to Stand: Mod assist;Max assist         General transfer comment: This therapist providing support anteriorly of torso and blocking of knees. Able to remain standing for ~5 to 10 seconds.    Balance Overall balance assessment: Needs assistance Sitting-balance support: Feet supported;Single extremity supported Sitting balance-Leahy Scale: Poor Sitting balance - Comments: seated EOB Postural control: Right lateral lean (Anterior lean during extended sitting at EOB) Standing balance support: Single extremity supported;During functional activity Standing balance-Leahy Scale: Poor Standing balance comment: During sit to  stand with this therapist providing support anteriorly.                           ADL either performed or assessed with clinical judgement   ADL Overall ADL's : Needs assistance/impaired     Grooming: Wash/dry face;Sitting Grooming Details (indicate cue type and reason): Pt able to reach with L UE to face to wipe face once. Assist needed for more thorough wiping. Mod to max assist needed to maintain upright position at EOB when using R UE.             Lower Body Dressing: Maximal assistance;Bed level Lower Body Dressing Details (indicate cue type and reason): Pt able to pull up R sock that was partially put on for her. Total assist for L sock.                                       Cognition Arousal/Alertness: Awake/alert Behavior During Therapy: Flat affect Overall Cognitive Status: Within Functional Limits for tasks assessed                                 General Comments: Pt seemed to slightly nod this date. Flat affect. Grimacing at times in pain when scooted up in bed.                          Pertinent Vitals/ Pain  Pain Assessment: Faces Faces Pain Scale: Hurts even more Pain Location: unknown location during scooting to head of bed while supine. Pain Descriptors / Indicators: Grimacing Pain Intervention(s): Limited activity within patient's tolerance;Monitored during session;Repositioned                                                          Frequency  Min 2X/week        Progress Toward Goals  OT Goals(current goals can now be found in the care plan section)  Progress towards OT goals: Not progressing toward goals - comment  Acute Rehab OT Goals Patient Stated Goal: none stated OT Goal Formulation: Patient unable to participate in goal setting Time For Goal Achievement: 10/03/20 Potential to Achieve Goals: Good ADL Goals Pt Will Perform Grooming: with  supervision;sitting;standing Pt Will Perform Upper Body Dressing: with min assist;sitting;standing Pt Will Transfer to Toilet: with mod assist;stand pivot transfer;ambulating;regular height toilet;bedside commode Pt Will Perform Toileting - Clothing Manipulation and hygiene: with min assist;sitting/lateral leans;sit to/from stand Pt/caregiver will Perform Home Exercise Program: Increased ROM;Increased strength;Both right and left upper extremity;With Supervision;With written HEP provided  Plan Discharge plan remains appropriate                                    End of Session    OT Visit Diagnosis: Muscle weakness (generalized) (M62.81)   Activity Tolerance Patient tolerated treatment well   Patient Left in bed;with call bell/phone within reach;with bed alarm set             Time: 2761-4709 OT Time Calculation (min): 16 min  Charges: OT General Charges $OT Visit: 1 Visit OT Treatments $Self Care/Home Management : 8-22 mins  [facility] OT, MOT    Danie Chandler 09/28/2020, 4:38 PM

## 2020-09-28 NOTE — Care Management Important Message (Signed)
Important Message  Patient Details  Name: Catrinia Racicot MRN: [id number] Date of Birth: 1955/11/02   Medicare Important Message Given:  Yes     Corey Harold 09/28/2020, 1:40 PM

## 2020-09-28 NOTE — TOC Progression Note (Signed)
Transition of Care [facility]) - Progression Note    Patient Details  Name: Kendra Lee MRN: [id number] Date of Birth: 07-Apr-1955  Transition of Care [facility]) CM/SW Contact  Karn Cassis, [place] Phone Number: 09/28/2020, 3:40 PM  Clinical Narrative:  Per Eunice Blase at [place], Berkley Harvey is still pending. TOC will continue to follow.      Expected Discharge Plan: Skilled Nursing Facility Barriers to Discharge: Insurance Authorization  Expected Discharge Plan and Services Expected Discharge Plan: Skilled Nursing Facility       Living arrangements for the past 2 months: Single Family Home Expected Discharge Date: 09/24/20                                     Social Determinants of Health (SDOH) Interventions    Readmission Risk Interventions Readmission Risk Prevention Plan 09/20/2020  Medication Screening Complete  Transportation Screening Complete

## 2020-09-28 NOTE — Progress Notes (Signed)
09/28/2020 9:53 AM  Pt still awaiting for insurance authorization so that she can discharge to SNF.  Last night had some c/o of abdominal pain and TF stopped.  Requested dietitican to examine today may need to restart at lower TF goal.   She has had BM.  No complaint of abd pain this morning and abdomen was soft with normal BS.   Kendra Manuel MD How to contact the [id number] Attending or Consulting provider 7A - 7P or covering provider during after hours 7P -7A, for this patient?  Check the care team in [facility] and look for a) attending/consulting TRH provider listed and b) the [facility] team listed Log into [email or web address] and use [place]'s universal password to access. If you do not have the password, please contact the hospital operator. Locate the [facility] provider you are looking for under [facility] and page to a number that you can be directly reached. If you still have difficulty reaching the provider, please page the [facility] (Director on Call) for the Hospitalists listed on amion for assistance.

## 2020-09-28 NOTE — Progress Notes (Signed)
Nutrition Follow up  DOCUMENTATION CODES:   Underweight  INTERVENTION:  -Resume Osmolite 1.2 @ 40 ml/hr via PEG and increase q 4hr to goal rate of 55 ml/hr.   Tube feeding regimen provides 1584 kcal (100% of needs), 73 grams of protein, and 1082 ml of H2O.    Free water flushes 30 ml before and after meds and 200 ml -TID per tube.   Need current weight for assessment   NUTRITION DIAGNOSIS:   Inadequate oral intake related to inability to eat (recent stroke) as evidenced by NPO status. -addressed by enteral feeding  GOAL:  Patient will meet greater than or equal to 90% of their needs -MET  MONITOR:  Labs, Weight trends, Skin (plan of care regarding nutrition support)   ASSESSMENT: Patient is an underweight 65 yo female with acute encephalopathy, acute ischemic stroke.   6/29- Palliative Care addressed [place] with son.   ST evaluation today reviewed. Dysphagia-NPO for now. Risk for aspiration PNA. No family at bedside during RD visit. Talked with her nurse. If tube feeding becomes an option. Recommendations provided above. If expected to be short-term 6 weeks or less consider NGT placement.   7/1 PEG tube placed by IR  7/2 Enteral feeding initiated- Osmolite 1.2 (continuous) with goal rate 55 ml/hr.  7/5 Nursing at bedside during RD visit. She is tolerating tube feeds at goal, up in chair and more awake than during last visit. Disposition-short term rehab- planning in process. Palliative following. Continue current tube feeding regimen. Last BM????  7/7 talked with nursing this morning -pt tube feeding stopped due to abdominal pain. Last BM- small today and medium yesterday. No current complaints from patient. Will resume at lower rate.   Medications reviewed and include: B-1, Lipitor.  Labs: BMP Latest Ref Rng & Units 09/24/2020 09/21/2020 09/18/2020  Glucose 70 - 99 mg/dL 137(H) 110(H) 149(H)  BUN 8 - 23 mg/dL $Remove'11 8 15  'BftzpNs$ Creatinine 0.44 - 1.00 mg/dL 0.62 0.63 0.66  Sodium 135  - 145 mmol/L 136 137 140  Potassium 3.5 - 5.1 mmol/L 3.7 2.9(L) 3.6  Chloride 98 - 111 mmol/L 102 100 106  CO2 22 - 32 mmol/L $RemoveB'27 25 25  'AzZvzirg$ Calcium 8.9 - 10.3 mg/dL 8.8(L) 8.6(L) 9.2     Diet Order:   Diet Order             Diet NPO time specified Except for: Sips with Meds  Diet effective midnight                   EDUCATION NEEDS:   Not appropriate for education at this time  Skin:  Skin Assessment: Reviewed RN Assessment  Last BM:  6/30 type 6  Height:   Ht Readings from Last 1 Encounters:  09/18/20 $RemoveB'5\' 1"'AVjCxnLI$  (1.549 m)    Weight:   Wt Readings from Last 1 Encounters:  09/18/20 42.1 kg    Ideal Body Weight:   48 kg  BMI:  Body mass index is 17.54 kg/m.  Estimated Nutritional Needs:   Kcal:  5852-7782  Protein:  65-72 gr  Fluid:  >1200 ml daily   Colman Cater MS,RD,CSG,LDN Contact: Shea Evans

## 2020-09-28 NOTE — Progress Notes (Signed)
Palliative: Kendra Lee is lying quietly in bed.  She appears acutely/chronically ill and frail, much older than stated age.  She will briefly open her eyes when I speak with her.  I am not sure that she can make her basic needs known.  There is no family at bedside at this time.  I asked Kendra Lee if her stomach hurts and she seems to say yes.  I palpate her abdomen, there is slight grimace.  Call to son, Kendra Lee.  We talk in detail about Kendra Lee's condition.  We talked about pain in her abdomen, RD following.  We talked about her history of poor nutrition, in particular her poor dentition.  I encourage Kendra Lee to work with Child psychotherapist when she gets to [employer] for Transport planner appointment to remove teeth.  He shares his agreement that she is not going to be able to have a full recovery until she handle some of her health concerns.  Kendra Lee does tell me that he is encouraged in her improvements over the last few weeks.  We talked about 3 months for recovery for stroke.  We talked about disposition to [place], no date for DC at this point.  Conference with attending, bedside nursing staff, transition of care team related to patient condition, needs, goals of care, disposition.  It seems that we are awaiting prior Auth for transfer to short-term rehab.  Plan: Continue to treat the treatable, full scope/full code.  Short-term rehab with time for outcomes.  Outpatient palliative services to follow.  40 minutes  Kendra Carmel, NP Palliative medicine team Team phone [phone] Greater than 50% of this time was spent counseling and coordinating care related to the above assessment and plan.

## 2020-09-28 NOTE — Progress Notes (Signed)
[facility] Room [address] [facility]) Hospital Liaison note:  This is a pending outpatient-based Palliative Care patient. Will continue to follow for disposition.  Please call with any outpatient palliative questions or concerns.  Thank you, Abran Cantor, LPN [facility] Liaison [phone]

## 2020-09-28 NOTE — Progress Notes (Signed)
Pt verbalized that her stomach was hurting bad. Pt grabbing at stomach and facial grimacing. No residual noted at this time. Hypoactive bowel sounds. Pt had large bowel movement 09/27/20. See MAR for new order. Also gave order to Osmolite at this time.

## 2020-09-29 MED ORDER — COVID-19 MRNA VACC (MODERNA) 50 MCG/0.25ML IM SUSP
0.2500 mL | Freq: Once | INTRAMUSCULAR | Status: AC
  Administered 2020-09-30: 0.25 mL via INTRAMUSCULAR
  Filled 2020-09-29: qty 0.25

## 2020-09-29 MED ORDER — OSMOLITE 1.2 CAL PO LIQD
1000.0000 mL | ORAL | Status: DC
  Administered 2020-10-05 – 2020-10-06 (×3): 1000 mL

## 2020-09-29 MED ORDER — DEXTROSE-NACL 5-0.9 % IV SOLN
INTRAVENOUS | Status: DC
  Administered 2020-10-03: 1000 mL via INTRAVENOUS

## 2020-09-29 NOTE — Progress Notes (Signed)
SLP Cancellation Note  Patient Details Name: Kendra Lee MRN: [id number] DOB: [birth year]   Cancelled treatment:       Reason Eval/Treat Not Completed: Patient's level of consciousness;Fatigue/lethargy limiting ability to participate. Unable to rouse Pt to an appropriate level for PO trials; ST will continue efforts.   [name] [name], CCC-SLP Speech Language Pathologist    [name] 09/29/2020, 2:49 PM

## 2020-09-29 NOTE — Plan of Care (Signed)
  Problem: Education: Goal: Knowledge of General Education information will improve Description Including pain rating scale, medication(s)/side effects and non-pharmacologic comfort measures Outcome: Progressing   

## 2020-09-29 NOTE — Progress Notes (Signed)
09/29/2020 4:31 PM  Unfortunately patient somehow removed her PEG tube. It was found out by RN. I notified IR who had recently placed last week.  Unfortunately they cannot place a new tube until next week. Pt remains NPO per speech therapy so cannot give any of her oral meds or water by mouth. I have ordered for IV fluid with dextrose.  Check BMP in AM.    Maryln Manuel, MD [facility] How to contact the [facility] Attending or Consulting provider 7A - 7P or covering provider during after hours 7P -7A, for this patient?  Check the care team in [facility] and look for a) attending/consulting TRH provider listed and b) the [facility] team listed Log into [email or web address] and use [place]'s universal password to access. If you do not have the password, please contact the hospital operator. Locate the [facility] provider you are looking for under [facility] and page to a number that you can be directly reached. If you still have difficulty reaching the provider, please page the [facility] (Director on Call) for the Hospitalists listed on amion for assistance.

## 2020-09-29 NOTE — Progress Notes (Signed)
PT Cancellation Note  Patient Details Name: Kendra Lee MRN: [id number] DOB: 1955/09/11   Cancelled Treatment:    Reason Eval/Treat Not Completed: Fatigue/lethargy limiting ability to participate; Attempted PT treatment today but patient lethargic which is limiting ability to participate.  11:04 AM, 09/29/20 Wyman Songster PT, DPT Physical Therapist at [facility]

## 2020-09-29 NOTE — Progress Notes (Signed)
09/29/2020 11:18 AM  Pt still awaiting for insurance authorization so that she can discharge to SNF. Pt seems to be tolerating lower rate goal for tube feeds.  No abdominal distension and having bowel movements.   Maryln Manuel MD How to contact the [facility] Attending or Consulting provider 7A - 7P or covering provider during after hours 7P -7A, for this patient?  Check the care team in [facility] and look for a) attending/consulting [facility] provider listed and b) the [facility] team listed Log into [email or web address] and use [place]'s universal password to access. If you do not have the password, please contact the hospital operator. Locate the [facility] provider you are looking for under [facility] and page to a number that you can be directly reached. If you still have difficulty reaching the provider, please page the [facility] (Director on Call) for the Hospitalists listed on amion for assistance.

## 2020-09-29 NOTE — Progress Notes (Signed)
Interventional Radiology Brief Note:  Patient s/p percutaneous balloon-retention gastrostomy tube placement 09/22/20 by Dr. Lowella Dandy.  Unfortunately, tube found in bed by RN this AM.  Fresh tract, <70 week old.  RN unable to place anything in the tract. IR consulted for replacement. Discussed with Dr. Elby Showers.   Will work towards patient transport for possible replacement via existing tract vs. New placement early next week.  Care team aware.   Loyce Dys, MS RD PA-C 3:30 PM

## 2020-09-29 NOTE — Progress Notes (Signed)
Nutrition Follow up  DOCUMENTATION CODES:   Underweight  INTERVENTION:  -continue advancing  Osmolite 1.2 @ 40 ml/hr via PEG and increase q 4hr to goal rate of 55 ml/hr.   Tube feeding regimen provides 1584 kcal (100% of needs), 73 grams of protein, and 1082 ml of H2O.    Free water flushes 30 ml before and after meds and 200 ml -TID per tube.   Recommend consider adding bowel regimen.  NUTRITION DIAGNOSIS:   Inadequate oral intake related to inability to eat (recent stroke) as evidenced by NPO status. -addressed by enteral feeding  GOAL:  Patient will meet greater than or equal to 90% of their needs -MET  MONITOR:  Labs, Weight trends, Skin (plan of care regarding nutrition support)   ASSESSMENT: Patient is an underweight 64 yo female with acute encephalopathy, acute ischemic stroke.   6/29- Palliative Care addressed [place] with son.   ST evaluation today reviewed. Dysphagia-NPO for now. Risk for aspiration PNA. No family at bedside during RD visit. Talked with her nurse. If tube feeding becomes an option. Recommendations provided above. If expected to be short-term 6 weeks or less consider NGT placement.   7/1 PEG tube placed by IR  7/2 Enteral feeding initiated- Osmolite 1.2 (continuous) with goal rate 55 ml/hr.   7/5 Nursing at bedside during RD visit. She is tolerating tube feeds at goal, up in chair and more awake than during last visit. Disposition-short term rehab- planning in process. Palliative following. Continue current tube feeding regimen. Last BM????  7/7 talked with nursing this morning -pt tube feeding stopped due to abdominal pain. Last BM- small today and medium yesterday. No current complaints from patient. Will resume at lower rate.   7/8 Talked with nursing RN/NT. Patient tolerating tube feeds at current rate. Will advance to goal rate. Body weight updated today by NT- 44.7 kg.  Patient continues to be ready medically and waiting for SNF -insurance auth.    Medications:  B-1, Lipitor.  Labs reviewed: BMP Latest Ref Rng & Units 09/24/2020 09/21/2020 09/18/2020  Glucose 70 - 99 mg/dL 137(H) 110(H) 149(H)  BUN 8 - 23 mg/dL $Remove'11 8 15  'AfkvAnG$ Creatinine 0.44 - 1.00 mg/dL 0.62 0.63 0.66  Sodium 135 - 145 mmol/L 136 137 140  Potassium 3.5 - 5.1 mmol/L 3.7 2.9(L) 3.6  Chloride 98 - 111 mmol/L 102 100 106  CO2 22 - 32 mmol/L $RemoveB'27 25 25  'zDIwGAqe$ Calcium 8.9 - 10.3 mg/dL 8.8(L) 8.6(L) 9.2     Diet Order:   Diet Order             Diet NPO time specified Except for: Sips with Meds  Diet effective midnight                   EDUCATION NEEDS:   Not appropriate for education at this time  Skin:  Skin Assessment: Reviewed RN Assessment  Last BM:  7/7  Height:   Ht Readings from Last 1 Encounters:  09/18/20 $RemoveB'5\' 1"'ftSFyhAR$  (1.549 m)    Weight:   Wt Readings from Last 1 Encounters:  09/18/20 42.1 kg    Ideal Body Weight:   48 kg  BMI:  Body mass index is 17.54 kg/m.  Estimated Nutritional Needs:   Kcal:  4332-9518  Protein:  65-72 gr  Fluid:  >1200 ml daily   Colman Cater MS,RD,CSG,LDN Contact: Shea Evans

## 2020-09-30 DIAGNOSIS — I10 Essential (primary) hypertension: Secondary | ICD-10-CM | POA: Diagnosis not present

## 2020-09-30 DIAGNOSIS — I639 Cerebral infarction, unspecified: Secondary | ICD-10-CM | POA: Diagnosis not present

## 2020-09-30 DIAGNOSIS — G9341 Metabolic encephalopathy: Secondary | ICD-10-CM | POA: Diagnosis not present

## 2020-09-30 MED ORDER — HYDRALAZINE HCL 20 MG/ML IJ SOLN: 5.0000 mg | INTRAMUSCULAR | Status: DC

## 2020-09-30 MED ORDER — HYDRALAZINE HCL 20 MG/ML IJ SOLN
5.0000 mg | INTRAMUSCULAR | Status: DC
  Administered 2020-09-30 – 2020-10-01 (×6): 5 mg via INTRAVENOUS
  Filled 2020-09-30 (×7): qty 1

## 2020-09-30 MED ORDER — METOPROLOL TARTRATE 5 MG/5ML IV SOLN
5.0000 mg | Freq: Four times a day (QID) | INTRAVENOUS | Status: DC
  Administered 2020-09-30 – 2020-10-02 (×9): 5 mg via INTRAVENOUS
  Filled 2020-09-30 (×9): qty 5

## 2020-09-30 NOTE — Progress Notes (Signed)
Oral care provided. Patient's lower gums are swollen and her teeth are black at roots. She will not open mouth wide enough to clean tongue.

## 2020-09-30 NOTE — Progress Notes (Signed)
Recheck blood pressure and found oxygen sat to be 91% on room air. Placed patient on 2LPM via [place].

## 2020-09-30 NOTE — Progress Notes (Signed)
Patient blood pressure 190/95. PRN hydralazine 5mg  given iv.

## 2020-09-30 NOTE — Plan of Care (Signed)
  Problem: Education: Goal: Knowledge of General Education information will improve Description Including pain rating scale, medication(s)/side effects and non-pharmacologic comfort measures Outcome: Progressing   

## 2020-09-30 NOTE — Progress Notes (Signed)
PROGRESS NOTE  Kendra PicklesJo Ann Lee NWG:[id number] DOB: 14-Oct-1955 DOA: 09/17/2020 PCP: Pcp, No  Brief History:  65 y.o. female, with no known medical history, who presents to the ED with altered mental status. History is quite limited 2/2 patient's acute encephalopathy. Earlier, family was able to provide some history to the ED provider. They reported that the patient's last known normal was 6/24. She has been somnolent, and not taking care of herself in that interval. Family was not aware of an EtOH or illicit drug use. They are not aware of an trauma, and there are not signs of trauma on exam. Family reported no infectious symptoms or fevers.  Work up revealed patient suffered bilateral thalamic infarcts that have contributed to her encephalopathy.  After a goals of care discussion, patient's son wanted full scope of care.  Patient failed swallow eval and PEG tube was placed 09/22/20 and enteral feeding was started and titrated to goal rate without difficulty.  ED: Vitals stable, encephalopathic, CT head shows focal regions of hypoattenuation seen in the genu of the right internal capsule, posterior limb of the left internal capsule and right thalamus which could reflect age-indeterminate infarction.  No other acute abnormality    Assessment/Plan: Devastating Acute ischemic stroke/Acute metabolic Encephalopathy -Dr. Arbutus Leasat discussed with neurology, Dr. Eligah [place]-->encephalopathy most likely due to stroke -encephalopathy seems to be resolved at this time.  -MR brain--scattered bilateral infarcts of thalamus -carotid US--R-ICA--50-69%; L-ICA <50% -MRA brain--no LVO -Echo (TTE)--EF 60-65%, no WMA, no PFO -A1C--5.5 -LDL--138 -continue ASA and plavix -discussed with cardiology--not a good candidate for TEE nor loop recorder -TSH--1.927 -ammonia 15>>26 -B12--262 -folate 4.8 -UA--no pyuria   Hyperlipidemia -continue statin   Dysphagia -speech eval-->not safe for po intake -IR placed  PEG 09/22/20 -started enteral feeding 7/2 -tolerated Osmolite 1.2 @ 40 cc/hr -Free Water 200 cc every 8 hours -PT REMOVED PEG ON 7/8 -IR planning to replace early next week -IV dextrose infusion ordered for free water and glucose   Hypertension -allowed permissive hypertension initially -since PEG was removed by patient we cannot give oral BP meds -I have ordered IV BP meds until patient can have a new PEG placed.   Hypokalemia -repleted    Tobacco abuse -she was smoking up to 2 ppd prior to admssion -cessation discussed    Status is: Inpatient  Remains inpatient appropriate because:Persistent severe electrolyte disturbances  Dispo: The patient is from: Home              Anticipated d/c is to: SNF              Patient currently is medically stable to d/c.   Difficult to place patient No   Family Communication:  no family present for last several days  Consultants:  neurology  Code Status:  FULL  DVT Prophylaxis:  [place] Heparin   Procedures: As Listed in Progress Note Above  Antibiotics: None  Subjective: Patient not answering questions or cooperative with questions today  Objective: Vitals:   09/30/20 0804 09/30/20 0846 09/30/20 0956 09/30/20 1348  BP: (!) 193/102 (!) 166/102 (!) 180/94 (!) 142/100  Pulse: 96   74  Resp: 16   18  Temp: 98.1 F (36.7 C)   98.3 F (36.8 C)  TempSrc: Oral   Oral  SpO2: 95%   95%  Weight:      Height:        Intake/Output Summary (Last 24 hours) at 09/30/2020 1539 Last  data filed at 09/30/2020 0600 Gross per 24 hour  Intake 705.27 ml  Output 600 ml  Net 105.27 ml   Weight change:  Exam:  General:  emaciated chronically ill appearing, somnolent but arousable HEENT: No icterus, No thrush, No neck mass, [place]/AT Cardiovascular: distant S1/S2, no rubs, no gallops Respiratory: bibasilar rales. No wheeze Abdomen: soft, no masses palpated, G tube site covered in bandage. Extremities: No edema, No lymphangitis, No petechiae, No  rashes, no synovitis   Data Reviewed: I have personally reviewed following labs and imaging studies Basic Metabolic Panel: Recent Labs  Lab 09/24/20 0426  NA 136  K 3.7  CL 102  CO2 27  GLUCOSE 137*  BUN 11  CREATININE 0.62  CALCIUM 8.8*   Liver Function Tests: No results for input(s): AST, ALT, ALKPHOS, BILITOT, PROT, ALBUMIN in the last 168 hours. No results for input(s): LIPASE, AMYLASE in the last 168 hours. No results for input(s): AMMONIA in the last 168 hours.  Coagulation Profile: No results for input(s): INR, PROTIME in the last 168 hours.  CBC: Recent Labs  Lab 09/24/20 0426  WBC 7.2  HGB 11.7*  HCT 35.0*  MCV 94.1  PLT 274   Cardiac Enzymes: No results for input(s): CKTOTAL, CKMB, CKMBINDEX, TROPONINI in the last 168 hours. BNP: Invalid input(s): POCBNP CBG: No results for input(s): GLUCAP in the last 168 hours. HbA1C: No results for input(s): HGBA1C in the last 72 hours. Urine analysis:    Component Value Date/Time   COLORURINE YELLOW 09/18/2020 0020   APPEARANCEUR CLEAR 09/18/2020 0020   LABSPEC 1.013 09/18/2020 0020   PHURINE 5.0 09/18/2020 0020   GLUCOSEU NEGATIVE 09/18/2020 0020   HGBUR SMALL (A) 09/18/2020 0020   BILIRUBINUR NEGATIVE 09/18/2020 0020   KETONESUR 20 (A) 09/18/2020 0020   PROTEINUR NEGATIVE 09/18/2020 0020   NITRITE NEGATIVE 09/18/2020 0020   LEUKOCYTESUR NEGATIVE 09/18/2020 0020   No results found for this or any previous visit (from the past 240 hour(s)).   Scheduled Meds:  amLODipine  5 mg Per Tube Daily   aspirin  81 mg Per Tube Daily   atorvastatin  80 mg Per Tube Daily   clopidogrel  75 mg Per Tube Daily   COVID-19 mRNA vaccine (Moderna)  0.25 mL Intramuscular ONCE-1600   free water  200 mL Per Tube Q8H   heparin  5,000 Units Subcutaneous Q8H   hydrALAZINE  5 mg Intravenous Q4H   hydrALAZINE  10 mg Oral Q8H   losartan  25 mg Per Tube Daily   mouth rinse  15 mL Mouth Rinse BID   metoprolol tartrate  5 mg  Intravenous Q6H   neomycin-bacitracin-polymyxin   Topical Q1400   pneumococcal 23 valent vaccine  0.5 mL Intramuscular Tomorrow-1000   scopolamine  1 patch Transdermal Q72H   thiamine  100 mg Per Tube Daily   Continuous Infusions:  dextrose 5 % and 0.9% NaCl 55 mL/hr at 09/30/20 0600   feeding supplement (OSMOLITE 1.2 CAL)     folic acid (FOLVITE) IVPB 1 mg (09/30/20 1049)    Procedures/Studies: CT ABDOMEN WO CONTRAST  Result Date: 09/21/2020 CLINICAL DATA:  Evaluate anatomy prior to potential percutaneous gastrostomy tube placement. EXAM: CT ABDOMEN WITHOUT CONTRAST TECHNIQUE: Multidetector CT imaging of the abdomen was performed following the standard protocol without IV contrast. COMPARISON:  None. FINDINGS: The lack of intravenous contrast limits the ability to evaluate solid abdominal organs. Lower chest: Limited visualization of the lower thorax demonstrates minimal dependent subpleural atelectasis. Normal heart  size. No pericardial effusion. Hepatobiliary: Normal hepatic contour. Normal noncontrast appearance of the gallbladder given degree distention. No radiopaque gallstones. There is a trace amount of perihepatic ascites. Pancreas: Normal noncontrast appearance of the pancreas. Spleen: Normal noncontrast appearance of the spleen. 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 Normal noncontrast appearance of the right kidney. No renal stones. No right-sided urinary obstruction. Normal noncontrast appearance the bilateral adrenal glands. The urinary bladder was not imaged. Stomach/Bowel: The anterior wall the stomach is well apposed against the ventral wall of the abdomen without interposition of the hepatic parenchyma or the transverse colon. Moderate colonic stool burden without evidence of enteric obstruction. No pneumoperitoneum, pneumatosis or portal venous gas. Vascular/Lymphatic: Large amount of  irregular calcified atherosclerotic plaque within a bilobed ectatic abdominal aorta with dominant caudal component measuring 2.6 cm and maximal oblique short axis coronal diameter (coronal image 33, series 5). No bulky retroperitoneal or mesenteric lymphadenopathy. Other: Regional soft tissues appear normal. Musculoskeletal: No acute or aggressive osseous abnormalities. Mild DDD of L4-L5 with disc space height loss, endplate irregularity and sclerosis. IMPRESSION: 1. Gastric anatomy amenable to potential percutaneous gastrostomy tube placement as indicated. 2. 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 3. Mildly ectatic abdominal aorta measuring 2.6 cm in maximal diameter. Recommend follow-up aortic ultrasound in 5 years. This recommendation follows ACR consensus guidelines: White Paper of the ACR Incidental Findings Committee II on Vascular Findings. J Am Coll Radiol 2013; 10:789-794. 4. Aortic aneurysm NOS (ICD10-I71.9). Electronically Signed   By: Simonne Come M.D.   On: 09/21/2020 09:36   CT Head Wo Contrast  Result Date: 09/17/2020 CLINICAL DATA:  Mental status change, found this afternoon on couch EXAM: CT HEAD WITHOUT CONTRAST TECHNIQUE: Contiguous axial images were obtained from the base of the skull through the vertex without intravenous contrast. COMPARISON:  None. FINDINGS: Brain: Foci of hypoattenuation present in the right internal capsule as as a separate focus in the right caudate and posterior limb of the left internal capsule could reflect sequela of age-indeterminate lacunar type infarcts difficult to fully assess given a background of more diffuse patchy white matter hypoattenuation typically indicative of microvascular angiopathy in a patient of this age. Additional hypoattenuation in the pons and brainstem is more nonspecific given streak artifact across skull base. Symmetric prominence of the ventricles, cisterns and sulci  compatible with parenchymal volume loss. No hyperdense hemorrhage. No mass effect or midline shift. No extra-axial collection. Scattered benign dural calcifications. Vascular: Atherosclerotic calcification of the carotid siphons. No hyperdense vessel. Skull: No calvarial fracture or suspicious osseous lesion. No scalp swelling or hematoma. Sinuses/Orbits: Paranasal sinuses and mastoid air cells are predominantly clear. Other: Included orbital structures are unremarkable. 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 Could be further characterized with MR imaging as warranted. No other acute intracranial abnormality. These results were called by telephone at the time of interpretation on 09/17/2020 at 11:45 pm to provider Benjiman Core , who verbally acknowledged these results. Electronically Signed   By: Kreg Shropshire M.D.   On: 09/17/2020 23:45   MR ANGIO HEAD WO CONTRAST  Result Date: 09/18/2020 CLINICAL DATA:  Altered mental status. EXAM: MRI HEAD WITHOUT CONTRAST MRA HEAD WITHOUT CONTRAST TECHNIQUE: Multiplanar,  multi-echo pulse sequences of the brain and surrounding structures were acquired without intravenous contrast. Angiographic images of the Circle of Willis were acquired using MRA technique without intravenous contrast. COMPARISON:  No pertinent prior exam. FINDINGS: MRI HEAD FINDINGS Brain: Restricted diffusion in the bilateral thalamus, bilateral internal capsule, left centrum semiovale, and right subcortical frontal. These have the appearance of acute infarcts. Mild small vessel ischemic type change in the pons and hemispheric white matter. No evidence of hemorrhage, hydrocephalus, or collection. Vascular: Preserved flow voids.  Skull and upper cervical spine: Normal marrow signal Sinuses/Orbits: Negative Other: Significant and progressive motion artifact MRA HEAD FINDINGS Very motion degraded. Carotid, vertebral, and basilar arteries and major branches are patent with gross symmetry. IMPRESSION: Brain MRI: 1. 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 These are not in a unified arterial or venous distribution and are of uncertain cause. No revealing background chronic brain findings. 2. Significantly motion degraded Intracranial MRA: Very motion degraded and of limited utility other than documenting patency of major vessels. Electronically Signed   By: Marnee Spring M.D.   On: 09/18/2020 08:43   MR BRAIN WO CONTRAST  Result Date: 09/18/2020 CLINICAL DATA:  Altered mental status. EXAM: MRI HEAD WITHOUT CONTRAST MRA HEAD WITHOUT CONTRAST TECHNIQUE: Multiplanar, multi-echo pulse sequences of the brain and surrounding structures were acquired without intravenous contrast. Angiographic images of the Circle of Willis were acquired using MRA technique without intravenous contrast. COMPARISON:  No pertinent prior exam. FINDINGS: MRI HEAD FINDINGS Brain: Restricted diffusion in the bilateral thalamus, bilateral internal capsule, left centrum semiovale, and right subcortical frontal. These have the appearance of acute infarcts. Mild small vessel ischemic type change in the pons and hemispheric white matter. No evidence of hemorrhage, hydrocephalus, or collection. Vascular: Preserved flow voids. Skull and upper cervical spine: Normal marrow signal Sinuses/Orbits: Negative Other: Significant and progressive motion artifact MRA HEAD FINDINGS Very motion degraded. Carotid, vertebral, and basilar arteries and major branches are patent with gross symmetry. IMPRESSION: Brain MRI: 1. Scattered small vessel infarcts in the bilateral thalamus, bilateral internal capsule,  and bilateral hemispheric white matter. These are not in a unified arterial or venous distribution and are of uncertain cause. No revealing background chronic brain findings. 2. Significantly motion degraded Intracranial MRA: Very motion degraded and of limited utility other than documenting patency of major vessels. Electronically Signed   By: Marnee Spring M.D.   On: 09/18/2020 08:43   IR GASTROSTOMY TUBE MOD SED  Result Date: 09/22/2020 INDICATION: 65 year old with altered mental status and dysphagia. Request for gastrostomy tube placement. EXAM: PERCUTANEOUS GASTROSTOMY TUBE WITH FLUOROSCOPIC GUIDANCE Physician: Rachelle Hora. Lowella Dandy, MD MEDICATIONS: Ancef 2 g; Antibiotics were administered within 1 hour of the procedure. Glucagon 0.5 mg ANESTHESIA/SEDATION: Versed 2.0 mg IV; Fentanyl 100 mcg IV Moderate Sedation Time:  42 minutes The patient was continuously monitored during the procedure by the interventional radiology nurse under my direct supervision. FLUOROSCOPY TIME:  Fluoroscopy Time: 12 minutes, 6 seconds, 19 mGy CONTRAST:  20 mL Omnipaque 300 COMPLICATIONS: None immediate. PROCEDURE: Informed consent was obtained for a percutaneous gastrostomy tube. The patient was placed on the interventional table. An orogastric tube was placed with fluoroscopic guidance. The anterior abdomen was prepped and draped in sterile fashion. Maximal barrier sterile technique was utilized including caps, mask, sterile gowns, sterile gloves, sterile drape, hand hygiene and skin antiseptic. Stomach was inflated with air through the orogastric tube. The skin and subcutaneous tissues were anesthetized with  1% lidocaine. A 17 gauge needle was directed into the distended stomach with fluoroscopic guidance. A wire was advanced into the stomach and a T fastener was deployed. A 9-French vascular sheath was placed and the orogastric tube was snared using a [place] snare device. At this time, the patient was clenching her teeth and we  could not open her mouth in order to place the pull-through gastrostomy tube. As a result, we converted to a push through gastrostomy tube. Skin around the 9 French drain was anesthetized with 1% lidocaine. Using fluoroscopic guidance, 2 additional T-fasteners were deployed. The T fastener associated with the 9 French sheath was cut. The 9 French sheath was removed and dilated up to an 18 [place] peel-away sheath. A 16 French Entuit gastrostomy tube was advanced over the wire and through the peel-away sheath. The balloon was inflated with approximately 8 mL of saline. Peel-away sheath and wire were removed. Contrast injection confirmed placement in the stomach. Fluoroscopic images were obtained for documentation. The gastrostomy tube was flushed with normal saline. FINDINGS: 4 French balloon retention gastrostomy tube is positioned in the stomach. Total of 3 T-fasteners were deployed and there are still 2 T-fasteners intact. Contrast injection confirmed placement in the stomach. IMPRESSION: 1. Successful placement of a percutaneous gastrostomy tube using fluoroscopy. This is a balloon retention gastrostomy tube. 2. There are 2 remaining T-fasteners in place. Plan to cut the T-fasteners in 7-10 days. Electronically Signed   By: Richarda Overlie M.D.   On: 09/22/2020 12:07   US Carotid Bilateral (at [facility] and [facility] only)  Result Date: 09/18/2020 CLINICAL DATA:  Altered mental status Hypertension Stroke EXAM: BILATERAL CAROTID DUPLEX ULTRASOUND TECHNIQUE: Wallace Cullens scale imaging, color Doppler and duplex ultrasound were performed of bilateral carotid and vertebral arteries in the neck. COMPARISON:  None. FINDINGS: Criteria: Quantification of carotid stenosis is based on velocity parameters that correlate the residual internal carotid diameter with NASCET-based stenosis levels, using the diameter of the distal internal carotid lumen as the denominator for stenosis measurement. The following velocity measurements were obtained:  RIGHT ICA: 156/29 cm/sec CCA: 77/11 cm/sec SYSTOLIC ICA/CCA RATIO:  2.0 ECA: 173 cm/sec LEFT ICA: 83/18 cm/sec CCA: 85/14 cm/sec SYSTOLIC ICA/CCA RATIO:  1.0 ECA: 92 cm/sec RIGHT CAROTID ARTERY: Heterogeneous calcified and noncalcified plaque of the distal common and proximal internal carotid arteries with velocity indicative of 50-69% stenosis. RIGHT VERTEBRAL ARTERY:  Antegrade flow. LEFT CAROTID ARTERY: Heterogeneous calcified and noncalcified plaque of the distal common and proximal internal carotid arteries with velocity parameters indicative of less than 50% stenosis. LEFT VERTEBRAL ARTERY:  Antegrade flow. IMPRESSION: 1. 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 2. Less than 50% stenosis of the left internal carotid artery. 3. Long segment shadowing plaque present in both common and internal carotid arteries, which can obscure higher velocities. Further evaluation with MRI or CT of the neck should be performed for more precise evaluation of degree of stenosis. Electronically Signed   By: Acquanetta Belling M.D.   On: 09/18/2020 10:01   DG Chest Portable 1 View  Result Date: 09/17/2020 CLINICAL DATA:  Altered mental status EXAM: PORTABLE CHEST 1 VIEW COMPARISON:  None FINDINGS: There is hyperinflation of the lungs compatible with COPD. Heart and mediastinal contours are within normal limits. No focal opacities or effusions. No acute bony abnormality. IMPRESSION: COPD.  No active disease. Electronically Signed   By: Charlett Nose M.D.   On: 09/17/2020 23:02   ECHOCARDIOGRAM COMPLETE  Result Date: 09/18/2020  ECHOCARDIOGRAM REPORT   Patient Name:   [facility] Date of Exam: 09/18/2020 Medical Rec #:  [id number]       Height: Accession #:    [id number]      Weight: Date of Birth:  [birth year]       BSA: Patient Age:    64 years        BP:           213/100 mmHg Patient Gender: F               HR:           59 bpm. [facility] Procedure: 2D Echo, Cardiac Doppler and Color Doppler  Indications:    Stroke  History:        Patient has no prior history of Echocardiogram examinations.                 Stroke; Risk Factors:Hypertension. Height and Weight not                 available.  Sonographer:    Mikki Harbor Referring Phys: [id number] ASIA B ZIERLE-GHOSH  Sonographer Comments: Height and Weight not available IMPRESSIONS  1. Left ventricular ejection fraction, by estimation, is 60 to 65%. The left ventricle has normal function. The left ventricle has no regional wall motion abnormalities. There is mild left ventricular hypertrophy. Left ventricular diastolic parameters were normal.  2. Right ventricular systolic function is normal. The right ventricular size is normal. Tricuspid regurgitation signal is inadequate for assessing PA pressure.  3. The mitral valve is grossly normal. Trivial mitral valve regurgitation.  4. The aortic valve is tricuspid. Aortic valve regurgitation is not visualized. Mild aortic valve sclerosis is present, with no evidence of aortic valve stenosis. Aortic valve mean gradient measures 3.0 mmHg.  5. The inferior vena cava is normal in size with greater than 50% respiratory variability, suggesting right atrial pressure of 3 mmHg. FINDINGS  Left Ventricle: Left ventricular ejection fraction, by estimation, is 60 to 65%. The left ventricle has normal function. The left ventricle has no regional wall motion abnormalities. The left ventricular internal cavity size was normal in size. There is  mild left ventricular hypertrophy. Left ventricular diastolic parameters were normal. Right Ventricle: The right ventricular size is normal. No increase in right ventricular wall thickness. Right ventricular systolic function is normal. Tricuspid regurgitation signal is inadequate for assessing PA pressure. Left Atrium: Left atrial size was normal in size. Right Atrium: Right atrial size was normal in size. Pericardium: There is no evidence of pericardial effusion. Presence of  pericardial fat pad. Mitral Valve: The mitral valve is grossly normal. Mild mitral annular calcification. Trivial mitral valve regurgitation. MV peak gradient, 2.7 mmHg. The mean mitral valve gradient is 1.0 mmHg. Tricuspid Valve: The tricuspid valve is grossly normal. Tricuspid valve regurgitation is trivial. Aortic Valve: The aortic valve is tricuspid. There is mild aortic valve annular calcification. Aortic valve regurgitation is not visualized. Mild aortic valve sclerosis is present, with no evidence of aortic valve stenosis. Aortic valve mean gradient measures 3.0 mmHg. Aortic valve peak gradient measures 5.7 mmHg. Aortic valve area, by VTI measures 2.71 cm. Pulmonic Valve: The pulmonic valve was grossly normal. Pulmonic valve regurgitation is trivial. Aorta: The aortic root is normal in size and structure. Venous: The inferior vena cava is normal in size with greater than 50% respiratory variability, suggesting right atrial pressure of 3 mmHg. IAS/Shunts: The interatrial septum appears to be lipomatous.  No atrial level shunt detected by color flow Doppler.  LEFT VENTRICLE PLAX 2D LVIDd:         4.38 cm  Diastology LVIDs:         2.91 cm  LV e' medial:    7.10 cm/s LV PW:         1.04 cm  LV E/e' medial:  11.9 LV IVS:        1.06 cm  LV e' lateral:   7.62 cm/s LVOT diam:     2.00 cm  LV E/e' lateral: 11.0 LV SV:         90 LVOT Area:     3.14 cm  RIGHT VENTRICLE RV Basal diam:  3.37 cm RV Mid diam:    3.06 cm RV S prime:     10.20 cm/s TAPSE (M-mode): 2.3 cm LEFT ATRIUM             RIGHT ATRIUM LA diam:        3.00 cm RA Area:     13.30 cm LA Vol (A2C):   49.5 ml RA Volume:   31.30 ml LA Vol (A4C):   28.7 ml LA Biplane Vol: 40.5 ml  AORTIC VALVE AV Area (Vmax):    2.85 cm AV Area (Vmean):   2.55 cm AV Area (VTI):     2.71 cm AV Vmax:           119.00 cm/s AV Vmean:          86.100 cm/s AV VTI:            0.330 m AV Peak Grad:      5.7 mmHg AV Mean Grad:      3.0 mmHg LVOT Vmax:         108.00 cm/s LVOT  Vmean:        69.800 cm/s LVOT VTI:          0.285 m LVOT/AV VTI ratio: 0.86  AORTA Ao Root diam: 3.30 cm MITRAL VALVE MV Area (PHT): 3.15 cm    SHUNTS MV Area VTI:   2.54 cm    Systemic VTI:  0.29 m MV Peak grad:  2.7 mmHg    Systemic Diam: 2.00 cm MV Mean grad:  1.0 mmHg MV Vmax:       0.81 m/s MV Vmean:      41.2 cm/s MV Decel Time: 241 msec MV E velocity: 84.20 cm/s MV A velocity: 72.70 cm/s MV E/A ratio:  1.16 Nona Dell MD Electronically signed by Nona Dell MD Signature Date/Time: 09/18/2020/2:03:27 PM    Final     Standley Dakins, MD How to contact the [facility] Attending or Consulting provider 7A - 7P or covering provider during after hours 7P -7A, for this patient?  Check the care team in [facility] and look for a) attending/consulting TR[facility] provider listed and b) the [facility] team listed Log into [email or web address] and use [facility] universal password to access. If you do not have the password, please contact the hospital operator. Locate the [facility] provider you are looking for under [facility] and page to a number that you can be directly reached. If you still have difficulty reaching the provider, please page the [facility] (Director on Call) for the Hospitalists listed on amion for assistance.  09/30/2020, 3:39 PM   LOS: 12 days

## 2020-10-01 ENCOUNTER — Inpatient Hospital Stay (HOSPITAL_COMMUNITY): Payer: Medicare Other

## 2020-10-01 DIAGNOSIS — I10 Essential (primary) hypertension: Secondary | ICD-10-CM | POA: Diagnosis not present

## 2020-10-01 DIAGNOSIS — I639 Cerebral infarction, unspecified: Secondary | ICD-10-CM | POA: Diagnosis not present

## 2020-10-01 DIAGNOSIS — G9341 Metabolic encephalopathy: Secondary | ICD-10-CM | POA: Diagnosis not present

## 2020-10-01 LAB — CBC WITH DIFFERENTIAL/PLATELET
Abs Immature Granulocytes: 0.08 10*3/uL — ABNORMAL HIGH (ref 0.00–0.07)
Basophils Absolute: 0.1 10*3/uL (ref 0.0–0.1)
Basophils Relative: 0 %
Eosinophils Absolute: 0 10*3/uL (ref 0.0–0.5)
Eosinophils Relative: 0 %
HCT: 34.5 % — ABNORMAL LOW (ref 36.0–46.0)
Hemoglobin: 11.4 g/dL — ABNORMAL LOW (ref 12.0–15.0)
Immature Granulocytes: 1 %
Lymphocytes Relative: 6 %
Lymphs Abs: 0.8 10*3/uL (ref 0.7–4.0)
MCH: 31.5 pg (ref 26.0–34.0)
MCHC: 33 g/dL (ref 30.0–36.0)
MCV: 95.3 fL (ref 80.0–100.0)
Monocytes Absolute: 0.6 10*3/uL (ref 0.1–1.0)
Monocytes Relative: 4 %
Neutro Abs: 11.6 10*3/uL — ABNORMAL HIGH (ref 1.7–7.7)
Neutrophils Relative %: 89 %
Platelets: 360 10*3/uL (ref 150–400)
RBC: 3.62 MIL/uL — ABNORMAL LOW (ref 3.87–5.11)
RDW: 13.5 % (ref 11.5–15.5)
WBC: 13.1 10*3/uL — ABNORMAL HIGH (ref 4.0–10.5)
nRBC: 0 % (ref 0.0–0.2)

## 2020-10-01 LAB — BASIC METABOLIC PANEL
Anion gap: 4 — ABNORMAL LOW (ref 5–15)
BUN: 36 mg/dL — ABNORMAL HIGH (ref 8–23)
CO2: 29 mmol/L (ref 22–32)
Calcium: 8.6 mg/dL — ABNORMAL LOW (ref 8.9–10.3)
Chloride: 107 mmol/L (ref 98–111)
Creatinine, Ser: 0.61 mg/dL (ref 0.44–1.00)
GFR, Estimated: >60 mL/min (ref >60)
Glucose, Bld: 112 mg/dL — ABNORMAL HIGH (ref 70–99)
Potassium: 2.9 mmol/L — ABNORMAL LOW (ref 3.5–5.1)
Sodium: 140 mmol/L (ref 135–145)

## 2020-10-01 LAB — URINALYSIS, ROUTINE W REFLEX MICROSCOPIC
Bilirubin Urine: NEGATIVE
Glucose, UA: NEGATIVE mg/dL
Hgb urine dipstick: NEGATIVE
Ketones, ur: NEGATIVE mg/dL
Leukocytes,Ua: NEGATIVE
Nitrite: NEGATIVE
Protein, ur: NEGATIVE mg/dL
Specific Gravity, Urine: 1.016 (ref 1.005–1.030)
pH: 5 (ref 5.0–8.0)

## 2020-10-01 LAB — MAGNESIUM: Magnesium: 2.4 mg/dL (ref 1.7–2.4)

## 2020-10-01 MED ORDER — CLONIDINE HCL 0.1 MG/24HR TD PTWK
0.1000 mg | MEDICATED_PATCH | TRANSDERMAL | Status: DC
  Administered 2020-10-01: 0.1 mg via TRANSDERMAL
  Filled 2020-10-01: qty 1

## 2020-10-01 MED ORDER — HYDRALAZINE HCL 20 MG/ML IJ SOLN
10.0000 mg | INTRAMUSCULAR | Status: DC
  Administered 2020-10-01 – 2020-10-02 (×6): 10 mg via INTRAVENOUS
  Filled 2020-10-01 (×6): qty 1

## 2020-10-01 MED ORDER — POTASSIUM CHLORIDE 10 MEQ/100ML IV SOLN
10.0000 meq | INTRAVENOUS | Status: AC
  Administered 2020-10-01 (×4): 10 meq via INTRAVENOUS
  Filled 2020-10-01 (×4): qty 100

## 2020-10-01 MED ORDER — POTASSIUM CHLORIDE 10 MEQ/100ML IV SOLN: 10.0000 meq | INTRAVENOUS | Status: DC

## 2020-10-01 NOTE — Progress Notes (Addendum)
PROGRESS NOTE  Kendra Lee SWN:[id number] DOB: 1955/04/06 DOA: 09/17/2020 PCP: Pcp, No  Brief History:  65 y.o. female, with no known medical history, who presents to the ED with altered mental status. History is quite limited 2/2 patient's acute encephalopathy. Earlier, family was able to provide some history to the ED provider. They reported that the patient's last known normal was 6/24. She has been somnolent, and not taking care of herself in that interval. Family was not aware of an EtOH or illicit drug use. They are not aware of an trauma, and there are not signs of trauma on exam. Family reported no infectious symptoms or fevers.  Work up revealed patient suffered bilateral thalamic infarcts that have contributed to her encephalopathy.  After a goals of care discussion, patient's son wanted full scope of care.  Patient failed swallow eval and PEG tube was placed 09/22/20 and enteral feeding was started and titrated to goal rate without difficulty.  ED: Vitals stable, encephalopathic, CT head shows focal regions of hypoattenuation seen in the genu of the right internal capsule, posterior limb of the left internal capsule and right thalamus which could reflect age-indeterminate infarction.  No other acute abnormality    Assessment/Plan: Devastating Acute ischemic stroke/Acute metabolic Encephalopathy -Dr. Arbutus Leas discussed with neurology, Dr. Eligah East most likely due to stroke -encephalopathy seems to be resolved at this time.  -MR brain--scattered bilateral infarcts of thalamus -carotid US--R-ICA--50-69%; L-ICA <50% -MRA brain--no LVO -Echo (TTE)--EF 60-65%, no WMA, no PFO -A1C--5.5 -LDL--138 -continue ASA and plavix -discussed with cardiology--not a good candidate for TEE nor loop recorder -TSH--1.927 -ammonia 15>>26 -B12--262 -folate 4.8 -UA--recheck ordered 7/10   Hyperlipidemia -continue statin   Severe Dysphagia with high aspiration risk -speech  eval-->not safe for po intake -IR placed PEG 09/22/20 -started enteral feeding 7/2 -tolerated Osmolite 1.2 @ 40 cc/hr -Free Water 200 cc every 8 hours -PT REMOVED PEG ON 7/8 -IR planning to replace early next week -IV dextrose infusion ordered for free water and glucose  Leukocytosis - check CXR, urinalysis    Hypertension -allowed permissive hypertension initially -since PEG was removed by patient we cannot give oral BP meds -I have ordered IV BP meds until patient can have a new PEG placed. -added clonidine patch 7/10   Hypokalemia -IV replacement ordered 7/10, recheck in AM.    Tobacco abuse -she was smoking up to 2 ppd prior to admssion -cessation discussed   Generalized weakness and debilitation  - B12 is WNL - check 25-OH vitamin D   Status is: Inpatient  Remains inpatient appropriate because:Persistent severe electrolyte disturbances  Dispo: The patient is from: Home              Anticipated d/c is to: SNF              Patient currently is medically stable to d/c.   Difficult to place patient No   Family Communication:  no family present for last several days during rounds  Consultants:  neurology  Code Status:  FULL  DVT Prophylaxis:   Heparin   Procedures: As Listed in Progress Note Above  Antibiotics: None  Subjective: Patient had been asking RN for water but she remains NPO due to severe dysphagia and high aspiration risk  Objective: Vitals:   10/01/20 0000 10/01/20 0200 10/01/20 0455 10/01/20 1206  BP: (!) 189/72 (!) 149/76 (!) 166/69 (!) 178/76  Pulse: 78 78 70 80  Resp: 16  16 16  Temp:   98 F (36.7 C) 98.6 F (37 C)  TempSrc:   Oral Oral  SpO2:   99% 94%  Weight:      Height:        Intake/Output Summary (Last 24 hours) at 10/01/2020 1222 Last data filed at 10/01/2020 1100 Gross per 24 hour  Intake 930.73 ml  Output 900 ml  Net 30.73 ml   Weight change:  Exam:  General:  very thin, emaciated chronically ill appearing,  somnolent but arousable HEENT: No icterus, No thrush, No neck mass, [place]/AT Cardiovascular: distant S1/S2, no rubs, no gallops Respiratory: bibasilar rales. No wheeze Abdomen: soft, no masses palpated, G tube site covered in bandage. Extremities: No edema, No lymphangitis, No petechiae, No rashes, no synovitis   Data Reviewed: I have personally reviewed following labs and imaging studies Basic Metabolic Panel: Recent Labs  Lab 10/01/20 0425  NA 140  K 2.9*  CL 107  CO2 29  GLUCOSE 112*  BUN 36*  CREATININE 0.61  CALCIUM 8.6*  MG 2.4   Liver Function Tests: No results for input(s): AST, ALT, ALKPHOS, BILITOT, PROT, ALBUMIN in the last 168 hours. No results for input(s): LIPASE, AMYLASE in the last 168 hours. No results for input(s): AMMONIA in the last 168 hours.  Coagulation Profile: No results for input(s): INR, PROTIME in the last 168 hours.  CBC: Recent Labs  Lab 10/01/20 0425  WBC 13.1*  NEUTROABS 11.6*  HGB 11.4*  HCT 34.5*  MCV 95.3  PLT 360   Cardiac Enzymes: No results for input(s): CKTOTAL, CKMB, CKMBINDEX, TROPONINI in the last 168 hours. BNP: Invalid input(s): POCBNP CBG: No results for input(s): GLUCAP in the last 168 hours. HbA1C: No results for input(s): HGBA1C in the last 72 hours. Urine analysis:    Component Value Date/Time   COLORURINE YELLOW 10/01/2020 1037   APPEARANCEUR CLEAR 10/01/2020 1037   LABSPEC 1.016 10/01/2020 1037   PHURINE 5.0 10/01/2020 1037   GLUCOSEU NEGATIVE 10/01/2020 1037   HGBUR NEGATIVE 10/01/2020 1037   BILIRUBINUR NEGATIVE 10/01/2020 1037   KETONESUR NEGATIVE 10/01/2020 1037   PROTEINUR NEGATIVE 10/01/2020 1037   NITRITE NEGATIVE 10/01/2020 1037   LEUKOCYTESUR NEGATIVE 10/01/2020 1037   No results found for this or any previous visit (from the past 240 hour(s)).   Scheduled Meds:  amLODipine  5 mg Per Tube Daily   aspirin  81 mg Per Tube Daily   atorvastatin  80 mg Per Tube Daily   clopidogrel  75 mg Per  Tube Daily   free water  200 mL Per Tube Q8H   heparin  5,000 Units Subcutaneous Q8H   hydrALAZINE  5 mg Intravenous Q4H   hydrALAZINE  10 mg Oral Q8H   losartan  25 mg Per Tube Daily   mouth rinse  15 mL Mouth Rinse BID   metoprolol tartrate  5 mg Intravenous Q6H   neomycin-bacitracin-polymyxin   Topical Q1400   pneumococcal 23 valent vaccine  0.5 mL Intramuscular Tomorrow-1000   scopolamine  1 patch Transdermal Q72H   thiamine  100 mg Per Tube Daily   Continuous Infusions:  dextrose 5 % and 0.9% NaCl 55 mL/hr at 10/01/20 0503   feeding supplement (OSMOLITE 1.2 CAL)     folic acid (FOLVITE) IVPB 1 mg (10/01/20 0818)   potassium chloride 10 mEq (10/01/20 1159)    Procedures/Studies: CT ABDOMEN WO CONTRAST  Result Date: 09/21/2020 CLINICAL DATA:  Evaluate anatomy prior to potential percutaneous gastrostomy tube placement.  EXAM: CT ABDOMEN WITHOUT CONTRAST TECHNIQUE: Multidetector CT imaging of the abdomen was performed following the standard protocol without IV contrast. COMPARISON:  None. FINDINGS: The lack of intravenous contrast limits the ability to evaluate solid abdominal organs. Lower chest: Limited visualization of the lower thorax demonstrates minimal dependent subpleural atelectasis. Normal heart size. No pericardial effusion. Hepatobiliary: Normal hepatic contour. Normal noncontrast appearance of the gallbladder given degree distention. No radiopaque gallstones. There is a trace amount of perihepatic ascites. Pancreas: Normal noncontrast appearance of the pancreas. Spleen: Normal noncontrast appearance of the spleen. 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 Normal noncontrast appearance of the right kidney. No renal stones. No right-sided urinary obstruction. Normal noncontrast appearance the bilateral adrenal glands. The urinary bladder was not imaged. Stomach/Bowel: The anterior wall  the stomach is well apposed against the ventral wall of the abdomen without interposition of the hepatic parenchyma or the transverse colon. Moderate colonic stool burden without evidence of enteric obstruction. No pneumoperitoneum, pneumatosis or portal venous gas. Vascular/Lymphatic: Large amount of irregular calcified atherosclerotic plaque within a bilobed ectatic abdominal aorta with dominant caudal component measuring 2.6 cm and maximal oblique short axis coronal diameter (coronal image 33, series 5). No bulky retroperitoneal or mesenteric lymphadenopathy. Other: Regional soft tissues appear normal. Musculoskeletal: No acute or aggressive osseous abnormalities. Mild DDD of L4-L5 with disc space height loss, endplate irregularity and sclerosis. IMPRESSION: 1. Gastric anatomy amenable to potential percutaneous gastrostomy tube placement as indicated. 2. 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 3. Mildly ectatic abdominal aorta measuring 2.6 cm in maximal diameter. Recommend follow-up aortic ultrasound in 5 years. This recommendation follows ACR consensus guidelines: White Paper of the ACR Incidental Findings Committee II on Vascular Findings. J Am Coll Radiol 2013; 10:789-794. 4. Aortic aneurysm NOS (ICD10-I71.9). Electronically Signed   By: Simonne Come M.D.   On: 09/21/2020 09:36   CT Head Wo Contrast  Result Date: 09/17/2020 CLINICAL DATA:  Mental status change, found this afternoon on couch EXAM: CT HEAD WITHOUT CONTRAST TECHNIQUE: Contiguous axial images were obtained from the base of the skull through the vertex without intravenous contrast. COMPARISON:  None. FINDINGS: Brain: Foci of hypoattenuation present in the right internal capsule as as a separate focus in the right caudate and posterior limb of the left internal capsule could reflect sequela of age-indeterminate lacunar type infarcts difficult to fully assess given a background  of more diffuse patchy white matter hypoattenuation typically indicative of microvascular angiopathy in a patient of this age. Additional hypoattenuation in the pons and brainstem is more nonspecific given streak artifact across skull base. Symmetric prominence of the ventricles, cisterns and sulci compatible with parenchymal volume loss. No hyperdense hemorrhage. No mass effect or midline shift. No extra-axial collection. Scattered benign dural calcifications. Vascular: Atherosclerotic calcification of the carotid siphons. No hyperdense vessel. Skull: No calvarial fracture or suspicious osseous lesion. No scalp swelling or hematoma. Sinuses/Orbits: Paranasal sinuses and mastoid air cells are predominantly clear. Other: Included orbital structures are unremarkable. 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 Could be further characterized with MR imaging as warranted. No other acute intracranial abnormality. These results were called by telephone at the time of interpretation on 09/17/2020 at 11:45  pm to provider Benjiman Core , who verbally acknowledged these results. Electronically Signed   By: Kreg Shropshire M.D.   On: 09/17/2020 23:45   MR ANGIO HEAD WO CONTRAST  Result Date: 09/18/2020 CLINICAL DATA:  Altered mental status. EXAM: MRI HEAD WITHOUT CONTRAST MRA HEAD WITHOUT CONTRAST TECHNIQUE: Multiplanar, multi-echo pulse sequences of the brain and surrounding structures were acquired without intravenous contrast. Angiographic images of the Circle of Willis were acquired using MRA technique without intravenous contrast. COMPARISON:  No pertinent prior exam. FINDINGS: MRI HEAD FINDINGS Brain: Restricted diffusion in the bilateral thalamus,  bilateral internal capsule, left centrum semiovale, and right subcortical frontal. These have the appearance of acute infarcts. Mild small vessel ischemic type change in the pons and hemispheric white matter. No evidence of hemorrhage, hydrocephalus, or collection. Vascular: Preserved flow voids. Skull and upper cervical spine: Normal marrow signal Sinuses/Orbits: Negative Other: Significant and progressive motion artifact MRA HEAD FINDINGS Very motion degraded. Carotid, vertebral, and basilar arteries and major branches are patent with gross symmetry. IMPRESSION: Brain MRI: 1. 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 These are not in a unified arterial or venous distribution and are of uncertain cause. No revealing background chronic brain findings. 2. Significantly motion degraded Intracranial MRA: Very motion degraded and of limited utility other than documenting patency of major vessels. Electronically Signed   By: Marnee Spring M.D.   On: 09/18/2020 08:43   MR BRAIN WO CONTRAST  Result Date: 09/18/2020 CLINICAL DATA:  Altered mental status. EXAM: MRI HEAD WITHOUT CONTRAST MRA HEAD WITHOUT CONTRAST TECHNIQUE: Multiplanar, multi-echo pulse sequences of the brain and surrounding structures were acquired without intravenous contrast. Angiographic images of the Circle of Willis were acquired using MRA technique without intravenous contrast. COMPARISON:  No pertinent prior exam. FINDINGS: MRI HEAD FINDINGS Brain: Restricted diffusion in the bilateral thalamus, bilateral internal capsule, left centrum semiovale, and right subcortical frontal. These have the appearance of acute infarcts. Mild small vessel ischemic type change in the pons and hemispheric white matter. No evidence of hemorrhage, hydrocephalus, or collection. Vascular: Preserved flow voids. Skull and upper cervical spine: Normal marrow signal Sinuses/Orbits:  Negative Other: Significant and progressive motion artifact MRA HEAD FINDINGS Very motion degraded. Carotid, vertebral, and basilar arteries and major branches are patent with gross symmetry. IMPRESSION: Brain MRI: 1. 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 These are not in a unified arterial or venous distribution and are of uncertain cause. No revealing background chronic brain findings. 2. Significantly motion degraded Intracranial MRA: Very motion degraded and of limited utility other than documenting patency of major vessels. Electronically Signed   By: Marnee Spring M.D.   On: 09/18/2020 08:43   IR GASTROSTOMY TUBE MOD SED  Result Date: 09/22/2020 INDICATION: 65 year old with altered mental status and dysphagia. Request for gastrostomy tube placement. EXAM: PERCUTANEOUS GASTROSTOMY TUBE WITH FLUOROSCOPIC GUIDANCE Physician: Rachelle Hora. Lowella Dandy, MD MEDICATIONS: Ancef 2 g; Antibiotics were administered within 1 hour of the procedure. Glucagon 0.5 mg ANESTHESIA/SEDATION: Versed 2.0 mg IV; Fentanyl 100 mcg IV Moderate Sedation Time:  42 minutes The patient was continuously monitored during the procedure by the interventional radiology nurse under my direct supervision. FLUOROSCOPY TIME:  Fluoroscopy Time: 12 minutes, 6 seconds, 19 mGy CONTRAST:  20 mL Omnipaque 300 COMPLICATIONS: None immediate. PROCEDURE: Informed consent was obtained for a percutaneous gastrostomy tube. The patient was placed on the interventional table. An orogastric  tube was placed with fluoroscopic guidance. The anterior abdomen was prepped and draped in sterile fashion. Maximal barrier sterile technique was utilized including caps, mask, sterile gowns, sterile gloves, sterile drape, hand hygiene and skin antiseptic. Stomach was inflated with air through the orogastric tube. The skin and subcutaneous tissues were anesthetized with 1% lidocaine. A 17 gauge  needle was directed into the distended stomach with fluoroscopic guidance. A wire was advanced into the stomach and a T fastener was deployed. A 9-French vascular sheath was placed and the orogastric tube was snared using a Gooseneck snare device. At this time, the patient was clenching her teeth and we could not open her mouth in order to place the pull-through gastrostomy tube. As a result, we converted to a push through gastrostomy tube. Skin around the 9 French drain was anesthetized with 1% lidocaine. Using fluoroscopic guidance, 2 additional T-fasteners were deployed. The T fastener associated with the 9 French sheath was cut. The 9 French sheath was removed and dilated up to an 18 [place] peel-away sheath. A 16 French Entuit gastrostomy tube was advanced over the wire and through the peel-away sheath. The balloon was inflated with approximately 8 mL of saline. Peel-away sheath and wire were removed. Contrast injection confirmed placement in the stomach. Fluoroscopic images were obtained for documentation. The gastrostomy tube was flushed with normal saline. FINDINGS: 5818 French balloon retention gastrostomy tube is positioned in the stomach. Total of 3 T-fasteners were deployed and there are still 2 T-fasteners intact. Contrast injection confirmed placement in the stomach. IMPRESSION: 1. Successful placement of a percutaneous gastrostomy tube using fluoroscopy. This is a balloon retention gastrostomy tube. 2. There are 2 remaining T-fasteners in place. Plan to cut the T-fasteners in 7-10 days. Electronically Signed   By: Richarda OverlieAdam  Henn M.D.   On: 09/22/2020 12:07   [facility]otid [facility])  Result Date: 09/18/2020 CLINICAL DATA:  Altered mental status Hypertension Stroke EXAM: BILATERAL CAROTID DUPLEX ULTRASOUND TECHNIQUE: Wallace CullensGray scale imaging, color Doppler and duplex ultrasound were performed of bilateral carotid and vertebral arteries in the neck. COMPARISON:  None. FINDINGS: Criteria:  Quantification of carotid stenosis is based on velocity parameters that correlate the residual internal carotid diameter with NASCET-based stenosis levels, using the diameter of the distal internal carotid lumen as the denominator for stenosis measurement. The following velocity measurements were obtained: RIGHT ICA: 156/29 cm/sec CCA: 77/11 cm/sec SYSTOLIC ICA/CCA RATIO:  2.0 ECA: 173 cm/sec LEFT ICA: 83/18 cm/sec CCA: 85/14 cm/sec SYSTOLIC ICA/CCA RATIO:  1.0 ECA: 92 cm/sec RIGHT CAROTID ARTERY: Heterogeneous calcified and noncalcified plaque of the distal common and proximal internal carotid arteries with velocity indicative of 50-69% stenosis. RIGHT VERTEBRAL ARTERY:  Antegrade flow. LEFT CAROTID ARTERY: Heterogeneous calcified and noncalcified plaque of the distal common and proximal internal carotid arteries with velocity parameters indicative of less than 50% stenosis. LEFT VERTEBRAL ARTERY:  Antegrade flow. IMPRESSION: 1. 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 2. Less than 50% stenosis of the left internal carotid artery. 3. Long segment shadowing plaque present in both common and internal carotid arteries, which can obscure higher velocities. Further evaluation with MRI or CT of the neck should be performed for more precise evaluation of degree of stenosis. Electronically Signed   By: Acquanetta BellingFarhaan  Mir M.D.   On: 09/18/2020 10:01   DG CHEST PORT 1 VIEW  Result Date: 10/01/2020 CLINICAL DATA:  Altered mental status, leukocytosis.  Former smoker. EXAM: PORTABLE CHEST 1 VIEW COMPARISON:  Chest x-ray  dated 09/17/2020. FINDINGS: Heart size and mediastinal contours are stable. Lungs are hyperexpanded. Coarse lung markings are again seen bilaterally without significant interval change. No new lung findings. No pleural effusion or pneumothorax is seen. Osseous structures about the chest are unremarkable. IMPRESSION: 1. No active disease. No evidence of pneumonia or pulmonary edema. 2. Hyperexpanded  lungs indicating COPD. 3. Chronic interstitial lung disease. Electronically Signed   By: Bary Richard M.D.   On: 10/01/2020 09:44   DG Chest Portable 1 View  Result Date: 09/17/2020 CLINICAL DATA:  Altered mental status EXAM: PORTABLE CHEST 1 VIEW COMPARISON:  None FINDINGS: There is hyperinflation of the lungs compatible with COPD. Heart and mediastinal contours are within normal limits. No focal opacities or effusions. No acute bony abnormality. IMPRESSION: COPD.  No active disease. Electronically Signed   By: Charlett Nose M.D.   On: 09/17/2020 23:02   ECHOCARDIOGRAM COMPLETE  Result Date: 09/18/2020    ECHOCARDIOGRAM REPORT   Patient Name:   [facility] Date of Exam: 09/18/2020 Medical Rec #:  [id number]       Height: Accession #:    [id number]      Weight: Date of Birth:  [birth year]       BSA: Patient Age:    64 years        BP:           213/100 mmHg Patient Gender: F               HR:           59 bpm. [facility] Procedure: 2D Echo, Cardiac Doppler and Color Doppler Indications:    Stroke  History:        Patient has no prior history of Echocardiogram examinations.                 Stroke; Risk Factors:Hypertension. Height and Weight not                 available.  Sonographer:    Mikki Harbor Referring Phys: [id number] ASIA B ZIERLE-GHOSH  Sonographer Comments: Height and Weight not available IMPRESSIONS  1. Left ventricular ejection fraction, by estimation, is 60 to 65%. The left ventricle has normal function. The left ventricle has no regional wall motion abnormalities. There is mild left ventricular hypertrophy. Left ventricular diastolic parameters were normal.  2. Right ventricular systolic function is normal. The right ventricular size is normal. Tricuspid regurgitation signal is inadequate for assessing PA pressure.  3. The mitral valve is grossly normal. Trivial mitral valve regurgitation.  4. The aortic valve is tricuspid. Aortic valve regurgitation is not visualized. Mild  aortic valve sclerosis is present, with no evidence of aortic valve stenosis. Aortic valve mean gradient measures 3.0 mmHg.  5. The inferior vena cava is normal in size with greater than 50% respiratory variability, suggesting right atrial pressure of 3 mmHg. FINDINGS  Left Ventricle: Left ventricular ejection fraction, by estimation, is 60 to 65%. The left ventricle has normal function. The left ventricle has no regional wall motion abnormalities. The left ventricular internal cavity size was normal in size. There is  mild left ventricular hypertrophy. Left ventricular diastolic parameters were normal. Right Ventricle: The right ventricular size is normal. No increase in right ventricular wall thickness. Right ventricular systolic function is normal. Tricuspid regurgitation signal is inadequate for assessing PA pressure. Left Atrium: Left atrial size was normal in size. Right Atrium: Right atrial size was normal in size. Pericardium: There  is no evidence of pericardial effusion. Presence of pericardial fat pad. Mitral Valve: The mitral valve is grossly normal. Mild mitral annular calcification. Trivial mitral valve regurgitation. MV peak gradient, 2.7 mmHg. The mean mitral valve gradient is 1.0 mmHg. Tricuspid Valve: The tricuspid valve is grossly normal. Tricuspid valve regurgitation is trivial. Aortic Valve: The aortic valve is tricuspid. There is mild aortic valve annular calcification. Aortic valve regurgitation is not visualized. Mild aortic valve sclerosis is present, with no evidence of aortic valve stenosis. Aortic valve mean gradient measures 3.0 mmHg. Aortic valve peak gradient measures 5.7 mmHg. Aortic valve area, by VTI measures 2.71 cm. Pulmonic Valve: The pulmonic valve was grossly normal. Pulmonic valve regurgitation is trivial. Aorta: The aortic root is normal in size and structure. Venous: The inferior vena cava is normal in size with greater than 50% respiratory variability, suggesting right  atrial pressure of 3 mmHg. IAS/Shunts: The interatrial septum appears to be lipomatous. No atrial level shunt detected by color flow Doppler.  LEFT VENTRICLE PLAX 2D LVIDd:         4.38 cm  Diastology LVIDs:         2.91 cm  LV e' medial:    7.10 cm/s LV PW:         1.04 cm  LV E/e' medial:  11.9 LV IVS:        1.06 cm  LV e' lateral:   7.62 cm/s LVOT diam:     2.00 cm  LV E/e' lateral: 11.0 LV SV:         90 LVOT Area:     3.14 cm  RIGHT VENTRICLE RV Basal diam:  3.37 cm RV Mid diam:    3.06 cm RV S prime:     10.20 cm/s TAPSE (M-mode): 2.3 cm LEFT ATRIUM             RIGHT ATRIUM LA diam:        3.00 cm RA Area:     13.30 cm LA Vol (A2C):   49.5 ml RA Volume:   31.30 ml LA Vol (A4C):   28.7 ml LA Biplane Vol: 40.5 ml  AORTIC VALVE AV Area (Vmax):    2.85 cm AV Area (Vmean):   2.55 cm AV Area (VTI):     2.71 cm AV Vmax:           119.00 cm/s AV Vmean:          86.100 cm/s AV VTI:            0.330 m AV Peak Grad:      5.7 mmHg AV Mean Grad:      3.0 mmHg LVOT Vmax:         108.00 cm/s LVOT Vmean:        69.800 cm/s LVOT VTI:          0.285 m LVOT/AV VTI ratio: 0.86  AORTA Ao Root diam: 3.30 cm MITRAL VALVE MV Area (PHT): 3.15 cm    SHUNTS MV Area VTI:   2.54 cm    Systemic VTI:  0.29 m MV Peak grad:  2.7 mmHg    Systemic Diam: 2.00 cm MV Mean grad:  1.0 mmHg MV Vmax:       0.81 m/s MV Vmean:      41.2 cm/s MV Decel Time: 241 msec MV E velocity: 84.20 cm/s MV A velocity: 72.70 cm/s MV E/A ratio:  1.16 Nona Dell MD Electronically signed by Nona Dell MD Signature Date/Time: 09/18/2020/2:03:27  PM    Final     Standley Dakins, MD How to contact the [facility] Attending or Consulting provider 7A - 7P or covering provider during after hours 7P -7A, for this patient?  Check the care team in [facility] and look for a) attending/consulting TRH provider listed and b) the [facility] team listed Log into [email or web address] and use Brenda's universal password to access. If you do not have the password, please contact the  hospital operator. Locate the [facility] provider you are looking for under [facility] and page to a number that you can be directly reached. If you still have difficulty reaching the provider, please page the [facility] (Director on Call) for the Hospitalists listed on amion for assistance.  10/01/2020, 12:22 PM   LOS: 13 days

## 2020-10-02 ENCOUNTER — Encounter (HOSPITAL_COMMUNITY): Payer: Self-pay | Admitting: Family Medicine

## 2020-10-02 DIAGNOSIS — K029 Dental caries, unspecified: Secondary | ICD-10-CM | POA: Diagnosis present

## 2020-10-02 DIAGNOSIS — R1310 Dysphagia, unspecified: Secondary | ICD-10-CM

## 2020-10-02 DIAGNOSIS — E559 Vitamin D deficiency, unspecified: Secondary | ICD-10-CM | POA: Diagnosis present

## 2020-10-02 DIAGNOSIS — E876 Hypokalemia: Secondary | ICD-10-CM | POA: Diagnosis present

## 2020-10-02 DIAGNOSIS — I639 Cerebral infarction, unspecified: Secondary | ICD-10-CM | POA: Diagnosis not present

## 2020-10-02 DIAGNOSIS — G9341 Metabolic encephalopathy: Secondary | ICD-10-CM | POA: Diagnosis not present

## 2020-10-02 DIAGNOSIS — I10 Essential (primary) hypertension: Secondary | ICD-10-CM | POA: Diagnosis not present

## 2020-10-02 LAB — CBC WITH DIFFERENTIAL/PLATELET
Abs Immature Granulocytes: 0.1 10*3/uL — ABNORMAL HIGH (ref 0.00–0.07)
Basophils Absolute: 0 10*3/uL (ref 0.0–0.1)
Basophils Relative: 0 %
Eosinophils Absolute: 0.1 10*3/uL (ref 0.0–0.5)
Eosinophils Relative: 1 %
HCT: 35.2 % — ABNORMAL LOW (ref 36.0–46.0)
Hemoglobin: 11.8 g/dL — ABNORMAL LOW (ref 12.0–15.0)
Immature Granulocytes: 1 %
Lymphocytes Relative: 6 %
Lymphs Abs: 0.6 10*3/uL — ABNORMAL LOW (ref 0.7–4.0)
MCH: 31.6 pg (ref 26.0–34.0)
MCHC: 33.5 g/dL (ref 30.0–36.0)
MCV: 94.4 fL (ref 80.0–100.0)
Monocytes Absolute: 0.6 10*3/uL (ref 0.1–1.0)
Monocytes Relative: 6 %
Neutro Abs: 8.4 10*3/uL — ABNORMAL HIGH (ref 1.7–7.7)
Neutrophils Relative %: 86 %
Platelets: 395 10*3/uL (ref 150–400)
RBC: 3.73 MIL/uL — ABNORMAL LOW (ref 3.87–5.11)
RDW: 13.6 % (ref 11.5–15.5)
WBC: 9.8 10*3/uL (ref 4.0–10.5)
nRBC: 0 % (ref 0.0–0.2)

## 2020-10-02 LAB — BASIC METABOLIC PANEL
Anion gap: 7 (ref 5–15)
BUN: 24 mg/dL — ABNORMAL HIGH (ref 8–23)
CO2: 26 mmol/L (ref 22–32)
Calcium: 8.7 mg/dL — ABNORMAL LOW (ref 8.9–10.3)
Chloride: 112 mmol/L — ABNORMAL HIGH (ref 98–111)
Creatinine, Ser: 0.59 mg/dL (ref 0.44–1.00)
GFR, Estimated: >60 mL/min (ref >60)
Glucose, Bld: 119 mg/dL — ABNORMAL HIGH (ref 70–99)
Potassium: 3 mmol/L — ABNORMAL LOW (ref 3.5–5.1)
Sodium: 145 mmol/L (ref 135–145)

## 2020-10-02 LAB — MAGNESIUM: Magnesium: 2.3 mg/dL (ref 1.7–2.4)

## 2020-10-02 LAB — VITAMIN D 25 HYDROXY (VIT D DEFICIENCY, FRACTURES): Vit D, 25-Hydroxy: 12.29 ng/mL — ABNORMAL LOW (ref 30–100)

## 2020-10-02 MED ORDER — FENTANYL CITRATE (PF) 100 MCG/2ML IJ SOLN: 12.5000 ug | INTRAMUSCULAR | Status: AC | PRN

## 2020-10-02 MED ORDER — POTASSIUM CHLORIDE 10 MEQ/100ML IV SOLN
10.0000 meq | INTRAVENOUS | Status: AC
  Administered 2020-10-02 (×5): 10 meq via INTRAVENOUS
  Filled 2020-10-02 (×5): qty 100

## 2020-10-02 MED ORDER — FOLIC ACID 5 MG/ML IJ SOLN
1.0000 mg | Freq: Every day | INTRAMUSCULAR | Status: DC
  Administered 2020-10-02 – 2020-10-09 (×8): 1 mg via INTRAVENOUS
  Filled 2020-10-02 (×8): qty 0.2

## 2020-10-02 MED ORDER — KETOROLAC TROMETHAMINE 15 MG/ML IJ SOLN
7.5000 mg | Freq: Four times a day (QID) | INTRAMUSCULAR | Status: AC | PRN
  Administered 2020-10-02 – 2020-10-04 (×3): 7.5 mg via INTRAVENOUS
  Filled 2020-10-02 (×3): qty 1

## 2020-10-02 MED ORDER — HYDRALAZINE HCL 20 MG/ML IJ SOLN
15.0000 mg | INTRAMUSCULAR | Status: DC
  Administered 2020-10-02 – 2020-10-04 (×11): 15 mg via INTRAVENOUS
  Filled 2020-10-02 (×12): qty 1

## 2020-10-02 MED ORDER — METOPROLOL TARTRATE 5 MG/5ML IV SOLN
7.0000 mg | Freq: Four times a day (QID) | INTRAVENOUS | Status: DC
  Administered 2020-10-02 – 2020-10-04 (×8): 7 mg via INTRAVENOUS
  Filled 2020-10-02 (×9): qty 10

## 2020-10-02 NOTE — Progress Notes (Signed)
  Speech Language Pathology Treatment: Dysphagia  Patient Details Name: Kendra Lee MRN: [id number] DOB: January 23, 1956 Today's Date: 10/02/2020 Time: 9924-2683 SLP Time Calculation (min) (ACUTE ONLY): 20 min  Assessment / Plan / Recommendation Clinical Impression  Ongoing dysphagia intervention provided. Pt with improved alertness this date and was seen while sitting up in her chair. She continues to hold some secretions in her mouth, but with cues, she swallows. Oral care completed and Pt presented with trials of ice chips, water, and NTL. Pt with slight improvement in timing of swallow, however continued labial spillage and delayed coughing. Would like to pursue MBSS tomorrow to see if she can safely take something by mouth, given that she should be able to participate now. She also pulled her PEG out last week and reportedly does not want it replaced. SLP will follow tomorrow AM.  Above to RN.    HPI HPI: 65 y.o. female  with past medical history of no known medical history admitted on 09/17/2020 with acute metabolic encephalopathy, CVA acute embolic stroke. MRI shows Scattered small vessel infarcts in the bilateral thalamus,   bilateral internal capsule, and bilateral hemispheric white matter. Pt s/p PEG on Friday due to severity of recent stroke with dysphagia.      SLP Plan  Continue with current plan of care;MBS (Given some improvement, will proceed with MBSS tomorrow)       Recommendations  Diet recommendations: NPO Medication Administration: Via alternative means                Oral Care Recommendations: Oral care QID;Oral care prior to ice chip/H20;Staff/trained caregiver to provide oral care Follow up Recommendations: Skilled Nursing facility SLP Visit Diagnosis: Dysphagia, unspecified (R13.10) Plan: Continue with current plan of care;MBS (Given some improvement, will proceed with MBSS tomorrow)       Thank you,  [name], CCC-SLP [phone]                  Shaney Deckman 10/02/2020, 4:12 PM

## 2020-10-02 NOTE — Progress Notes (Signed)
Nutrition Follow up  DOCUMENTATION CODES:   Underweight  INTERVENTION:  Once PEG replaced resume tube feeding: Osmolite 1.2 @ 40 ml/hr via PEG and increase q 4hr to goal rate of 55 ml/hr.   Tube feeding regimen provides 1584 kcal (100% of needs), 73 grams of protein, and 1082 ml of H2O.    Free water flushes 30 ml before and after meds and 200 ml -TID per tube.   Recommend consider adding bowel regimen.  Request updated weight  NUTRITION DIAGNOSIS:   Inadequate oral intake related to inability to eat (recent stroke) as evidenced by NPO status. -addressed by enteral feeding  GOAL:  Patient will meet greater than or equal to 90% of their needs -MET  MONITOR:  Labs, Weight trends, Skin (plan of care regarding nutrition support)   ASSESSMENT: Patient is an underweight 65 yo female with acute encephalopathy, acute ischemic stroke.   6/29- Palliative Care addressed [place] with son.   ST evaluation today reviewed. Dysphagia-NPO for now. Risk for aspiration PNA. No family at bedside during RD visit. Talked with her nurse. If tube feeding becomes an option. Recommendations provided above. If expected to be short-term 6 weeks or less consider NGT placement.   7/1 PEG tube placed by IR  7/2 Enteral feeding initiated- Osmolite 1.2 (continuous) with goal rate 55 ml/hr.   7/5 Nursing at bedside during RD visit. She is tolerating tube feeds at goal, up in chair and more awake than during last visit. Disposition-short term rehab- planning in process. Palliative following. Continue current tube feeding regimen. Last BM????  7/7 talked with nursing this morning -pt tube feeding stopped due to abdominal pain. Last BM- small today and medium yesterday. No current complaints from patient. Will resume at lower rate.   7/8 Talked with nursing RN/NT. Patient tolerating tube feeds at current rate. Will advance to goal rate. Body weight updated today by NT- 44.7 kg.  Patient continues to be ready  medically and waiting for SNF -insurance auth.   7/11 Patient removed PEG tube after RD visit 7/8. IR at [facility] contacted to set up replacement early this week. She remains unsafe for oral intake -NPO. IV fluids provided -D5/NS @ 65 ml/hr currently.   Medications:  B-1, Lipitor.  Labs reviewed: BMP Latest Ref Rng & Units 10/02/2020 10/01/2020 09/24/2020  Glucose 70 - 99 mg/dL 119(H) 112(H) 137(H)  BUN 8 - 23 mg/dL 24(H) 36(H) 11  Creatinine 0.44 - 1.00 mg/dL 0.59 0.61 0.62  Sodium 135 - 145 mmol/L 145 140 136  Potassium 3.5 - 5.1 mmol/L 3.0(L) 2.9(L) 3.7  Chloride 98 - 111 mmol/L 112(H) 107 102  CO2 22 - 32 mmol/L $RemoveB'26 29 27  'YqkyvyyU$ Calcium 8.9 - 10.3 mg/dL 8.7(L) 8.6(L) 8.8(L)     Diet Order:   Diet Order             Diet NPO time specified Except for: Sips with Meds  Diet effective midnight                   EDUCATION NEEDS:   Not appropriate for education at this time  Skin:  Skin Assessment: Reviewed RN Assessment  Last BM:  7/10  Height:   Ht Readings from Last 1 Encounters:  09/18/20 $RemoveB'5\' 1"'uAIrsxBC$  (1.549 m)    Weight:   Wt Readings from Last 1 Encounters:  09/18/20 42.1 kg    Ideal Body Weight:   48 kg  BMI:  Body mass index is 17.54 kg/m.  Estimated Nutritional  Needs:   Kcal:  2683-4196  Protein:  65-72 gr  Fluid:  >1200 ml daily   Colman Cater MS,RD,CSG,LDN Contact: Shea Evans

## 2020-10-02 NOTE — Progress Notes (Signed)
Physical Therapy Treatment Patient Details Name: Kendra Lee MRN: [id number] DOB: Feb 20, 1956 Today's Date: 10/02/2020    History of Present Illness Kendra Lee  is a 65 y.o. female, with no known medical history, who presents to the ED with altered mental status. History is quite limited 2/2 patient's non verbal status. Earlier, family was able to provide some history to the ED provider. They reported that the patient's last known normal was 6/24. She has been somnolent, and not taking care of herself in that interval. Family was not aware of an EtOH or illicit drug use. They are not aware of an trauma, and there are not signs of trauma on exam. Family reported no infectious symptoms or fevers.    PT Comments    Patient presents alert, agreeable for therapy, attempts to speak, but difficult to understand and follows directions consistently.  Patient demonstrates slow labored movement for sitting up at bedside, increased endurance/distance for taking steps at bedside with slow labored unsteady movement, limited mostly due to fatigue and able to transfer to [facility] to have a bowel movement.  Patient tolerated sitting up in chair after therapy - nursing staff aware.  Patient will benefit from continued physical therapy in hospital and recommended venue below to increase strength, balance, endurance for safe ADLs and gait.    Follow Up Recommendations  SNF     Equipment Recommendations  None recommended by PT    Recommendations for Other Services       Precautions / Restrictions Precautions Precautions: Fall Restrictions Weight Bearing Restrictions: No    Mobility  Bed Mobility Overal bed mobility: Needs Assistance Bed Mobility: Supine to Sit     Supine to sit: Mod assist     General bed mobility comments: increased time, labored movement    Transfers Overall transfer level: Needs assistance Equipment used: Rolling walker (2 wheeled) Transfers: Sit to/from [employer] Sit to Stand: Mod assist Stand pivot transfers: Mod assist       General transfer comment: slow labored unsteady movement  Ambulation/Gait Ambulation/Gait assistance: Mod assist Gait Distance (Feet): 6 Feet Assistive device: Rolling walker (2 wheeled) Gait Pattern/deviations: Decreased step length - right;Decreased step length - left;Decreased stride length Gait velocity: decreased   General Gait Details: increased endurance/distance for taking steps at bedside with slow labored cadence, limited due to fatigue and generalized weakness   Stairs             Wheelchair Mobility    Modified Rankin (Stroke Patients Only)       Balance Overall balance assessment: Needs assistance Sitting-balance support: Feet supported;No upper extremity supported Sitting balance-Leahy Scale: Fair Sitting balance - Comments: fair/good seated at EOB   Standing balance support: During functional activity;Bilateral upper extremity supported Standing balance-Leahy Scale: Poor Standing balance comment: fair/poor using RW                            Cognition Arousal/Alertness: Awake/alert Behavior During Therapy: WFL for tasks assessed/performed Overall Cognitive Status: Within Functional Limits for tasks assessed                                        Exercises General Exercises - Lower Extremity Long Arc Quad: Seated;AROM;Strengthening;Both;10 reps Hip Flexion/Marching: Seated;AROM;Strengthening;Both;10 reps Toe Raises: Seated;AROM;Strengthening;Both;10 reps Heel Raises: Seated;AROM;Strengthening;Both;10 reps    General Comments  Pertinent Vitals/Pain Pain Assessment: Faces Faces Pain Scale: Hurts a little bit Pain Location: over buttocks when being wiped Pain Descriptors / Indicators: Grimacing;Sore Pain Intervention(s): Limited activity within patient's tolerance;Monitored during session;Repositioned    Home Living                       Prior Function            PT Goals (current goals can now be found in the care plan section) Acute Rehab PT Goals Patient Stated Goal: return home PT Goal Formulation: With patient Time For Goal Achievement: 10/09/20 Potential to Achieve Goals: Good Progress towards PT goals: Progressing toward goals    Frequency    Min 3X/week      PT Plan Current plan remains appropriate    Co-evaluation              AM-PAC PT "6 Clicks" Mobility   Outcome Measure  Help needed turning from your back to your side while in a flat bed without using bedrails?: A Little Help needed moving from lying on your back to sitting on the side of a flat bed without using bedrails?: A Little Help needed moving to and from a bed to a chair (including a wheelchair)?: A Lot Help needed standing up from a chair using your arms (e.g., wheelchair or bedside chair)?: A Lot Help needed to walk in hospital room?: A Lot Help needed climbing 3-5 steps with a railing? : A Lot 6 Click Score: 14    End of Session   Activity Tolerance: Patient tolerated treatment well;Patient limited by fatigue Patient left: in chair;with call bell/phone within reach;with chair alarm set Nurse Communication: Mobility status PT Visit Diagnosis: Unsteadiness on feet (R26.81);Other abnormalities of gait and mobility (R26.89);Muscle weakness (generalized) (M62.81);Other symptoms and signs involving the nervous system (R29.898)     Time: 1941-7408 PT Time Calculation (min) (ACUTE ONLY): 28 min  Charges:  $Therapeutic Exercise: 8-22 mins $Therapeutic Activity: 8-22 mins                     3:08 PM, 10/02/20 Ocie Bob, MPT Physical Therapist with [facility] [phone] office [phone] mobile phone

## 2020-10-02 NOTE — Progress Notes (Signed)
Called IR at [facility] to notify them and check if they are aware of patient for procedure at their facility. Staff member stated that they are aware of patient, but nothing scheduled for the patient at this time, due to to previous cases.

## 2020-10-02 NOTE — Progress Notes (Signed)
Patient had a good BM around 1800 today.

## 2020-10-02 NOTE — Progress Notes (Signed)
PROGRESS NOTE  Kendra Lee NWG:[id number] DOB: [birth year] DOA: 09/17/2020 PCP: Pcp, No  Brief History:  65 y.o. female, with no known medical history, who presents to the ED with altered mental status. History is quite limited 2/2 patient's acute encephalopathy. Earlier, family was able to provide some history to the ED provider. They reported that the patient's last known normal was 6/24. She has been somnolent, and not taking care of herself in that interval. Family was not aware of an EtOH or illicit drug use. They are not aware of an trauma, and there are not signs of trauma on exam. Family reported no infectious symptoms or fevers.  Work up revealed patient suffered bilateral thalamic infarcts that have contributed to her encephalopathy.  After a goals of care discussion, patient's son wanted full scope of care.  Patient failed swallow eval and PEG tube was placed 09/22/20 and enteral feeding was started and titrated to goal rate without difficulty.  ED: Vitals stable, encephalopathic, CT head shows focal regions of hypoattenuation seen in the genu of the right internal capsule, posterior limb of the left internal capsule and right thalamus which could reflect age-indeterminate infarction.  No other acute abnormality   Assessment/Plan: Devastating Acute ischemic stroke/Acute metabolic Encephalopathy -Dr. Arbutus Leas discussed with neurology, Dr. Eligah East most likely due to stroke -encephalopathy seems to be resolved at this time.  -MR brain--scattered bilateral infarcts of thalamus -carotid US--R-ICA--50-69%; L-ICA <50% -MRA brain--no LVO -Echo (TTE)--EF 60-65%, no WMA, no PFO -A1C--5.5 -LDL--138 -continue ASA and plavix -discussed with cardiology--not a good candidate for TEE nor loop recorder -TSH--1.927 -ammonia 15>>26 -B12--262 -folate 4.8 -UA--recheck ordered 7/10   Hyperlipidemia -continue statin   Severe Dysphagia with high aspiration risk -speech  eval-->not safe for po intake -IR placed PEG 09/22/20 -started enteral feeding 7/2 -tolerated Osmolite 1.2 @ 40 cc/hr -Free Water 200 cc every 8 hours -PT REMOVED PEG ON 7/8 -IR planning to replace early next week -IV dextrose infusion ordered for free water and glucose  Leukocytosis - check CXR, urinalysis    Hypertension - uncontrolled -allowed permissive hypertension initially -since PEG was removed by patient we cannot give oral BP meds -I have ordered IV BP meds until patient can have a new PEG placed. -added clonidine patch 7/10 -increased hydralazine to 15 mg every 4 hours, increased metoprolol to 7 mg every 6 hours.     Hypokalemia -IV replacement ordered 7/10, additional IV replacement 7/11    Tobacco abuse -she was smoking up to 2 ppd prior to admssion -cessation discussed   Generalized weakness and debilitation  - B12 is WNL - check 25-OH vitamin D --->12.29.  ADD VIT D SUPP WHEN PEG IN PLACE  Status is: Inpatient  Remains inpatient appropriate because:Persistent severe electrolyte disturbances  Dispo: The patient is from: Home              Anticipated d/c is to: SNF              Patient currently is medically stable to d/c.   Difficult to place patient No  Family Communication:  no family present for last several days during rounds  Consultants:  neurology  Code Status:  FULL  DVT Prophylaxis:  [place] Heparin   Procedures: As Listed in Progress Note Above  Antibiotics: None  Subjective: Pt had no specific complaints today.    Objective: Vitals:   10/02/20 0042 10/02/20 0220 10/02/20 0500 10/02/20 1100  BP: Marland Kitchen)  165/85 (!) 177/81 (!) 183/80 (!) 184/82  Pulse: 82 80 86 81  Resp: 16 18 20 20   Temp: 98.6 F (37 C)     TempSrc: Oral     SpO2:    96%  Weight:      Height:        Intake/Output Summary (Last 24 hours) at 10/02/2020 1216 Last data filed at 10/02/2020 0900 Gross per 24 hour  Intake 2008.12 ml  Output 300 ml  Net 1708.12 ml    Weight change:  Exam:  General:  very thin, emaciated chronically ill appearing, somnolent but arousable, severe dental caries.   HEENT: No icterus, No thrush, No neck mass, [place]/AT Cardiovascular: distant S1/S2, no rubs, no gallops Respiratory: bibasilar rales. No wheeze Abdomen: soft, no masses palpated, G tube site covered in bandage. Extremities: No edema, No lymphangitis, No petechiae, No rashes, no synovitis  Data Reviewed: I have personally reviewed following labs and imaging studies Basic Metabolic Panel: Recent Labs  Lab 10/01/20 0425 10/02/20 0620  NA 140 145  K 2.9* 3.0*  CL 107 112*  CO2 29 26  GLUCOSE 112* 119*  BUN 36* 24*  CREATININE 0.61 0.59  CALCIUM 8.6* 8.7*  MG 2.4 2.3   Liver Function Tests: No results for input(s): AST, ALT, ALKPHOS, BILITOT, PROT, ALBUMIN in the last 168 hours. No results for input(s): LIPASE, AMYLASE in the last 168 hours. No results for input(s): AMMONIA in the last 168 hours.  Coagulation Profile: No results for input(s): INR, PROTIME in the last 168 hours.  CBC: Recent Labs  Lab 10/01/20 0425 10/02/20 0620  WBC 13.1* 9.8  NEUTROABS 11.6* 8.4*  HGB 11.4* 11.8*  HCT 34.5* 35.2*  MCV 95.3 94.4  PLT 360 395   Cardiac Enzymes: No results for input(s): CKTOTAL, CKMB, CKMBINDEX, TROPONINI in the last 168 hours. BNP: Invalid input(s): POCBNP CBG: No results for input(s): GLUCAP in the last 168 hours. HbA1C: No results for input(s): HGBA1C in the last 72 hours. Urine analysis:    Component Value Date/Time   COLORURINE YELLOW 10/01/2020 1037   APPEARANCEUR CLEAR 10/01/2020 1037   LABSPEC 1.016 10/01/2020 1037   PHURINE 5.0 10/01/2020 1037   GLUCOSEU NEGATIVE 10/01/2020 1037   HGBUR NEGATIVE 10/01/2020 1037   BILIRUBINUR NEGATIVE 10/01/2020 1037   KETONESUR NEGATIVE 10/01/2020 1037   PROTEINUR NEGATIVE 10/01/2020 1037   NITRITE NEGATIVE 10/01/2020 1037   LEUKOCYTESUR NEGATIVE 10/01/2020 1037   No results found  for this or any previous visit (from the past 240 hour(s)).   Scheduled Meds:  amLODipine  5 mg Per Tube Daily   aspirin  81 mg Per Tube Daily   atorvastatin  80 mg Per Tube Daily   cloNIDine  0.1 mg Transdermal Weekly   clopidogrel  75 mg Per Tube Daily   free water  200 mL Per Tube Q8H   heparin  5,000 Units Subcutaneous Q8H   hydrALAZINE  10 mg Intravenous Q4H   hydrALAZINE  10 mg Oral Q8H   losartan  25 mg Per Tube Daily   mouth rinse  15 mL Mouth Rinse BID   metoprolol tartrate  5 mg Intravenous Q6H   pneumococcal 23 valent vaccine  0.5 mL Intramuscular Tomorrow-1000   scopolamine  1 patch Transdermal Q72H   thiamine  100 mg Per Tube Daily   Continuous Infusions:  dextrose 5 % and 0.9% NaCl 65 mL/hr at 10/02/20 0848   feeding supplement (OSMOLITE 1.2 CAL)     folic acid (  FOLVITE) IVPB 1 mg (10/01/20 0818)   potassium chloride 10 mEq (10/02/20 1106)    Procedures/Studies: CT ABDOMEN WO CONTRAST  Result Date: 09/21/2020 CLINICAL DATA:  Evaluate anatomy prior to potential percutaneous gastrostomy tube placement. EXAM: CT ABDOMEN WITHOUT CONTRAST TECHNIQUE: Multidetector CT imaging of the abdomen was performed following the standard protocol without IV contrast. COMPARISON:  None. FINDINGS: The lack of intravenous contrast limits the ability to evaluate solid abdominal organs. Lower chest: Limited visualization of the lower thorax demonstrates minimal dependent subpleural atelectasis. Normal heart size. No pericardial effusion. Hepatobiliary: Normal hepatic contour. Normal noncontrast appearance of the gallbladder given degree distention. No radiopaque gallstones. There is a trace amount of perihepatic ascites. Pancreas: Normal noncontrast appearance of the pancreas. Spleen: Normal noncontrast appearance of the spleen. 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 Normal noncontrast appearance of  the right kidney. No renal stones. No right-sided urinary obstruction. Normal noncontrast appearance the bilateral adrenal glands. The urinary bladder was not imaged. Stomach/Bowel: The anterior wall the stomach is well apposed against the ventral wall of the abdomen without interposition of the hepatic parenchyma or the transverse colon. Moderate colonic stool burden without evidence of enteric obstruction. No pneumoperitoneum, pneumatosis or portal venous gas. Vascular/Lymphatic: Large amount of irregular calcified atherosclerotic plaque within a bilobed ectatic abdominal aorta with dominant caudal component measuring 2.6 cm and maximal oblique short axis coronal diameter (coronal image 33, series 5). No bulky retroperitoneal or mesenteric lymphadenopathy. Other: Regional soft tissues appear normal. Musculoskeletal: No acute or aggressive osseous abnormalities. Mild DDD of L4-L5 with disc space height loss, endplate irregularity and sclerosis. IMPRESSION: 1. Gastric anatomy amenable to potential percutaneous gastrostomy tube placement as indicated. 2. 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 3. Mildly ectatic abdominal aorta measuring 2.6 cm in maximal diameter. Recommend follow-up aortic ultrasound in 5 years. This recommendation follows ACR consensus guidelines: White Paper of the ACR Incidental Findings Committee II on Vascular Findings. J Am Coll Radiol 2013; 10:789-794. 4. Aortic aneurysm NOS (ICD10-I71.9). Electronically Signed   By: Simonne Come M.D.   On: 09/21/2020 09:36   CT Head Wo Contrast  Result Date: 09/17/2020 CLINICAL DATA:  Mental status change, found this afternoon on couch EXAM: CT HEAD WITHOUT CONTRAST TECHNIQUE: Contiguous axial images were obtained from the base of the skull through the vertex without intravenous contrast. COMPARISON:  None. FINDINGS: Brain: Foci of hypoattenuation present in the right internal capsule as  as a separate focus in the right caudate and posterior limb of the left internal capsule could reflect sequela of age-indeterminate lacunar type infarcts difficult to fully assess given a background of more diffuse patchy white matter hypoattenuation typically indicative of microvascular angiopathy in a patient of this age. Additional hypoattenuation in the pons and brainstem is more nonspecific given streak artifact across skull base. Symmetric prominence of the ventricles, cisterns and sulci compatible with parenchymal volume loss. No hyperdense hemorrhage. No mass effect or midline shift. No extra-axial collection. Scattered benign dural calcifications. Vascular: Atherosclerotic calcification of the carotid siphons. No hyperdense vessel. Skull: No calvarial fracture or suspicious osseous lesion. No scalp swelling or hematoma. Sinuses/Orbits: Paranasal sinuses and mastoid air cells are predominantly clear. Other: Included orbital structures are unremarkable. 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 Could be further characterized with MR imaging as warranted. No other acute intracranial abnormality. These results were called by telephone at the time of interpretation on 09/17/2020 at 11:45 pm to provider Benjiman Core , who verbally acknowledged these results. Electronically Signed   By: Kreg Shropshire M.D.   On: 09/17/2020 23:45   MR ANGIO HEAD WO CONTRAST  Result Date: 09/18/2020 CLINICAL DATA:  Altered mental status. EXAM: MRI HEAD WITHOUT CONTRAST MRA HEAD WITHOUT CONTRAST TECHNIQUE: Multiplanar, multi-echo pulse sequences of the brain and surrounding structures were acquired without intravenous contrast. Angiographic images of the  Circle of Willis were acquired using MRA technique without intravenous contrast. COMPARISON:  No pertinent prior exam. FINDINGS: MRI HEAD FINDINGS Brain: Restricted diffusion in the bilateral thalamus, bilateral internal capsule, left centrum semiovale, and right subcortical frontal. These have the appearance of acute infarcts. Mild small vessel ischemic type change in the pons and hemispheric white matter. No evidence of hemorrhage, hydrocephalus, or collection. Vascular: Preserved flow voids. Skull and upper cervical spine: Normal marrow signal Sinuses/Orbits: Negative Other: Significant and progressive motion artifact MRA HEAD FINDINGS Very motion degraded. Carotid, vertebral, and basilar arteries and major branches are patent with gross symmetry. IMPRESSION: Brain MRI: 1. 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 These are not in a unified arterial or venous distribution and are of uncertain cause. No revealing background chronic brain findings. 2. Significantly motion degraded Intracranial MRA: Very motion degraded and of limited utility other than documenting patency of major vessels. Electronically Signed   By: Marnee Spring M.D.   On: 09/18/2020 08:43   MR BRAIN WO CONTRAST  Result Date: 09/18/2020 CLINICAL DATA:  Altered mental status. EXAM: MRI HEAD WITHOUT CONTRAST MRA HEAD WITHOUT CONTRAST TECHNIQUE: Multiplanar, multi-echo pulse sequences of the brain and surrounding structures were acquired without intravenous contrast. Angiographic images of the Circle of Willis were acquired using MRA technique without intravenous contrast. COMPARISON:  No pertinent prior exam. FINDINGS: MRI HEAD FINDINGS Brain: Restricted diffusion in the bilateral thalamus, bilateral internal capsule, left centrum semiovale, and right subcortical frontal. These have the appearance of acute infarcts. Mild small vessel ischemic type change in  the pons and hemispheric white matter. No evidence of hemorrhage, hydrocephalus, or collection. Vascular: Preserved flow voids. Skull and upper cervical spine: Normal marrow signal Sinuses/Orbits: Negative Other: Significant and progressive motion artifact MRA HEAD FINDINGS Very motion degraded. Carotid, vertebral, and basilar arteries and major branches are patent with gross symmetry. IMPRESSION: Brain MRI: 1. 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 These are not in a unified arterial or venous distribution and are of uncertain cause. No revealing background chronic brain findings. 2. Significantly motion degraded Intracranial MRA: Very motion degraded and of limited utility other than documenting patency of major vessels. Electronically Signed   By: Marnee Spring M.D.   On: 09/18/2020 08:43   IR GASTROSTOMY TUBE MOD SED  Result Date: 09/22/2020 INDICATION: 65 year old with altered mental status and dysphagia. Request for gastrostomy tube placement. EXAM: PERCUTANEOUS GASTROSTOMY TUBE WITH FLUOROSCOPIC GUIDANCE Physician: Rachelle Hora. Lowella Dandy, MD MEDICATIONS: Ancef 2 g; Antibiotics were administered within 1 hour of the procedure. Glucagon 0.5 mg ANESTHESIA/SEDATION: Versed 2.0 mg IV; Fentanyl 100 mcg IV Moderate Sedation Time:  42 minutes The patient was continuously monitored during the procedure by the interventional radiology nurse under my direct supervision. FLUOROSCOPY TIME:  Fluoroscopy Time: 12 minutes, 6 seconds, 19 mGy CONTRAST:  20 mL Omnipaque 300 COMPLICATIONS: None immediate. PROCEDURE: Informed consent was obtained for a percutaneous gastrostomy tube. The patient was placed on the interventional table. An orogastric tube was placed with fluoroscopic guidance. The anterior abdomen was prepped and draped in sterile fashion. Maximal barrier sterile technique was utilized including caps, mask, sterile gowns, sterile  gloves, sterile drape, hand hygiene and skin antiseptic. Stomach was inflated with air through the orogastric tube. The skin and subcutaneous tissues were anesthetized with 1% lidocaine. A 17 gauge needle was directed into the distended stomach with fluoroscopic guidance. A wire was advanced into the stomach and a T fastener was deployed. A 9-French vascular sheath was placed and the orogastric tube was snared using a Gooseneck snare device. At this time, the patient was clenching her teeth and we could not open her mouth in order to place the pull-through gastrostomy tube. As a result, we converted to a push through gastrostomy tube. Skin around the 9 French drain was anesthetized with 1% lidocaine. Using fluoroscopic guidance, 2 additional T-fasteners were deployed. The T fastener associated with the 9 French sheath was cut. The 9 French sheath was removed and dilated up to an 18 [place] peel-away sheath. A 16 French Entuit gastrostomy tube was advanced over the wire and through the peel-away sheath. The balloon was inflated with approximately 8 mL of saline. Peel-away sheath and wire were removed. Contrast injection confirmed placement in the stomach. Fluoroscopic images were obtained for documentation. The gastrostomy tube was flushed with normal saline. FINDINGS: 32 French balloon retention gastrostomy tube is positioned in the stomach. Total of 3 T-fasteners were deployed and there are still 2 T-fasteners intact. Contrast injection confirmed placement in the stomach. IMPRESSION: 1. Successful placement of a percutaneous gastrostomy tube using fluoroscopy. This is a balloon retention gastrostomy tube. 2. There are 2 remaining T-fasteners in place. Plan to cut the T-fasteners in 7-10 days. Electronically Signed   By: Richarda Overlie M.D.   On: 09/22/2020 12:07   US Carotid Bilateral (at [facility] and [facility] only)  Result Date: 09/18/2020 CLINICAL DATA:  Altered mental status Hypertension Stroke EXAM: BILATERAL CAROTID  DUPLEX ULTRASOUND TECHNIQUE: Wallace Cullens scale imaging, color Doppler and duplex ultrasound were performed of bilateral carotid and vertebral arteries in the neck. COMPARISON:  None. FINDINGS: Criteria: Quantification of carotid stenosis is based on velocity parameters that correlate the residual internal carotid diameter with NASCET-based stenosis levels, using the diameter of the distal internal carotid lumen as the denominator for stenosis measurement. The following velocity measurements were obtained: RIGHT ICA: 156/29 cm/sec CCA: 77/11 cm/sec SYSTOLIC ICA/CCA RATIO:  2.0 ECA: 173 cm/sec LEFT ICA: 83/18 cm/sec CCA: 85/14 cm/sec SYSTOLIC ICA/CCA RATIO:  1.0 ECA: 92 cm/sec RIGHT CAROTID ARTERY: Heterogeneous calcified and noncalcified plaque of the distal common and proximal internal carotid arteries with velocity indicative of 50-69% stenosis. RIGHT VERTEBRAL ARTERY:  Antegrade flow. LEFT CAROTID ARTERY: Heterogeneous calcified and noncalcified plaque of the distal common and proximal internal carotid arteries with velocity parameters indicative of less than 50% stenosis. LEFT VERTEBRAL ARTERY:  Antegrade flow. IMPRESSION: 1. 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 2. Less than 50% stenosis of the left internal carotid artery. 3. Long segment shadowing plaque present in both common and internal carotid arteries, which can obscure higher velocities. Further evaluation with MRI or CT of the neck should be performed for more precise evaluation of degree of stenosis. Electronically Signed   By: Mauri Reading  Mir M.D.   On: 09/18/2020 10:01   DG CHEST PORT 1 VIEW  Result Date: 10/01/2020 CLINICAL DATA:  Altered mental status, leukocytosis.  Former smoker. EXAM: PORTABLE CHEST 1 VIEW COMPARISON:  Chest x-ray dated 09/17/2020. FINDINGS: Heart size and mediastinal contours are stable. Lungs are hyperexpanded. Coarse lung markings are again seen bilaterally without significant interval change. No new lung findings.  No pleural effusion or pneumothorax is seen. Osseous structures about the chest are unremarkable. IMPRESSION: 1. No active disease. No evidence of pneumonia or pulmonary edema. 2. Hyperexpanded lungs indicating COPD. 3. Chronic interstitial lung disease. Electronically Signed   By: Bary Richard M.D.   On: 10/01/2020 09:44   DG Chest Portable 1 View  Result Date: 09/17/2020 CLINICAL DATA:  Altered mental status EXAM: PORTABLE CHEST 1 VIEW COMPARISON:  None FINDINGS: There is hyperinflation of the lungs compatible with COPD. Heart and mediastinal contours are within normal limits. No focal opacities or effusions. No acute bony abnormality. IMPRESSION: COPD.  No active disease. Electronically Signed   By: Charlett Nose M.D.   On: 09/17/2020 23:02   ECHOCARDIOGRAM COMPLETE  Result Date: 09/18/2020    ECHOCARDIOGRAM REPORT   Patient Name:   [facility] Date of Exam: 09/18/2020 Medical Rec #:  [id number]       Height: Accession #:    [id number]      Weight: Date of Birth:  1955-11-04       BSA: Patient Age:    64 years        BP:           213/100 mmHg Patient Gender: F               HR:           59 bpm. [facility] Procedure: 2D Echo, Cardiac Doppler and Color Doppler Indications:    Stroke  History:        Patient has no prior history of Echocardiogram examinations.                 Stroke; Risk Factors:Hypertension. Height and Weight not                 available.  Sonographer:    Mikki Harbor Referring Phys: [id number] ASIA B ZIERLE-GHOSH  Sonographer Comments: Height and Weight not available IMPRESSIONS  1. Left ventricular ejection fraction, by estimation, is 60 to 65%. The left ventricle has normal function. The left ventricle has no regional wall motion abnormalities. There is mild left ventricular hypertrophy. Left ventricular diastolic parameters were normal.  2. Right ventricular systolic function is normal. The right ventricular size is normal. Tricuspid regurgitation signal is  inadequate for assessing PA pressure.  3. The mitral valve is grossly normal. Trivial mitral valve regurgitation.  4. The aortic valve is tricuspid. Aortic valve regurgitation is not visualized. Mild aortic valve sclerosis is present, with no evidence of aortic valve stenosis. Aortic valve mean gradient measures 3.0 mmHg.  5. The inferior vena cava is normal in size with greater than 50% respiratory variability, suggesting right atrial pressure of 3 mmHg. FINDINGS  Left Ventricle: Left ventricular ejection fraction, by estimation, is 60 to 65%. The left ventricle has normal function. The left ventricle has no regional wall motion abnormalities. The left ventricular internal cavity size was normal in size. There is  mild left ventricular hypertrophy. Left ventricular diastolic parameters were normal. Right Ventricle: The right ventricular size is normal. No increase in right ventricular  wall thickness. Right ventricular systolic function is normal. Tricuspid regurgitation signal is inadequate for assessing PA pressure. Left Atrium: Left atrial size was normal in size. Right Atrium: Right atrial size was normal in size. Pericardium: There is no evidence of pericardial effusion. Presence of pericardial fat pad. Mitral Valve: The mitral valve is grossly normal. Mild mitral annular calcification. Trivial mitral valve regurgitation. MV peak gradient, 2.7 mmHg. The mean mitral valve gradient is 1.0 mmHg. Tricuspid Valve: The tricuspid valve is grossly normal. Tricuspid valve regurgitation is trivial. Aortic Valve: The aortic valve is tricuspid. There is mild aortic valve annular calcification. Aortic valve regurgitation is not visualized. Mild aortic valve sclerosis is present, with no evidence of aortic valve stenosis. Aortic valve mean gradient measures 3.0 mmHg. Aortic valve peak gradient measures 5.7 mmHg. Aortic valve area, by VTI measures 2.71 cm. Pulmonic Valve: The pulmonic valve was grossly normal. Pulmonic valve  regurgitation is trivial. Aorta: The aortic root is normal in size and structure. Venous: The inferior vena cava is normal in size with greater than 50% respiratory variability, suggesting right atrial pressure of 3 mmHg. IAS/Shunts: The interatrial septum appears to be lipomatous. No atrial level shunt detected by color flow Doppler.  LEFT VENTRICLE PLAX 2D LVIDd:         4.38 cm  Diastology LVIDs:         2.91 cm  LV e' medial:    7.10 cm/s LV PW:         1.04 cm  LV E/e' medial:  11.9 LV IVS:        1.06 cm  LV e' lateral:   7.62 cm/s LVOT diam:     2.00 cm  LV E/e' lateral: 11.0 LV SV:         90 LVOT Area:     3.14 cm  RIGHT VENTRICLE RV Basal diam:  3.37 cm RV Mid diam:    3.06 cm RV S prime:     10.20 cm/s TAPSE (M-mode): 2.3 cm LEFT ATRIUM             RIGHT ATRIUM LA diam:        3.00 cm RA Area:     13.30 cm LA Vol (A2C):   49.5 ml RA Volume:   31.30 ml LA Vol (A4C):   28.7 ml LA Biplane Vol: 40.5 ml  AORTIC VALVE AV Area (Vmax):    2.85 cm AV Area (Vmean):   2.55 cm AV Area (VTI):     2.71 cm AV Vmax:           119.00 cm/s AV Vmean:          86.100 cm/s AV VTI:            0.330 m AV Peak Grad:      5.7 mmHg AV Mean Grad:      3.0 mmHg LVOT Vmax:         108.00 cm/s LVOT Vmean:        69.800 cm/s LVOT VTI:          0.285 m LVOT/AV VTI ratio: 0.86  AORTA Ao Root diam: 3.30 cm MITRAL VALVE MV Area (PHT): 3.15 cm    SHUNTS MV Area VTI:   2.54 cm    Systemic VTI:  0.29 m MV Peak grad:  2.7 mmHg    Systemic Diam: 2.00 cm MV Mean grad:  1.0 mmHg MV Vmax:       0.81 m/s MV Vmean:  41.2 cm/s MV Decel Time: 241 msec MV E velocity: 84.20 cm/s MV A velocity: 72.70 cm/s MV E/A ratio:  1.16 Nona DellSamuel Mcdowell MD Electronically signed by Nona DellSamuel Mcdowell MD Signature Date/Time: 09/18/2020/2:03:27 PM    Final     Standley Dakinslanford Sharalyn Lomba, MD How to contact the [facility] Attending or Consulting provider 7A - 7P or covering provider during after hours 7P -7A, for this patient?  Check the care team in [facility] and look for a)  attending/consulting [facility] provider listed and b) the [facility] team listed Log into [email or web address] and use [place]'s universal password to access. If you do not have the password, please contact the hospital operator. Locate the [facility] provider you are looking for under [facility] and page to a number that you can be directly reached. If you still have difficulty reaching the provider, please page the [facility] (Director on Call) for the Hospitalists listed on amion for assistance.  10/02/2020, 12:16 PM   LOS: 14 days

## 2020-10-03 ENCOUNTER — Inpatient Hospital Stay (HOSPITAL_COMMUNITY): Payer: Medicare Other

## 2020-10-03 DIAGNOSIS — R1310 Dysphagia, unspecified: Secondary | ICD-10-CM

## 2020-10-03 DIAGNOSIS — G9341 Metabolic encephalopathy: Secondary | ICD-10-CM | POA: Diagnosis not present

## 2020-10-03 DIAGNOSIS — I10 Essential (primary) hypertension: Secondary | ICD-10-CM | POA: Diagnosis not present

## 2020-10-03 DIAGNOSIS — I639 Cerebral infarction, unspecified: Secondary | ICD-10-CM | POA: Diagnosis not present

## 2020-10-03 DIAGNOSIS — Z7189 Other specified counseling: Secondary | ICD-10-CM | POA: Diagnosis not present

## 2020-10-03 LAB — BASIC METABOLIC PANEL
Anion gap: 7 (ref 5–15)
BUN: 18 mg/dL (ref 8–23)
CO2: 25 mmol/L (ref 22–32)
Calcium: 8.5 mg/dL — ABNORMAL LOW (ref 8.9–10.3)
Chloride: 113 mmol/L — ABNORMAL HIGH (ref 98–111)
Creatinine, Ser: 0.61 mg/dL (ref 0.44–1.00)
GFR, Estimated: >60 mL/min (ref >60)
Glucose, Bld: 109 mg/dL — ABNORMAL HIGH (ref 70–99)
Potassium: 2.9 mmol/L — ABNORMAL LOW (ref 3.5–5.1)
Sodium: 145 mmol/L (ref 135–145)

## 2020-10-03 MED ORDER — CLONIDINE HCL 0.2 MG/24HR TD PTWK
0.2000 mg | MEDICATED_PATCH | TRANSDERMAL | Status: DC
  Administered 2020-10-08: 0.2 mg via TRANSDERMAL
  Filled 2020-10-03: qty 1

## 2020-10-03 MED ORDER — POTASSIUM CHLORIDE 10 MEQ/100ML IV SOLN
10.0000 meq | INTRAVENOUS | Status: AC
  Administered 2020-10-03 (×5): 10 meq via INTRAVENOUS
  Filled 2020-10-03 (×6): qty 100

## 2020-10-03 MED ORDER — ASPIRIN 300 MG RE SUPP
150.0000 mg | Freq: Every day | RECTAL | Status: DC
  Administered 2020-10-03: 150 mg via RECTAL

## 2020-10-03 MED ORDER — CLONIDINE HCL 0.2 MG/24HR TD PTWK: 0.2000 mg | MEDICATED_PATCH | TRANSDERMAL | Status: DC

## 2020-10-03 NOTE — Plan of Care (Signed)
  Problem: Education: Goal: Knowledge of General Education information will improve Description Including pain rating scale, medication(s)/side effects and non-pharmacologic comfort measures Outcome: Progressing   Problem: Health Behavior/Discharge Planning: Goal: Ability to manage health-related needs will improve Outcome: Progressing   

## 2020-10-03 NOTE — Evaluation (Signed)
Modified Barium Swallow Progress Note  Patient Details  Name: Kendra Lee MRN: [id number] Date of Birth: [birth year]  Today's Date: 10/03/2020  Modified Barium Swallow completed.  Full report located under Chart Review in the Imaging Section.  Brief recommendations include the following:  Clinical Impression  Pt presents with moderate/severe sensorimotor oropharyngeal dysphagia characterized by gross oral weakness and trace silent aspiration of thin liquids and NTL; swallowing function is negatively impacted by fluctuating alertness. Note anterior spillage with all liquids presented and prolonged AP transit and delayed swallowing trigger, triggered at the level of the valleculae. With thin liquids and NTL, note trace silent aspiration before and during the swallow; No reflexive cough and cued cough is VERY weak and not effective in clearing aspirates. With HTL note one episode of deep flash penetration to the cords. Pt consumed Puree textures (requires feeder and cues to open mouth wide enough to insert spoon for bolus), with prolonged oral prep, lingual pumping and a delayed swallowing trigger at the level of the vallecuale; laryngeal vestibule closure is good with solids and pharyngeal squeeze is adequate with trace residuals after the swallow. Note trials of each texture/consistency were very limited to 1-3 presentations of each secondary to fatigue. Recommend alternative means of nutrition and initiate dysphagia therapy and trials of D1/puree and tsp HTL with SLP only. Recommend repeat MBS when clinically appropriate. ST will continue to follow   Swallow Evaluation Recommendations       SLP Diet Recommendations: NPO;Alternative means - long-term;Ice chips PRN after oral care   Liquid Administration via: Spoon   Medication Administration: Via alternative means       Compensations: Slow rate;Small sips/bites   Postural Changes: Seated upright at 90 degrees   Oral Care  Recommendations: Oral care BID   Other Recommendations: Have oral suction available  Kendra Lee, CCC-SLP Speech Language Pathologist   [name] 10/03/2020,3:46 PM

## 2020-10-03 NOTE — Progress Notes (Signed)
Occupational Therapy Treatment Patient Details Name: Kendra Lee MRN: [id number] DOB: 05/29/1955 Today's Date: 10/03/2020    History of present illness Kendra Lee  is a 65 y.o. female, with no known medical history, who presents to the ED with altered mental status. History is quite limited 2/2 patient's non verbal status. Earlier, family was able to provide some history to the ED provider. They reported that the patient's last known normal was 6/24. She has been somnolent, and not taking care of herself in that interval. Family was not aware of an EtOH or illicit drug use. They are not aware of an trauma, and there are not signs of trauma on exam. Family reported no infectious symptoms or fevers.   OT comments  Pt demonstrated improved postural stability this date. Pt able to maintain upright position at EOB without additional support from this therapist. Pt able to tolerate ~2 minutes of weight bearing on R UE at EOB and was noted to have less than 25% active ROM of shoulder, elbow, and digits. More trace movement of R UE digits. Mild tone noted in R elbow which decreased after x10 reps of P/ROM for elbow extension. Pt required min to mod A for stand pivot transfer to chair from EOB, which is an improvement from previous attempts. Pt will continue to benefit from acute OT services to address deficit areas.   Follow Up Recommendations  SNF    Equipment Recommendations  None recommended by OT          Precautions / Restrictions Precautions Precautions: Fall Restrictions Weight Bearing Restrictions: No       Mobility Bed Mobility Overal bed mobility: Needs Assistance Bed Mobility: Supine to Sit     Supine to sit: Min assist          Transfers Overall transfer level: Needs assistance Equipment used: Rolling walker (2 wheeled) Transfers: Sit to/from [employer] Sit to Stand: Min assist;Mod assist Stand pivot transfers: Min assist;Mod assist        General transfer comment: Slow labored movement but imporved participation and strength from pt. Less assist needed today.    Balance Overall balance assessment: Needs assistance Sitting-balance support: Feet supported;No upper extremity supported Sitting balance-Leahy Scale: Fair Sitting balance - Comments: fair/good seated at EOB   Standing balance support: During functional activity;Bilateral upper extremity supported Standing balance-Leahy Scale: Poor Standing balance comment: fair/poor using RW                           ADL either performed or assessed with clinical judgement   ADL Overall ADL's : Needs assistance/impaired     Grooming: Wash/dry face;Min guard;Sitting;Set up Grooming Details (indicate cue type and reason): Pt able to use L UE to wash face with improved attempts and thoroughness. Used A/AROM by placing washcloth on R UE and wiping very briefly on her face.         Upper Body Dressing : Minimal assistance;Sitting Upper Body Dressing Details (indicate cue type and reason): Min A to don gown seated in chair. Coaching on how to dress starting with affeted R UE.     Toilet Transfer: Minimal assistance;Moderate assistance;Stand-pivot Toilet Transfer Details (indicate cue type and reason): simulated via EOB to chair using RW                                   Cognition  Arousal/Alertness: Awake/alert Behavior During Therapy: WFL for tasks assessed/performed Overall Cognitive Status: Within Functional Limits for tasks assessed                                 General Comments: Pt able to verbalize softly once this date. Often nodding head to communicate.        Exercises Exercises: Other exercises;General Upper Extremity General Exercises - Upper Extremity Elbow Extension: 10 reps;Right;Seated;PROM Other Exercises Other Exercises: Completed single reps of P/ROM to assess R UE. Other Exercises: Lateral propping to R side  1 attempt prior to pt reporting pain. Pt weight bearing on R UE for ~2 minutes with this therapist blocking elbow to increase weight bearing through R UE.                Pertinent Vitals/ Pain       Pain Assessment: Faces Faces Pain Scale: Hurts little more Pain Location: R abdominal area during lateral propping to R side. Pain Descriptors / Indicators: Grimacing Pain Intervention(s): Limited activity within patient's tolerance;Monitored during session;Repositioned                                                          Frequency  Min 2X/week        Progress Toward Goals  OT Goals(current goals can now be found in the care plan section)  Progress towards OT goals: Progressing toward goals  Acute Rehab OT Goals Patient Stated Goal: return home OT Goal Formulation: Patient unable to participate in goal setting Time For Goal Achievement: 10/03/20 Potential to Achieve Goals: Good ADL Goals Pt Will Perform Grooming: with supervision;sitting;standing Pt Will Perform Upper Body Dressing: with min assist;sitting;standing Pt Will Transfer to Toilet: with mod assist;stand pivot transfer;ambulating;regular height toilet;bedside commode Pt Will Perform Toileting - Clothing Manipulation and hygiene: with min assist;sitting/lateral leans;sit to/from stand Pt/caregiver will Perform Home Exercise Program: Increased ROM;Increased strength;Both right and left upper extremity;With Supervision;With written HEP provided  Plan Discharge plan remains appropriate                                    End of Session Equipment Utilized During Treatment: Rolling walker  OT Visit Diagnosis: Muscle weakness (generalized) (M62.81)   Activity Tolerance Patient tolerated treatment well   Patient Left in chair;with call bell/phone within reach;with chair alarm set             Time: [phone] OT Time Calculation (min): 29 min  Charges: OT General  Charges $OT Visit: 1 Visit OT Treatments $Self Care/Home Management : 23-37 mins  Aquanetta Schwarz OT, MOT    Danie Chandler 10/03/2020, 12:42 PM

## 2020-10-03 NOTE — Progress Notes (Signed)
PROGRESS NOTE  Kendra Lee NAT:[id number] DOB: 03/22/1956 DOA: 09/17/2020 PCP: Pcp, No  Brief History:  65 y.o. female, with no known medical history, who presents to the ED with altered mental status. History is quite limited 2/2 patient's acute encephalopathy. Earlier, family was able to provide some history to the ED provider. They reported that the patient's last known normal was 6/24. She has been somnolent, and not taking care of herself in that interval. Family was not aware of an EtOH or illicit drug use. They are not aware of an trauma, and there are not signs of trauma on exam. Family reported no infectious symptoms or fevers.  Work up revealed patient suffered bilateral thalamic infarcts that have contributed to her encephalopathy.  After a goals of care discussion, patient's son wanted full scope of care.  Patient failed swallow eval and PEG tube was placed 09/22/20 and enteral feeding was started and titrated to goal rate without difficulty.  ED: Vitals stable, encephalopathic, CT head shows focal regions of hypoattenuation seen in the genu of the right internal capsule, posterior limb of the left internal capsule and right thalamus which could reflect age-indeterminate infarction.  No other acute abnormality   Assessment/Plan: Devastating Acute ischemic stroke/Acute metabolic Encephalopathy -Dr. Arbutus Leas discussed with neurology, Dr. Eligah East most likely due to stroke -encephalopathy seems to be resolved at this time.  -MR brain--scattered bilateral infarcts of thalamus -carotid US--R-ICA--50-69%; L-ICA <50% -MRA brain--no LVO -Echo (TTE)--EF 60-65%, no WMA, no PFO -A1C--5.5 -LDL--138 -continue ASA and plavix -discussed with cardiology--not a good candidate for TEE nor loop recorder -TSH--1.927 -ammonia 15>>26 -B12--262 -folate 4.8 -UA--recheck ordered 7/10--->negative   Hyperlipidemia -continue statin when able to take p.o. or have PEG re-placed     Severe Dysphagia with high aspiration risk -speech eval-->not safe for po intake -IR placed PEG 09/22/20 -started enteral feeding 7/2 -tolerated Osmolite 1.2 @ 40 cc/hr -Free Water 200 cc every 8 hours -PT REMOVED PEG ON 7/8 -IR planning to replace early next week -IV dextrose infusion ordered for free water and glucose - Update: SLP planning to reassess today and update if swallow function has improved  Leukocytosis - CXR, urinalysis no acute findings or signs of infection  Low grade temperature - ordered blood cultures x 2 on 7/12.    Hypertension - uncontrolled -allowed permissive hypertension initially -since PEG was removed by patient we cannot give oral BP meds -I have ordered IV BP meds until patient can have a new PEG placed. -added clonidine patch 7/10 -increased hydralazine to 15 mg every 4 hours, increased metoprolol to 7 mg every 6 hours. - increased clonidine patch dose to 0.2 mg on 7/12 while she is still NPO and has no PEG tube in place     Hypokalemia -IV replacement ordered 7/10, additional IV replacement 7/11, 7/12 -recheck BMP in AM.    Tobacco abuse -she was smoking up to 2 ppd prior to admssion -cessation discussed   Generalized weakness and debilitation  - B12 is WNL - check 25-OH vitamin D --->12.29.  ADD VIT D SUPP WHEN PEG IN PLACE  Severe Vitamin D deficiency  - Add Vitamin D supplementation when able.   Status is: Inpatient  Remains inpatient appropriate because:Persistent severe electrolyte disturbances  Dispo: The patient is from: Home              Anticipated d/c is to: SNF  Patient currently is medically stable to d/c.   Difficult to place patient No  Family Communication:  no family present for last several days during rounds  Consultants:  neurology  Code Status:  FULL  DVT Prophylaxis:  [place] Heparin   Procedures: As Listed in Progress Note Above  Antibiotics: None  Subjective: Pt had no specific complaints  today.    Objective: Vitals:   10/02/20 2059 10/03/20 0008 10/03/20 0300 10/03/20 0530  BP: (!) 166/74 (!) 170/67 (!) 198/87 (!) 172/88  Pulse: 77 80 86 93  Resp: Temp: (!) 97.5 F (36.4 C)   (!) 100.4 F (38 C)  TempSrc: Oral   Oral  SpO2: 94%   94%  Weight:      Height:        Intake/Output Summary (Last 24 hours) at 10/03/2020 1327 Last data filed at 10/03/2020 0900 Gross per 24 hour  Intake 1523.01 ml  Output 750 ml  Net 773.01 ml   Weight change:  Exam:  General:  very thin, emaciated chronically ill appearing, somnolent but arousable, severe dental caries.   HEENT: No icterus, No thrush, No neck mass, [place]/AT Cardiovascular: distant S1/S2, no rubs, no gallops Respiratory: bibasilar rales. No wheeze Abdomen: soft, no masses palpated, G tube site covered in bandage. Extremities: No edema, No lymphangitis, No petechiae, No rashes, no synovitis  Data Reviewed: I have personally reviewed following labs and imaging studies Basic Metabolic Panel: Recent Labs  Lab 10/01/20 0425 10/02/20 0620 10/03/20 0330  NA 140 145 145  K 2.9* 3.0* 2.9*  CL 107 112* 113*  CO2 GLUCOSE 112* 119* 109*  BUN 36* 24* 18  CREATININE 0.61 0.59 0.61  CALCIUM 8.6* 8.7* 8.5*  MG 2.4 2.3  --    Liver Function Tests: No results for input(s): AST, ALT, ALKPHOS, BILITOT, PROT, ALBUMIN in the last 168 hours. No results for input(s): LIPASE, AMYLASE in the last 168 hours. No results for input(s): AMMONIA in the last 168 hours.  Coagulation Profile: No results for input(s): INR, PROTIME in the last 168 hours.  CBC: Recent Labs  Lab 10/01/20 0425 10/02/20 0620  WBC 13.1* 9.8  NEUTROABS 11.6* 8.4*  HGB 11.4* 11.8*  HCT 34.5* 35.2*  MCV 95.3 94.4  PLT 360 395   Cardiac Enzymes: No results for input(s): CKTOTAL, CKMB, CKMBINDEX, TROPONINI in the last 168 hours. BNP: Invalid input(s): POCBNP CBG: No results for input(s): GLUCAP in the last 168 hours. HbA1C: No  results for input(s): HGBA1C in the last 72 hours. Urine analysis:    Component Value Date/Time   COLORURINE YELLOW 10/01/2020 1037   APPEARANCEUR CLEAR 10/01/2020 1037   LABSPEC 1.016 10/01/2020 1037   PHURINE 5.0 10/01/2020 1037   GLUCOSEU NEGATIVE 10/01/2020 1037   HGBUR NEGATIVE 10/01/2020 1037   BILIRUBINUR NEGATIVE 10/01/2020 1037   KETONESUR NEGATIVE 10/01/2020 1037   PROTEINUR NEGATIVE 10/01/2020 1037   NITRITE NEGATIVE 10/01/2020 1037   LEUKOCYTESUR NEGATIVE 10/01/2020 1037   No results found for this or any previous visit (from the past 240 hour(s)).   Scheduled Meds:  amLODipine  5 mg Per Tube Daily   aspirin  81 mg Per Tube Daily   atorvastatin  80 mg Per Tube Daily   cloNIDine  0.1 mg Transdermal Weekly   clopidogrel  75 mg Per Tube Daily   folic acid  1 mg Intravenous Daily   free water  200 mL Per Tube Q8H  heparin  5,000 Units Subcutaneous Q8H   hydrALAZINE  15 mg Intravenous Q4H   hydrALAZINE  10 mg Oral Q8H   losartan  25 mg Per Tube Daily   mouth rinse  15 mL Mouth Rinse BID   metoprolol tartrate  7 mg Intravenous Q6H   pneumococcal 23 valent vaccine  0.5 mL Intramuscular Tomorrow-1000   scopolamine  1 patch Transdermal Q72H   thiamine  100 mg Per Tube Daily   Continuous Infusions:  dextrose 5 % and 0.9% NaCl 1,000 mL (10/03/20 0932)   feeding supplement (OSMOLITE 1.2 CAL)     potassium chloride 10 mEq (10/03/20 1253)    Procedures/Studies: CT ABDOMEN WO CONTRAST  Result Date: 09/21/2020 CLINICAL DATA:  Evaluate anatomy prior to potential percutaneous gastrostomy tube placement. EXAM: CT ABDOMEN WITHOUT CONTRAST TECHNIQUE: Multidetector CT imaging of the abdomen was performed following the standard protocol without IV contrast. COMPARISON:  None. FINDINGS: The lack of intravenous contrast limits the ability to evaluate solid abdominal organs. Lower chest: Limited visualization of the lower thorax demonstrates minimal dependent subpleural atelectasis.  Normal heart size. No pericardial effusion. Hepatobiliary: Normal hepatic contour. Normal noncontrast appearance of the gallbladder given degree distention. No radiopaque gallstones. There is a trace amount of perihepatic ascites. Pancreas: Normal noncontrast appearance of the pancreas. Spleen: Normal noncontrast appearance of the spleen. 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 Normal noncontrast appearance of the right kidney. No renal stones. No right-sided urinary obstruction. Normal noncontrast appearance the bilateral adrenal glands. The urinary bladder was not imaged. Stomach/Bowel: The anterior wall the stomach is well apposed against the ventral wall of the abdomen without interposition of the hepatic parenchyma or the transverse colon. Moderate colonic stool burden without evidence of enteric obstruction. No pneumoperitoneum, pneumatosis or portal venous gas. Vascular/Lymphatic: Large amount of irregular calcified atherosclerotic plaque within a bilobed ectatic abdominal aorta with dominant caudal component measuring 2.6 cm and maximal oblique short axis coronal diameter (coronal image 33, series 5). No bulky retroperitoneal or mesenteric lymphadenopathy. Other: Regional soft tissues appear normal. Musculoskeletal: No acute or aggressive osseous abnormalities. Mild DDD of L4-L5 with disc space height loss, endplate irregularity and sclerosis. IMPRESSION: 1. Gastric anatomy amenable to potential percutaneous gastrostomy tube placement as indicated. 2. 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 3. Mildly ectatic abdominal aorta measuring 2.6 cm in maximal diameter. Recommend follow-up aortic ultrasound in 5 years. This recommendation follows ACR consensus guidelines: White Paper of the ACR Incidental Findings Committee II on Vascular Findings. J Am  Coll Radiol 2013; 10:789-794. 4. Aortic aneurysm NOS (ICD10-I71.9). Electronically Signed   By: Simonne Come M.D.   On: 09/21/2020 09:36   CT Head Wo Contrast  Result Date: 09/17/2020 CLINICAL DATA:  Mental status change, found this afternoon on couch EXAM: CT HEAD WITHOUT CONTRAST TECHNIQUE: Contiguous axial images were obtained from the base of the skull through the vertex without intravenous contrast. COMPARISON:  None. FINDINGS: Brain: Foci of hypoattenuation present in the right internal capsule as as a separate focus in the right caudate and posterior limb of the left internal capsule could reflect sequela of age-indeterminate lacunar type infarcts difficult to fully assess given a background of more diffuse patchy white matter hypoattenuation typically indicative of microvascular angiopathy in a patient of this age. Additional hypoattenuation in the pons and brainstem is more nonspecific given streak artifact across skull base. Symmetric prominence of the ventricles, cisterns and sulci  compatible with parenchymal volume loss. No hyperdense hemorrhage. No mass effect or midline shift. No extra-axial collection. Scattered benign dural calcifications. Vascular: Atherosclerotic calcification of the carotid siphons. No hyperdense vessel. Skull: No calvarial fracture or suspicious osseous lesion. No scalp swelling or hematoma. Sinuses/Orbits: Paranasal sinuses and mastoid air cells are predominantly clear. Other: Included orbital structures are unremarkable. 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 Could be further characterized with MR imaging as warranted. No other acute intracranial abnormality. These results were called by telephone at  the time of interpretation on 09/17/2020 at 11:45 pm to provider Benjiman CoreNATHAN PICKERING , who verbally acknowledged these results. Electronically Signed   By: Kreg ShropshirePrice  DeHay M.D.   On: 09/17/2020 23:45   MR ANGIO HEAD WO CONTRAST  Result Date: 09/18/2020 CLINICAL DATA:  Altered mental status. EXAM: MRI HEAD WITHOUT CONTRAST MRA HEAD WITHOUT CONTRAST TECHNIQUE: Multiplanar, multi-echo pulse sequences of the brain and surrounding structures were acquired without intravenous contrast. Angiographic images of the Circle of Willis were acquired using MRA technique without intravenous contrast. COMPARISON:  No pertinent prior exam. FINDINGS: MRI HEAD FINDINGS Brain: Restricted diffusion in the bilateral thalamus, bilateral internal capsule, left centrum semiovale, and right subcortical frontal. These have the appearance of acute infarcts. Mild small vessel ischemic type change in the pons and hemispheric white matter. No evidence of hemorrhage, hydrocephalus, or collection. Vascular: Preserved flow voids. Skull and upper cervical spine: Normal marrow signal Sinuses/Orbits: Negative Other: Significant and progressive motion artifact MRA HEAD FINDINGS Very motion degraded. Carotid, vertebral, and basilar arteries and major branches are patent with gross symmetry. IMPRESSION: Brain MRI: 1. 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 These are not in a unified arterial or venous distribution and are of uncertain cause. No revealing background chronic brain findings. 2. Significantly motion degraded Intracranial MRA: Very motion degraded and of limited utility other than documenting patency of major vessels. Electronically Signed   By: Marnee SpringJonathon  Watts M.D.   On: 09/18/2020 08:43   MR BRAIN WO CONTRAST  Result Date: 09/18/2020 CLINICAL DATA:  Altered mental status. EXAM: MRI HEAD WITHOUT CONTRAST MRA HEAD WITHOUT CONTRAST TECHNIQUE: Multiplanar,  multi-echo pulse sequences of the brain and surrounding structures were acquired without intravenous contrast. Angiographic images of the Circle of Willis were acquired using MRA technique without intravenous contrast. COMPARISON:  No pertinent prior exam. FINDINGS: MRI HEAD FINDINGS Brain: Restricted diffusion in the bilateral thalamus, bilateral internal capsule, left centrum semiovale, and right subcortical frontal. These have the appearance of acute infarcts. Mild small vessel ischemic type change in the pons and hemispheric white matter. No evidence of hemorrhage, hydrocephalus, or collection. Vascular: Preserved flow voids. Skull and upper cervical spine: Normal marrow signal Sinuses/Orbits: Negative Other: Significant and progressive motion artifact MRA HEAD FINDINGS Very motion degraded. Carotid, vertebral, and basilar arteries and major branches are patent with gross symmetry. IMPRESSION: Brain MRI: 1. 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 These are not in a unified arterial or venous distribution and are of uncertain cause. No revealing background chronic brain findings. 2. Significantly motion degraded Intracranial MRA: Very motion degraded and of limited utility other than documenting patency of major vessels. Electronically Signed  By: Marnee Spring M.D.   On: 09/18/2020 08:43   IR GASTROSTOMY TUBE MOD SED  Result Date: 09/22/2020 INDICATION: 65 year old with altered mental status and dysphagia. Request for gastrostomy tube placement. EXAM: PERCUTANEOUS GASTROSTOMY TUBE WITH FLUOROSCOPIC GUIDANCE Physician: Rachelle Hora. Lowella Dandy, MD MEDICATIONS: Ancef 2 g; Antibiotics were administered within 1 hour of the procedure. Glucagon 0.5 mg ANESTHESIA/SEDATION: Versed 2.0 mg IV; Fentanyl 100 mcg IV Moderate Sedation Time:  42 minutes The patient was continuously monitored during the procedure by the interventional radiology  nurse under my direct supervision. FLUOROSCOPY TIME:  Fluoroscopy Time: 12 minutes, 6 seconds, 19 mGy CONTRAST:  20 mL Omnipaque 300 COMPLICATIONS: None immediate. PROCEDURE: Informed consent was obtained for a percutaneous gastrostomy tube. The patient was placed on the interventional table. An orogastric tube was placed with fluoroscopic guidance. The anterior abdomen was prepped and draped in sterile fashion. Maximal barrier sterile technique was utilized including caps, mask, sterile gowns, sterile gloves, sterile drape, hand hygiene and skin antiseptic. Stomach was inflated with air through the orogastric tube. The skin and subcutaneous tissues were anesthetized with 1% lidocaine. A 17 gauge needle was directed into the distended stomach with fluoroscopic guidance. A wire was advanced into the stomach and a T fastener was deployed. A 9-French vascular sheath was placed and the orogastric tube was snared using a Gooseneck snare device. At this time, the patient was clenching her teeth and we could not open her mouth in order to place the pull-through gastrostomy tube. As a result, we converted to a push through gastrostomy tube. Skin around the 9 French drain was anesthetized with 1% lidocaine. Using fluoroscopic guidance, 2 additional T-fasteners were deployed. The T fastener associated with the 9 French sheath was cut. The 9 French sheath was removed and dilated up to an 18 [place] peel-away sheath. A 16 French Entuit gastrostomy tube was advanced over the wire and through the peel-away sheath. The balloon was inflated with approximately 8 mL of saline. Peel-away sheath and wire were removed. Contrast injection confirmed placement in the stomach. Fluoroscopic images were obtained for documentation. The gastrostomy tube was flushed with normal saline. FINDINGS: 5 French balloon retention gastrostomy tube is positioned in the stomach. Total of 3 T-fasteners were deployed and there are still 2 T-fasteners intact.  Contrast injection confirmed placement in the stomach. IMPRESSION: 1. Successful placement of a percutaneous gastrostomy tube using fluoroscopy. This is a balloon retention gastrostomy tube. 2. There are 2 remaining T-fasteners in place. Plan to cut the T-fasteners in 7-10 days. Electronically Signed   By: Richarda Overlie M.D.   On: 09/22/2020 12:07   US Carotid Bilateral (at [facility] and [facility] only)  Result Date: 09/18/2020 CLINICAL DATA:  Altered mental status Hypertension Stroke EXAM: BILATERAL CAROTID DUPLEX ULTRASOUND TECHNIQUE: Wallace Cullens scale imaging, color Doppler and duplex ultrasound were performed of bilateral carotid and vertebral arteries in the neck. COMPARISON:  None. FINDINGS: Criteria: Quantification of carotid stenosis is based on velocity parameters that correlate the residual internal carotid diameter with NASCET-based stenosis levels, using the diameter of the distal internal carotid lumen as the denominator for stenosis measurement. The following velocity measurements were obtained: RIGHT ICA: 156/29 cm/sec CCA: 77/11 cm/sec SYSTOLIC ICA/CCA RATIO:  2.0 ECA: 173 cm/sec LEFT ICA: 83/18 cm/sec CCA: 85/14 cm/sec SYSTOLIC ICA/CCA RATIO:  1.0 ECA: 92 cm/sec RIGHT CAROTID ARTERY: Heterogeneous calcified and noncalcified plaque of the distal common and proximal internal carotid arteries with velocity indicative of 50-69% stenosis. RIGHT VERTEBRAL ARTERY:  Antegrade flow. LEFT  CAROTID ARTERY: Heterogeneous calcified and noncalcified plaque of the distal common and proximal internal carotid arteries with velocity parameters indicative of less than 50% stenosis. LEFT VERTEBRAL ARTERY:  Antegrade flow. IMPRESSION: 1. 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 2. Less than 50% stenosis of the left internal carotid artery. 3. Long segment shadowing plaque present in both common and internal carotid arteries, which can obscure higher velocities. Further evaluation with MRI or CT of the neck should be performed  for more precise evaluation of degree of stenosis. Electronically Signed   By: Acquanetta Belling M.D.   On: 09/18/2020 10:01   DG CHEST PORT 1 VIEW  Result Date: 10/01/2020 CLINICAL DATA:  Altered mental status, leukocytosis.  Former smoker. EXAM: PORTABLE CHEST 1 VIEW COMPARISON:  Chest x-ray dated 09/17/2020. FINDINGS: Heart size and mediastinal contours are stable. Lungs are hyperexpanded. Coarse lung markings are again seen bilaterally without significant interval change. No new lung findings. No pleural effusion or pneumothorax is seen. Osseous structures about the chest are unremarkable. IMPRESSION: 1. No active disease. No evidence of pneumonia or pulmonary edema. 2. Hyperexpanded lungs indicating COPD. 3. Chronic interstitial lung disease. Electronically Signed   By: Bary Richard M.D.   On: 10/01/2020 09:44   DG Chest Portable 1 View  Result Date: 09/17/2020 CLINICAL DATA:  Altered mental status EXAM: PORTABLE CHEST 1 VIEW COMPARISON:  None FINDINGS: There is hyperinflation of the lungs compatible with COPD. Heart and mediastinal contours are within normal limits. No focal opacities or effusions. No acute bony abnormality. IMPRESSION: COPD.  No active disease. Electronically Signed   By: Charlett Nose M.D.   On: 09/17/2020 23:02   ECHOCARDIOGRAM COMPLETE  Result Date: 09/18/2020    ECHOCARDIOGRAM REPORT   Patient Name:   [facility] Date of Exam: 09/18/2020 Medical Rec #:  [id number]       Height: Accession #:    [id number]      Weight: Date of Birth:  [birth year]       BSA: Patient Age:    64 years        BP:           213/100 mmHg Patient Gender: F               HR:           59 bpm. [facility] Procedure: 2D Echo, Cardiac Doppler and Color Doppler Indications:    Stroke  History:        Patient has no prior history of Echocardiogram examinations.                 Stroke; Risk Factors:Hypertension. Height and Weight not                 available.  Sonographer:    Mikki Harbor  Referring Phys: [id number] ASIA B ZIERLE-GHOSH  Sonographer Comments: Height and Weight not available IMPRESSIONS  1. Left ventricular ejection fraction, by estimation, is 60 to 65%. The left ventricle has normal function. The left ventricle has no regional wall motion abnormalities. There is mild left ventricular hypertrophy. Left ventricular diastolic parameters were normal.  2. Right ventricular systolic function is normal. The right ventricular size is normal. Tricuspid regurgitation signal is inadequate for assessing PA pressure.  3. The mitral valve is grossly normal. Trivial mitral valve regurgitation.  4. The aortic valve is tricuspid. Aortic valve regurgitation is not visualized. Mild aortic valve sclerosis is present, with no evidence of aortic  valve stenosis. Aortic valve mean gradient measures 3.0 mmHg.  5. The inferior vena cava is normal in size with greater than 50% respiratory variability, suggesting right atrial pressure of 3 mmHg. FINDINGS  Left Ventricle: Left ventricular ejection fraction, by estimation, is 60 to 65%. The left ventricle has normal function. The left ventricle has no regional wall motion abnormalities. The left ventricular internal cavity size was normal in size. There is  mild left ventricular hypertrophy. Left ventricular diastolic parameters were normal. Right Ventricle: The right ventricular size is normal. No increase in right ventricular wall thickness. Right ventricular systolic function is normal. Tricuspid regurgitation signal is inadequate for assessing PA pressure. Left Atrium: Left atrial size was normal in size. Right Atrium: Right atrial size was normal in size. Pericardium: There is no evidence of pericardial effusion. Presence of pericardial fat pad. Mitral Valve: The mitral valve is grossly normal. Mild mitral annular calcification. Trivial mitral valve regurgitation. MV peak gradient, 2.7 mmHg. The mean mitral valve gradient is 1.0 mmHg. Tricuspid Valve: The  tricuspid valve is grossly normal. Tricuspid valve regurgitation is trivial. Aortic Valve: The aortic valve is tricuspid. There is mild aortic valve annular calcification. Aortic valve regurgitation is not visualized. Mild aortic valve sclerosis is present, with no evidence of aortic valve stenosis. Aortic valve mean gradient measures 3.0 mmHg. Aortic valve peak gradient measures 5.7 mmHg. Aortic valve area, by VTI measures 2.71 cm. Pulmonic Valve: The pulmonic valve was grossly normal. Pulmonic valve regurgitation is trivial. Aorta: The aortic root is normal in size and structure. Venous: The inferior vena cava is normal in size with greater than 50% respiratory variability, suggesting right atrial pressure of 3 mmHg. IAS/Shunts: The interatrial septum appears to be lipomatous. No atrial level shunt detected by color flow Doppler.  LEFT VENTRICLE PLAX 2D LVIDd:         4.38 cm  Diastology LVIDs:         2.91 cm  LV e' medial:    7.10 cm/s LV PW:         1.04 cm  LV E/e' medial:  11.9 LV IVS:        1.06 cm  LV e' lateral:   7.62 cm/s LVOT diam:     2.00 cm  LV E/e' lateral: 11.0 LV SV:         90 LVOT Area:     3.14 cm  RIGHT VENTRICLE RV Basal diam:  3.37 cm RV Mid diam:    3.06 cm RV S prime:     10.20 cm/s TAPSE (M-mode): 2.3 cm LEFT ATRIUM             RIGHT ATRIUM LA diam:        3.00 cm RA Area:     13.30 cm LA Vol (A2C):   49.5 ml RA Volume:   31.30 ml LA Vol (A4C):   28.7 ml LA Biplane Vol: 40.5 ml  AORTIC VALVE AV Area (Vmax):    2.85 cm AV Area (Vmean):   2.55 cm AV Area (VTI):     2.71 cm AV Vmax:           119.00 cm/s AV Vmean:          86.100 cm/s AV VTI:            0.330 m AV Peak Grad:      5.7 mmHg AV Mean Grad:      3.0 mmHg LVOT Vmax:  108.00 cm/s LVOT Vmean:        69.800 cm/s LVOT VTI:          0.285 m LVOT/AV VTI ratio: 0.86  AORTA Ao Root diam: 3.30 cm MITRAL VALVE MV Area (PHT): 3.15 cm    SHUNTS MV Area VTI:   2.54 cm    Systemic VTI:  0.29 m MV Peak grad:  2.7 mmHg    Systemic  Diam: 2.00 cm MV Mean grad:  1.0 mmHg MV Vmax:       0.81 m/s MV Vmean:      41.2 cm/s MV Decel Time: 241 msec MV E velocity: 84.20 cm/s MV A velocity: 72.70 cm/s MV E/A ratio:  1.16 Nona Dell MD Electronically signed by Nona Dell MD Signature Date/Time: 09/18/2020/2:03:27 PM    Final     Standley Dakins, MD How to contact the [facility] Attending or Consulting provider 7A - 7P or covering provider during after hours 7P -7A, for this patient?  Check the care team in [facility] and look for a) attending/consulting [facility] provider listed and b) the [facility] team listed Log into [email or web address] and use [place]'s universal password to access. If you do not have the password, please contact the hospital operator. Locate the [facility] provider you are looking for under [facility] and page to a number that you can be directly reached. If you still have difficulty reaching the provider, please page the [facility] (Director on Call) for the Hospitalists listed on amion for assistance.  10/03/2020, 1:27 PM   LOS: 15 days

## 2020-10-03 NOTE — Progress Notes (Signed)
Daily Progress Note   Patient Name: Kendra Lee       Date: 10/03/2020 DOB: 24-Jul-1955  Age: 65 y.o. MRN#: [id number] Attending Physician: Cleora Fleet, MD Primary Care Physician: Pcp, No Admit Date: 09/17/2020  Reason for Consultation/Follow-up: Other- feeding tube discussion  Subjective: Kendra Lee is awake and alert, lying in bed. Complains of pain around her old peg tube site.  We discussed PEG tube.  Kendra Lee says that she doesn't want a PEG tube- when asked why she states that she wants to eat.  Discussed with her concerns that she is unable to eat enough to sustain herself and also at risk of aspiration and developing pnuemonia. She was adamant that if food was brought to her that she could eat enough to sustain herself.  I discussed with her the "what if" it is attempted and tested and it was determined that she was not able to eat enough to sustain herself, or that she was at high risk of aspirating. Discussed option of comfort care and the fact that she would likely be at end of life vs PEG tube. She stated she would like to do barium swallow and make attempts at eating. If barium swallow shows significant aspiration and if she proves not to be able to take in enough nutrition to sustain her- the she would wish for PEG tube over comfort care.   Review of Systems  Gastrointestinal:  Positive for abdominal pain.   Length of Stay: 15  Current Medications: Scheduled Meds:   amLODipine  5 mg Per Tube Daily   aspirin  150 mg Rectal Daily   atorvastatin  80 mg Per Tube Daily   [START ON 10/08/2020] cloNIDine  0.2 mg Transdermal Weekly   clopidogrel  75 mg Per Tube Daily   folic acid  1 mg Intravenous Daily   free water  200 mL Per Tube Q8H   heparin  5,000 Units Subcutaneous Q8H    hydrALAZINE  15 mg Intravenous Q4H   hydrALAZINE  10 mg Oral Q8H   losartan  25 mg Per Tube Daily   mouth rinse  15 mL Mouth Rinse BID   metoprolol tartrate  7 mg Intravenous Q6H   pneumococcal 23 valent vaccine  0.5 mL Intramuscular Tomorrow-1000   scopolamine  1 patch Transdermal Q72H  thiamine  100 mg Per Tube Daily    Continuous Infusions:  dextrose 5 % and 0.9% NaCl 1,000 mL (10/03/20 0932)   feeding supplement (OSMOLITE 1.2 CAL)     potassium chloride 10 mEq (10/03/20 1253)    PRN Meds: acetaminophen **OR** acetaminophen (TYLENOL) oral liquid 160 mg/5 mL **OR** acetaminophen, fentaNYL (SUBLIMAZE) injection, hydrALAZINE, ketorolac, simethicone  Physical Exam Vitals and nursing note reviewed.  HENT:     Mouth/Throat:     Comments: Very poor dentition Pulmonary:     Effort: Pulmonary effort is normal.  Abdominal:     General: Abdomen is flat.     Palpations: Abdomen is soft.     Comments: Previous PEG site dry, intact, no erythema, clean dressing in place  Neurological:     Mental Status: She is alert and oriented to person, place, and time.            Vital Signs: BP (!) 172/88 (BP Location: Left Arm)   Pulse 93   Temp (!) 100.4 F (38 C) (Oral)   Resp 17   Ht 5\' 1"  (1.549 m)   Wt 42.1 kg   LMP  (LMP Unknown)   SpO2 94%   BMI 17.54 kg/m  SpO2: SpO2: 94 % O2 Device: O2 Device: Room Air O2 Flow Rate: O2 Flow Rate (L/min): 2 L/min  Intake/output summary:  Intake/Output Summary (Last 24 hours) at 10/03/2020 1357 Last data filed at 10/03/2020 0900 Gross per 24 hour  Intake 1523.01 ml  Output 750 ml  Net 773.01 ml   LBM: Last BM Date: 10/02/20 Baseline Weight: Weight: 42.1 kg Most recent weight: Weight: 42.1 kg       Palliative Assessment/Data: PPS: 10%    Flowsheet Rows    Flowsheet Row Most Recent Value  Intake Tab   Referral Department Hospitalist  Unit at Time of Referral Cardiac/Telemetry Unit  Palliative Care Primary Diagnosis Neurology   Date Notified 09/19/20  Palliative Care Type New Palliative care  Reason for referral Clarify Goals of Care  Date of Admission 09/17/20  Date first seen by Palliative Care 09/19/20  # of days Palliative referral response time 0 Day(s)  # of days IP prior to Palliative referral 2  Clinical Assessment   Palliative Performance Scale Score 10%  Pain Max last 24 hours Not able to report  Pain Min Last 24 hours Not able to report  Dyspnea Max Last 24 Hours Not able to report  Dyspnea Min Last 24 hours Not able to report  Psychosocial & Spiritual Assessment   Palliative Care Outcomes        Patient Active Problem List   Diagnosis Date Noted   Vitamin D deficiency 10/02/2020   Dental caries 10/02/2020   Dysphagia 10/02/2020   Hypokalemia 10/02/2020   Acute metabolic encephalopathy 09/18/2020   CVA (cerebral vascular accident) (HCC) 09/18/2020   Leukocytosis 09/18/2020   Essential hypertension 09/18/2020    Palliative Care Assessment & Plan   Patient Profile:  65 y.o. female  with past medical history of no known medical history admitted on 09/17/2020 with acute metabolic encephalopathy, CVA acute embolic stroke. Palliative original consult on 6/28 with goals set at full scope and full code. Reconsulted 7/12 to discuss feeding tube due to patient pulled out old tube and was declining new tube.   Assessment/Recommendations/Plan  Proceed with MBS- if fails then patient agreeable to PEG tube  Goals of Care and Additional Recommendations: Limitations on Scope of Treatment: Full Scope Treatment  Code  Status: Full code  Prognosis:  Unable to determine  Discharge Planning: Skilled Nursing Facility for rehab with Palliative care service follow-up  Care plan was discussed with patient and care team.  Thank you for allowing the Palliative Medicine Team to assist in the care of this patient.   Total time: 46 mins Greater than 50%  of this time was spent counseling and  coordinating care related to the above assessment and plan.  Kendra Lee, AGNP-C Palliative Medicine   Please contact Palliative Medicine Team phone at [phone] for questions and concerns.

## 2020-10-04 ENCOUNTER — Inpatient Hospital Stay (HOSPITAL_COMMUNITY): Payer: Medicare Other

## 2020-10-04 ENCOUNTER — Encounter (HOSPITAL_COMMUNITY): Payer: Self-pay | Admitting: Family Medicine

## 2020-10-04 ENCOUNTER — Ambulatory Visit (HOSPITAL_COMMUNITY): Payer: Medicare Other

## 2020-10-04 DIAGNOSIS — Z431 Encounter for attention to gastrostomy: Secondary | ICD-10-CM | POA: Insufficient documentation

## 2020-10-04 HISTORY — PX: IR GASTROSTOMY TUBE MOD SED: IMG625

## 2020-10-04 LAB — BASIC METABOLIC PANEL
Anion gap: 8 (ref 5–15)
BUN: 13 mg/dL (ref 8–23)
CO2: 23 mmol/L (ref 22–32)
Calcium: 8 mg/dL — ABNORMAL LOW (ref 8.9–10.3)
Chloride: 112 mmol/L — ABNORMAL HIGH (ref 98–111)
Creatinine, Ser: 0.59 mg/dL (ref 0.44–1.00)
GFR, Estimated: >60 mL/min (ref >60)
Glucose, Bld: 113 mg/dL — ABNORMAL HIGH (ref 70–99)
Potassium: 2.9 mmol/L — ABNORMAL LOW (ref 3.5–5.1)
Sodium: 143 mmol/L (ref 135–145)

## 2020-10-04 LAB — PROCALCITONIN: Procalcitonin: 2.34 ng/mL

## 2020-10-04 LAB — MAGNESIUM: Magnesium: 1.9 mg/dL (ref 1.7–2.4)

## 2020-10-04 LAB — LACTIC ACID, PLASMA
Lactic Acid, Venous: 0.7 mmol/L (ref 0.5–1.9)
Lactic Acid, Venous: 0.6 mmol/L (ref 0.5–1.9)

## 2020-10-04 LAB — MRSA NEXT GEN BY PCR, NASAL: MRSA by PCR Next Gen: NOT DETECTED

## 2020-10-04 MED ORDER — KCL IN DEXTROSE-NACL 40-5-0.45 MEQ/L-%-% IV SOLN: INTRAVENOUS | Status: DC

## 2020-10-04 MED ORDER — CEFAZOLIN SODIUM-DEXTROSE 2-4 GM/100ML-% IV SOLN
INTRAVENOUS | Status: AC | PRN
  Administered 2020-10-04: 2 g via INTRAVENOUS

## 2020-10-04 MED ORDER — FENTANYL CITRATE (PF) 100 MCG/2ML IJ SOLN
INTRAMUSCULAR | Status: AC | PRN
  Administered 2020-10-04 (×2): 25 ug via INTRAVENOUS

## 2020-10-04 MED ORDER — FENTANYL CITRATE (PF) 100 MCG/2ML IJ SOLN
INTRAMUSCULAR | Status: AC
  Filled 2020-10-04: qty 2

## 2020-10-04 MED ORDER — CEFAZOLIN SODIUM-DEXTROSE 2-4 GM/100ML-% IV SOLN
INTRAVENOUS | Status: AC
  Filled 2020-10-04: qty 100

## 2020-10-04 MED ORDER — POTASSIUM CHLORIDE 10 MEQ/100ML IV SOLN
10.0000 meq | INTRAVENOUS | Status: AC
  Administered 2020-10-04 (×5): 10 meq via INTRAVENOUS
  Filled 2020-10-04 (×5): qty 100

## 2020-10-04 MED ORDER — VANCOMYCIN HCL 750 MG/150ML IV SOLN: 750.0000 mg | INTRAVENOUS | Status: DC

## 2020-10-04 MED ORDER — MIDAZOLAM HCL 2 MG/2ML IJ SOLN
INTRAMUSCULAR | Status: AC | PRN
  Administered 2020-10-04: 0.5 mg via INTRAVENOUS
  Administered 2020-10-04: 1 mg via INTRAVENOUS

## 2020-10-04 MED ORDER — VANCOMYCIN HCL IN DEXTROSE 1-5 GM/200ML-% IV SOLN
1000.0000 mg | Freq: Once | INTRAVENOUS | Status: AC
  Administered 2020-10-04: 1000 mg via INTRAVENOUS
  Filled 2020-10-04: qty 200

## 2020-10-04 MED ORDER — LIDOCAINE HCL 1 % IJ SOLN
INTRAMUSCULAR | Status: AC
  Administered 2020-10-04: 5 mL via SUBCUTANEOUS
  Filled 2020-10-04: qty 20

## 2020-10-04 MED ORDER — IOHEXOL 300 MG/ML  SOLN
50.0000 mL | Freq: Once | INTRAMUSCULAR | Status: AC | PRN
  Administered 2020-10-04: 20 mL

## 2020-10-04 MED ORDER — SODIUM CHLORIDE 0.9 % IV BOLUS
1000.0000 mL | Freq: Once | INTRAVENOUS | Status: AC
  Administered 2020-10-04: 1000 mL via INTRAVENOUS

## 2020-10-04 MED ORDER — GLUCAGON HCL RDNA (DIAGNOSTIC) 1 MG IJ SOLR
INTRAMUSCULAR | Status: AC
  Filled 2020-10-04: qty 1

## 2020-10-04 MED ORDER — PIPERACILLIN-TAZOBACTAM 3.375 G IVPB
3.3750 g | Freq: Three times a day (TID) | INTRAVENOUS | Status: DC
  Administered 2020-10-04 – 2020-10-09 (×14): 3.375 g via INTRAVENOUS
  Filled 2020-10-04 (×15): qty 50

## 2020-10-04 MED ORDER — MIDAZOLAM HCL 2 MG/2ML IJ SOLN
INTRAMUSCULAR | Status: AC
  Filled 2020-10-04: qty 2

## 2020-10-04 MED ORDER — GLUCAGON HCL (RDNA) 1 MG IJ SOLR
INTRAMUSCULAR | Status: AC | PRN
  Administered 2020-10-04: 1 mg via INTRAVENOUS

## 2020-10-04 NOTE — Progress Notes (Signed)
PROGRESS NOTE    Kendra Lee  XBJ:[id number] DOB: [birth year] DOA: 09/17/2020 PCP: Pcp, No   Brief Narrative:   65 y.o. female, with no known medical history, who presents to the ED with altered mental status. History is quite limited 2/2 patient's acute encephalopathy. Earlier, family was able to provide some history to the ED provider. They reported that the patient's last known normal was 6/24. She has been somnolent, and not taking care of herself in that interval. Family was not aware of an EtOH or illicit drug use. They are not aware of an trauma, and there are not signs of trauma on exam. Family reported no infectious symptoms or fevers.  Work up revealed patient suffered bilateral thalamic infarcts that have contributed to her encephalopathy.  After a goals of care discussion, patient's son wanted full scope of care.  Patient failed swallow eval and PEG tube was placed 09/22/20 and enteral feeding was started and titrated to goal rate without difficulty.  ED: Vitals stable, encephalopathic, CT head shows focal regions of hypoattenuation seen in the genu of the right internal capsule, posterior limb of the left internal capsule and right thalamus which could reflect age-indeterminate infarction.  No other acute abnormality  Assessment & Plan:   Active Problems:   Acute metabolic encephalopathy   CVA (cerebral vascular accident) (HCC)   Leukocytosis   Essential hypertension   Vitamin D deficiency   Dental caries   Dysphagia   Hypokalemia   Devastating Acute ischemic stroke/Acute metabolic Encephalopathy -Dr. Arbutus Leas discussed with neurology, Dr. Eligah East most likely due to stroke -encephalopathy seems to be resolved at this time. -MR brain--scattered bilateral infarcts of thalamus -carotid US--R-ICA--50-69%; L-ICA <50% -MRA brain--no LVO -Echo (TTE)--EF 60-65%, no WMA, no PFO -A1C--5.5 -LDL--138 -continue ASA and plavix -discussed with cardiology--not a good  candidate for TEE nor loop recorder -TSH--1.927 -ammonia 15>>26 -B12--262 -folate 4.8 -UA--recheck ordered 7/10--->negative   Hyperlipidemia -continue statin when able to take p.o. or have PEG re-placed    Severe Dysphagia with high aspiration risk -speech eval-->not safe for po intake -IR placed PEG 09/22/20 -started enteral feeding 7/2 -tolerated Osmolite 1.2 @ 40 cc/hr -Free Water 200 cc every 8 hours -PT REMOVED PEG ON 7/8 -IR has replaced PEG tube 7/13 -IV dextrose infusion ordered for free water and glucose - Update: SLP planning to reassess today and update if swallow function has improved   Hypertension - uncontrolled -allowed permissive hypertension initially -since PEG was removed by patient we cannot give oral BP meds -I have ordered IV BP meds until patient can have a new PEG placed. -added clonidine patch 7/10 -increased hydralazine to 15 mg every 4 hours, increased metoprolol to 7 mg every 6 hours. - increased clonidine patch dose to 0.2 mg on 7/12 while she is still NPO and has no PEG tube in place     Hypokalemia -IV replacement ordered 7/10, additional IV replacement 7/11, 7/12, 7/13 -recheck BMP in AM.    Tobacco abuse -she was smoking up to 2 ppd prior to admssion -cessation discussed   Generalized weakness and debilitation - B12 is WNL - check 25-OH vitamin D --->12.29.  ADD VIT D SUPP WHEN PEG IN PLACE   Severe Vitamin D deficiency - Add Vitamin D supplementation when able.    DVT prophylaxis:Heparin Code Status: Full Family Communication: None at bedside Disposition Plan:  Status is: Inpatient  Remains inpatient appropriate because:IV treatments appropriate due to intensity of illness or inability to take PO and Inpatient  level of care appropriate due to severity of illness  Dispo: The patient is from: Home              Anticipated d/c is to: SNF              Patient currently is not medically stable to d/c.   Difficult to place patient  No   Consultants:  IR  Procedures:  PEG placement 7/13  Antimicrobials:  Anti-infectives (From admission, onward)    Start     Dose/Rate Route Frequency Ordered Stop   10/04/20 1014  ceFAZolin (ANCEF) IVPB 2g/100 mL premix        over 30 Minutes Intravenous Continuous PRN 10/04/20 1019 10/04/20 1014   10/04/20 1011  ceFAZolin (ANCEF) 2-4 GM/100ML-% IVPB       Note to Pharmacy: Cristy Friedlanderusterman, Kyle   : cabinet override      10/04/20 1011 10/04/20 1037   09/22/20 1001  ceFAZolin (ANCEF) 2-4 GM/100ML-% IVPB       Note to Pharmacy: Guido SanderNikolich, Gregory   : cabinet override      09/22/20 1001 09/22/20 2214   09/22/20 0000  ceFAZolin (ANCEF) IVPB 2g/100 mL premix        2 g 200 mL/hr over 30 Minutes Intravenous To Radiology 09/21/20 1256 09/22/20 1040       Subjective: Patient seen and evaluated today with no new acute complaints or concerns.  She has just returned from PEG tube placement.  Objective: Vitals:   10/04/20 1050 10/04/20 1115 10/04/20 1131 10/04/20 1322  BP: (!) 179/90 (!) 168/77 (!) 161/84 (!) 190/88  Pulse: 95 81 86 94  Resp: 16 (!) 24 (!) 25 (!) 24  Temp:    98.9 F (37.2 C)  TempSrc:    Oral  SpO2: 98% 92% 93% 95%  Weight:      Height:        Intake/Output Summary (Last 24 hours) at 10/04/2020 1457 Last data filed at 10/04/2020 0537 Gross per 24 hour  Intake 387.59 ml  Output 500 ml  Net -112.41 ml   Filed Weights   09/18/20 1600  Weight: 42.1 kg    Examination:  General exam: Appears calm and comfortable  Respiratory system: Clear to auscultation. Respiratory effort normal. Cardiovascular system: S1 & S2 heard, RRR.  Gastrointestinal system: Abdomen is soft, PEG tube present, clean dry and intact. Central nervous system: Alert and awake Extremities: No edema Skin: No significant lesions noted Psychiatry: Flat affect.    Data Reviewed: I have personally reviewed following labs and imaging studies  CBC: Recent Labs  Lab 10/01/20 0425  10/02/20 0620  WBC 13.1* 9.8  NEUTROABS 11.6* 8.4*  HGB 11.4* 11.8*  HCT 34.5* 35.2*  MCV 95.3 94.4  PLT 360 395   Basic Metabolic Panel: Recent Labs  Lab 10/01/20 0425 10/02/20 0620 10/03/20 0330 10/04/20 0410  NA 140 145 145 143  K 2.9* 3.0* 2.9* 2.9*  CL 107 112* 113* 112*  CO2 29 26 25 23   GLUCOSE 112* 119* 109* 113*  BUN 36* 24* 18 13  CREATININE 0.61 0.59 0.61 0.59  CALCIUM 8.6* 8.7* 8.5* 8.0*  MG 2.4 2.3  --  1.9   GFR: Estimated Creatinine Clearance: 46 mL/min (by C-G formula based on SCr of 0.59 mg/dL). Liver Function Tests: No results for input(s): AST, ALT, ALKPHOS, BILITOT, PROT, ALBUMIN in the last 168 hours. No results for input(s): LIPASE, AMYLASE in the last 168 hours. No results for input(s): AMMONIA in the  last 168 hours. Coagulation Profile: No results for input(s): INR, PROTIME in the last 168 hours. Cardiac Enzymes: No results for input(s): CKTOTAL, CKMB, CKMBINDEX, TROPONINI in the last 168 hours. BNP (last 3 results) No results for input(s): PROBNP in the last 8760 hours. HbA1C: No results for input(s): HGBA1C in the last 72 hours. CBG: No results for input(s): GLUCAP in the last 168 hours. Lipid Profile: No results for input(s): CHOL, HDL, LDLCALC, TRIG, CHOLHDL, LDLDIRECT in the last 72 hours. Thyroid Function Tests: No results for input(s): TSH, T4TOTAL, FREET4, T3FREE, THYROIDAB in the last 72 hours. Anemia Panel: No results for input(s): VITAMINB12, FOLATE, FERRITIN, TIBC, IRON, RETICCTPCT in the last 72 hours. Sepsis Labs: No results for input(s): PROCALCITON, LATICACIDVEN in the last 168 hours.  Recent Results (from the past 240 hour(s))  Culture, blood (Routine X 2) w Reflex to ID Panel     Status: None (Preliminary result)   Collection Time: 10/03/20  2:09 PM   Specimen: BLOOD RIGHT HAND  Result Value Ref Range Status   Specimen Description   Final    BLOOD RIGHT HAND BOTTLES DRAWN AEROBIC AND ANAEROBIC   Special Requests Blood  Culture adequate volume  Final   Culture   Final    NO GROWTH < 24 HOURS Performed at [facility], [address], [place], [place] [address]    Report Status PENDING  Incomplete  Culture, blood (Routine X 2) w Reflex to ID Panel     Status: None (Preliminary result)   Collection Time: 10/03/20  4:07 PM   Specimen: BLOOD  Result Value Ref Range Status   Specimen Description BLOOD  Final   Special Requests NONE  Final   Culture   Final    NO GROWTH < 24 HOURS Performed at [facility], [address], [place], [place] [address]    Report Status PENDING  Incomplete         Radiology Studies: IR GASTROSTOMY TUBE MOD SED  Result Date: 10/04/2020 INDICATION: Altered mental status and dysphagia. Request made for gastrostomy tube placement for enteric nutrition supplementation purposes Note, patient underwent placement of a push gastrostomy tube on 09/22/2020 by Dr. Lowella Dandy however the tube was inadvertently removed After prolonged conversation with the patient's family regarding goals of care decision was made to proceed with repeat definitive gastrostomy tube placement. EXAM: PULL TROUGH GASTROSTOMY TUBE PLACEMENT COMPARISON:  Image guided placement of a post gastrostomy tube-09/22/2020 CT abdomen and pelvis-09/21/2020 MEDICATIONS: Patient is admitted to the hospital and receiving intravenous antibiotics.; Antibiotics were administered within 1 hour of the procedure. Glucagon 1 mg IV CONTRAST:  20 mL of Omnipaque 300 administered into the gastric lumen. ANESTHESIA/SEDATION: Moderate (conscious) sedation was employed during this procedure. A total of Versed 1.5 mg and Fentanyl 50 mcg was administered intravenously. Moderate Sedation Time: 11 minutes. The patient's level of consciousness and vital signs were monitored continuously by radiology nursing throughout the procedure under my direct supervision. FLUOROSCOPY TIME:  4 minutes, 6 seconds (70 mGy) COMPLICATIONS: None immediate. PROCEDURE:  Informed written consent was obtained from the patient's family following explanation of the procedure, risks, benefits and alternatives. A time out was performed prior to the initiation of the procedure. On physical inspection, it was clear that the prior gastrostomy tube was completely healed and not amenable to attempted percutaneous recanalization. As such the decision was made to proceed with definitive placement of a new now pull-through gastrostomy tube (previously, a push gastrostomy tube was placed secondary to patient's inability  to cooperate with the procedure and open her mouth). Ultrasound scanning was performed to demarcate the edge of the left lobe of the liver. Maximal barrier sterile technique utilized including caps, mask, sterile gowns, sterile gloves, large sterile drape, hand hygiene and Betadine prep. The left upper quadrant was sterilely prepped and draped. An oral gastric catheter was inserted into the stomach under fluoroscopy. The existing nasogastric feeding tube was removed. The left costal margin and air opacified transverse colon were identified and avoided. Air was injected into the stomach for insufflation and visualization under fluoroscopy. Under sterile conditions a 17 gauge trocar needle was utilized to access the stomach percutaneously beneath the left subcostal margin after the overlying soft tissues were anesthetized with 1% Lidocaine with epinephrine. Needle position was confirmed within the stomach with aspiration of air and injection of small amount of contrast. A single T tack was deployed for gastropexy. Over an Amplatz guide wire, a 9-French sheath was inserted into the stomach. A snare device was utilized to capture the oral gastric catheter. The snare device was pulled retrograde from the stomach up the esophagus and out the oropharynx. The 20-French pull-through gastrostomy was connected to the snare device and pulled antegrade through the oropharynx down the esophagus  into the stomach and then through the percutaneous tract external to the patient. The gastrostomy was assembled externally. Contrast injection confirms appropriate positioning within the stomach. Several spot radiographic images were obtained in various obliquities for documentation. Dressings were applied. The patient tolerated procedure well without immediate post procedural complication. FINDINGS: After successful fluoroscopic guided placement, the gastrostomy tube is appropriately positioned with internal disc positioned against the inner ventral wall of the gastric lumen. IMPRESSION: Successful fluoroscopic insertion of a 20-French pull-through gastrostomy tube. The gastrostomy may be used immediately for medication administration and in 24 hrs for the initiation of feeds. Electronically Signed   By: Simonne Come M.D.   On: 10/04/2020 12:17   DG Swallowing Func-Speech Pathology  Result Date: 10/03/2020 Formatting of this result is different from the original. Objective Swallowing Evaluation: Type of Study: MBS-Modified Barium Swallow Study  Patient Details Name: Larena Ohnemus MRN: [id number] Date of Birth: 1955/12/06 Today's Date: 10/03/2020 Time: SLP Start Time (ACUTE ONLY): 1436 -SLP Stop Time (ACUTE ONLY): 1515 SLP Time Calculation (min) (ACUTE ONLY): 39 min Past Medical History: Past Medical History: Diagnosis Date  Smoker   2 ppd x 55 years Past Surgical History: Past Surgical History: Procedure Laterality Date  IR GASTROSTOMY TUBE MOD SED  09/22/2020 HPI: 65 y.o. female  with past medical history of no known medical history admitted on 09/17/2020 with acute metabolic encephalopathy, CVA acute embolic stroke. MRI shows Scattered small vessel infarcts in the bilateral thalamus,   bilateral internal capsule, and bilateral hemispheric white matter. Pt s/p PEG on Friday due to severity of recent stroke with dysphagia.  Subjective: "squash" when asked what she grows in the garden Assessment / Plan /  Recommendation CHL IP CLINICAL IMPRESSIONS 10/03/2020 Clinical Impression Pt presents with moderate/severe sensorimotor oropharyngeal dysphagia characterized by gross oral weakness and trace silent aspiration of thin liquids and NTL; swallowing function is negatively impacted by fluctuating alertness. Note anterior spillage with all liquids presented and prolonged AP transit and delayed swallowing trigger, triggered at the level of the valleculae. With thin liquids and NTL, note trace silent aspiration before and during the swallow; No reflexive cough and cued cough is VERY weak and not effective in clearing aspirates. With HTL note one episode of deep  flash penetration to the cords. Pt consumed Puree textures (requires feeder and cues to open mouth wide enough to insert spoon for bolus), with prolonged oral prep, lingual pumping and a delayed swallowing trigger at the level of the vallecuale; laryngeal vestibule closure is good with solids and pharyngeal squeeze is adequate with trace residuals after the swallow. Note trials of each texture/consistency were very limited to 1-3 presentations of each secondary to fatigue. Recommend alternative means of nutrition and initiate dysphagia therapy and trials of D1/puree and tsp HTL with SLP only. Recommend repeat MBS when clinically appropriate. ST will continue to follow SLP Visit Diagnosis Dysphagia, unspecified (R13.10) Attention and concentration deficit following -- Frontal lobe and executive function deficit following -- Impact on safety and function Severe aspiration risk;Risk for inadequate nutrition/hydration   CHL IP TREATMENT RECOMMENDATION 10/03/2020 Treatment Recommendations Therapy as outlined in treatment plan below   Prognosis 10/03/2020 Prognosis for Safe Diet Advancement Guarded Barriers to Reach Goals Severity of deficits Barriers/Prognosis Comment -- CHL IP DIET RECOMMENDATION 10/03/2020 SLP Diet Recommendations NPO;Alternative means - long-term;Ice chips  PRN after oral care Liquid Administration via Spoon Medication Administration Via alternative means Compensations Slow rate;Small sips/bites Postural Changes Seated upright at 90 degrees   CHL IP OTHER RECOMMENDATIONS 10/03/2020 Recommended Consults -- Oral Care Recommendations Oral care BID Other Recommendations Have oral suction available   CHL IP FOLLOW UP RECOMMENDATIONS 10/03/2020 Follow up Recommendations Skilled Nursing facility   [facility] IP FREQUENCY AND DURATION 10/03/2020 Speech Therapy Frequency (ACUTE ONLY) min 3x week Treatment Duration 2 weeks      CHL IP ORAL PHASE 10/03/2020 Oral Phase Impaired Oral - Pudding Teaspoon -- Oral - Pudding Cup -- Oral - Honey Teaspoon NT Oral - Honey Cup Left anterior bolus loss;Right anterior bolus loss;Impaired mastication;Weak lingual manipulation;Reduced posterior propulsion;Holding of bolus;Lingual/palatal residue;Piecemeal swallowing;Delayed oral transit;Decreased bolus cohesion;Premature spillage Oral - Nectar Teaspoon Left anterior bolus loss;Right anterior bolus loss;Impaired mastication;Weak lingual manipulation;Reduced posterior propulsion;Holding of bolus;Lingual/palatal residue;Piecemeal swallowing;Delayed oral transit;Decreased bolus cohesion;Premature spillage Oral - Nectar Cup Left anterior bolus loss;Right anterior bolus loss;Impaired mastication;Weak lingual manipulation;Reduced posterior propulsion;Holding of bolus;Lingual/palatal residue;Piecemeal swallowing;Delayed oral transit;Decreased bolus cohesion;Premature spillage Oral - Nectar Straw NT Oral - Thin Teaspoon Left anterior bolus loss;Right anterior bolus loss;Impaired mastication;Weak lingual manipulation;Reduced posterior propulsion;Holding of bolus;Lingual/palatal residue;Piecemeal swallowing;Delayed oral transit;Decreased bolus cohesion;Premature spillage Oral - Thin Cup Left anterior bolus loss;Right anterior bolus loss;Impaired mastication;Weak lingual manipulation;Reduced posterior  propulsion;Holding of bolus;Lingual/palatal residue;Piecemeal swallowing;Delayed oral transit;Decreased bolus cohesion;Premature spillage Oral - Thin Straw NT Oral - Puree Weak lingual manipulation;Lingual pumping;Reduced posterior propulsion;Holding of bolus;Lingual/palatal residue;Piecemeal swallowing;Delayed oral transit;Decreased bolus cohesion Oral - Mech Soft NT Oral - Regular NT Oral - Multi-Consistency NT Oral - Pill NT Oral Phase - Comment --  CHL IP PHARYNGEAL PHASE 10/03/2020 Pharyngeal Phase Impaired Pharyngeal- Pudding Teaspoon -- Pharyngeal -- Pharyngeal- Pudding Cup -- Pharyngeal -- Pharyngeal- Honey Teaspoon Reduced airway/laryngeal closure;Penetration/Aspiration during swallow;Pharyngeal residue - valleculae;Pharyngeal residue - pyriform;Reduced epiglottic inversion Pharyngeal -- Pharyngeal- Honey Cup Reduced airway/laryngeal closure;Penetration/Aspiration during swallow;Pharyngeal residue - valleculae;Pharyngeal residue - pyriform;Reduced epiglottic inversion Pharyngeal -- Pharyngeal- Nectar Teaspoon NT Pharyngeal -- Pharyngeal- Nectar Cup Reduced airway/laryngeal closure;Penetration/Aspiration during swallow;Pharyngeal residue - valleculae;Pharyngeal residue - pyriform;Reduced epiglottic inversion;Trace aspiration Pharyngeal Material enters airway, passes BELOW cords without attempt by patient to eject out (silent aspiration) Pharyngeal- Nectar Straw NT Pharyngeal -- Pharyngeal- Thin Teaspoon Reduced airway/laryngeal closure;Penetration/Aspiration during swallow;Pharyngeal residue - valleculae;Pharyngeal residue - pyriform;Reduced epiglottic inversion;Trace aspiration Pharyngeal Material enters airway, passes BELOW cords without attempt by patient to eject  out (silent aspiration) Pharyngeal- Thin Cup Reduced airway/laryngeal closure;Penetration/Aspiration during swallow;Pharyngeal residue - valleculae;Pharyngeal residue - pyriform;Reduced epiglottic inversion Pharyngeal Material enters airway,  passes BELOW cords without attempt by patient to eject out (silent aspiration) Pharyngeal- Thin Straw NT Pharyngeal -- Pharyngeal- Puree -- Pharyngeal -- Pharyngeal- Mechanical Soft -- Pharyngeal -- Pharyngeal- Regular -- Pharyngeal -- Pharyngeal- Multi-consistency -- Pharyngeal -- Pharyngeal- Pill -- Pharyngeal -- Pharyngeal Comment --  CHL IP CERVICAL ESOPHAGEAL PHASE 10/03/2020 Cervical Esophageal Phase WFL Pudding Teaspoon -- Pudding Cup -- Honey Teaspoon -- Honey Cup -- Nectar Teaspoon -- Nectar Cup -- Nectar Straw -- Thin Teaspoon -- Thin Cup -- Thin Straw -- Puree -- Mechanical Soft -- Regular -- Multi-consistency -- Pill -- Cervical Esophageal Comment -- Amelia [name], CCC-SLP Speech Language Pathologist [name] 10/03/2020, 3:51 PM                   Scheduled Meds:  amLODipine  5 mg Per Tube Daily   aspirin  150 mg Rectal Daily   atorvastatin  80 mg Per Tube Daily   [START ON 10/08/2020] cloNIDine  0.2 mg Transdermal Weekly   clopidogrel  75 mg Per Tube Daily   fentaNYL       folic acid  1 mg Intravenous Daily   free water  200 mL Per Tube Q8H   glucagon (human recombinant)       glucagon (human recombinant)       heparin  5,000 Units Subcutaneous Q8H   hydrALAZINE  10 mg Oral Q8H   losartan  25 mg Per Tube Daily   mouth rinse  15 mL Mouth Rinse BID   midazolam       pneumococcal 23 valent vaccine  0.5 mL Intramuscular Tomorrow-1000   scopolamine  1 patch Transdermal Q72H   thiamine  100 mg Per Tube Daily   Continuous Infusions:  dextrose 5 % and 0.45 % NaCl with KCl 40 mEq/L     feeding supplement (OSMOLITE 1.2 CAL)     potassium chloride 10 mEq (10/04/20 1432)     LOS: 16 days    Time spent: 35 minutes    Sumaya Riedesel Hoover Brunette, DO [facility]  If 7PM-7AM, please contact night-coverage [email or web address] 10/04/2020, 2:57 PM

## 2020-10-04 NOTE — Progress Notes (Signed)
Pharmacy Antibiotic Note  Kendra Lee is a 65 y.o. female admitted on 09/17/2020 with sepsis.  Pharmacy has been consulted for zosyn & vancomycin dosing.  Plan: Vancomycin load ~25 mg/kg (1000mg ) x1 Vancomycin 750mg  IV every 24 hours.  Goal trough 15-20 mcg/mL. Zosyn 3.375g IV q8h (4 hour infusion).  Height: 5\' 1"  (154.9 cm) Weight: 42.1 kg (92 lb 13 oz) IBW/kg (Calculated) : 47.8  Temp (24hrs), Avg:99.4 F (37.4 C), Min:98.1 F (36.7 C), Max:101.8 F (38.8 C)  Recent Labs  Lab 10/01/20 0425 10/02/20 0620 10/03/20 0330 10/04/20 0410  WBC 13.1* 9.8  --   --   CREATININE 0.61 0.59 0.61 0.59    Estimated Creatinine Clearance: 46 mL/min (by C-G formula based on SCr of 0.59 mg/dL).    No Known Allergies  Antimicrobials this admission: Cefazolin 2gm x1 7/13 @ 10:00  Dose adjustments this admission: N/a  Microbiology results: 7/13 BCx: pending 7/13 UCx: pending  7/13 MRSA PCR: pending  Thank you for allowing pharmacy to be a part of this patient's care.  8/13 Kendra Lee 10/04/2020 6:10 PM

## 2020-10-04 NOTE — TOC Progression Note (Addendum)
Transition of Care [facility]) - Progression Note    Patient Details  Name: Kendra Lee MRN: [id number] Date of Birth: [birth year]  Transition of [facility]) CM/SW Contact  Leitha Bleak, RN Phone Number: 10/04/2020, 10:55 AM  Clinical Narrative:   Decision was made for patient to go to IR today to have peg tube replaced. Patient will discharge to [facility] when medically ready and insurance authorization received. Insurance is requesting prior level of functioning. TOC spoke with her son Fayrene Fearing. Prior to this hospitalization patient lived independently, cooked her own meals, performing her ADL's, no assists needed in the home. Her family would mow and provided transportation. " She would come and go as she pleased."    Addendum :  INS Auth approved thru 7/19th per Eunice Blase at [facility].   Expected Discharge Plan: Skilled Nursing Facility Barriers to Discharge: Insurance Authorization, Continued Medical Work up  Expected Discharge Plan and Services Expected Discharge Plan: Skilled Nursing Facility      Living arrangements for the past 2 months: Single Family Home Expected Discharge Date: 09/24/20                    Readmission Risk Interventions Readmission Risk Prevention Plan 09/20/2020  Medication Screening Complete  Transportation Screening Complete

## 2020-10-04 NOTE — Progress Notes (Signed)
   10/04/20 1730  Assess: MEWS Score  Temp (!) 101.8 F (38.8 C)  BP 136/71  Pulse Rate 99  Resp (!) 26  SpO2 93 %  O2 Device Room Air  Assess: MEWS Score  MEWS Temp 2  MEWS Systolic 0  MEWS Pulse 0  MEWS RR 2  MEWS LOC 1  MEWS Score 5  MEWS Score Color Red  Assess: if the MEWS score is Yellow or Red  Were vital signs taken at a resting state? Yes  Focused Assessment Change from prior assessment (see assessment flowsheet)  Early Detection of Sepsis Score *See Row Information* Medium  MEWS guidelines implemented *See Row Information* Yes  Treat  MEWS Interventions Administered prn meds/treatments;Escalated (See documentation below)  Pain Scale 0-10  Pain Score 0  Take Vital Signs  Increase Vital Sign Frequency  Red: Q 1hr X 4 then Q 4hr X 4, if remains red, continue Q 4hrs  Escalate  MEWS: Escalate Red: discuss with charge nurse/RN and provider, consider discussing with RRT  Notify: Charge Nurse/RN  Name of Charge Nurse/RN Notified Jeannie Done, RN  Date Charge Nurse/RN Notified 10/04/20  Time Charge Nurse/RN Notified 1735  Notify: Provider  Provider Name/Title Dr. Sherryll Burger  Date Provider Notified 10/04/20  Time Provider Notified 1731  Notification Type Page  Notification Reason Change in status  Provider response See new orders  Date of Provider Response 10/04/20  Time of Provider Response 1732  Document  Patient Outcome Stabilized after interventions  Progress note created (see row info) Yes

## 2020-10-04 NOTE — Progress Notes (Signed)
  Speech Language Pathology Treatment: Dysphagia  Patient Details Name: Kendra Lee MRN: [id number] DOB: March 18, 1956 Today's Date: 10/04/2020 Time: 7026-3785 SLP Time Calculation (min) (ACUTE ONLY): 27 min  Assessment / Plan / Recommendation Clinical Impression  Ongoing diagnostic dysphagia therapy provided today. SLP provided oral care; Pt's oral cavity presents with copious dry secretions, lingual surface with white coating that was cracked and dry -- despite thorough oral care provided oral cavity remains coated and in poor condition. Further note Pt reports significant soreness in her gums and SLP notes appearance of irritation, redness and swelling around the base of her remaining dentition. Further stress the importance of frequent oral care despite the fact that oral care is uncomfortable for the Patient.   After oral care, SLP provided single ice chips - Pt with immediate and delayed cough. With HTL trials, no coughing was noted. Pt consumed 3 tsp bites of pudding with maximal verbal cues. All trials were consumed with oral holding, prolonged and labored oral prep stage requiring mod to maximal verbal cues to "swallow". Note, Pt is fatigued from activity surrounding and the PEG being replaced today.   Continue NPO status with frequent oral care (QID at minimum) with PO trials with SLP only. Meds via alternative means. ST will continue to follow acutely.      HPI HPI: 65 y.o. female  with past medical history of no known medical history admitted on 09/17/2020 with acute metabolic encephalopathy, CVA acute embolic stroke. MRI shows Scattered small vessel infarcts in the bilateral thalamus,   bilateral internal capsule, and bilateral hemispheric white matter. Pt s/p PEG on Friday due to severity of recent stroke with dysphagia.      SLP Plan  Continue with current plan of care;MBS       Recommendations  Diet recommendations: NPO (Trials with SLP only) Liquids provided via:  Teaspoon;Cup Medication Administration: Via alternative means Compensations: Slow rate;Small sips/bites                Oral Care Recommendations: Oral care QID;Oral care prior to ice chip/H20;Staff/trained caregiver to provide oral care Follow up Recommendations: Skilled Nursing facility SLP Visit Diagnosis: Dysphagia, unspecified (R13.10) Plan: Continue with current plan of care;MBS       Janziel Hockett [name], CCC-SLP Speech Language Pathologist    [name] 10/04/2020, 4:32 PM

## 2020-10-04 NOTE — Procedures (Signed)
Pre procedure Dx: Dysphagia Post Procedure Dx: Same  Successful fluoroscopic guided insertion of a new pull through gastrostomy tube.   The gastrostomy tube may be used immediately for medications.   Tube feeds may be initiated in 24 hours as per the primary team.    EBL: Trace Complications: None immediate  Katherina Right, MD Pager #: [phone]

## 2020-10-04 NOTE — Progress Notes (Signed)
Nutrition Follow up  DOCUMENTATION CODES:   Underweight  INTERVENTION:  When patient is cleared to resume tube feeding: Osmolite 1.2 @ 40 ml/hr via PEG and increase q 4hr to goal rate of 55 ml/hr.   Tube feeding regimen provides 1584 kcal (100% of needs), 73 grams of protein, and 1082 ml of H2O.    Free water flushes 30 ml before and after meds and 200 ml -TID per tube.   Request updated weight  NUTRITION DIAGNOSIS:   Inadequate oral intake related to inability to eat (recent stroke) as evidenced by NPO status. -addressed by enteral feeding  GOAL:  Patient will meet greater than or equal to 90% of their needs -MET  MONITOR:  Labs, Weight trends, Skin (plan of care regarding nutrition support)   ASSESSMENT: Patient is an underweight 65 yo female with acute encephalopathy, acute ischemic stroke.   6/29- Palliative Care addressed [place] with son.   ST evaluation today reviewed. Dysphagia-NPO for now. Risk for aspiration PNA. No family at bedside during RD visit. Talked with her nurse. If tube feeding becomes an option. Recommendations provided above. If expected to be short-term 6 weeks or less consider NGT placement.   7/1 PEG tube placed by IR  7/2 Enteral feeding initiated- Osmolite 1.2 (continuous) with goal rate 55 ml/hr.   7/5 Nursing at bedside during RD visit. She is tolerating tube feeds at goal, up in chair and more awake than during last visit. Disposition-short term rehab- planning in process. Palliative following. Continue current tube feeding regimen. Last BM????  7/7 talked with nursing this morning -pt tube feeding stopped due to abdominal pain. Last BM- small today and medium yesterday. No current complaints from patient. Will resume at lower rate.   7/8 Talked with nursing RN/NT. Patient tolerating tube feeds at current rate. Will advance to goal rate. Body weight updated today by NT- 44.7 kg.  Patient continues to be ready medically and waiting for SNF  -insurance auth.   7/11 Patient removed PEG tube after RD visit 7/8. IR at [facility] contacted to set up replacement early this week. She remains unsafe for oral intake -NPO. IV fluids provided -D5/NS @ 65 ml/hr currently.   7/12- MBSS completed. Continue NPO per ST. Trials of D1/ tsp HTL with SLP.  7/13 Patient had PEG replaced this morning IR -[facility]. Cleared for medications today via PEG and tube feeding after 24 hr. Recommend resume Osmolite 1.2 @ 40 ml/hr and free water as noted above is no IVF on board. Requested current body weight to reassess needs.   Medications:  B-1, Lipitor.  Labs reviewed: BMP Latest Ref Rng & Units 10/04/2020 10/03/2020 10/02/2020  Glucose 70 - 99 mg/dL 113(H) 109(H) 119(H)  BUN 8 - 23 mg/dL 13 18 24(H)  Creatinine 0.44 - 1.00 mg/dL 0.59 0.61 0.59  Sodium 135 - 145 mmol/L 143 145 145  Potassium 3.5 - 5.1 mmol/L 2.9(L) 2.9(L) 3.0(L)  Chloride 98 - 111 mmol/L 112(H) 113(H) 112(H)  CO2 22 - 32 mmol/L _0 Calcium 8.9 - 10.3 mg/dL 8.0(L) 8.5(L) 8.7(L)     Diet Order:   Diet Order             Diet NPO time specified Except for: Sips with Meds  Diet effective midnight                   EDUCATION NEEDS:   Not appropriate for education at this time  Skin:  Skin Assessment: Reviewed RN Assessment  PEG-placed LUQ 7/13  Last BM:  7/12 type 3 small  Height:   Ht Readings from Last 1 Encounters:  09/18/20 _0  (1.549 m)    Weight:   Wt Readings from Last 1 Encounters:  09/18/20 42.1 kg    Ideal Body Weight:   48 kg  BMI:  Body mass index is 17.54 kg/m.  Estimated Nutritional Needs:   Kcal:  8599-2341  Protein:  65-72 gr  Fluid:  >1200 ml daily   Colman Cater MS,RD,CSG,LDN Contact: Shea Evans

## 2020-10-04 NOTE — Progress Notes (Signed)
   10/04/20 1322  Assess: MEWS Score  Temp 98.9 F (37.2 C)  BP (!) 190/88  Pulse Rate 94  Resp (!) 24  SpO2 95 %  O2 Device Room Air  Assess: MEWS Score  MEWS Temp 0  MEWS Systolic 0  MEWS Pulse 0  MEWS RR 1  MEWS LOC 1  MEWS Score 2  MEWS Score Color Yellow  Assess: if the MEWS score is Yellow or Red  Were vital signs taken at a resting state? Yes  Focused Assessment No change from prior assessment  Early Detection of Sepsis Score *See Row Information* Medium  MEWS guidelines implemented *See Row Information* No, previously yellow, continue vital signs every 4 hours  Treat  MEWS Interventions Administered scheduled meds/treatments  Pain Scale 0-10  Pain Score 0

## 2020-10-04 NOTE — Care Management Important Message (Signed)
Important Message  Patient Details  Name: Kendra Lee MRN: [id number] Date of Birth: November 01, 1955   Medicare Important Message Given:  Yes (spoke with son, Donnetta Hail at [phone])     Corey Harold 10/04/2020, 11:35 AM

## 2020-10-04 NOTE — Progress Notes (Signed)
PT Cancellation Note  Patient Details Name: Kendra Lee MRN: [id number] DOB: 12-03-1955   Cancelled Treatment:    Reason Eval/Treat Not Completed: Patient at procedure or test/unavailable Pt at [facility] for procedure, unavailable for therapy session.  Becky Sax, LPTA/CLT; CBIS [phone]  Juel Burrow 10/04/2020, 10:17 AM

## 2020-10-04 NOTE — H&P (Signed)
Chief Complaint: Patient was seen in consultation today for image guided gastrostomy tube placement Chief Complaint  Patient presents with   Altered Mental Status   at the request of Dr. Laural Benes, C.  Referring Physician(s): Laural Benes, C.  Supervising Physician: Simonne Come  Patient Status: [facility] - Out-pt  History of Present Illness: Kendra Lee is a 65 y.o. 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  Patient was hospitalized for further evaluation and management and due to poor oral oral intake, patient underwent balloon retention G-tube placement with IR on 09/22/2020 which unfortunately dislodged on 09/29/2020.  RN was not able to place anything in the tract.  IR was requested for gastrostomy tube replacement. Case was reviewed by Dr. Elby Showers, who recommends new gastrostomy tube placement.  After thorough discussion with her family members, patient decided to proceed with new gastrostomy tube placement with IR.  Patient laying in bed, breathing is labored but not in acute distress.  Denise headache, fever, chills, shortness of breath, cough, chest pain, abdominal pain, nausea ,vomiting, and bleeding.   Past Medical History:  Diagnosis Date   Smoker    2 ppd x 55 years    Past Surgical History:  Procedure Laterality Date   IR GASTROSTOMY TUBE MOD SED  09/22/2020    Allergies: Patient has no known allergies.  Medications: Prior to Admission medications   Medication Sig Start Date End Date Taking? Authorizing Provider  amLODipine (NORVASC) 5 MG tablet Place 1 tablet (5 mg total) into feeding tube daily. 09/24/20   Catarina Hartshorn, MD  aspirin 81 MG chewable tablet Place 1 tablet (81 mg total) into feeding tube daily. 09/24/20   Catarina Hartshorn, MD  atorvastatin (LIPITOR) 80 MG tablet Place 1 tablet (80 mg total) into feeding tube daily. 09/24/20   Catarina Hartshorn, MD  clopidogrel (PLAVIX) 75 MG tablet Place 1 tablet (75 mg  total) into feeding tube daily. 09/24/20   Catarina Hartshorn, MD  hydrALAZINE (APRESOLINE) 10 MG tablet Take 1 tablet (10 mg total) by mouth every 8 (eight) hours. 09/24/20   Catarina Hartshorn, MD  losartan (COZAAR) 25 MG tablet Place 1 tablet (25 mg total) into feeding tube daily. 09/24/20   Catarina Hartshorn, MD  Nutritional Supplements (FEEDING SUPPLEMENT, OSMOLITE 1.2 CAL,) LIQD Infuse at 55 cc/hour via pump 09/24/20   Tat, David, MD  thiamine 100 MG tablet Place 1 tablet (100 mg total) into feeding tube daily. 09/24/20   Catarina Hartshorn, MD  Water For Irrigation, Sterile (FREE WATER) SOLN Place 200 mLs into feeding tube every 8 (eight) hours. 09/24/20   Catarina Hartshorn, MD     History reviewed. No pertinent family history.  Social History   Socioeconomic History   Marital status: Legally Separated    Spouse name: Not on file   Number of children: Not on file   Years of education: Not on file   Highest education level: Not on file  Occupational History   Not on file  Tobacco Use   Smoking status: Not on file   Smokeless tobacco: Not on file  Substance and Sexual Activity   Alcohol use: Not on file   Drug use: Not on file   Sexual activity: Not on file  Other Topics Concern   Not on file  Social History Narrative   Not on file   Social Determinants of Health   Financial Resource Strain: Not on file  Food Insecurity: Not  on file  Transportation Needs: Not on file  Physical Activity: Not on file  Stress: Not on file  Social Connections: Not on file     Review of Systems: A 12 point ROS discussed and pertinent positives are indicated in the HPI above.  All other systems are negative.   Vital Signs: BP (!) 170/70 (BP Location: Left Arm)   Pulse 83   Temp 98.7 F (37.1 C)   Resp 14   Ht  (1.549 m)   Wt 92 lb 13 oz (42.1 kg)   LMP  (LMP Unknown)   SpO2 93%   BMI 17.54 kg/m   Physical Exam Vitals reviewed.  Constitutional:      General: She is not in acute distress.    Appearance: She is  ill-appearing.     Comments: Lethargic  HENT:     Head: Normocephalic and atraumatic.     Mouth/Throat:     Mouth: Mucous membranes are moist.  Cardiovascular:     Rate and Rhythm: Normal rate and regular rhythm.     Pulses: Normal pulses.     Heart sounds: Normal heart sounds.  Pulmonary:     Breath sounds: Normal breath sounds.     Comments: Breathing is labored Abdominal:     General: Abdomen is flat. Bowel sounds are normal.     Palpations: Abdomen is soft.  Skin:    General: Skin is warm and dry.     Coloration: Skin is not jaundiced or pale.     Comments: Puncture site for previous G-tube noted on upper abdomen, site appears well-healed, no erythema, TTP, drainage, or bleeding.  Neurological:     Mental Status: She is oriented to person, place, and time.  Psychiatric:        Mood and Affect: Mood normal.        Behavior: Behavior normal.        Judgment: Judgment normal.    MD Evaluation Airway: WNL Heart: WNL Abdomen: WNL Chest/ Lungs: WNL ASA  Classification: 3 Mallampati/Airway Score: Two  Imaging: CT ABDOMEN WO CONTRAST  Result Date: 09/21/2020 CLINICAL DATA:  Evaluate anatomy prior to potential percutaneous gastrostomy tube placement. EXAM: CT ABDOMEN WITHOUT CONTRAST TECHNIQUE: Multidetector CT imaging of the abdomen was performed following the standard protocol without IV contrast. COMPARISON:  None. FINDINGS: The lack of intravenous contrast limits the ability to evaluate solid abdominal organs. Lower chest: Limited visualization of the lower thorax demonstrates minimal dependent subpleural atelectasis. Normal heart size. No pericardial effusion. Hepatobiliary: Normal hepatic contour. Normal noncontrast appearance of the gallbladder given degree distention. No radiopaque gallstones. There is a trace amount of perihepatic ascites. Pancreas: Normal noncontrast appearance of the pancreas. Spleen: Normal noncontrast appearance of the spleen. 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 Normal noncontrast appearance of the right kidney. No renal stones. No right-sided urinary obstruction. Normal noncontrast appearance the bilateral adrenal glands. The urinary bladder was not imaged. Stomach/Bowel: The anterior wall the stomach is well apposed against the ventral wall of the abdomen without interposition of the hepatic parenchyma or the transverse colon. Moderate colonic stool burden without evidence of enteric obstruction. No pneumoperitoneum, pneumatosis or portal venous gas. Vascular/Lymphatic: Large amount of irregular calcified atherosclerotic plaque within a bilobed ectatic abdominal aorta with dominant caudal component measuring 2.6 cm and maximal oblique short axis coronal diameter (coronal image 33, series 5). No bulky retroperitoneal or mesenteric lymphadenopathy. Other: Regional soft tissues appear normal.  Musculoskeletal: No acute or aggressive osseous abnormalities. Mild DDD of L4-L5 with disc space height loss, endplate irregularity and sclerosis. IMPRESSION: 1. Gastric anatomy amenable to potential percutaneous gastrostomy tube placement as indicated. 2. 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 3. Mildly ectatic abdominal aorta measuring 2.6 cm in maximal diameter. Recommend follow-up aortic ultrasound in 5 years. This recommendation follows ACR consensus guidelines: White Paper of the ACR Incidental Findings Committee II on Vascular Findings. J Am Coll Radiol 2013; 10:789-794. 4. Aortic aneurysm NOS (ICD10-I71.9). Electronically Signed   By: Simonne Come M.D.   On: 09/21/2020 09:36   CT Head Wo Contrast  Result Date: 09/17/2020 CLINICAL DATA:  Mental status change, found this afternoon on couch EXAM: CT HEAD WITHOUT CONTRAST TECHNIQUE: Contiguous axial images were obtained from the base of the skull  through the vertex without intravenous contrast. COMPARISON:  None. FINDINGS: Brain: Foci of hypoattenuation present in the right internal capsule as as a separate focus in the right caudate and posterior limb of the left internal capsule could reflect sequela of age-indeterminate lacunar type infarcts difficult to fully assess given a background of more diffuse patchy white matter hypoattenuation typically indicative of microvascular angiopathy in a patient of this age. Additional hypoattenuation in the pons and brainstem is more nonspecific given streak artifact across skull base. Symmetric prominence of the ventricles, cisterns and sulci compatible with parenchymal volume loss. No hyperdense hemorrhage. No mass effect or midline shift. No extra-axial collection. Scattered benign dural calcifications. Vascular: Atherosclerotic calcification of the carotid siphons. No hyperdense vessel. Skull: No calvarial fracture or suspicious osseous lesion. No scalp swelling or hematoma. Sinuses/Orbits: Paranasal sinuses and mastoid air cells are predominantly clear. Other: Included orbital structures are unremarkable. 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 Could be further characterized with MR imaging as warranted. No other acute intracranial abnormality. These results were called by telephone at the time of interpretation on 09/17/2020 at 11:45 pm to provider Benjiman Core , who verbally acknowledged these results. Electronically Signed   By: Kreg Shropshire M.D.   On: 09/17/2020 23:45   MR ANGIO HEAD WO CONTRAST  Result Date: 09/18/2020 CLINICAL DATA:  Altered mental status. EXAM: MRI HEAD WITHOUT CONTRAST MRA HEAD WITHOUT CONTRAST TECHNIQUE:  Multiplanar, multi-echo pulse sequences of the brain and surrounding structures were acquired without intravenous contrast. Angiographic images of the Circle of Willis were acquired using MRA technique without intravenous contrast. COMPARISON:  No pertinent prior exam. FINDINGS: MRI HEAD FINDINGS Brain: Restricted diffusion in the bilateral thalamus, bilateral internal capsule, left centrum semiovale, and right subcortical frontal. These have the appearance of acute infarcts. Mild small vessel ischemic type change in the pons and hemispheric white matter. No evidence of hemorrhage, hydrocephalus, or collection. Vascular: Preserved flow voids. Skull and upper cervical spine: Normal marrow signal Sinuses/Orbits: Negative Other: Significant and progressive motion artifact MRA HEAD FINDINGS Very motion degraded. Carotid, vertebral, and basilar arteries and major branches are patent with gross symmetry. IMPRESSION: Brain MRI: 1. 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 These are not in a unified arterial or venous distribution and are of uncertain cause. No revealing background chronic brain findings. 2. Significantly motion degraded Intracranial MRA: Very motion  degraded and of limited utility other than documenting patency of major vessels. Electronically Signed   By: Marnee SpringJonathon  Watts M.D.   On: 09/18/2020 08:43   MR BRAIN WO CONTRAST  Result Date: 09/18/2020 CLINICAL DATA:  Altered mental status. EXAM: MRI HEAD WITHOUT CONTRAST MRA HEAD WITHOUT CONTRAST TECHNIQUE: Multiplanar, multi-echo pulse sequences of the brain and surrounding structures were acquired without intravenous contrast. Angiographic images of the Circle of Willis were acquired using MRA technique without intravenous contrast. COMPARISON:  No pertinent prior exam. FINDINGS: MRI HEAD FINDINGS Brain: Restricted diffusion in the bilateral thalamus, bilateral internal  capsule, left centrum semiovale, and right subcortical frontal. These have the appearance of acute infarcts. Mild small vessel ischemic type change in the pons and hemispheric white matter. No evidence of hemorrhage, hydrocephalus, or collection. Vascular: Preserved flow voids. Skull and upper cervical spine: Normal marrow signal Sinuses/Orbits: Negative Other: Significant and progressive motion artifact MRA HEAD FINDINGS Very motion degraded. Carotid, vertebral, and basilar arteries and major branches are patent with gross symmetry. IMPRESSION: Brain MRI: 1. 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 These are not in a unified arterial or venous distribution and are of uncertain cause. No revealing background chronic brain findings. 2. Significantly motion degraded Intracranial MRA: Very motion degraded and of limited utility other than documenting patency of major vessels. Electronically Signed   By: Marnee SpringJonathon  Watts M.D.   On: 09/18/2020 08:43   IR GASTROSTOMY TUBE MOD SED  Result Date: 09/22/2020 INDICATION: 65 year old with altered mental status and dysphagia. Request for gastrostomy tube placement. EXAM: PERCUTANEOUS GASTROSTOMY TUBE WITH FLUOROSCOPIC GUIDANCE Physician: Rachelle HoraAdam R. Lowella DandyHenn, MD MEDICATIONS: Ancef 2 g; Antibiotics were administered within 1 hour of the procedure. Glucagon 0.5 mg ANESTHESIA/SEDATION: Versed 2.0 mg IV; Fentanyl 100 mcg IV Moderate Sedation Time:  42 minutes The patient was continuously monitored during the procedure by the interventional radiology nurse under my direct supervision. FLUOROSCOPY TIME:  Fluoroscopy Time: 12 minutes, 6 seconds, 19 mGy CONTRAST:  20 mL Omnipaque 300 COMPLICATIONS: None immediate. PROCEDURE: Informed consent was obtained for a percutaneous gastrostomy tube. The patient was placed on the interventional table. An orogastric tube was placed with fluoroscopic guidance. The  anterior abdomen was prepped and draped in sterile fashion. Maximal barrier sterile technique was utilized including caps, mask, sterile gowns, sterile gloves, sterile drape, hand hygiene and skin antiseptic. Stomach was inflated with air through the orogastric tube. The skin and subcutaneous tissues were anesthetized with 1% lidocaine. A 17 gauge needle was directed into the distended stomach with fluoroscopic guidance. A wire was advanced into the stomach and a T fastener was deployed. A 9-French vascular sheath was placed and the orogastric tube was snared using a Gooseneck snare device. At this time, the patient was clenching her teeth and we could not open her mouth in order to place the pull-through gastrostomy tube. As a result, we converted to a push through gastrostomy tube. Skin around the 9 French drain was anesthetized with 1% lidocaine. Using fluoroscopic guidance, 2 additional T-fasteners were deployed. The T fastener associated with the 9 French sheath was cut. The 9 French sheath was removed and dilated up to an 18 [place]French peel-away sheath. A 16 French Entuit gastrostomy tube was advanced over the wire and through the peel-away sheath. The balloon was inflated with approximately 8 mL of saline. Peel-away sheath and wire were removed. Contrast injection confirmed placement in the stomach. Fluoroscopic images were obtained for documentation. The  gastrostomy tube was flushed with normal saline. FINDINGS: 84 French balloon retention gastrostomy tube is positioned in the stomach. Total of 3 T-fasteners were deployed and there are still 2 T-fasteners intact. Contrast injection confirmed placement in the stomach. IMPRESSION: 1. Successful placement of a percutaneous gastrostomy tube using fluoroscopy. This is a balloon retention gastrostomy tube. 2. There are 2 remaining T-fasteners in place. Plan to cut the T-fasteners in 7-10 days. Electronically Signed   By: Richarda Overlie M.D.   On: 09/22/2020 12:07   US  Carotid Bilateral (at [facility] and [facility] only)  Result Date: 09/18/2020 CLINICAL DATA:  Altered mental status Hypertension Stroke EXAM: BILATERAL CAROTID DUPLEX ULTRASOUND TECHNIQUE: Wallace Cullens scale imaging, color Doppler and duplex ultrasound were performed of bilateral carotid and vertebral arteries in the neck. COMPARISON:  None. FINDINGS: Criteria: Quantification of carotid stenosis is based on velocity parameters that correlate the residual internal carotid diameter with NASCET-based stenosis levels, using the diameter of the distal internal carotid lumen as the denominator for stenosis measurement. The following velocity measurements were obtained: RIGHT ICA: 156/29 cm/sec CCA: 77/11 cm/sec SYSTOLIC ICA/CCA RATIO:  2.0 ECA: 173 cm/sec LEFT ICA: 83/18 cm/sec CCA: 85/14 cm/sec SYSTOLIC ICA/CCA RATIO:  1.0 ECA: 92 cm/sec RIGHT CAROTID ARTERY: Heterogeneous calcified and noncalcified plaque of the distal common and proximal internal carotid arteries with velocity indicative of 50-69% stenosis. RIGHT VERTEBRAL ARTERY:  Antegrade flow. LEFT CAROTID ARTERY: Heterogeneous calcified and noncalcified plaque of the distal common and proximal internal carotid arteries with velocity parameters indicative of less than 50% stenosis. LEFT VERTEBRAL ARTERY:  Antegrade flow. IMPRESSION: 1. 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 2. Less than 50% stenosis of the left internal carotid artery. 3. Long segment shadowing plaque present in both common and internal carotid arteries, which can obscure higher velocities. Further evaluation with MRI or CT of the neck should be performed for more precise evaluation of degree of stenosis. Electronically Signed   By: Acquanetta Belling M.D.   On: 09/18/2020 10:01   DG CHEST PORT 1 VIEW  Result Date: 10/01/2020 CLINICAL DATA:  Altered mental status, leukocytosis.  Former smoker. EXAM: PORTABLE CHEST 1 VIEW COMPARISON:  Chest x-ray dated 09/17/2020. FINDINGS: Heart size and mediastinal  contours are stable. Lungs are hyperexpanded. Coarse lung markings are again seen bilaterally without significant interval change. No new lung findings. No pleural effusion or pneumothorax is seen. Osseous structures about the chest are unremarkable. IMPRESSION: 1. No active disease. No evidence of pneumonia or pulmonary edema. 2. Hyperexpanded lungs indicating COPD. 3. Chronic interstitial lung disease. Electronically Signed   By: Bary Richard M.D.   On: 10/01/2020 09:44   DG Chest Portable 1 View  Result Date: 09/17/2020 CLINICAL DATA:  Altered mental status EXAM: PORTABLE CHEST 1 VIEW COMPARISON:  None FINDINGS: There is hyperinflation of the lungs compatible with COPD. Heart and mediastinal contours are within normal limits. No focal opacities or effusions. No acute bony abnormality. IMPRESSION: COPD.  No active disease. Electronically Signed   By: Charlett Nose M.D.   On: 09/17/2020 23:02   DG Swallowing Func-Speech Pathology  Result Date: 10/03/2020 Formatting of this result is different from the original. Objective Swallowing Evaluation: Type of Study: MBS-Modified Barium Swallow Study  Patient Details Name: Hila Bolding MRN: [id number] Date of Birth: April 15, 1955 Today's Date: 10/03/2020 Time: SLP Start Time (ACUTE ONLY): 1436 -SLP Stop Time (ACUTE ONLY): 1515 SLP Time Calculation (min) (ACUTE ONLY): 39 min Past Medical History: Past Medical History: Diagnosis Date  Smoker   2 ppd x 55 years Past Surgical History: Past Surgical History: Procedure Laterality Date  IR GASTROSTOMY TUBE MOD SED  09/22/2020 HPI: 65 y.o. female  with past medical history of no known medical history admitted on 09/17/2020 with acute metabolic encephalopathy, CVA acute embolic stroke. MRI shows Scattered small vessel infarcts in the bilateral thalamus,   bilateral internal capsule, and bilateral hemispheric white matter. Pt s/p PEG on Friday due to severity of recent stroke with dysphagia.  Subjective: "squash" when asked what  she grows in the garden Assessment / Plan / Recommendation CHL IP CLINICAL IMPRESSIONS 10/03/2020 Clinical Impression Pt presents with moderate/severe sensorimotor oropharyngeal dysphagia characterized by gross oral weakness and trace silent aspiration of thin liquids and NTL; swallowing function is negatively impacted by fluctuating alertness. Note anterior spillage with all liquids presented and prolonged AP transit and delayed swallowing trigger, triggered at the level of the valleculae. With thin liquids and NTL, note trace silent aspiration before and during the swallow; No reflexive cough and cued cough is VERY weak and not effective in clearing aspirates. With HTL note one episode of deep flash penetration to the cords. Pt consumed Puree textures (requires feeder and cues to open mouth wide enough to insert spoon for bolus), with prolonged oral prep, lingual pumping and a delayed swallowing trigger at the level of the vallecuale; laryngeal vestibule closure is good with solids and pharyngeal squeeze is adequate with trace residuals after the swallow. Note trials of each texture/consistency were very limited to 1-3 presentations of each secondary to fatigue. Recommend alternative means of nutrition and initiate dysphagia therapy and trials of D1/puree and tsp HTL with SLP only. Recommend repeat MBS when clinically appropriate. ST will continue to follow SLP Visit Diagnosis Dysphagia, unspecified (R13.10) Attention and concentration deficit following -- Frontal lobe and executive function deficit following -- Impact on safety and function Severe aspiration risk;Risk for inadequate nutrition/hydration   CHL IP TREATMENT RECOMMENDATION 10/03/2020 Treatment Recommendations Therapy as outlined in treatment plan below   Prognosis 10/03/2020 Prognosis for Safe Diet Advancement Guarded Barriers to Reach Goals Severity of deficits Barriers/Prognosis Comment -- CHL IP DIET RECOMMENDATION 10/03/2020 SLP Diet Recommendations  NPO;Alternative means - long-term;Ice chips PRN after oral care Liquid Administration via Spoon Medication Administration Via alternative means Compensations Slow rate;Small sips/bites Postural Changes Seated upright at 90 degrees   CHL IP OTHER RECOMMENDATIONS 10/03/2020 Recommended Consults -- Oral Care Recommendations Oral care BID Other Recommendations Have oral suction available   CHL IP FOLLOW UP RECOMMENDATIONS 10/03/2020 Follow up Recommendations Skilled Nursing facility   [facility] IP FREQUENCY AND DURATION 10/03/2020 Speech Therapy Frequency (ACUTE ONLY) min 3x week Treatment Duration 2 weeks      CHL IP ORAL PHASE 10/03/2020 Oral Phase Impaired Oral - Pudding Teaspoon -- Oral - Pudding Cup -- Oral - Honey Teaspoon NT Oral - Honey Cup Left anterior bolus loss;Right anterior bolus loss;Impaired mastication;Weak lingual manipulation;Reduced posterior propulsion;Holding of bolus;Lingual/palatal residue;Piecemeal swallowing;Delayed oral transit;Decreased bolus cohesion;Premature spillage Oral - Nectar Teaspoon Left anterior bolus loss;Right anterior bolus loss;Impaired mastication;Weak lingual manipulation;Reduced posterior propulsion;Holding of bolus;Lingual/palatal residue;Piecemeal swallowing;Delayed oral transit;Decreased bolus cohesion;Premature spillage Oral - Nectar Cup Left anterior bolus loss;Right anterior bolus loss;Impaired mastication;Weak lingual manipulation;Reduced posterior propulsion;Holding of bolus;Lingual/palatal residue;Piecemeal swallowing;Delayed oral transit;Decreased bolus cohesion;Premature spillage Oral - Nectar Straw NT Oral - Thin Teaspoon Left anterior bolus loss;Right anterior bolus loss;Impaired mastication;Weak lingual manipulation;Reduced posterior propulsion;Holding of bolus;Lingual/palatal residue;Piecemeal swallowing;Delayed oral transit;Decreased bolus cohesion;Premature spillage Oral - Thin Cup Left anterior bolus loss;Right  anterior bolus loss;Impaired mastication;Weak lingual  manipulation;Reduced posterior propulsion;Holding of bolus;Lingual/palatal residue;Piecemeal swallowing;Delayed oral transit;Decreased bolus cohesion;Premature spillage Oral - Thin Straw NT Oral - Puree Weak lingual manipulation;Lingual pumping;Reduced posterior propulsion;Holding of bolus;Lingual/palatal residue;Piecemeal swallowing;Delayed oral transit;Decreased bolus cohesion Oral - Mech Soft NT Oral - Regular NT Oral - Multi-Consistency NT Oral - Pill NT Oral Phase - Comment --  [facility] IP PHARYNGEAL PHASE 10/03/2020 Pharyngeal Phase Impaired Pharyngeal- Pudding Teaspoon -- Pharyngeal -- Pharyngeal- Pudding Cup -- Pharyngeal -- Pharyngeal- Honey Teaspoon Reduced airway/laryngeal closure;Penetration/Aspiration during swallow;Pharyngeal residue - valleculae;Pharyngeal residue - pyriform;Reduced epiglottic inversion Pharyngeal -- Pharyngeal- Honey Cup Reduced airway/laryngeal closure;Penetration/Aspiration during swallow;Pharyngeal residue - valleculae;Pharyngeal residue - pyriform;Reduced epiglottic inversion Pharyngeal -- Pharyngeal- Nectar Teaspoon NT Pharyngeal -- Pharyngeal- Nectar Cup Reduced airway/laryngeal closure;Penetration/Aspiration during swallow;Pharyngeal residue - valleculae;Pharyngeal residue - pyriform;Reduced epiglottic inversion;Trace aspiration Pharyngeal Material enters airway, passes BELOW cords without attempt by patient to eject out (silent aspiration) Pharyngeal- Nectar Straw NT Pharyngeal -- Pharyngeal- Thin Teaspoon Reduced airway/laryngeal closure;Penetration/Aspiration during swallow;Pharyngeal residue - valleculae;Pharyngeal residue - pyriform;Reduced epiglottic inversion;Trace aspiration Pharyngeal Material enters airway, passes BELOW cords without attempt by patient to eject out (silent aspiration) Pharyngeal- Thin Cup Reduced airway/laryngeal closure;Penetration/Aspiration during swallow;Pharyngeal residue - valleculae;Pharyngeal residue - pyriform;Reduced epiglottic inversion  Pharyngeal Material enters airway, passes BELOW cords without attempt by patient to eject out (silent aspiration) Pharyngeal- Thin Straw NT Pharyngeal -- Pharyngeal- Puree -- Pharyngeal -- Pharyngeal- Mechanical Soft -- Pharyngeal -- Pharyngeal- Regular -- Pharyngeal -- Pharyngeal- Multi-consistency -- Pharyngeal -- Pharyngeal- Pill -- Pharyngeal -- Pharyngeal Comment --  [facility] IP CERVICAL ESOPHAGEAL PHASE 10/03/2020 Cervical Esophageal Phase WFL Pudding Teaspoon -- Pudding Cup -- Honey Teaspoon -- Honey Cup -- Nectar Teaspoon -- Nectar Cup -- Nectar Straw -- Thin Teaspoon -- Thin Cup -- Thin Straw -- Puree -- Mechanical Soft -- Regular -- Multi-consistency -- Pill -- Cervical Esophageal Comment -- Amelia [name], CCC-SLP Speech Language Pathologist [name] 10/03/2020, 3:51 PM              ECHOCARDIOGRAM COMPLETE  Result Date: 09/18/2020    ECHOCARDIOGRAM REPORT   Patient Name:   [facility] Date of Exam: 09/18/2020 Medical Rec #:  [id number]       Height: Accession #:    [id number]      Weight: Date of Birth:  1955-08-04       BSA: Patient Age:    64 years        BP:           213/100 mmHg Patient Gender: F               HR:           59 bpm. Exam Location:  [facility] Procedure: 2D Echo, Cardiac Doppler and Color Doppler Indications:    Stroke  History:        Patient has no prior history of Echocardiogram examinations.                 Stroke; Risk Factors:Hypertension. Height and Weight not                 available.  Sonographer:    Mikki Harbor Referring Phys: [id number] ASIA B ZIERLE-GHOSH  Sonographer Comments: Height and Weight not available IMPRESSIONS  1. Left ventricular ejection fraction, by estimation, is 60 to 65%. The left ventricle has normal function. The left ventricle has no regional wall motion abnormalities. There is mild left ventricular hypertrophy. Left ventricular diastolic parameters were  normal.  2. Right ventricular systolic function is normal. The right ventricular  size is normal. Tricuspid regurgitation signal is inadequate for assessing PA pressure.  3. The mitral valve is grossly normal. Trivial mitral valve regurgitation.  4. The aortic valve is tricuspid. Aortic valve regurgitation is not visualized. Mild aortic valve sclerosis is present, with no evidence of aortic valve stenosis. Aortic valve mean gradient measures 3.0 mmHg.  5. The inferior vena cava is normal in size with greater than 50% respiratory variability, suggesting right atrial pressure of 3 mmHg. FINDINGS  Left Ventricle: Left ventricular ejection fraction, by estimation, is 60 to 65%. The left ventricle has normal function. The left ventricle has no regional wall motion abnormalities. The left ventricular internal cavity size was normal in size. There is  mild left ventricular hypertrophy. Left ventricular diastolic parameters were normal. Right Ventricle: The right ventricular size is normal. No increase in right ventricular wall thickness. Right ventricular systolic function is normal. Tricuspid regurgitation signal is inadequate for assessing PA pressure. Left Atrium: Left atrial size was normal in size. Right Atrium: Right atrial size was normal in size. Pericardium: There is no evidence of pericardial effusion. Presence of pericardial fat pad. Mitral Valve: The mitral valve is grossly normal. Mild mitral annular calcification. Trivial mitral valve regurgitation. MV peak gradient, 2.7 mmHg. The mean mitral valve gradient is 1.0 mmHg. Tricuspid Valve: The tricuspid valve is grossly normal. Tricuspid valve regurgitation is trivial. Aortic Valve: The aortic valve is tricuspid. There is mild aortic valve annular calcification. Aortic valve regurgitation is not visualized. Mild aortic valve sclerosis is present, with no evidence of aortic valve stenosis. Aortic valve mean gradient measures 3.0 mmHg. Aortic valve peak gradient measures 5.7 mmHg. Aortic valve area, by VTI measures 2.71 cm. Pulmonic Valve: The  pulmonic valve was grossly normal. Pulmonic valve regurgitation is trivial. Aorta: The aortic root is normal in size and structure. Venous: The inferior vena cava is normal in size with greater than 50% respiratory variability, suggesting right atrial pressure of 3 mmHg. IAS/Shunts: The interatrial septum appears to be lipomatous. No atrial level shunt detected by color flow Doppler.  LEFT VENTRICLE PLAX 2D LVIDd:         4.38 cm  Diastology LVIDs:         2.91 cm  LV e' medial:    7.10 cm/s LV PW:         1.04 cm  LV E/e' medial:  11.9 LV IVS:        1.06 cm  LV e' lateral:   7.62 cm/s LVOT diam:     2.00 cm  LV E/e' lateral: 11.0 LV SV:         90 LVOT Area:     3.14 cm  RIGHT VENTRICLE RV Basal diam:  3.37 cm RV Mid diam:    3.06 cm RV S prime:     10.20 cm/s TAPSE (M-mode): 2.3 cm LEFT ATRIUM             RIGHT ATRIUM LA diam:        3.00 cm RA Area:     13.30 cm LA Vol (A2C):   49.5 ml RA Volume:   31.30 ml LA Vol (A4C):   28.7 ml LA Biplane Vol: 40.5 ml  AORTIC VALVE AV Area (Vmax):    2.85 cm AV Area (Vmean):   2.55 cm AV Area (VTI):     2.71 cm AV Vmax:  119.00 cm/s AV Vmean:          86.100 cm/s AV VTI:            0.330 m AV Peak Grad:      5.7 mmHg AV Mean Grad:      3.0 mmHg LVOT Vmax:         108.00 cm/s LVOT Vmean:        69.800 cm/s LVOT VTI:          0.285 m LVOT/AV VTI ratio: 0.86  AORTA Ao Root diam: 3.30 cm MITRAL VALVE MV Area (PHT): 3.15 cm    SHUNTS MV Area VTI:   2.54 cm    Systemic VTI:  0.29 m MV Peak grad:  2.7 mmHg    Systemic Diam: 2.00 cm MV Mean grad:  1.0 mmHg MV Vmax:       0.81 m/s MV Vmean:      41.2 cm/s MV Decel Time: 241 msec MV E velocity: 84.20 cm/s MV A velocity: 72.70 cm/s MV E/A ratio:  1.16 Nona Dell MD Electronically signed by Nona Dell MD Signature Date/Time: 09/18/2020/2:03:27 PM    Final     Labs:  CBC: Recent Labs    09/21/20 0339 09/24/20 0426 10/01/20 0425 10/02/20 0620  WBC 7.1 7.2 13.1* 9.8  HGB 13.5 11.7* 11.4* 11.8*  HCT 40.2  35.0* 34.5* 35.2*  PLT 269 274 360 395    COAGS: Recent Labs    09/22/20 0455  INR 1.0    BMP: Recent Labs    10/01/20 0425 10/02/20 0620 10/03/20 0330 10/04/20 0410  NA 140 145 145 143  K 2.9* 3.0* 2.9* 2.9*  CL 107 112* 113* 112*  CO2 GLUCOSE 112* 119* 109* 113*  BUN 36* 24* 18 13  CALCIUM 8.6* 8.7* 8.5* 8.0*  CREATININE 0.61 0.59 0.61 0.59  GFRNONAA >60 >60 >60 >60    LIVER FUNCTION TESTS: Recent Labs    09/17/20 2200 09/18/20 0351  BILITOT 0.7 0.6  AST 19 16  ALT 13 12  ALKPHOS 77 66  PROT 7.5 6.6  ALBUMIN 4.2 3.7    TUMOR MARKERS: No results for input(s): AFPTM, CEA, CA199, CHROMGRNA in the last 8760 hours.  Assessment and Plan: 65 y.o. 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  Patient underwent gastrostomy tube placement with IR on 09/22/2020 due to poor oral intake.  The G-tube unfortunately dislodged on 09/29/2020, RN was not able to place anything in the tract.  IR was requested for G-tube replacement. Case was reviewed by Dr. Elby Showers who recommends a new G-tube placement as the track is too new,  there is a possibility that replacement attempt would cause more damage to her abdominal wall then a new G-tube placement. After thorough discussion and shared decision making with her family member, patient decided to proceed with the procedure.  Patient was brought to [facility] via [facility]. N.p.o. since midnight Vital sign shows hypertension at baseline, O2 sat has been running between 93 to 95% on room air, currently 93% on room air.  Patient has history of smoking.- discussed with Dr. Grace Isaac, okay to proceed. INR on 09/22/2020 1.0 Last Plavix and ASA given on 09/28/2020 Acute Heparin last given on 10/03/2020 at 2112 hrs  Risks and benefits image guided gastrostomy tube placement was discussed with the patient including, but not limited to the need  for a barium enema during the procedure, bleeding, infection, peritonitis and/or damage to adjacent structures.  All of the patient's questions were answered, patient is agreeable to proceed.  Consent signed and in chart.    Thank you for this interesting consult.  I greatly enjoyed meeting Maanvi Lecompte Tirpak and look forward to participating in their care.  A copy of this report was sent to the requesting provider on this date.  Electronically Signed: Willette Brace, PA-C 10/04/2020, 9:40 AM   I spent a total of    25 Minutes in face to face in clinical consultation, greater than 50% of which was counseling/coordinating care for G-tube placement

## 2020-10-04 NOTE — Sedation Documentation (Signed)
[facility] at the bedside to transport patient back to [facility]. Report given to [facility] RN

## 2020-10-05 ENCOUNTER — Inpatient Hospital Stay (HOSPITAL_COMMUNITY): Payer: Medicare Other

## 2020-10-05 LAB — BASIC METABOLIC PANEL
Anion gap: 5 (ref 5–15)
BUN: 12 mg/dL (ref 8–23)
CO2: 22 mmol/L (ref 22–32)
Calcium: 7.2 mg/dL — ABNORMAL LOW (ref 8.9–10.3)
Chloride: 111 mmol/L (ref 98–111)
Creatinine, Ser: 0.64 mg/dL (ref 0.44–1.00)
GFR, Estimated: >60 mL/min (ref >60)
Glucose, Bld: 211 mg/dL — ABNORMAL HIGH (ref 70–99)
Potassium: 4.5 mmol/L (ref 3.5–5.1)
Sodium: 138 mmol/L (ref 135–145)

## 2020-10-05 LAB — PROCALCITONIN: Procalcitonin: 2.27 ng/mL

## 2020-10-05 LAB — CBC
HCT: 30.2 % — ABNORMAL LOW (ref 36.0–46.0)
Hemoglobin: 9.8 g/dL — ABNORMAL LOW (ref 12.0–15.0)
MCH: 31.2 pg (ref 26.0–34.0)
MCHC: 32.5 g/dL (ref 30.0–36.0)
MCV: 96.2 fL (ref 80.0–100.0)
Platelets: 334 10*3/uL (ref 150–400)
RBC: 3.14 MIL/uL — ABNORMAL LOW (ref 3.87–5.11)
RDW: 14.4 % (ref 11.5–15.5)
WBC: 11.2 10*3/uL — ABNORMAL HIGH (ref 4.0–10.5)
nRBC: 0 % (ref 0.0–0.2)

## 2020-10-05 LAB — GLUCOSE, CAPILLARY: Glucose-Capillary: 134 mg/dL — ABNORMAL HIGH (ref 70–99)

## 2020-10-05 LAB — URINALYSIS, ROUTINE W REFLEX MICROSCOPIC
Bilirubin Urine: NEGATIVE
Glucose, UA: NEGATIVE mg/dL
Ketones, ur: NEGATIVE mg/dL
Nitrite: NEGATIVE
Protein, ur: NEGATIVE mg/dL
Specific Gravity, Urine: 1.017 (ref 1.005–1.030)
WBC, UA: >50 WBC/hpf — ABNORMAL HIGH (ref 0–5)
pH: 5 (ref 5.0–8.0)

## 2020-10-05 LAB — MAGNESIUM: Magnesium: 1.7 mg/dL (ref 1.7–2.4)

## 2020-10-05 NOTE — Progress Notes (Signed)
PROGRESS NOTE    Kendra Lee  ZOX:[id number] DOB: Apr 17, 1955 DOA: 09/17/2020 PCP: Pcp, No   Brief Narrative:   65 y.o. female, with no known medical history, who presents to the ED with altered mental status. History is quite limited 2/2 patient's acute encephalopathy. Earlier, family was able to provide some history to the ED provider. They reported that the patient's last known normal was 6/24. She has been somnolent, and not taking care of herself in that interval. Family was not aware of an EtOH or illicit drug use. They are not aware of an trauma, and there are not signs of trauma on exam. Family reported no infectious symptoms or fevers.  Work up revealed patient suffered bilateral thalamic infarcts that have contributed to her encephalopathy.  After a goals of care discussion, patient's son wanted full scope of care.  Patient failed swallow eval and PEG tube was placed 09/22/20 and enteral feeding was started and titrated to goal rate without difficulty.  ED: Vitals stable, encephalopathic, CT head shows focal regions of hypoattenuation seen in the genu of the right internal capsule, posterior limb of the left internal capsule and right thalamus which could reflect age-indeterminate infarction.  No other acute abnormality  Assessment & Plan:   Active Problems:   Acute metabolic encephalopathy   CVA (cerebral vascular accident) (HCC)   Leukocytosis   Essential hypertension   Vitamin D deficiency   Dental caries   Dysphagia   Hypokalemia   Devastating Acute ischemic stroke/Acute metabolic Encephalopathy -Dr. Arbutus Leas discussed with neurology, Dr. Eligah East most likely due to stroke -encephalopathy seems to be resolved at this time. -MR brain--scattered bilateral infarcts of thalamus -carotid US--R-ICA--50-69%; L-ICA <50% -MRA brain--no LVO -Echo (TTE)--EF 60-65%, no WMA, no PFO -A1C--5.5 -LDL--138 -continue ASA and plavix -discussed with cardiology--not a good  candidate for TEE nor loop recorder -TSH--1.927 -ammonia 15>>26 -B12--262 -folate 4.8 -UA--recheck ordered 7/10--->negative  SIRS criteria with possible sepsis -Procalcitonin noted to be elevated on 7/13 -She was started on vancomycin and Zosyn empirically, downgraded to Zosyn only -Plan to follow blood cultures -No further febrile episodes noted -Chest x-ray with no significant findings -Urine analysis pending   Hyperlipidemia -continue statin when able to take p.o. or have PEG re-placed    Severe Dysphagia with high aspiration risk -speech eval-->not safe for po intake -IR placed PEG 09/22/20 -started enteral feeding 7/2 -tolerated Osmolite 1.2 @ 40 cc/hr -Free Water 200 cc every 8 hours -PT REMOVED PEG ON 7/8 -IR has replaced PEG tube 7/13 -Plan will be to resume tube feeding on 7/14   Hypertension - uncontrolled -allowed permissive hypertension initially -since PEG was removed by patient we cannot give oral BP meds -I have ordered IV BP meds until patient can have a new PEG placed. -added clonidine patch 7/10 -increased hydralazine to 15 mg every 4 hours, increased metoprolol to 7 mg every 6 hours. - increased clonidine patch dose to 0.2 mg on 7/12 while she is still NPO and has no PEG tube in place     Hypokalemia -IV replacement ordered 7/10, additional IV replacement 7/11, 7/12, 7/13 -recheck BMP in AM.    Tobacco abuse -she was smoking up to 2 ppd prior to admssion -cessation discussed   Generalized weakness and debilitation - B12 is WNL - check 25-OH vitamin D --->12.29.  ADD VIT D SUPP WHEN PEG IN PLACE   Severe Vitamin D deficiency - Add Vitamin D supplementation when able.      DVT prophylaxis:Heparin Code  Status: Full Family Communication: None at bedside Disposition Plan:  Status is: Inpatient   Remains inpatient appropriate because:IV treatments appropriate due to intensity of illness or inability to take PO and Inpatient level of care  appropriate due to severity of illness   Dispo: The patient is from: Home              Anticipated d/c is to: SNF              Patient currently is not medically stable to d/c.              Difficult to place patient No     Consultants:  IR   Procedures:  PEG placement 7/13  Antimicrobials:  Anti-infectives (From admission, onward)    Start     Dose/Rate Route Frequency Ordered Stop   10/05/20 1900  vancomycin (VANCOREADY) IVPB 750 mg/150 mL  Status:  Discontinued       See Hyperspace for full Linked Orders Report.   750 mg 150 mL/hr over 60 Minutes Intravenous Every 24 hours 10/04/20 1809 10/05/20 0743   10/04/20 1900  vancomycin (VANCOCIN) IVPB 1000 mg/200 mL premix       See Hyperspace for full Linked Orders Report.   1,000 mg 200 mL/hr over 60 Minutes Intravenous  Once 10/04/20 1809 10/04/20 2046   10/04/20 1815  piperacillin-tazobactam (ZOSYN) IVPB 3.375 g        3.375 g 12.5 mL/hr over 240 Minutes Intravenous Every 8 hours 10/04/20 1804     10/04/20 1014  ceFAZolin (ANCEF) IVPB 2g/100 mL premix        over 30 Minutes Intravenous Continuous PRN 10/04/20 1019 10/04/20 1014   10/04/20 1011  ceFAZolin (ANCEF) 2-4 GM/100ML-% IVPB       Note to Pharmacy: Cristy Friedlanderusterman, Kyle   : cabinet override      10/04/20 1011 10/04/20 1037   09/22/20 1001  ceFAZolin (ANCEF) 2-4 GM/100ML-% IVPB       Note to Pharmacy: Guido SanderNikolich, Gregory   : cabinet override      09/22/20 1001 09/22/20 2214   09/22/20 0000  ceFAZolin (ANCEF) IVPB 2g/100 mL premix        2 g 200 mL/hr over 30 Minutes Intravenous To Radiology 09/21/20 1256 09/22/20 1040       Subjective: Patient seen and evaluated today and is awake and alert, but poorly responsive to questioning.  No acute overnight events noted.  She was noted to have a fever as well as some tachypnea and tachycardia that developed yesterday evening, but has resolved overnight.  She was empirically started on IV antibiotics.  Objective: Vitals:    10/04/20 2037 10/04/20 2134 10/04/20 2304 10/05/20 0449  BP: (!) 105/55 122/65 115/65 139/70  Pulse: 92 84 80 80  Resp: (!) 24 (!) 24 20 19   Temp: 99.2 F (37.3 C) 99.4 F (37.4 C) 99 F (37.2 C) 97.6 F (36.4 C)  TempSrc: Oral Oral Oral   SpO2: 91% 93% 96% 97%  Weight:      Height:        Intake/Output Summary (Last 24 hours) at 10/05/2020 1224 Last data filed at 10/05/2020 1000 Gross per 24 hour  Intake 166.42 ml  Output 200 ml  Net -33.58 ml   Filed Weights   09/18/20 1600  Weight: 42.1 kg    Examination:  General exam: Appears calm and comfortable, unresponsive to questioning Respiratory system: Clear to auscultation. Respiratory effort normal. Cardiovascular system: S1 & S2 heard, RRR.  Gastrointestinal system: Abdomen is soft Central nervous system: Alert and awake, PEG tube present, clean, dry, and intact Extremities: No edema Skin: No significant lesions noted Psychiatry: Flat affect.    Data Reviewed: I have personally reviewed following labs and imaging studies  CBC: Recent Labs  Lab 10/01/20 0425 10/02/20 0620 10/05/20 0544  WBC 13.1* 9.8 11.2*  NEUTROABS 11.6* 8.4*  --   HGB 11.4* 11.8* 9.8*  HCT 34.5* 35.2* 30.2*  MCV 95.3 94.4 96.2  PLT 360 395 334   Basic Metabolic Panel: Recent Labs  Lab 10/01/20 0425 10/02/20 0620 10/03/20 0330 10/04/20 0410 10/05/20 0544  NA 140 145 145 143 138  K 2.9* 3.0* 2.9* 2.9* 4.5  CL 107 112* 113* 112* 111  CO2 GLUCOSE 112* 119* 109* 113* 211*  BUN 36* 24* CREATININE 0.61 0.59 0.61 0.59 0.64  CALCIUM 8.6* 8.7* 8.5* 8.0* 7.2*  MG 2.4 2.3  --  1.9 1.7   GFR: Estimated Creatinine Clearance: 46 mL/min (by C-G formula based on SCr of 0.64 mg/dL). Liver Function Tests: No results for input(s): AST, ALT, ALKPHOS, BILITOT, PROT, ALBUMIN in the last 168 hours. No results for input(s): LIPASE, AMYLASE in the last 168 hours. No results for input(s): AMMONIA in the last 168  hours. Coagulation Profile: No results for input(s): INR, PROTIME in the last 168 hours. Cardiac Enzymes: No results for input(s): CKTOTAL, CKMB, CKMBINDEX, TROPONINI in the last 168 hours. BNP (last 3 results) No results for input(s): PROBNP in the last 8760 hours. HbA1C: No results for input(s): HGBA1C in the last 72 hours. CBG: Recent Labs  Lab 10/04/20 1149  GLUCAP 134*   Lipid Profile: No results for input(s): CHOL, HDL, LDLCALC, TRIG, CHOLHDL, LDLDIRECT in the last 72 hours. Thyroid Function Tests: No results for input(s): TSH, T4TOTAL, FREET4, T3FREE, THYROIDAB in the last 72 hours. Anemia Panel: No results for input(s): VITAMINB12, FOLATE, FERRITIN, TIBC, IRON, RETICCTPCT in the last 72 hours. Sepsis Labs: Recent Labs  Lab 10/04/20 1808 10/04/20 2055 10/05/20 0544  PROCALCITON 2.34  --  2.27  LATICACIDVEN 0.7 0.6  --     Recent Results (from the past 240 hour(s))  Culture, blood (Routine X 2) w Reflex to ID Panel     Status: None (Preliminary result)   Collection Time: 10/03/20  2:09 PM   Specimen: BLOOD RIGHT HAND  Result Value Ref Range Status   Specimen Description   Final    BLOOD RIGHT HAND BOTTLES DRAWN AEROBIC AND ANAEROBIC   Special Requests Blood Culture adequate volume  Final   Culture   Final    NO GROWTH 2 DAYS Performed at [facility], [address] [place], [place] [address]    Report Status PENDING  Incomplete  Culture, blood (Routine X 2) w Reflex to ID Panel     Status: None (Preliminary result)   Collection Time: 10/03/20  4:07 PM   Specimen: BLOOD  Result Value Ref Range Status   Specimen Description BLOOD  Final   Special Requests NONE  Final   Culture   Final    NO GROWTH 2 DAYS Performed at [facility], [address], [place], [place] [address]    Report Status PENDING  Incomplete  MRSA Next Gen by PCR, Nasal     Status: None   Collection Time: 10/04/20  5:42 PM   Specimen: Nasal Mucosa; Nasal Swab  Result Value Ref Range  Status   MRSA  by PCR Next Gen NOT DETECTED NOT DETECTED Final    Comment: (NOTE) The GeneXpert MRSA Assay (FDA approved for NASAL specimens only), is one component of a comprehensive MRSA colonization surveillance program. It is not intended to diagnose MRSA infection nor to guide or monitor treatment for MRSA infections. Test performance is not FDA approved in patients less than 25 years old. Performed at [facility], [address], [place], [place] [address]   Culture, blood (routine x 2)     Status: None (Preliminary result)   Collection Time: 10/04/20  6:08 PM   Specimen: BLOOD RIGHT HAND  Result Value Ref Range Status   Specimen Description BLOOD RIGHT HAND  Final   Special Requests   Final    BOTTLES DRAWN AEROBIC AND ANAEROBIC Blood Culture adequate volume   Culture   Final    NO GROWTH < 12 HOURS Performed at [facility], [address], [place], [place] [address]    Report Status PENDING  Incomplete  Culture, blood (routine x 2)     Status: None (Preliminary result)   Collection Time: 10/04/20  6:08 PM   Specimen: BLOOD LEFT HAND  Result Value Ref Range Status   Specimen Description BLOOD LEFT HAND  Final   Special Requests   Final    BOTTLES DRAWN AEROBIC AND ANAEROBIC Blood Culture adequate volume   Culture   Final    NO GROWTH < 12 HOURS Performed at [facility], [address] [place], [place] [address]    Report Status PENDING  Incomplete         Radiology Studies: DG Chest 1 View  Result Date: 10/04/2020 CLINICAL DATA:  Fever and lethargy EXAM: CHEST  1 VIEW COMPARISON:  10/01/2020 FINDINGS: Cardiac shadow is within normal limits. Aortic calcifications are again seen. Lungs are well aerated bilaterally without focal infiltrate or sizable effusion. Chronic interstitial markings are seen. No bony abnormality is noted. IMPRESSION: No acute abnormality seen.  No change from the prior exam. Electronically Signed   By: Alcide Clever M.D.   On: 10/04/2020 19:27    DG Abd 1 View  Result Date: 10/05/2020 CLINICAL DATA:  Abdominal pain. Modified barium swallow 2 days prior. EXAM: ABDOMEN - 1 VIEW COMPARISON:  None. FINDINGS: Retained barium contrast throughout the colon. No dilated small bowel loops. Percutaneous gastrostomy tube terminates over the expected location of body of the stomach in the medial left upper quadrant. No evidence of pneumatosis or pneumoperitoneum. IMPRESSION: Nonobstructive bowel gas pattern. Retained barium contrast throughout the colon. Electronically Signed   By: Delbert Phenix M.D.   On: 10/05/2020 12:06   IR GASTROSTOMY TUBE MOD SED  Result Date: 10/04/2020 INDICATION: Altered mental status and dysphagia. Request made for gastrostomy tube placement for enteric nutrition supplementation purposes Note, patient underwent placement of a push gastrostomy tube on 09/22/2020 by Dr. Lowella Dandy however the tube was inadvertently removed After prolonged conversation with the patient's family regarding goals of care decision was made to proceed with repeat definitive gastrostomy tube placement. EXAM: PULL TROUGH GASTROSTOMY TUBE PLACEMENT COMPARISON:  Image guided placement of a post gastrostomy tube-09/22/2020 CT abdomen and pelvis-09/21/2020 MEDICATIONS: Patient is admitted to the hospital and receiving intravenous antibiotics.; Antibiotics were administered within 1 hour of the procedure. Glucagon 1 mg IV CONTRAST:  20 mL of Omnipaque 300 administered into the gastric lumen. ANESTHESIA/SEDATION: Moderate (conscious) sedation was employed during this procedure. A total of Versed 1.5 mg and Fentanyl 50 mcg was administered intravenously. Moderate Sedation Time:  11 minutes. The patient's level of consciousness and vital signs were monitored continuously by radiology nursing throughout the procedure under my direct supervision. FLUOROSCOPY TIME:  4 minutes, 6 seconds (70 mGy) COMPLICATIONS: None immediate. PROCEDURE: Informed written consent was obtained from  the patient's family following explanation of the procedure, risks, benefits and alternatives. A time out was performed prior to the initiation of the procedure. On physical inspection, it was clear that the prior gastrostomy tube was completely healed and not amenable to attempted percutaneous recanalization. As such the decision was made to proceed with definitive placement of a new now pull-through gastrostomy tube (previously, a push gastrostomy tube was placed secondary to patient's inability to cooperate with the procedure and open her mouth). Ultrasound scanning was performed to demarcate the edge of the left lobe of the liver. Maximal barrier sterile technique utilized including caps, mask, sterile gowns, sterile gloves, large sterile drape, hand hygiene and Betadine prep. The left upper quadrant was sterilely prepped and draped. An oral gastric catheter was inserted into the stomach under fluoroscopy. The existing nasogastric feeding tube was removed. The left costal margin and air opacified transverse colon were identified and avoided. Air was injected into the stomach for insufflation and visualization under fluoroscopy. Under sterile conditions a 17 gauge trocar needle was utilized to access the stomach percutaneously beneath the left subcostal margin after the overlying soft tissues were anesthetized with 1% Lidocaine with epinephrine. Needle position was confirmed within the stomach with aspiration of air and injection of small amount of contrast. A single T tack was deployed for gastropexy. Over an Amplatz guide wire, a 9-French sheath was inserted into the stomach. A snare device was utilized to capture the oral gastric catheter. The snare device was pulled retrograde from the stomach up the esophagus and out the oropharynx. The 20-French pull-through gastrostomy was connected to the snare device and pulled antegrade through the oropharynx down the esophagus into the stomach and then through the  percutaneous tract external to the patient. The gastrostomy was assembled externally. Contrast injection confirms appropriate positioning within the stomach. Several spot radiographic images were obtained in various obliquities for documentation. Dressings were applied. The patient tolerated procedure well without immediate post procedural complication. FINDINGS: After successful fluoroscopic guided placement, the gastrostomy tube is appropriately positioned with internal disc positioned against the inner ventral wall of the gastric lumen. IMPRESSION: Successful fluoroscopic insertion of a 20-French pull-through gastrostomy tube. The gastrostomy may be used immediately for medication administration and in 24 hrs for the initiation of feeds. Electronically Signed   By: Simonne Come M.D.   On: 10/04/2020 12:17   DG Swallowing Func-Speech Pathology  Result Date: 10/03/2020 Formatting of this result is different from the original. Objective Swallowing Evaluation: Type of Study: MBS-Modified Barium Swallow Study  Patient Details Name: Kendra Lee MRN: [id number] Date of Birth: 1955/08/22 Today's Date: 10/03/2020 Time: SLP Start Time (ACUTE ONLY): 1436 -SLP Stop Time (ACUTE ONLY): 1515 SLP Time Calculation (min) (ACUTE ONLY): 39 min Past Medical History: Past Medical History: Diagnosis Date  Smoker   2 ppd x 55 years Past Surgical History: Past Surgical History: Procedure Laterality Date  IR GASTROSTOMY TUBE MOD SED  09/22/2020 HPI: 65 y.o. female  with past medical history of no known medical history admitted on 09/17/2020 with acute metabolic encephalopathy, CVA acute embolic stroke. MRI shows Scattered small vessel infarcts in the bilateral thalamus,   bilateral internal capsule, and bilateral hemispheric white matter. Pt s/p PEG on Friday due to  severity of recent stroke with dysphagia.  Subjective: "squash" when asked what she grows in the garden Assessment / Plan / Recommendation CHL IP CLINICAL IMPRESSIONS  10/03/2020 Clinical Impression Pt presents with moderate/severe sensorimotor oropharyngeal dysphagia characterized by gross oral weakness and trace silent aspiration of thin liquids and NTL; swallowing function is negatively impacted by fluctuating alertness. Note anterior spillage with all liquids presented and prolonged AP transit and delayed swallowing trigger, triggered at the level of the valleculae. With thin liquids and NTL, note trace silent aspiration before and during the swallow; No reflexive cough and cued cough is VERY weak and not effective in clearing aspirates. With HTL note one episode of deep flash penetration to the cords. Pt consumed Puree textures (requires feeder and cues to open mouth wide enough to insert spoon for bolus), with prolonged oral prep, lingual pumping and a delayed swallowing trigger at the level of the vallecuale; laryngeal vestibule closure is good with solids and pharyngeal squeeze is adequate with trace residuals after the swallow. Note trials of each texture/consistency were very limited to 1-3 presentations of each secondary to fatigue. Recommend alternative means of nutrition and initiate dysphagia therapy and trials of D1/puree and tsp HTL with SLP only. Recommend repeat MBS when clinically appropriate. ST will continue to follow SLP Visit Diagnosis Dysphagia, unspecified (R13.10) Attention and concentration deficit following -- Frontal lobe and executive function deficit following -- Impact on safety and function Severe aspiration risk;Risk for inadequate nutrition/hydration   CHL IP TREATMENT RECOMMENDATION 10/03/2020 Treatment Recommendations Therapy as outlined in treatment plan below   Prognosis 10/03/2020 Prognosis for Safe Diet Advancement Guarded Barriers to Reach Goals Severity of deficits Barriers/Prognosis Comment -- CHL IP DIET RECOMMENDATION 10/03/2020 SLP Diet Recommendations NPO;Alternative means - long-term;Ice chips PRN after oral care Liquid Administration  via Spoon Medication Administration Via alternative means Compensations Slow rate;Small sips/bites Postural Changes Seated upright at 90 degrees   CHL IP OTHER RECOMMENDATIONS 10/03/2020 Recommended Consults -- Oral Care Recommendations Oral care BID Other Recommendations Have oral suction available   CHL IP FOLLOW UP RECOMMENDATIONS 10/03/2020 Follow up Recommendations Skilled Nursing facility   [facility] IP FREQUENCY AND DURATION 10/03/2020 Speech Therapy Frequency (ACUTE ONLY) min 3x week Treatment Duration 2 weeks      CHL IP ORAL PHASE 10/03/2020 Oral Phase Impaired Oral - Pudding Teaspoon -- Oral - Pudding Cup -- Oral - Honey Teaspoon NT Oral - Honey Cup Left anterior bolus loss;Right anterior bolus loss;Impaired mastication;Weak lingual manipulation;Reduced posterior propulsion;Holding of bolus;Lingual/palatal residue;Piecemeal swallowing;Delayed oral transit;Decreased bolus cohesion;Premature spillage Oral - Nectar Teaspoon Left anterior bolus loss;Right anterior bolus loss;Impaired mastication;Weak lingual manipulation;Reduced posterior propulsion;Holding of bolus;Lingual/palatal residue;Piecemeal swallowing;Delayed oral transit;Decreased bolus cohesion;Premature spillage Oral - Nectar Cup Left anterior bolus loss;Right anterior bolus loss;Impaired mastication;Weak lingual manipulation;Reduced posterior propulsion;Holding of bolus;Lingual/palatal residue;Piecemeal swallowing;Delayed oral transit;Decreased bolus cohesion;Premature spillage Oral - Nectar Straw NT Oral - Thin Teaspoon Left anterior bolus loss;Right anterior bolus loss;Impaired mastication;Weak lingual manipulation;Reduced posterior propulsion;Holding of bolus;Lingual/palatal residue;Piecemeal swallowing;Delayed oral transit;Decreased bolus cohesion;Premature spillage Oral - Thin Cup Left anterior bolus loss;Right anterior bolus loss;Impaired mastication;Weak lingual manipulation;Reduced posterior propulsion;Holding of bolus;Lingual/palatal  residue;Piecemeal swallowing;Delayed oral transit;Decreased bolus cohesion;Premature spillage Oral - Thin Straw NT Oral - Puree Weak lingual manipulation;Lingual pumping;Reduced posterior propulsion;Holding of bolus;Lingual/palatal residue;Piecemeal swallowing;Delayed oral transit;Decreased bolus cohesion Oral - Mech Soft NT Oral - Regular NT Oral - Multi-Consistency NT Oral - Pill NT Oral Phase - Comment --  CHL IP PHARYNGEAL PHASE 10/03/2020 Pharyngeal Phase Impaired Pharyngeal- Pudding Teaspoon -- Pharyngeal --  Pharyngeal- Pudding Cup -- Pharyngeal -- Pharyngeal- Honey Teaspoon Reduced airway/laryngeal closure;Penetration/Aspiration during swallow;Pharyngeal residue - valleculae;Pharyngeal residue - pyriform;Reduced epiglottic inversion Pharyngeal -- Pharyngeal- Honey Cup Reduced airway/laryngeal closure;Penetration/Aspiration during swallow;Pharyngeal residue - valleculae;Pharyngeal residue - pyriform;Reduced epiglottic inversion Pharyngeal -- Pharyngeal- Nectar Teaspoon NT Pharyngeal -- Pharyngeal- Nectar Cup Reduced airway/laryngeal closure;Penetration/Aspiration during swallow;Pharyngeal residue - valleculae;Pharyngeal residue - pyriform;Reduced epiglottic inversion;Trace aspiration Pharyngeal Material enters airway, passes BELOW cords without attempt by patient to eject out (silent aspiration) Pharyngeal- Nectar Straw NT Pharyngeal -- Pharyngeal- Thin Teaspoon Reduced airway/laryngeal closure;Penetration/Aspiration during swallow;Pharyngeal residue - valleculae;Pharyngeal residue - pyriform;Reduced epiglottic inversion;Trace aspiration Pharyngeal Material enters airway, passes BELOW cords without attempt by patient to eject out (silent aspiration) Pharyngeal- Thin Cup Reduced airway/laryngeal closure;Penetration/Aspiration during swallow;Pharyngeal residue - valleculae;Pharyngeal residue - pyriform;Reduced epiglottic inversion Pharyngeal Material enters airway, passes BELOW cords without attempt by patient to  eject out (silent aspiration) Pharyngeal- Thin Straw NT Pharyngeal -- Pharyngeal- Puree -- Pharyngeal -- Pharyngeal- Mechanical Soft -- Pharyngeal -- Pharyngeal- Regular -- Pharyngeal -- Pharyngeal- Multi-consistency -- Pharyngeal -- Pharyngeal- Pill -- Pharyngeal -- Pharyngeal Comment --  CHL IP CERVICAL ESOPHAGEAL PHASE 10/03/2020 Cervical Esophageal Phase WFL Pudding Teaspoon -- Pudding Cup -- Honey Teaspoon -- Honey Cup -- Nectar Teaspoon -- Nectar Cup -- Nectar Straw -- Thin Teaspoon -- Thin Cup -- Thin Straw -- Puree -- Mechanical Soft -- Regular -- Multi-consistency -- Pill -- Cervical Esophageal Comment -- Amelia [name], CCC-SLP Speech Language Pathologist [name] 10/03/2020, 3:51 PM                   Scheduled Meds:  amLODipine  5 mg Per Tube Daily   aspirin  150 mg Rectal Daily   atorvastatin  80 mg Per Tube Daily   [START ON 10/08/2020] cloNIDine  0.2 mg Transdermal Weekly   clopidogrel  75 mg Per Tube Daily   folic acid  1 mg Intravenous Daily   free water  200 mL Per Tube Q8H   heparin  5,000 Units Subcutaneous Q8H   hydrALAZINE  10 mg Oral Q8H   losartan  25 mg Per Tube Daily   mouth rinse  15 mL Mouth Rinse BID   pneumococcal 23 valent vaccine  0.5 mL Intramuscular Tomorrow-1000   scopolamine  1 patch Transdermal Q72H   thiamine  100 mg Per Tube Daily   Continuous Infusions:  feeding supplement (OSMOLITE 1.2 CAL) 1,000 mL (10/05/20 1136)   piperacillin-tazobactam (ZOSYN)  IV 3.375 g (10/05/20 0956)     LOS: 17 days    Time spent: 35 minutes    Shanikia Kernodle Hoover Brunette, DO [facility]  If 7PM-7AM, please contact night-coverage [email or web address] 10/05/2020, 12:24 PM

## 2020-10-05 NOTE — Progress Notes (Signed)
Physical Therapy Treatment Patient Details Name: Kendra Lee MRN: [id number] DOB: 03/01/1956 Today's Date: 10/05/2020    History of Present Illness Kendra Lee  is a 65 y.o. female, with no known medical history, who presents to the ED with altered mental status. History is quite limited 2/2 patient's non verbal status. Earlier, family was able to provide some history to the ED provider. They reported that the patient's last known normal was 6/24. She has been somnolent, and not taking care of herself in that interval. Family was not aware of an EtOH or illicit drug use. They are not aware of an trauma, and there are not signs of trauma on exam. Family reported no infectious symptoms or fevers.    PT Comments    Patient agreeable to participating in PT session today but indicated is very tired and abdomen is painful. Patient assisted with bed mobility and sit to stand transfer with RW. Patient stated she needed to have a BM. Patient assisted with short distance ambulation with RW from bedside to [facility]. Patient completed 5 sit to stand transfers with assistance and RW while nursing completed personal hygiene. Patient was then assisted with RW transfer and short distance ambulation back to bed. Patient required max assist for trunk and legs for sit to supine bed mobility. Patient was positioned in bed for comfort.  Patient would continue to benefit from skilled physical therapy in current environment and next venue to continue return to prior function and increase strength, endurance, balance, coordination, and functional mobility and gait skills.    Follow Up Recommendations  SNF     Equipment Recommendations  None recommended by PT    Recommendations for Other Services Speech consult     Precautions / Restrictions Precautions Precautions: Fall Restrictions Weight Bearing Restrictions: No    Mobility  Bed Mobility Overal bed mobility: Needs Assistance Bed Mobility: Supine to  Sit;Sit to Supine     Supine to sit: Mod assist Sit to supine: Max assist        Transfers   Equipment used: Rolling walker (2 wheeled) Transfers: Sit to/from [employer];Anterior-Posterior Transfer Sit to Stand: Mod assist Stand pivot transfers: Min assist;Mod assist   Anterior-Posterior transfers: Mod assist;Max assist      Ambulation/Gait   Gait Distance (Feet): 6 Feet (3 feet x2) Assistive device: Rolling walker (2 wheeled) Gait Pattern/deviations: Step-to pattern;Decreased stance time - right;Decreased step length - right;Decreased step length - left;Decreased stride length;Trunk flexed;Narrow base of support Gait velocity: decreased   General Gait Details: slow, labored cadence in forward bend position due to abdominal pain; amb with RW; limited to 5-6 steps along side of bed and stand pivot to/from [facility]; limited by pain and fatigue; on 2 LPM O2 through nasal cannula   Stairs             Wheelchair Mobility    Modified Rankin (Stroke Patients Only)       Balance Overall balance assessment: Needs assistance Sitting-balance support: Feet supported;Single extremity supported Sitting balance-Leahy Scale: Fair Sitting balance - Comments: fair seated at EOB   Standing balance support: During functional activity;Bilateral upper extremity supported Standing balance-Leahy Scale: Poor Standing balance comment: fair/poor using RW                            Cognition Arousal/Alertness: Awake/alert Behavior During Therapy: Flat affect Overall Cognitive Status: Within Functional Limits for tasks assessed  General Comments: Pt able to whisper and nod head to answer questions.      Exercises Other Exercises Other Exercises: sit to stand transfer x5    General Comments        Pertinent Vitals/Pain Pain Assessment: Faces Faces Pain Scale: Hurts whole lot Pain Location: abdomen Pain  Intervention(s): Limited activity within patient's tolerance;Monitored during session;Repositioned    Home Living                      Prior Function            PT Goals (current goals can now be found in the care plan section) Acute Rehab PT Goals Patient Stated Goal: return home PT Goal Formulation: With patient Time For Goal Achievement: 10/09/20 Potential to Achieve Goals: Good Progress towards PT goals: Not progressing toward goals - comment (patient POD 1 PEG tube placement)    Frequency    Min 3X/week      PT Plan         AM-PAC PT "6 Clicks" Mobility   Outcome Measure  Help needed turning from your back to your side while in a flat bed without using bedrails?: A Lot Help needed moving from lying on your back to sitting on the side of a flat bed without using bedrails?: A Lot Help needed moving to and from a bed to a chair (including a wheelchair)?: A Lot Help needed standing up from a chair using your arms (e.g., wheelchair or bedside chair)?: A Lot Help needed to walk in hospital room?: A Lot Help needed climbing 3-5 steps with a railing? : Total 6 Click Score: 11    End of Session Equipment Utilized During Treatment: Oxygen Activity Tolerance: Patient limited by fatigue;Patient limited by lethargy Patient left: in bed;with call bell/phone within reach;with bed alarm set Nurse Communication: Mobility status PT Visit Diagnosis: Unsteadiness on feet (R26.81);Other abnormalities of gait and mobility (R26.89);Muscle weakness (generalized) (M62.81);Other symptoms and signs involving the nervous system (R29.898)     Time: 1230-1300 PT Time Calculation (min) (ACUTE ONLY): 30 min  Charges:  $Therapeutic Activity: 8-22 mins                      Katina Dung. Hartnett-Rands, MS, PT [facility]  [phone]  Britta Mccreedy  Hartnett-Rands 10/05/2020, 1:18 PM

## 2020-10-06 LAB — BASIC METABOLIC PANEL
Anion gap: 6 (ref 5–15)
BUN: 13 mg/dL (ref 8–23)
CO2: 25 mmol/L (ref 22–32)
Calcium: 8.1 mg/dL — ABNORMAL LOW (ref 8.9–10.3)
Chloride: 106 mmol/L (ref 98–111)
Creatinine, Ser: 0.64 mg/dL (ref 0.44–1.00)
GFR, Estimated: >60 mL/min (ref >60)
Glucose, Bld: 121 mg/dL — ABNORMAL HIGH (ref 70–99)
Potassium: 3.1 mmol/L — ABNORMAL LOW (ref 3.5–5.1)
Sodium: 137 mmol/L (ref 135–145)

## 2020-10-06 LAB — GLUCOSE, CAPILLARY
Glucose-Capillary: 130 mg/dL — ABNORMAL HIGH (ref 70–99)
Glucose-Capillary: 139 mg/dL — ABNORMAL HIGH (ref 70–99)

## 2020-10-06 LAB — CBC
HCT: 29.4 % — ABNORMAL LOW (ref 36.0–46.0)
Hemoglobin: 9.7 g/dL — ABNORMAL LOW (ref 12.0–15.0)
MCH: 31.1 pg (ref 26.0–34.0)
MCHC: 33 g/dL (ref 30.0–36.0)
MCV: 94.2 fL (ref 80.0–100.0)
Platelets: 333 10*3/uL (ref 150–400)
RBC: 3.12 MIL/uL — ABNORMAL LOW (ref 3.87–5.11)
RDW: 14.3 % (ref 11.5–15.5)
WBC: 11.6 10*3/uL — ABNORMAL HIGH (ref 4.0–10.5)
nRBC: 0 % (ref 0.0–0.2)

## 2020-10-06 LAB — MAGNESIUM: Magnesium: 1.7 mg/dL (ref 1.7–2.4)

## 2020-10-06 LAB — PROCALCITONIN: Procalcitonin: 1.85 ng/mL

## 2020-10-06 MED ORDER — POTASSIUM CHLORIDE 20 MEQ PO PACK
40.0000 meq | PACK | Freq: Once | ORAL | Status: AC
  Administered 2020-10-06: 40 meq
  Filled 2020-10-06: qty 2

## 2020-10-06 NOTE — Care Management Important Message (Signed)
Important Message  Patient Details  Name: Kendra Lee MRN: [id number] Date of Birth: 1955/06/11   Medicare Important Message Given:  Yes (copy left  in room for family)     Corey Harold 10/06/2020, 4:34 PM

## 2020-10-06 NOTE — Progress Notes (Signed)
Chart check:  G tube appears well functioning, tube feeding goal rate reached w/o difficulty.  G tube site clean and dry.   IR singing off at this time, please call [facility] IR for questions and concerns.   Lynann Bologna Barbette Mcglaun PA-C 10/06/2020 2:57 PM

## 2020-10-06 NOTE — Progress Notes (Signed)
  Speech Language Pathology Treatment: Dysphagia  Patient Details Name: Kendra Lee MRN: [id number] DOB: 20-Oct-1955 Today's Date: 10/06/2020 Time: 0050-5678 SLP Time Calculation (min) (ACUTE ONLY): 35 min  Assessment / Plan / Recommendation Clinical Impression  Ongoing diagnostic dysphagia therapy provided following rigorous oral care provided via toothettes with oral rinse and oral suction to facilitate clearance. NT present and trained/educated on oral care and importance of oral care for this patient. Pt's oral cavity continues with poor condition. Note a small Piece of tooth fell out of Pt's mouth during oral care.   RN reports after PO trials provided 10/04/20 (see ST note for details) that Pt demonstrated low grade fever and required O2 ~ 1-2 hrs after trials. Pt continues to be very fatigued today and is minimally able to verbally respond; following commands to open mouth/stick out tongue are labored, slow and Pt is minimally able to follow. SLP provided one tsp of HTL and Pt demonstrated very poor containment of bolus, no labial seal, HTL seen spilling into both labial sulci, anterior spillage and despite maximal cues required about 1-2 mins to trigger a swallow. Trials were terminated secondary to severe lethargy/weakness and inability to manage bolus today.   ST will continue to follow. Thank you,   HPI HPI: 65 y.o. female  with past medical history of no known medical history admitted on 09/17/2020 with acute metabolic encephalopathy, CVA acute embolic stroke. MRI shows Scattered small vessel infarcts in the bilateral thalamus,   bilateral internal capsule, and bilateral hemispheric white matter. Pt s/p PEG on Friday due to severity of recent stroke with dysphagia.      SLP Plan  Continue with current plan of care       Recommendations  Diet recommendations: NPO Liquids provided via: Teaspoon Medication Administration: Via alternative means Compensations: Slow rate;Small  sips/bites                Oral Care Recommendations: Oral care QID;Oral care prior to ice chip/H20;Staff/trained caregiver to provide oral care Follow up Recommendations: Skilled Nursing facility SLP Visit Diagnosis: Dysphagia, unspecified (R13.10) Plan: Continue with current plan of care       Solana Coggin [name], CCC-SLP Speech Language Pathologist    [name] 10/06/2020, 9:56 AM

## 2020-10-06 NOTE — Progress Notes (Signed)
Nutrition Follow up  DOCUMENTATION CODES:   Underweight  INTERVENTION:  Osmolite 1.2 @ 55 ml/hr via PEG.   Tube feeding regimen provides 1584 kcal (100% of needs), 73 grams of protein, and 1082 ml of H2O.    Free water flushes 30 ml before and after meds and 200 ml -TID per tube.   Recommend consider adding daily stool softener  Requesting updated weight  NUTRITION DIAGNOSIS:   Inadequate oral intake related to inability to eat (recent stroke) as evidenced by NPO status. -addressed by enteral feeding  GOAL:  Patient will meet greater than or equal to 90% of their needs -MET  MONITOR:  Labs, Weight trends, Skin (plan of care regarding nutrition support)   ASSESSMENT: Patient is an underweight 65 yo female with acute encephalopathy, acute ischemic stroke.   6/29- Palliative Care addressed [place] with son.   ST evaluation today reviewed. Dysphagia-NPO for now. Risk for aspiration PNA. No family at bedside during RD visit. Talked with her nurse. If tube feeding becomes an option. Recommendations provided above. If expected to be short-term 6 weeks or less consider NGT placement.   7/1 PEG tube placed by IR  7/2 Enteral feeding initiated- Osmolite 1.2 (continuous) with goal rate 55 ml/hr.   7/5 Nursing at bedside during RD visit. She is tolerating tube feeds at goal, up in chair and more awake than during last visit. Disposition-short term rehab- planning in process. Palliative following. Continue current tube feeding regimen. Last BM????  7/7 talked with nursing this morning -pt tube feeding stopped due to abdominal pain. Last BM- small today and medium yesterday. No current complaints from patient. Will resume at lower rate.   7/8 Talked with nursing RN/NT. Patient tolerating tube feeds at current rate. Will advance to goal rate. Body weight updated today by NT- 44.7 kg.  Patient continues to be ready medically and waiting for SNF -insurance auth.   7/11 Patient removed PEG  tube after RD visit 7/8. IR at [facility] contacted to set up replacement early this week. She remains unsafe for oral intake -NPO. IV fluids provided -D5/NS @ 65 ml/hr currently.   7/12- MBSS completed. Continue NPO per ST. Trials of D1/ tsp HTL with SLP.  7/13 Patient had PEG replaced this morning IR -[facility]. Cleared for medications today via PEG and tube feeding after 24 hr. Recommend resume Osmolite 1.2 @ 40 ml/hr and free water as noted above is no IVF on board. Requested current body weight to reassess needs.   7/15 Patient working with SLP -dysphagia therapy. Continuing to recommend NPO at this time. Patient tolerating tube feeds. Recommend update weight in chart - most recent weight from 6/27. Suggest to consider stool softener.   Medications:  B-1, Lipitor.  Labs reviewed: BMP Latest Ref Rng & Units 10/06/2020 10/05/2020 10/04/2020  Glucose 70 - 99 mg/dL 121(H) 211(H) 113(H)  BUN 8 - 23 mg/dL _0 Creatinine 0.44 - 1.00 mg/dL 0.64 0.64 0.59  Sodium 135 - 145 mmol/L 137 138 143  Potassium 3.5 - 5.1 mmol/L 3.1(L) 4.5 2.9(L)  Chloride 98 - 111 mmol/L 106 111 112(H)  CO2 22 - 32 mmol/L _1 Calcium 8.9 - 10.3 mg/dL 8.1(L) 7.2(L) 8.0(L)     Diet Order:   Diet Order             Diet NPO time specified Except for: Sips with Meds  Diet effective midnight  EDUCATION NEEDS:   Not appropriate for education at this time  Skin:  Skin Assessment: Reviewed RN Assessment PEG-placed LUQ 7/13  Last BM:  7/14 type 1 small  Height:   Ht Readings from Last 1 Encounters:  09/18/20 _0  (1.549 m)    Weight:   Wt Readings from Last 1 Encounters:  09/18/20 42.1 kg    Ideal Body Weight:   48 kg  BMI:  Body mass index is 17.54 kg/m.  Estimated Nutritional Needs:   Kcal:  0233-4356  Protein:  65-72 gr  Fluid:  >1200 ml daily   Colman Cater MS,RD,CSG,LDN Contact: Shea Evans

## 2020-10-06 NOTE — TOC Progression Note (Addendum)
Transition of Care [facility]) - Progression Note    Patient Details  Name: Kendra Lee MRN: [id number] Date of Birth: 1955-10-14  Transition of Care [facility]) CM/SW Contact  Barry Brunner, LCSW Phone Number: 10/06/2020, 10:42 AM  Clinical Narrative:    CSW notified Debbie with [facility] of anticipated discharge of 10/06/2020. CSW also referred patient to [facility] for Palliative. CSW noted patient's SSN is not documented in chart. Patient's son reported that patient's SSN is [id number]. TOC to follow.   Expected Discharge Plan: Skilled Nursing Facility Barriers to Discharge: English as a second language teacher, Continued Medical Work up  Expected Discharge Plan and Services Expected Discharge Plan: Skilled Nursing Facility       Living arrangements for the past 2 months: Single Family Home Expected Discharge Date: 09/24/20                                     Social Determinants of Health (SDOH) Interventions    Readmission Risk Interventions Readmission Risk Prevention Plan 09/20/2020  Medication Screening Complete  Transportation Screening Complete

## 2020-10-06 NOTE — Progress Notes (Signed)
PROGRESS NOTE    Kendra PicklesJo Ann Lee  WUJ:[id number]:[id number] DOB: [birth year] DOA: 09/17/2020 PCP: Pcp, No   Brief Narrative:   65 y.o. female, with no known medical history, who presents to the ED with altered mental status. History is quite limited 2/2 patient's acute encephalopathy. Earlier, family was able to provide some history to the ED provider. They reported that the patient's last known normal was 6/24. She has been somnolent, and not taking care of herself in that interval. Family was not aware of an EtOH or illicit drug use. They are not aware of an trauma, and there are not signs of trauma on exam. Family reported no infectious symptoms or fevers.  Work up revealed patient suffered bilateral thalamic infarcts that have contributed to her encephalopathy.  After a goals of care discussion, patient's son wanted full scope of care.  Patient failed swallow eval and PEG tube was placed 09/22/20 and enteral feeding was started and titrated to goal rate without difficulty.  ED: Vitals stable, encephalopathic CT head shows focal regions of hypoattenuation seen in the genu of the right internal capsule, posterior limb of the left internal capsule and right thalamus which could reflect age-indeterminate infarction.  No other acute abnormality  Assessment & Plan:   Active Problems:   Acute metabolic encephalopathy   CVA (cerebral vascular accident) (HCC)   Leukocytosis   Essential hypertension   Vitamin D deficiency   Dental caries   Dysphagia   Hypokalemia   Devastating Acute ischemic stroke/Acute metabolic Encephalopathy -Dr. Arbutus Leasat discussed with neurology, Dr. Eligah [place]-->encephalopathy most likely due to stroke along with UTI -MR brain--scattered bilateral infarcts of thalamus -carotid US--R-ICA--50-69%; L-ICA <50% -MRA brain--no LVO -Echo (TTE)--EF 60-65%, no WMA, no PFO -A1C--5.5 -LDL--138 -continue ASA and plavix -discussed with cardiology--not a good candidate for TEE nor loop  recorder -TSH--1.927 -ammonia 15>>26 -B12--262 -folate 4.8 -UA--recheck ordered 7/10--->negative   Sepsis secondary to UTI -Procalcitonin noted to be elevated on 7/13 -She was started on vancomycin and Zosyn empirically, downgraded to Zosyn only -Plan to follow blood cultures -No further febrile episodes noted -Chest x-ray with no significant findings -Urine cultures currently pending   Hyperlipidemia -continue statin when able to take p.o. or have PEG re-placed    Severe Dysphagia with high aspiration risk -speech eval-->not safe for po intake -IR placed PEG 09/22/20 -started enteral feeding 7/2 -tolerated Osmolite 1.2 @ 40 cc/hr -Free Water 200 cc every 8 hours -PT REMOVED PEG ON 7/8 -IR has replaced PEG tube 7/13 -tube feeding resumed on 7/14   Hypertension - uncontrolled -allowed permissive hypertension initially -since PEG was removed by patient we cannot give oral BP meds -I have ordered IV BP meds until patient can have a new PEG placed. -added clonidine patch 7/10 -increased hydralazine to 15 mg every 4 hours, increased metoprolol to 7 mg every 6 hours. - increased clonidine patch dose to 0.2 mg on 7/12 while she is still NPO and has no PEG tube in place     Hypokalemia -IV replacement ordered 7/10, additional IV replacement 7/11, 7/12, 7/13 -recheck BMP in AM.    Tobacco abuse -she was smoking up to 2 ppd prior to admssion -cessation discussed   Generalized weakness and debilitation - B12 is WNL - check 25-OH vitamin D --->12.29.  ADD VIT D SUPP WHEN PEG IN PLACE   Severe Vitamin D deficiency - Add Vitamin D supplementation when able.      DVT prophylaxis:Heparin Code Status: Full Family Communication: Discussed with son on  phone 7/15 Disposition Plan:  Status is: Inpatient   Remains inpatient appropriate because:IV treatments appropriate due to intensity of illness or inability to take PO and Inpatient level of care appropriate due to severity of  illness   Dispo: The patient is from: Home              Anticipated d/c is to: SNF              Patient currently is not medically stable to d/c.              Difficult to place patient No     Consultants:  IR   Procedures:  PEG placement 7/13  Antimicrobials:  Anti-infectives (From admission, onward)    Start     Dose/Rate Route Frequency Ordered Stop   10/05/20 1900  vancomycin (VANCOREADY) IVPB 750 mg/150 mL  Status:  Discontinued       See Hyperspace for full Linked Orders Report.   750 mg 150 mL/hr over 60 Minutes Intravenous Every 24 hours 10/04/20 1809 10/05/20 0743   10/04/20 1900  vancomycin (VANCOCIN) IVPB 1000 mg/200 mL premix       See Hyperspace for full Linked Orders Report.   1,000 mg 200 mL/hr over 60 Minutes Intravenous  Once 10/04/20 1809 10/04/20 2046   10/04/20 1815  piperacillin-tazobactam (ZOSYN) IVPB 3.375 g        3.375 g 12.5 mL/hr over 240 Minutes Intravenous Every 8 hours 10/04/20 1804     10/04/20 1014  ceFAZolin (ANCEF) IVPB 2g/100 mL premix        over 30 Minutes Intravenous Continuous PRN 10/04/20 1019 10/04/20 1014   10/04/20 1011  ceFAZolin (ANCEF) 2-4 GM/100ML-% IVPB       Note to Pharmacy: Cristy Friedlander   : cabinet override      10/04/20 1011 10/04/20 1037   09/22/20 1001  ceFAZolin (ANCEF) 2-4 GM/100ML-% IVPB       Note to Pharmacy: Guido Sander   : cabinet override      09/22/20 1001 09/22/20 2214   09/22/20 0000  ceFAZolin (ANCEF) IVPB 2g/100 mL premix        2 g 200 mL/hr over 30 Minutes Intravenous To Radiology 09/21/20 1256 09/22/20 1040       Subjective: Patient seen and evaluated today and continues to have some incoherent mumbling.  No acute overnight events.  She appears to be tolerating her tube feeds.  Objective: Vitals:   10/05/20 1312 10/05/20 2115 10/05/20 2300 10/06/20 0514  BP: 116/66 139/64  (!) 152/68  Pulse: 86 80  85  Resp: 18 18  13   Temp: 97.8 F (36.6 C) 98.6 F (37 C)  98.7 F (37.1 C)   TempSrc: Oral Oral    SpO2: 96% 95% 93% 95%  Weight:      Height:        Intake/Output Summary (Last 24 hours) at 10/06/2020 1049 Last data filed at 10/06/2020 0514 Gross per 24 hour  Intake 160.28 ml  Output 775 ml  Net -614.72 ml   Filed Weights   09/18/20 1600  Weight: 42.1 kg    Examination:  General exam: Appears calm and comfortable, mumbles incoherently Respiratory system: Clear to auscultation. Respiratory effort normal. Cardiovascular system: S1 & S2 heard, RRR.  Gastrointestinal system: Abdomen is soft, PEG tube clean dry and intact with tube feedings ongoing Central nervous system: Alert and awake Extremities: No edema Skin: No significant lesions noted Psychiatry: Flat affect.    Data  Reviewed: I have personally reviewed following labs and imaging studies  CBC: Recent Labs  Lab 10/01/20 0425 10/02/20 0620 10/05/20 0544 10/06/20 0534  WBC 13.1* 9.8 11.2* 11.6*  NEUTROABS 11.6* 8.4*  --   --   HGB 11.4* 11.8* 9.8* 9.7*  HCT 34.5* 35.2* 30.2* 29.4*  MCV 95.3 94.4 96.2 94.2  PLT 360 395 334 333   Basic Metabolic Panel: Recent Labs  Lab 10/01/20 0425 10/02/20 0620 10/03/20 0330 10/04/20 0410 10/05/20 0544 10/06/20 0534  NA 140 145 145 143 138 137  K 2.9* 3.0* 2.9* 2.9* 4.5 3.1*  CL 107 112* 113* 112* 111 106  CO2 GLUCOSE 112* 119* 109* 113* 211* 121*  BUN 36* 24* CREATININE 0.61 0.59 0.61 0.59 0.64 0.64  CALCIUM 8.6* 8.7* 8.5* 8.0* 7.2* 8.1*  MG 2.4 2.3  --  1.9 1.7 1.7   GFR: Estimated Creatinine Clearance: 46 mL/min (by C-G formula based on SCr of 0.64 mg/dL). Liver Function Tests: No results for input(s): AST, ALT, ALKPHOS, BILITOT, PROT, ALBUMIN in the last 168 hours. No results for input(s): LIPASE, AMYLASE in the last 168 hours. No results for input(s): AMMONIA in the last 168 hours. Coagulation Profile: No results for input(s): INR, PROTIME in the last 168 hours. Cardiac Enzymes: No results for  input(s): CKTOTAL, CKMB, CKMBINDEX, TROPONINI in the last 168 hours. BNP (last 3 results) No results for input(s): PROBNP in the last 8760 hours. HbA1C: No results for input(s): HGBA1C in the last 72 hours. CBG: Recent Labs  Lab 10/04/20 1149 10/06/20 0745  GLUCAP 134* 130*   Lipid Profile: No results for input(s): CHOL, HDL, LDLCALC, TRIG, CHOLHDL, LDLDIRECT in the last 72 hours. Thyroid Function Tests: No results for input(s): TSH, T4TOTAL, FREET4, T3FREE, THYROIDAB in the last 72 hours. Anemia Panel: No results for input(s): VITAMINB12, FOLATE, FERRITIN, TIBC, IRON, RETICCTPCT in the last 72 hours. Sepsis Labs: Recent Labs  Lab 10/04/20 1808 10/04/20 2055 10/05/20 0544 10/06/20 0534  PROCALCITON 2.34  --  2.27 1.85  LATICACIDVEN 0.7 0.6  --   --     Recent Results (from the past 240 hour(s))  Culture, blood (Routine X 2) w Reflex to ID Panel     Status: None (Preliminary result)   Collection Time: 10/03/20  2:09 PM   Specimen: BLOOD RIGHT HAND  Result Value Ref Range Status   Specimen Description   Final    BLOOD RIGHT HAND BOTTLES DRAWN AEROBIC AND ANAEROBIC   Special Requests Blood Culture adequate volume  Final   Culture   Final    NO GROWTH 2 DAYS Performed at [facility], [address], [place], [place] [address]    Report Status PENDING  Incomplete  Culture, blood (Routine X 2) w Reflex to ID Panel     Status: None (Preliminary result)   Collection Time: 10/03/20  4:07 PM   Specimen: BLOOD  Result Value Ref Range Status   Specimen Description BLOOD  Final   Special Requests NONE  Final   Culture   Final    NO GROWTH 2 DAYS Performed at [facility], [address] [place], [place] [address]    Report Status PENDING  Incomplete  MRSA Next Gen by PCR, Nasal     Status: None   Collection Time: 10/04/20  5:42 PM   Specimen: Nasal Mucosa; Nasal Swab  Result Value Ref Range Status   MRSA by PCR Next Gen  NOT DETECTED NOT DETECTED Final    Comment:  (NOTE) The GeneXpert MRSA Assay (FDA approved for NASAL specimens only), is one component of a comprehensive MRSA colonization surveillance program. It is not intended to diagnose MRSA infection nor to guide or monitor treatment for MRSA infections. Test performance is not FDA approved in patients less than 43 years old. Performed at [facility], [address] [place], [place] [address]   Culture, blood (routine x 2)     Status: None (Preliminary result)   Collection Time: 10/04/20  6:08 PM   Specimen: BLOOD RIGHT HAND  Result Value Ref Range Status   Specimen Description BLOOD RIGHT HAND  Final   Special Requests   Final    BOTTLES DRAWN AEROBIC AND ANAEROBIC Blood Culture adequate volume   Culture   Final    NO GROWTH < 12 HOURS Performed at [facility], [address], [place], [place] [address]    Report Status PENDING  Incomplete  Culture, blood (routine x 2)     Status: None (Preliminary result)   Collection Time: 10/04/20  6:08 PM   Specimen: BLOOD LEFT HAND  Result Value Ref Range Status   Specimen Description BLOOD LEFT HAND  Final   Special Requests   Final    BOTTLES DRAWN AEROBIC AND ANAEROBIC Blood Culture adequate volume   Culture   Final    NO GROWTH < 12 HOURS Performed at [facility], [address], [place], [place] [address]    Report Status PENDING  Incomplete         Radiology Studies: DG Chest 1 View  Result Date: 10/04/2020 CLINICAL DATA:  Fever and lethargy EXAM: CHEST  1 VIEW COMPARISON:  10/01/2020 FINDINGS: Cardiac shadow is within normal limits. Aortic calcifications are again seen. Lungs are well aerated bilaterally without focal infiltrate or sizable effusion. Chronic interstitial markings are seen. No bony abnormality is noted. IMPRESSION: No acute abnormality seen.  No change from the prior exam. Electronically Signed   By: Alcide Clever M.D.   On: 10/04/2020 19:27   DG Abd 1 View  Result Date: 10/05/2020 CLINICAL DATA:  Abdominal pain.  Modified barium swallow 2 days prior. EXAM: ABDOMEN - 1 VIEW COMPARISON:  None. FINDINGS: Retained barium contrast throughout the colon. No dilated small bowel loops. Percutaneous gastrostomy tube terminates over the expected location of body of the stomach in the medial left upper quadrant. No evidence of pneumatosis or pneumoperitoneum. IMPRESSION: Nonobstructive bowel gas pattern. Retained barium contrast throughout the colon. Electronically Signed   By: Delbert Phenix M.D.   On: 10/05/2020 12:06   IR GASTROSTOMY TUBE MOD SED  Result Date: 10/04/2020 INDICATION: Altered mental status and dysphagia. Request made for gastrostomy tube placement for enteric nutrition supplementation purposes Note, patient underwent placement of a push gastrostomy tube on 09/22/2020 by Dr. Lowella Dandy however the tube was inadvertently removed After prolonged conversation with the patient's family regarding goals of care decision was made to proceed with repeat definitive gastrostomy tube placement. EXAM: PULL TROUGH GASTROSTOMY TUBE PLACEMENT COMPARISON:  Image guided placement of a post gastrostomy tube-09/22/2020 CT abdomen and pelvis-09/21/2020 MEDICATIONS: Patient is admitted to the hospital and receiving intravenous antibiotics.; Antibiotics were administered within 1 hour of the procedure. Glucagon 1 mg IV CONTRAST:  20 mL of Omnipaque 300 administered into the gastric lumen. ANESTHESIA/SEDATION: Moderate (conscious) sedation was employed during this procedure. A total of Versed 1.5 mg and Fentanyl 50 mcg was administered intravenously. Moderate Sedation Time: 11 minutes. The  patient's level of consciousness and vital signs were monitored continuously by radiology nursing throughout the procedure under my direct supervision. FLUOROSCOPY TIME:  4 minutes, 6 seconds (70 mGy) COMPLICATIONS: None immediate. PROCEDURE: Informed written consent was obtained from the patient's family following explanation of the procedure, risks, benefits  and alternatives. A time out was performed prior to the initiation of the procedure. On physical inspection, it was clear that the prior gastrostomy tube was completely healed and not amenable to attempted percutaneous recanalization. As such the decision was made to proceed with definitive placement of a new now pull-through gastrostomy tube (previously, a push gastrostomy tube was placed secondary to patient's inability to cooperate with the procedure and open her mouth). Ultrasound scanning was performed to demarcate the edge of the left lobe of the liver. Maximal barrier sterile technique utilized including caps, mask, sterile gowns, sterile gloves, large sterile drape, hand hygiene and Betadine prep. The left upper quadrant was sterilely prepped and draped. An oral gastric catheter was inserted into the stomach under fluoroscopy. The existing nasogastric feeding tube was removed. The left costal margin and air opacified transverse colon were identified and avoided. Air was injected into the stomach for insufflation and visualization under fluoroscopy. Under sterile conditions a 17 gauge trocar needle was utilized to access the stomach percutaneously beneath the left subcostal margin after the overlying soft tissues were anesthetized with 1% Lidocaine with epinephrine. Needle position was confirmed within the stomach with aspiration of air and injection of small amount of contrast. A single T tack was deployed for gastropexy. Over an Amplatz guide wire, a 9-French sheath was inserted into the stomach. A snare device was utilized to capture the oral gastric catheter. The snare device was pulled retrograde from the stomach up the esophagus and out the oropharynx. The 20-French pull-through gastrostomy was connected to the snare device and pulled antegrade through the oropharynx down the esophagus into the stomach and then through the percutaneous tract external to the patient. The gastrostomy was assembled  externally. Contrast injection confirms appropriate positioning within the stomach. Several spot radiographic images were obtained in various obliquities for documentation. Dressings were applied. The patient tolerated procedure well without immediate post procedural complication. FINDINGS: After successful fluoroscopic guided placement, the gastrostomy tube is appropriately positioned with internal disc positioned against the inner ventral wall of the gastric lumen. IMPRESSION: Successful fluoroscopic insertion of a 20-French pull-through gastrostomy tube. The gastrostomy may be used immediately for medication administration and in 24 hrs for the initiation of feeds. Electronically Signed   By: Simonne Come M.D.   On: 10/04/2020 12:17        Scheduled Meds:  amLODipine  5 mg Per Tube Daily   aspirin  150 mg Rectal Daily   atorvastatin  80 mg Per Tube Daily   [START ON 10/08/2020] cloNIDine  0.2 mg Transdermal Weekly   clopidogrel  75 mg Per Tube Daily   folic acid  1 mg Intravenous Daily   free water  200 mL Per Tube Q8H   heparin  5,000 Units Subcutaneous Q8H   hydrALAZINE  10 mg Oral Q8H   losartan  25 mg Per Tube Daily   mouth rinse  15 mL Mouth Rinse BID   pneumococcal 23 valent vaccine  0.5 mL Intramuscular Tomorrow-1000   scopolamine  1 patch Transdermal Q72H   thiamine  100 mg Per Tube Daily   Continuous Infusions:  feeding supplement (OSMOLITE 1.2 CAL) 1,000 mL (10/06/20 1000)   piperacillin-tazobactam (ZOSYN)  IV 3.375 g (10/06/20 0949)     LOS: 18 days    Time spent: 35 minutes    Emmer Lillibridge D Sherryll Burger, DO [facility]  If 7PM-7AM, please contact night-coverage [email or web address] 10/06/2020, 10:49 AM

## 2020-10-06 NOTE — Progress Notes (Signed)
Physical Therapy Treatment Patient Details Name: Kendra Lee MRN: [id number] DOB: 07/26/1955 Today's Date: 10/06/2020    History of Present Illness Kendra Lee  is a 65 y.o. female, with no known medical history, who presents to the ED with altered mental status. History is quite limited 2/2 patient's non verbal status. Earlier, family was able to provide some history to the ED provider. They reported that the patient's last known normal was 6/24. She has been somnolent, and not taking care of herself in that interval. Family was not aware of an EtOH or illicit drug use. They are not aware of an trauma, and there are not signs of trauma on exam. Family reported no infectious symptoms or fevers.    PT Comments    Patient requires mod assist to pull to seated and transition to EOB secondary to weakness. She demonstrates fair/poor sitting balance requiring support to remain seated. Patient completes seated exercises following cueing and demonstration but fatigues quickly. Patient requests to return to supine due to fatigue and impaired activity tolerance. Patient will benefit from continued physical therapy in hospital and recommended venue below to increase strength, balance, endurance for safe ADLs and gait.    Follow Up Recommendations  SNF     Equipment Recommendations  None recommended by PT    Recommendations for Other Services Speech consult     Precautions / Restrictions Precautions Precautions: Fall Restrictions Weight Bearing Restrictions: No    Mobility  Bed Mobility Overal bed mobility: Needs Assistance Bed Mobility: Supine to Sit;Sit to Supine     Supine to sit: Mod assist Sit to supine: Mod assist   General bed mobility comments: increased time, labored movement, requires assist to pull to seated EOB    Transfers                    Ambulation/Gait                 Stairs             Wheelchair Mobility    Modified Rankin  (Stroke Patients Only)       Balance Overall balance assessment: Needs assistance Sitting-balance support: Feet supported;Single extremity supported Sitting balance-Leahy Scale: Fair Sitting balance - Comments: fair/poor seated at EOB                                    Cognition Arousal/Alertness: Awake/alert Behavior During Therapy: Flat affect Overall Cognitive Status: Within Functional Limits for tasks assessed                                 General Comments: occasional nod and mumble to answer question      Exercises General Exercises - Upper Extremity Shoulder Flexion: AROM;Left;10 reps;Seated General Exercises - Lower Extremity Long Arc Quad: Seated;AROM;Strengthening;Both;10 reps Hip Flexion/Marching: Seated;AROM;Strengthening;Both;10 reps Toe Raises: Seated;AROM;Strengthening;Both;10 reps Heel Raises: Seated;AROM;Strengthening;Both;10 reps    General Comments        Pertinent Vitals/Pain Pain Assessment: Faces Faces Pain Scale: Hurts whole lot Pain Location: abdomen Pain Descriptors / Indicators: Grimacing Pain Intervention(s): Limited activity within patient's tolerance;Monitored during session;Repositioned    Home Living                      Prior Function  PT Goals (current goals can now be found in the care plan section) Acute Rehab PT Goals Patient Stated Goal: return home PT Goal Formulation: With patient Time For Goal Achievement: 10/09/20 Potential to Achieve Goals: Good Progress towards PT goals: Progressing toward goals    Frequency    Min 3X/week      PT Plan Current plan remains appropriate    Co-evaluation              AM-PAC PT "6 Clicks" Mobility   Outcome Measure  Help needed turning from your back to your side while in a flat bed without using bedrails?: A Lot Help needed moving from lying on your back to sitting on the side of a flat bed without using bedrails?: A  Lot Help needed moving to and from a bed to a chair (including a wheelchair)?: A Lot Help needed standing up from a chair using your arms (e.g., wheelchair or bedside chair)?: A Lot Help needed to walk in hospital room?: A Lot Help needed climbing 3-5 steps with a railing? : Total 6 Click Score: 11    End of Session Equipment Utilized During Treatment: Oxygen Activity Tolerance: Patient limited by fatigue;Patient limited by lethargy Patient left: in bed;with call bell/phone within reach;with bed alarm set Nurse Communication: Mobility status PT Visit Diagnosis: Unsteadiness on feet (R26.81);Other abnormalities of gait and mobility (R26.89);Muscle weakness (generalized) (M62.81);Other symptoms and signs involving the nervous system (R29.898)     Time: 8144-8185 PT Time Calculation (min) (ACUTE ONLY): 10 min  Charges:  $Therapeutic Exercise: 8-22 mins                     2:59 PM, 10/06/20 Wyman Songster PT, DPT Physical Therapist at [facility]

## 2020-10-07 ENCOUNTER — Inpatient Hospital Stay (HOSPITAL_COMMUNITY): Payer: Medicare Other

## 2020-10-07 LAB — URINE CULTURE: Culture: NO GROWTH

## 2020-10-07 LAB — BASIC METABOLIC PANEL
Anion gap: 6 (ref 5–15)
BUN: 10 mg/dL (ref 8–23)
CO2: 27 mmol/L (ref 22–32)
Calcium: 8.1 mg/dL — ABNORMAL LOW (ref 8.9–10.3)
Chloride: 101 mmol/L (ref 98–111)
Creatinine, Ser: 0.56 mg/dL (ref 0.44–1.00)
GFR, Estimated: >60 mL/min (ref >60)
Glucose, Bld: 128 mg/dL — ABNORMAL HIGH (ref 70–99)
Potassium: 3.5 mmol/L (ref 3.5–5.1)
Sodium: 134 mmol/L — ABNORMAL LOW (ref 135–145)

## 2020-10-07 LAB — CBC
HCT: 30 % — ABNORMAL LOW (ref 36.0–46.0)
Hemoglobin: 9.8 g/dL — ABNORMAL LOW (ref 12.0–15.0)
MCH: 30.5 pg (ref 26.0–34.0)
MCHC: 32.7 g/dL (ref 30.0–36.0)
MCV: 93.5 fL (ref 80.0–100.0)
Platelets: 334 10*3/uL (ref 150–400)
RBC: 3.21 MIL/uL — ABNORMAL LOW (ref 3.87–5.11)
RDW: 14 % (ref 11.5–15.5)
WBC: 13.1 10*3/uL — ABNORMAL HIGH (ref 4.0–10.5)
nRBC: 0 % (ref 0.0–0.2)

## 2020-10-07 LAB — MAGNESIUM: Magnesium: 1.6 mg/dL — ABNORMAL LOW (ref 1.7–2.4)

## 2020-10-07 LAB — GLUCOSE, CAPILLARY: Glucose-Capillary: 128 mg/dL — ABNORMAL HIGH (ref 70–99)

## 2020-10-07 LAB — AMMONIA: Ammonia: 16 umol/L (ref 9–35)

## 2020-10-07 MED ORDER — ASPIRIN EC 81 MG PO TBEC
81.0000 mg | DELAYED_RELEASE_TABLET | Freq: Every day | ORAL | Status: DC
  Administered 2020-10-07 – 2020-10-09 (×3): 81 mg via ORAL
  Filled 2020-10-07 (×3): qty 1

## 2020-10-07 MED ORDER — MAGNESIUM SULFATE 2 GM/50ML IV SOLN
2.0000 g | Freq: Once | INTRAVENOUS | Status: AC
  Administered 2020-10-07: 2 g via INTRAVENOUS
  Filled 2020-10-07: qty 50

## 2020-10-07 NOTE — Progress Notes (Signed)
   10/07/20 0923  Assess: MEWS Score  BP (!) 151/72  Pulse Rate 84  Resp (!) 22  Level of Consciousness Responds to Voice  SpO2 96 %  O2 Device Nasal Cannula  O2 Flow Rate (L/min) 1.5 L/min  Assess: MEWS Score  MEWS Temp 0  MEWS Systolic 0  MEWS Pulse 0  MEWS RR 1  MEWS LOC 1  MEWS Score 2  MEWS Score Color Yellow  Treat  Pain Scale 0-10  Pain Score 0  Notify: Charge Nurse/RN  Name of Charge Nurse/RN Notified Nancy RN  Date Charge Nurse/RN Notified 10/07/20  Time Charge Nurse/RN Notified 0930  Notify: Provider  Provider Name/Title Dr Sherryll Burger  Date Provider Notified 10/07/20  Time Provider Notified 0930  Notification Type Page (secure chat)  Notification Reason Other (Comment) (respirations)

## 2020-10-07 NOTE — Consult Note (Signed)
[facility] Telemedicine Consult   Requesting Provider: Dr. Sherley BoundsPrateek Shah Consult Participants: Dr. Marthe PatchA. Spero Gunnels, Telespecialist RN Irving BurtonEmily,   bedside RN [facility] Location of the patient: [facility] room 329  This consult was provided via telemedicine with 2-way video and audio communication. The patient/family was informed that care would be provided in this way and agreed to receive care in this manner.    Chief Complaint: Right-sided weakness  HPI: 65 year old woman with no known past medical history presented to the emergency room on 09/18/2020 with altered mental status and was found to have bilateral thalamic and other embolic appearing infarctions and was admitted for further work-up.  This morning, she was noted to have weak right grip strength in the prior documentations and RN notes documented some grip strength on the right as well as difficulty with smiling an following commands for which an code stroke was activated. Patient is unable to provide any reliable history. Symptoms were discovered sometime around 10 AM today and last documentation of her last known well was 9 PM yesterday.  On my chart review, the patient was seen by Dr. Gerilyn Pilgrimoonquah from the neurology service on 09/18/2020-his assessment and plan mentioned that patient had acute mentation changes and encephalopathy due to multiple bilateral ischemic strokes most likely due to bilateral thalamic infarcts in particular with the etiology of the strokes being embolic and most likely cardioembolic.  Of note she also had a UTI which contributed to her acute encephalopathy. Stroke work-up was undertaken to include an echocardiogram with normal ejection fraction, mild LVH, normal LA size and no interatrial shunt.  Lipid panel revealed an LDL of 138 for which she was started on a statin.  A1c 5.5. Carotid ultrasound revealed a right ICA 50 to 69% stenosis and left ICA less than 50% stenosis.  Extremely motion  degraded MRI of the head with no acute changes or emergent occlusion. For stroke prevention she was started on dual antiplatelet therapy. Cardiology was consulted for TEE and loop recorder according to the primary team notes but deemed the patient not to be a good candidate and hence not pursued. She has undergone PEG tube placement   Past Medical History:  Diagnosis Date   Smoker    2 ppd x 55 years     Current Facility-Administered Medications:    acetaminophen (TYLENOL) tablet 650 mg, 650 mg, Oral, Q4H PRN, 650 mg at 10/06/20 2303 **OR** acetaminophen (TYLENOL) 160 MG/5ML solution 650 mg, 650 mg, Per Tube, Q4H PRN **OR** acetaminophen (TYLENOL) suppository 650 mg, 650 mg, Rectal, Q4H PRN, Zierle-Ghosh, Asia B, DO, 650 mg at 10/04/20 1743   amLODipine (NORVASC) tablet 5 mg, 5 mg, Per Tube, Daily, Tat, David, MD, 5 mg at 10/07/20 [id number]   aspirin EC tablet 81 mg, 81 mg, Oral, Daily, Milon DikesArora, Rease Wence, MD   atorvastatin (LIPITOR) tablet 80 mg, 80 mg, Per Tube, Daily, Tat, David, MD, 80 mg at 10/07/20 0936   [START ON 10/08/2020] cloNIDine (CATAPRES - Dosed in mg/24 hr) patch 0.2 mg, 0.2 mg, Transdermal, Weekly, Johnson, Clanford L, MD   clopidogrel (PLAVIX) tablet 75 mg, 75 mg, Per Tube, Daily, Tat, David, MD, 75 mg at 10/07/20 [id number]   feeding supplement (OSMOLITE 1.2 CAL) liquid 1,000 mL, 1,000 mL, Per Tube, Continuous, Johnson, Clanford L, MD, Last Rate: 55 mL/hr at 10/06/20 1000, 1,000 mL at 10/06/20 1000   folic acid injection 1 mg, 1 mg, Intravenous, Daily, Johnson, Clanford L, MD, 1 mg at 10/07/20 0940  free water 200 mL, 200 mL, Per Tube, Q8H, Tat, David, MD, 200 mL at 10/07/20 0630   heparin injection 5,000 Units, 5,000 Units, Subcutaneous, Q8H, Ralene Muskrat, PA-C, 5,000 Units at 10/07/20 0631   hydrALAZINE (APRESOLINE) injection 5 mg, 5 mg, Intravenous, Q6H PRN, Tat, David, MD, 5 mg at 09/30/20 7893   hydrALAZINE (APRESOLINE) tablet 10 mg, 10 mg, Oral, Q8H, Tat, David, MD, 10 mg at  10/07/20 8101   ketorolac (TORADOL) 15 MG/ML injection 7.5 mg, 7.5 mg, Intravenous, Q6H PRN, Johnson, Clanford L, MD, 7.5 mg at 10/04/20 2141   losartan (COZAAR) tablet 25 mg, 25 mg, Per Tube, Daily, Tat, David, MD, 25 mg at 10/07/20 7510   magnesium sulfate IVPB 2 g 50 mL, 2 g, Intravenous, Once, Sherryll Burger, Pratik D, DO   MEDLINE mouth rinse, 15 mL, Mouth Rinse, BID, Shahmehdi, Seyed A, MD, 15 mL at 10/07/20 0958   piperacillin-tazobactam (ZOSYN) IVPB 3.375 g, 3.375 g, Intravenous, Q8H, Shah, Pratik D, DO, Last Rate: 12.5 mL/hr at 10/07/20 0945, 3.375 g at 10/07/20 0945   pneumococcal 23 valent vaccine (PNEUMOVAX-23) injection 0.5 mL, 0.5 mL, Intramuscular, Tomorrow-1000, Tat, David, MD   scopolamine (TRANSDERM-SCOP) 1 MG/3DAYS 1.5 mg, 1 patch, Transdermal, Q72H, Tat, David, MD, 1.5 mg at 10/05/20 1723   simethicone (MYLICON) 40 MG/0.6ML suspension 20 mg, 20 mg, Oral, Q6H PRN, Dove, Tasha A, NP   thiamine tablet 100 mg, 100 mg, Per Tube, Daily, Tat, David, MD, 100 mg at 10/06/20 1002    LKW: 9 PM on 10/06/2020 tpa given?: No, recent strokes, essentially unchanged exam from prior IR Thrombectomy? No, severely disabled, poor MR S and again examination not attributable to a single vascular territory along with recent strokes and bilateral hemispheres and bilateral vascular distributions Modified Rankin Scale: 5-Severe disability-bedridden, incontinent, needs constant attention Time of teleneurologist evaluation: 11:10 AM  Exam: Vitals:   10/07/20 0923 10/07/20 0937  BP: (!) 151/72 (!) 151/72  Pulse: 84   Resp: (!) 22   Temp:    SpO2: 96%     General: Extremely pale and cachectic appearing woman in no distress. HEENT: Appears normocephalic and atraumatic CVS: Regular rate rhythm normal in the monitor Neurological exam She is awake, alert, oriented to self. She was able to tell her correct date of birth and age. Her speech is dysarthric For the month, she said it is June She is able to  follow simple commands Pupils are equal round react light, extraocular movements appeared unhindered, no visual field cuts, difficult assess facial asymmetry-no asymmetry at rest but was unable to smile much. On motor examination, she has very minimal 1-2/5 movement of the right upper extremity, left upper extremity is at least 4+/5 without vertical drift, left lower extremity is also 4-4+/5 without drift.  Right lower extremity is 3-4/5 with vertical drift. Sensation intact to touch Difficulty perform coordination NIH stroke scale as below   NIHSS NIHSS Score: 7  1a-Level of Consciousness: 0  1b-What is Month/Age: 66  1c-Open/Close Eyes&Hand: 0  2 -Best Gaze: 0  3 -Visual Fields: 0  4 -Facial Palsy: 0  5a-Motor-Left Arm: 0  5b-Motor-Right Arm: 4  6a-Motor-Left Leg: 0  6b-Motor-Right Leg: 2  7 -Limb Ataxia: 0  8 -Sensory: 0  9 -Best Language: 0  10-Dysarthria: 1  11-Extinction/Inattention: 0 NIHSS Score: 7   Imaging Reviewed: CT head code stroke with expected evolution of small bilateral lacunar type infarct seen by MRI last month primarily affecting the deep gray nuclei.  No  hemorrhagic transformation or mass-effect.  No acute intracranial abnormality.  Labs reviewed in epic and pertinent values follow: CBC    Component Value Date/Time   WBC 13.1 (H) 10/07/2020 0537   RBC 3.21 (L) 10/07/2020 0537   HGB 9.8 (L) 10/07/2020 0537   HCT 30.0 (L) 10/07/2020 0537   PLT 334 10/07/2020 0537   MCV 93.5 10/07/2020 0537   MCH 30.5 10/07/2020 0537   MCHC 32.7 10/07/2020 0537   RDW 14.0 10/07/2020 0537   LYMPHSABS 0.6 (L) 10/02/2020 0620   MONOABS 0.6 10/02/2020 0620   EOSABS 0.1 10/02/2020 0620   BASOSABS 0.0 10/02/2020 0620   CMP     Component Value Date/Time   NA 134 (L) 10/07/2020 0537   K 3.5 10/07/2020 0537   CL 101 10/07/2020 0537   CO2 27 10/07/2020 0537   GLUCOSE 128 (H) 10/07/2020 0537   BUN 10 10/07/2020 0537   CREATININE 0.56 10/07/2020 0537   CALCIUM 8.1 (L)  10/07/2020 0537   PROT 6.6 09/18/2020 0351   ALBUMIN 3.7 09/18/2020 0351   AST 16 09/18/2020 0351   ALT 12 09/18/2020 0351   ALKPHOS 66 09/18/2020 0351   BILITOT 0.6 09/18/2020 0351   GFRNONAA >60 10/07/2020 0537     Assessment: 65 year old woman with presumed no past medical history prior to presentation came in with altered mental status and noted to have bilateral scattered infarcts primarily affecting bilateral thalami as well as bilateral internal capsules and bilateral hemispheric white matter not in a unified territory concerning for embolic infarctions.  This was on 09/17/2020.  She was today noted to be weaker on the right side which was already noted to be weak-the oncoming RN noted that the right grip strength previously was documented to be present but she could not get her to grip from the right arm.  The patient was able to follow commands but was having a hard time performing simple tasks such as smiling to check facial symmetry. On my examination, the patient is definitely hemiparetic on the right with the right upper extremity much more weaker than the right lower extremity-face appears symmetric, she is able to follow commands, no cranial nerve deficits-NIH stroke scale 7. The exam seems pretty much similar to what was documented by Dr. Gerilyn Pilgrim on arrival. She also had UTI that had contributed to her encephalopathy along with the bilateral thalamic strokes. Her white count has been uptrending-I would check for an infectious work-up. From a stroke standpoint, because of last known well at least 9 PM yesterday-which I doubt also might not be true because she has had significant right-sided weakness ever since presentation-and the recent stroke she is not a candidate for IV tPA and due to her debilitated state, also not a candidate for EVT. Her stroke work-up essentially has been completed at [facility].  She does need continued outpatient monitoring for embolic cause in the  form of a loop recorder to look for atrial fibrillation.  Recommendations:  Infectious work-up per primary team and management per primary team. Continue dual antiplatelets and statins Outpatient loop recorder placement should be considered if patient remains full scope of care-I would prefer that she get a TEE and loop recorder while inpatient but according to chart review, cardiology has always been consulted and do not feel that she is appropriate at this time. Check urinalysis, chest x-ray, ammonia-treat if abnormal. Plan was relayed to the attending provider Dr. Sherryll Burger over secure chat.    This patient is receiving  care for possible acute neurological changes. There was 35 minutes of care by this provider at the time of service, including time for direct evaluation via telemedicine, review of medical records, imaging studies and discussion of findings with providers, the patient and/or family.  -- Milon Dikes, MD [facility] Pager: [phone] If 7pm to 7am, please call on call as listed on AMION.

## 2020-10-07 NOTE — Progress Notes (Signed)
PROGRESS NOTE    Cleora Karnik Murgia  CNO:[id number] DOB: 15-Jan-1956 DOA: 09/17/2020 PCP: Pcp, No   Brief Narrative:   65 y.o. female, with no known medical history, who presents to the ED with altered mental status. History is quite limited 2/2 patient's acute encephalopathy. Earlier, family was able to provide some history to the ED provider. They reported that the patient's last known normal was 6/24. She has been somnolent, and not taking care of herself in that interval. Family was not aware of an EtOH or illicit drug use. They are not aware of an trauma, and there are not signs of trauma on exam. Family reported no infectious symptoms or fevers.  Work up revealed patient suffered bilateral thalamic infarcts that have contributed to her encephalopathy.  After a goals of care discussion, patient's son wanted full scope of care.  Patient failed swallow eval and PEG tube was placed 09/22/20 and enteral feeding was started and titrated to goal rate without difficulty.  ED: Vitals stable, encephalopathic CT head shows focal regions of hypoattenuation seen in the genu of the right internal capsule, posterior limb of the left internal capsule and right thalamus which could reflect age-indeterminate infarction.  No other acute abnormality  Assessment & Plan:   Active Problems:   Acute metabolic encephalopathy   CVA (cerebral vascular accident) (HCC)   Leukocytosis   Essential hypertension   Vitamin D deficiency   Dental caries   Dysphagia   Hypokalemia  Devastating Acute ischemic stroke/Acute metabolic Encephalopathy -Dr. Arbutus Leas discussed with neurology, Dr. Eligah East most likely due to stroke along with UTI -MR brain--scattered bilateral infarcts of thalamus -carotid US--R-ICA--50-69%; L-ICA <50% -MRA brain--no LVO -Echo (TTE)--EF 60-65%, no WMA, no PFO -A1C--5.5 -LDL--138 -continue ASA and plavix -discussed with cardiology--not a good candidate for TEE nor loop  recorder -TSH--1.927 -ammonia 15>>26 -B12--262 -folate 4.8 -UA--recheck ordered 7/10--->negative -Code stroke called today on account of what was felt to be right upper extremity weakness that was new compared to 7/15.  Seen by teleneurology with no concern for new or acute stroke and exam appears to be stable based on prior neurology evaluation.  CT head with no acute findings.  Continue dual antiplatelet therapy as well as statin and have outpatient loop recorder placed.   Sepsis secondary to UTI -Procalcitonin noted to be elevated on 7/13 -She was started on vancomycin and Zosyn empirically, downgraded to Zosyn only -Plan to follow blood cultures -No further febrile episodes noted -Chest x-ray with no significant findings -Urine cultures with no growth noted -Plan to repeat procalcitonin and if low by a.m., will discontinue Zosyn   Hyperlipidemia -continue statin when able to take p.o. or have PEG re-placed    Severe Dysphagia with high aspiration risk -speech eval-->not safe for po intake -IR placed PEG 09/22/20 -started enteral feeding 7/2 -tolerated Osmolite 1.2 @ 40 cc/hr -Free Water 200 cc every 8 hours -PT REMOVED PEG ON 7/8 -IR has replaced PEG tube 7/13 -tube feeding resumed on 7/14   Hypertension  -allowed permissive hypertension initially -since PEG was removed by patient we cannot give oral BP meds -I have ordered IV BP meds until patient can have a new PEG placed. -added clonidine patch 7/10 -increased hydralazine to 15 mg every 4 hours, increased metoprolol to 7 mg every 6 hours. - increased clonidine patch dose to 0.2 mg on 7/12 while she is still NPO and has no PEG tube in place     Hypomagnesemia -IV replacement ordered and will recheck  in a.m.   Tobacco abuse -she was smoking up to 2 ppd prior to admssion -cessation discussed   Generalized weakness and debilitation - B12 is WNL - check 25-OH vitamin D --->12.29.  ADD VIT D SUPP WHEN PEG IN PLACE    Severe Vitamin D deficiency - Add Vitamin D supplementation when able.      DVT prophylaxis:Heparin Code Status: Full Family Communication: Discussed with son on phone 7/15 Disposition Plan:  Status is: Inpatient   Remains inpatient appropriate because:IV treatments appropriate due to intensity of illness or inability to take PO and Inpatient level of care appropriate due to severity of illness   Dispo: The patient is from: Home              Anticipated d/c is to: SNF              Patient currently is not medically stable to d/c.              Difficult to place patient No     Consultants:  IR Neurology   Procedures:  PEG placement 7/13  Antimicrobials:  Anti-infectives (From admission, onward)    Start     Dose/Rate Route Frequency Ordered Stop   10/05/20 1900  vancomycin (VANCOREADY) IVPB 750 mg/150 mL  Status:  Discontinued       See Hyperspace for full Linked Orders Report.   750 mg 150 mL/hr over 60 Minutes Intravenous Every 24 hours 10/04/20 1809 10/05/20 0743   10/04/20 1900  vancomycin (VANCOCIN) IVPB 1000 mg/200 mL premix       See Hyperspace for full Linked Orders Report.   1,000 mg 200 mL/hr over 60 Minutes Intravenous  Once 10/04/20 1809 10/04/20 2046   10/04/20 1815  piperacillin-tazobactam (ZOSYN) IVPB 3.375 g        3.375 g 12.5 mL/hr over 240 Minutes Intravenous Every 8 hours 10/04/20 1804     10/04/20 1014  ceFAZolin (ANCEF) IVPB 2g/100 mL premix        over 30 Minutes Intravenous Continuous PRN 10/04/20 1019 10/04/20 1014   10/04/20 1011  ceFAZolin (ANCEF) 2-4 GM/100ML-% IVPB       Note to Pharmacy: Cristy Friedlanderusterman, Kyle   : cabinet override      10/04/20 1011 10/04/20 1037   09/22/20 1001  ceFAZolin (ANCEF) 2-4 GM/100ML-% IVPB       Note to Pharmacy: Guido SanderNikolich, Gregory   : cabinet override      09/22/20 1001 09/22/20 2214   09/22/20 0000  ceFAZolin (ANCEF) IVPB 2g/100 mL premix        2 g 200 mL/hr over 30 Minutes Intravenous To Radiology 09/21/20 1256  09/22/20 1040       Subjective: Patient seen and evaluated today with ongoing confusion and somnolence noted.  Objective: Vitals:   10/06/20 2126 10/07/20 0541 10/07/20 0923 10/07/20 0937  BP: (!) 147/68 (!) 158/76 (!) 151/72 (!) 151/72  Pulse: 86 94 84   Resp: 19 18 (!) 22   Temp: 98.6 F (37 C) 98.3 F (36.8 C)    TempSrc: Oral     SpO2: 96% 92% 96%   Weight:      Height:        Intake/Output Summary (Last 24 hours) at 10/07/2020 1251 Last data filed at 10/07/2020 0900 Gross per 24 hour  Intake 0 ml  Output 1000 ml  Net -1000 ml   Filed Weights   09/18/20 1600  Weight: 42.1 kg    Examination:  General  exam: Appears calm and comfortable, confused with incoherent speech Respiratory system: Clear to auscultation. Respiratory effort normal. Cardiovascular system: S1 & S2 heard, RRR.  Gastrointestinal system: Abdomen is soft, PEG tube clean dry and intact Central nervous system: Mostly somnolent Extremities: No edema Skin: No significant lesions noted Psychiatry: Flat affect.    Data Reviewed: I have personally reviewed following labs and imaging studies  CBC: Recent Labs  Lab 10/01/20 0425 10/02/20 0620 10/05/20 0544 10/06/20 0534 10/07/20 0537  WBC 13.1* 9.8 11.2* 11.6* 13.1*  NEUTROABS 11.6* 8.4*  --   --   --   HGB 11.4* 11.8* 9.8* 9.7* 9.8*  HCT 34.5* 35.2* 30.2* 29.4* 30.0*  MCV 95.3 94.4 96.2 94.2 93.5  PLT 360 395 334 333 334   Basic Metabolic Panel: Recent Labs  Lab 10/02/20 0620 10/03/20 0330 10/04/20 0410 10/05/20 0544 10/06/20 0534 10/07/20 0537  NA 145 145 143 138 137 134*  K 3.0* 2.9* 2.9* 4.5 3.1* 3.5  CL 112* 113* 112* 111 106 101  CO2 26 25 23 22 25 27   GLUCOSE 119* 109* 113* 211* 121* 128*  BUN 24* 18 13 12 13 10   CREATININE 0.59 0.61 0.59 0.64 0.64 0.56  CALCIUM 8.7* 8.5* 8.0* 7.2* 8.1* 8.1*  MG 2.3  --  1.9 1.7 1.7 1.6*   GFR: Estimated Creatinine Clearance: 46 mL/min (by C-G formula based on SCr of 0.56  mg/dL). Liver Function Tests: No results for input(s): AST, ALT, ALKPHOS, BILITOT, PROT, ALBUMIN in the last 168 hours. No results for input(s): LIPASE, AMYLASE in the last 168 hours. No results for input(s): AMMONIA in the last 168 hours. Coagulation Profile: No results for input(s): INR, PROTIME in the last 168 hours. Cardiac Enzymes: No results for input(s): CKTOTAL, CKMB, CKMBINDEX, TROPONINI in the last 168 hours. BNP (last 3 results) No results for input(s): PROBNP in the last 8760 hours. HbA1C: No results for input(s): HGBA1C in the last 72 hours. CBG: Recent Labs  Lab 10/04/20 1149 10/06/20 0745 10/06/20 1617 10/07/20 1058  GLUCAP 134* 130* 139* 128*   Lipid Profile: No results for input(s): CHOL, HDL, LDLCALC, TRIG, CHOLHDL, LDLDIRECT in the last 72 hours. Thyroid Function Tests: No results for input(s): TSH, T4TOTAL, FREET4, T3FREE, THYROIDAB in the last 72 hours. Anemia Panel: No results for input(s): VITAMINB12, FOLATE, FERRITIN, TIBC, IRON, RETICCTPCT in the last 72 hours. Sepsis Labs: Recent Labs  Lab 10/04/20 1808 10/04/20 2055 10/05/20 0544 10/06/20 0534  PROCALCITON 2.34  --  2.27 1.85  LATICACIDVEN 0.7 0.6  --   --     Recent Results (from the past 240 hour(s))  Culture, blood (Routine X 2) w Reflex to ID Panel     Status: None (Preliminary result)   Collection Time: 10/03/20  2:09 PM   Specimen: BLOOD RIGHT HAND  Result Value Ref Range Status   Specimen Description   Final    BLOOD RIGHT HAND BOTTLES DRAWN AEROBIC AND ANAEROBIC   Special Requests Blood Culture adequate volume  Final   Culture   Final    NO GROWTH 2 DAYS Performed at [facility], [address] [place], [address] [place]    Report Status PENDING  Incomplete  Culture, blood (Routine X 2) w Reflex to ID Panel     Status: None (Preliminary result)   Collection Time: 10/03/20  4:07 PM   Specimen: BLOOD  Result Value Ref Range Status   Specimen Description BLOOD  Final   Special  Requests NONE  Final  Culture   Final    NO GROWTH 2 DAYS Performed at [facility], [address] [place], [place] [address]    Report Status PENDING  Incomplete  MRSA Next Gen by PCR, Nasal     Status: None   Collection Time: 10/04/20  5:42 PM   Specimen: Nasal Mucosa; Nasal Swab  Result Value Ref Range Status   MRSA by PCR Next Gen NOT DETECTED NOT DETECTED Final    Comment: (NOTE) The GeneXpert MRSA Assay (FDA approved for NASAL specimens only), is one component of a comprehensive MRSA colonization surveillance program. It is not intended to diagnose MRSA infection nor to guide or monitor treatment for MRSA infections. Test performance is not FDA approved in patients less than 24 years old. Performed at [facility], [address], [place], [place] [address]   Culture, blood (routine x 2)     Status: None (Preliminary result)   Collection Time: 10/04/20  6:08 PM   Specimen: BLOOD RIGHT HAND  Result Value Ref Range Status   Specimen Description BLOOD RIGHT HAND  Final   Special Requests   Final    BOTTLES DRAWN AEROBIC AND ANAEROBIC Blood Culture adequate volume   Culture   Final    NO GROWTH < 12 HOURS Performed at [facility], [address] [place], [place] [address]    Report Status PENDING  Incomplete  Culture, blood (routine x 2)     Status: None (Preliminary result)   Collection Time: 10/04/20  6:08 PM   Specimen: BLOOD LEFT HAND  Result Value Ref Range Status   Specimen Description BLOOD LEFT HAND  Final   Special Requests   Final    BOTTLES DRAWN AEROBIC AND ANAEROBIC Blood Culture adequate volume   Culture   Final    NO GROWTH < 12 HOURS Performed at [facility], [address] [place], [place] [address]    Report Status PENDING  Incomplete  Urine Culture     Status: None   Collection Time: 10/05/20 12:00 PM   Specimen: Urine, Clean Catch  Result Value Ref Range Status   Specimen Description   Final    URINE, CLEAN CATCH Performed at [facility], [address] [place], [place] [address]    Special Requests   Final    NONE Performed at [facility], [address] [place], [place] [address]    Culture   Final    NO GROWTH Performed at [facility], [address] [place], [place] [address]    Report Status 10/07/2020 FINAL  Final         Radiology Studies: CT HEAD WO CONTRAST  Result Date: 10/07/2020 CLINICAL DATA:  65 year old female status post bilateral deep gray nuclei infarcts last month. Altered mental status. EXAM: CT HEAD WITHOUT CONTRAST TECHNIQUE: Contiguous axial images were obtained from the base of the skull through the vertex without intravenous contrast. COMPARISON:  Brain MRI 09/18/2020.  Head CT 09/17/2020. FINDINGS: Brain: Expected evolution of the bilateral thalamic and lentiform lacunar infarcts seen by MRI last month. No hemorrhagic transformation. No significant mass effect. Occasional smaller bilateral frontal lobe white matter infarcts remain largely occult by CT. No midline shift, ventriculomegaly, mass effect, evidence of mass lesion, intracranial hemorrhage or evidence of cortically based acute infarction. Vascular: Calcified atherosclerosis at the skull base. No suspicious intracranial vascular hyperdensity. Skull: No acute osseous abnormality identified. Sinuses/Orbits: Visualized paranasal sinuses and mastoids are stable and well aerated. Other: Visualized orbits and scalp  soft tissues are within normal limits. IMPRESSION: 1. Expected evolution of the small bilateral lacunar type infarcts seen by MRI last month, primarily affecting the deep gray nuclei. No hemorrhagic transformation or mass effect. 2. No new intracranial abnormality identified. Electronically Signed   By: Odessa Fleming M.D.   On: 10/07/2020 11:24        Scheduled Meds:  amLODipine  5 mg Per Tube Daily   aspirin EC  81 mg Oral Daily   atorvastatin  80 mg Per Tube Daily   [START ON 10/08/2020] cloNIDine  0.2 mg Transdermal Weekly    clopidogrel  75 mg Per Tube Daily   folic acid  1 mg Intravenous Daily   free water  200 mL Per Tube Q8H   heparin  5,000 Units Subcutaneous Q8H   hydrALAZINE  10 mg Oral Q8H   losartan  25 mg Per Tube Daily   mouth rinse  15 mL Mouth Rinse BID   pneumococcal 23 valent vaccine  0.5 mL Intramuscular Tomorrow-1000   scopolamine  1 patch Transdermal Q72H   thiamine  100 mg Per Tube Daily   Continuous Infusions:  feeding supplement (OSMOLITE 1.2 CAL) 1,000 mL (10/06/20 1000)   magnesium sulfate bolus IVPB 2 g (10/07/20 1239)   piperacillin-tazobactam (ZOSYN)  IV 3.375 g (10/07/20 0945)     LOS: 19 days    Time spent: 35 minutes    Markeya Mincy Hoover Brunette, DO [facility]  If 7PM-7AM, please contact night-coverage [email or web address] 10/07/2020, 12:51 PM

## 2020-10-07 NOTE — Progress Notes (Signed)
Stroke times  1049 phone call 1050 [place] stroke page 1106 exam started 1110 exam finished 1114 Images sent to [facility] 1118 Exam completed in [facility] 1114 [facility] Radiology called

## 2020-10-08 ENCOUNTER — Inpatient Hospital Stay (HOSPITAL_COMMUNITY): Payer: Medicare Other

## 2020-10-08 LAB — BASIC METABOLIC PANEL
Anion gap: 6 (ref 5–15)
BUN: 11 mg/dL (ref 8–23)
CO2: 30 mmol/L (ref 22–32)
Calcium: 8.1 mg/dL — ABNORMAL LOW (ref 8.9–10.3)
Chloride: 97 mmol/L — ABNORMAL LOW (ref 98–111)
Creatinine, Ser: 0.51 mg/dL (ref 0.44–1.00)
GFR, Estimated: >60 mL/min (ref >60)
Glucose, Bld: 126 mg/dL — ABNORMAL HIGH (ref 70–99)
Potassium: 3.7 mmol/L (ref 3.5–5.1)
Sodium: 133 mmol/L — ABNORMAL LOW (ref 135–145)

## 2020-10-08 LAB — CBC
HCT: 28.4 % — ABNORMAL LOW (ref 36.0–46.0)
Hemoglobin: 9.6 g/dL — ABNORMAL LOW (ref 12.0–15.0)
MCH: 31.3 pg (ref 26.0–34.0)
MCHC: 33.8 g/dL (ref 30.0–36.0)
MCV: 92.5 fL (ref 80.0–100.0)
Platelets: 343 10*3/uL (ref 150–400)
RBC: 3.07 MIL/uL — ABNORMAL LOW (ref 3.87–5.11)
RDW: 14 % (ref 11.5–15.5)
WBC: 12.8 10*3/uL — ABNORMAL HIGH (ref 4.0–10.5)
nRBC: 0 % (ref 0.0–0.2)

## 2020-10-08 LAB — CULTURE, BLOOD (ROUTINE X 2)
Culture: NO GROWTH
Culture: NO GROWTH
Special Requests: ADEQUATE

## 2020-10-08 LAB — MAGNESIUM: Magnesium: 2.1 mg/dL (ref 1.7–2.4)

## 2020-10-08 LAB — PROCALCITONIN: Procalcitonin: 0.82 ng/mL

## 2020-10-08 NOTE — TOC Progression Note (Signed)
Transition of Care [facility]) - Progression Note    Patient Details  Name: Kendra Lee MRN: [id number] Date of Birth: 07/18/1955  Transition of Care [facility]) CM/SW Contact  Barry Brunner, LCSW Phone Number: 10/08/2020, 12:26 PM  Clinical Narrative:    Eunice Blase with [facility] reported that she is unable to take the patient on 10/08/2020 due to patients assigned roommate with [facility] now having Covid. Eunice Blase reported that she will be able to take patient on 10/09/2020 and is aware of auth expiration. TOC to follow.   Expected Discharge Plan: Skilled Nursing Facility Barriers to Discharge: English as a second language teacher, Continued Medical Work up  Expected Discharge Plan and Services Expected Discharge Plan: Skilled Nursing Facility       Living arrangements for the past 2 months: Single Family Home Expected Discharge Date: 10/08/20                                     Social Determinants of Health (SDOH) Interventions    Readmission Risk Interventions Readmission Risk Prevention Plan 09/20/2020  Medication Screening Complete  Transportation Screening Complete

## 2020-10-08 NOTE — Discharge Summary (Signed)
Physician Discharge Summary  Kendra Lee XBJ:[id number] DOB: 1956/02/11 DOA: 09/17/2020  PCP: Pcp, No  Admit date: 09/17/2020  Discharge date: 10/08/2020  Admitted From:SNF  Disposition:  SNF  Recommendations for Outpatient Follow-up:  Follow up with PCP in 1-2 weeks Follow-up with cardiology outpatient for loop recorder placement Continue dual antiplatelet therapy and statin as prescribed Continue tube feeds No further need for IV antibiotics  Home Health: None  Equipment/Devices: None  Discharge Condition:Stable  CODE STATUS: Full  Diet recommendation: Tube feeds as prescribed  Brief/Interim Summary: 65 y.o. female, with no known medical history, who presented to the ED with altered mental status. History was quite limited 2/2 patient's acute encephalopathy. Earlier, family was able to provide some history to the ED provider. They reported that the patient's last known normal was 6/24. She has been somnolent, and not taking care of herself in that interval. Family was not aware of an EtOH or illicit drug use. They were not aware of any trauma, and there are not signs of trauma on exam. Family reported no infectious symptoms or fevers.    Work up revealed patient suffered bilateral thalamic infarcts that have contributed to her encephalopathy.  After a goals of care discussion, patient's son wanted full scope of care.  Patient failed swallow eval and PEG tube was placed 09/22/20 and enteral feeding was started and titrated to goal rate without difficulty.  She was also noted to have a UTI, but no growth was noted on the cultures and she was empirically treated with IV antibiotics.  She removed her PEG tube on 7/8 and IR had to replace it on 7/13 and tube feeds were resumed on 7/14.  She is tolerating her tube feeds appropriately.  On 7/16 she was thought to have some acute deficits to her right upper extremity, but this is consistent with her previous exam and CT head appeared stable.   Plans are to continue on dual antiplatelet therapy as well as statin per neurology recommendations and follow-up with cardiology outpatient for implantable loop recorder placement.  She has no further signs of infectious etiology and appears to have received maximal medical benefit during the course of the stay and is stable for discharge back to her SNF.  Discharge Diagnoses:  Active Problems:   Acute metabolic encephalopathy   CVA (cerebral vascular accident) (HCC)   Leukocytosis   Essential hypertension   Vitamin D deficiency   Dental caries   Dysphagia   Hypokalemia  Principal discharge diagnosis: Acute encephalopathy multifactorial with bilateral thalamic CVA as well as acute metabolic encephalopathy with sepsis secondary to UTI.  Discharge Instructions  Discharge Instructions     Amb Referral to Palliative Care   Complete by: As directed    Diet - low sodium heart healthy   Complete by: As directed    Discharge wound care:   Complete by: As directed    Daily dressing changes.   Increase activity slowly   Complete by: As directed       Allergies as of 10/08/2020   No Known Allergies      Medication List     TAKE these medications    amLODipine 5 MG tablet Commonly known as: NORVASC Place 1 tablet (5 mg total) into feeding tube daily.   aspirin 81 MG chewable tablet Place 1 tablet (81 mg total) into feeding tube daily.   atorvastatin 80 MG tablet Commonly known as: LIPITOR Place 1 tablet (80 mg total) into feeding tube daily.  clopidogrel 75 MG tablet Commonly known as: PLAVIX Place 1 tablet (75 mg total) into feeding tube daily.   feeding supplement (OSMOLITE 1.2 CAL) Liqd Infuse at 55 cc/hour via pump   free water Soln Place 200 mLs into feeding tube every 8 (eight) hours.   hydrALAZINE 10 MG tablet Commonly known as: APRESOLINE Take 1 tablet (10 mg total) by mouth every 8 (eight) hours.   losartan 25 MG tablet Commonly known as: COZAAR Place  1 tablet (25 mg total) into feeding tube daily.   thiamine 100 MG tablet Place 1 tablet (100 mg total) into feeding tube daily.               Discharge Care Instructions  (From admission, onward)           Start     Ordered   10/08/20 0000  Discharge wound care:       Comments: Daily dressing changes.   10/08/20 1032            Contact information for after-discharge care     Destination     [facility] [place] .   Service: Skilled Nursing Contact information: [address] [place] [address] [phone]                    No Known Allergies  Consultations: Neurology IR   Procedures/Studies: CT ABDOMEN WO CONTRAST  Result Date: 09/21/2020 CLINICAL DATA:  Evaluate anatomy prior to potential percutaneous gastrostomy tube placement. EXAM: CT ABDOMEN WITHOUT CONTRAST TECHNIQUE: Multidetector CT imaging of the abdomen was performed following the standard protocol without IV contrast. COMPARISON:  None. FINDINGS: The lack of intravenous contrast limits the ability to evaluate solid abdominal organs. Lower chest: Limited visualization of the lower thorax demonstrates minimal dependent subpleural atelectasis. Normal heart size. No pericardial effusion. Hepatobiliary: Normal hepatic contour. Normal noncontrast appearance of the gallbladder given degree distention. No radiopaque gallstones. There is a trace amount of perihepatic ascites. Pancreas: Normal noncontrast appearance of the pancreas. Spleen: Normal noncontrast appearance of the spleen. 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 Normal noncontrast appearance of the right kidney. No renal stones. No right-sided urinary obstruction. Normal noncontrast appearance the bilateral adrenal glands. The urinary bladder was not imaged. Stomach/Bowel: The anterior wall the stomach is  well apposed against the ventral wall of the abdomen without interposition of the hepatic parenchyma or the transverse colon. Moderate colonic stool burden without evidence of enteric obstruction. No pneumoperitoneum, pneumatosis or portal venous gas. Vascular/Lymphatic: Large amount of irregular calcified atherosclerotic plaque within a bilobed ectatic abdominal aorta with dominant caudal component measuring 2.6 cm and maximal oblique short axis coronal diameter (coronal image 33, series 5). No bulky retroperitoneal or mesenteric lymphadenopathy. Other: Regional soft tissues appear normal. Musculoskeletal: No acute or aggressive osseous abnormalities. Mild DDD of L4-L5 with disc space height loss, endplate irregularity and sclerosis. IMPRESSION: 1. Gastric anatomy amenable to potential percutaneous gastrostomy tube placement as indicated. 2. 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 3. Mildly ectatic abdominal aorta measuring 2.6 cm in maximal diameter. Recommend follow-up aortic ultrasound in 5 years. This recommendation follows ACR consensus guidelines: White Paper of the ACR Incidental Findings Committee II on Vascular Findings. J Am Coll Radiol 2013; 10:789-794. 4. Aortic aneurysm NOS (ICD10-I71.9). Electronically Signed   By: Simonne Come M.D.   On: 09/21/2020 09:36   DG Chest 1 View  Result Date: 10/08/2020 CLINICAL DATA:  Fever EXAM: CHEST  1 VIEW COMPARISON:  10/04/2020 FINDINGS: Stable cardiomediastinal contours. Atherosclerotic calcification of the aortic knob. Hyperinflated lungs with chronically coarsened interstitial markings. No new focal airspace consolidation. No pleural effusion or pneumothorax. Electronic device again noted projecting over the left chest wall. IMPRESSION: No acute cardiopulmonary disease.  No interval change. Electronically Signed   By: Duanne Guess D.O.   On: 10/08/2020 09:43   DG Chest 1 View  Result  Date: 10/04/2020 CLINICAL DATA:  Fever and lethargy EXAM: CHEST  1 VIEW COMPARISON:  10/01/2020 FINDINGS: Cardiac shadow is within normal limits. Aortic calcifications are again seen. Lungs are well aerated bilaterally without focal infiltrate or sizable effusion. Chronic interstitial markings are seen. No bony abnormality is noted. IMPRESSION: No acute abnormality seen.  No change from the prior exam. Electronically Signed   By: Alcide Clever M.D.   On: 10/04/2020 19:27   DG Abd 1 View  Result Date: 10/05/2020 CLINICAL DATA:  Abdominal pain. Modified barium swallow 2 days prior. EXAM: ABDOMEN - 1 VIEW COMPARISON:  None. FINDINGS: Retained barium contrast throughout the colon. No dilated small bowel loops. Percutaneous gastrostomy tube terminates over the expected location of body of the stomach in the medial left upper quadrant. No evidence of pneumatosis or pneumoperitoneum. IMPRESSION: Nonobstructive bowel gas pattern. Retained barium contrast throughout the colon. Electronically Signed   By: Delbert Phenix M.D.   On: 10/05/2020 12:06   CT HEAD WO CONTRAST  Result Date: 10/07/2020 CLINICAL DATA:  65 year old female status post bilateral deep gray nuclei infarcts last month. Altered mental status. EXAM: CT HEAD WITHOUT CONTRAST TECHNIQUE: Contiguous axial images were obtained from the base of the skull through the vertex without intravenous contrast. COMPARISON:  Brain MRI 09/18/2020.  Head CT 09/17/2020. FINDINGS: Brain: Expected evolution of the bilateral thalamic and lentiform lacunar infarcts seen by MRI last month. No hemorrhagic transformation. No significant mass effect. Occasional smaller bilateral frontal lobe white matter infarcts remain largely occult by CT. No midline shift, ventriculomegaly, mass effect, evidence of mass lesion, intracranial hemorrhage or evidence of cortically based acute infarction. Vascular: Calcified atherosclerosis at the skull base. No suspicious intracranial vascular  hyperdensity. Skull: No acute osseous abnormality identified. Sinuses/Orbits: Visualized paranasal sinuses and mastoids are stable and well aerated. Other: Visualized orbits and scalp soft tissues are within normal limits. IMPRESSION: 1. Expected evolution of the small bilateral lacunar type infarcts seen by MRI last month, primarily affecting the deep gray nuclei. No hemorrhagic transformation or mass effect. 2. No new intracranial abnormality identified. Electronically Signed   By: Odessa Fleming M.D.   On: 10/07/2020 11:24   CT Head Wo Contrast  Result Date: 09/17/2020 CLINICAL DATA:  Mental status change, found this afternoon on couch EXAM: CT HEAD WITHOUT CONTRAST TECHNIQUE: Contiguous axial images were obtained from the base of the skull through the vertex without intravenous contrast. COMPARISON:  None. FINDINGS: Brain: Foci of hypoattenuation present in the right internal capsule as as a separate focus in the right caudate and posterior limb of the left internal capsule could reflect sequela of age-indeterminate lacunar type infarcts difficult to fully assess given a background of more diffuse patchy white matter hypoattenuation typically indicative of microvascular angiopathy in a patient of this age. Additional hypoattenuation in the pons and brainstem is more nonspecific given streak artifact across skull base. Symmetric prominence of the ventricles, cisterns and sulci compatible with parenchymal volume loss. No hyperdense hemorrhage. No mass effect or midline shift.  No extra-axial collection. Scattered benign dural calcifications. Vascular: Atherosclerotic calcification of the carotid siphons. No hyperdense vessel. Skull: No calvarial fracture or suspicious osseous lesion. No scalp swelling or hematoma. Sinuses/Orbits: Paranasal sinuses and mastoid air cells are predominantly clear. Other: Included orbital structures are unremarkable. IMPRESSION: Focal regions of hypoattenuation are seen in the genu of the  right internal capsule, posterior limb of the left internal capsule and right thalamus which could reflect age-indeterminate infarction, particularly in the absence of comparison imaging and on a background of likely microvascular angiopathy. Could be further characterized with MR imaging as warranted. No other acute intracranial abnormality. These results were called by telephone at the time of interpretation on 09/17/2020 at 11:45 pm to provider Benjiman CoreNATHAN PICKERING , who verbally acknowledged these results. Electronically Signed   By: Kreg ShropshirePrice  DeHay M.D.   On: 09/17/2020 23:45   MR ANGIO HEAD WO CONTRAST  Result Date: 09/18/2020 CLINICAL DATA:  Altered mental status. EXAM: MRI HEAD WITHOUT CONTRAST MRA HEAD WITHOUT CONTRAST TECHNIQUE: Multiplanar, multi-echo pulse sequences of the brain and surrounding structures were acquired without intravenous contrast. Angiographic images of the Circle of Willis were acquired using MRA technique without intravenous contrast. COMPARISON:  No pertinent prior exam. FINDINGS: MRI HEAD FINDINGS Brain: Restricted diffusion in the bilateral thalamus, bilateral internal capsule, left centrum semiovale, and right subcortical frontal. These have the appearance of acute infarcts. Mild small vessel ischemic type change in the pons and hemispheric white matter. No evidence of hemorrhage, hydrocephalus, or collection. Vascular: Preserved flow voids. Skull and upper cervical spine: Normal marrow signal Sinuses/Orbits: Negative Other: Significant and progressive motion artifact MRA HEAD FINDINGS Very motion degraded. Carotid, vertebral, and basilar arteries and major branches are patent with gross symmetry. IMPRESSION: Brain MRI: 1. 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 These are not in a unified arterial or venous distribution and are of uncertain cause. No revealing background chronic brain findings. 2.  Significantly motion degraded Intracranial MRA: Very motion degraded and of limited utility other than documenting patency of major vessels. Electronically Signed   By: Marnee SpringJonathon  Watts M.D.   On: 09/18/2020 08:43   MR BRAIN WO CONTRAST  Result Date: 09/18/2020 CLINICAL DATA:  Altered mental status. EXAM: MRI HEAD WITHOUT CONTRAST MRA HEAD WITHOUT CONTRAST TECHNIQUE: Multiplanar, multi-echo pulse sequences of the brain and surrounding structures were acquired without intravenous contrast. Angiographic images of the Circle of Willis were acquired using MRA technique without intravenous contrast. COMPARISON:  No pertinent prior exam. FINDINGS: MRI HEAD FINDINGS Brain: Restricted diffusion in the bilateral thalamus, bilateral internal capsule, left centrum semiovale, and right subcortical frontal. These have the appearance of acute infarcts. Mild small vessel ischemic type change in the pons and hemispheric white matter. No evidence of hemorrhage, hydrocephalus, or collection. Vascular: Preserved flow voids. Skull and upper cervical spine: Normal marrow signal Sinuses/Orbits: Negative Other: Significant and progressive motion artifact MRA HEAD FINDINGS Very motion degraded. Carotid, vertebral, and basilar arteries and major branches are patent with gross symmetry. IMPRESSION: Brain MRI: 1. 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 These are not in a unified arterial or venous distribution and are of uncertain cause. No revealing background chronic brain findings. 2. Significantly motion degraded Intracranial MRA: Very motion degraded and of limited utility other than documenting patency of major vessels. Electronically Signed   By: Marnee SpringJonathon  Watts M.D.   On: 09/18/2020 08:43   IR GASTROSTOMY  TUBE MOD SED  Result Date: 10/04/2020 INDICATION: Altered mental status and dysphagia. Request made for gastrostomy tube placement for  enteric nutrition supplementation purposes Note, patient underwent placement of a push gastrostomy tube on 09/22/2020 by Dr. Lowella Dandy however the tube was inadvertently removed After prolonged conversation with the patient's family regarding goals of care decision was made to proceed with repeat definitive gastrostomy tube placement. EXAM: PULL TROUGH GASTROSTOMY TUBE PLACEMENT COMPARISON:  Image guided placement of a post gastrostomy tube-09/22/2020 CT abdomen and pelvis-09/21/2020 MEDICATIONS: Patient is admitted to the hospital and receiving intravenous antibiotics.; Antibiotics were administered within 1 hour of the procedure. Glucagon 1 mg IV CONTRAST:  20 mL of Omnipaque 300 administered into the gastric lumen. ANESTHESIA/SEDATION: Moderate (conscious) sedation was employed during this procedure. A total of Versed 1.5 mg and Fentanyl 50 mcg was administered intravenously. Moderate Sedation Time: 11 minutes. The patient's level of consciousness and vital signs were monitored continuously by radiology nursing throughout the procedure under my direct supervision. FLUOROSCOPY TIME:  4 minutes, 6 seconds (70 mGy) COMPLICATIONS: None immediate. PROCEDURE: Informed written consent was obtained from the patient's family following explanation of the procedure, risks, benefits and alternatives. A time out was performed prior to the initiation of the procedure. On physical inspection, it was clear that the prior gastrostomy tube was completely healed and not amenable to attempted percutaneous recanalization. As such the decision was made to proceed with definitive placement of a new now pull-through gastrostomy tube (previously, a push gastrostomy tube was placed secondary to patient's inability to cooperate with the procedure and open her mouth). Ultrasound scanning was performed to demarcate the edge of the left lobe of the liver. Maximal barrier sterile technique utilized including caps, mask, sterile gowns, sterile  gloves, large sterile drape, hand hygiene and Betadine prep. The left upper quadrant was sterilely prepped and draped. An oral gastric catheter was inserted into the stomach under fluoroscopy. The existing nasogastric feeding tube was removed. The left costal margin and air opacified transverse colon were identified and avoided. Air was injected into the stomach for insufflation and visualization under fluoroscopy. Under sterile conditions a 17 gauge trocar needle was utilized to access the stomach percutaneously beneath the left subcostal margin after the overlying soft tissues were anesthetized with 1% Lidocaine with epinephrine. Needle position was confirmed within the stomach with aspiration of air and injection of small amount of contrast. A single T tack was deployed for gastropexy. Over an Amplatz guide wire, a 9-French sheath was inserted into the stomach. A snare device was utilized to capture the oral gastric catheter. The snare device was pulled retrograde from the stomach up the esophagus and out the oropharynx. The 20-French pull-through gastrostomy was connected to the snare device and pulled antegrade through the oropharynx down the esophagus into the stomach and then through the percutaneous tract external to the patient. The gastrostomy was assembled externally. Contrast injection confirms appropriate positioning within the stomach. Several spot radiographic images were obtained in various obliquities for documentation. Dressings were applied. The patient tolerated procedure well without immediate post procedural complication. FINDINGS: After successful fluoroscopic guided placement, the gastrostomy tube is appropriately positioned with internal disc positioned against the inner ventral wall of the gastric lumen. IMPRESSION: Successful fluoroscopic insertion of a 20-French pull-through gastrostomy tube. The gastrostomy may be used immediately for medication administration and in 24 hrs for the  initiation of feeds. Electronically Signed   By: Simonne Come M.D.   On: 10/04/2020 12:17   IR GASTROSTOMY  TUBE MOD SED  Result Date: 09/22/2020 INDICATION: 65 year old with altered mental status and dysphagia. Request for gastrostomy tube placement. EXAM: PERCUTANEOUS GASTROSTOMY TUBE WITH FLUOROSCOPIC GUIDANCE Physician: Rachelle Hora. Lowella Dandy, MD MEDICATIONS: Ancef 2 g; Antibiotics were administered within 1 hour of the procedure. Glucagon 0.5 mg ANESTHESIA/SEDATION: Versed 2.0 mg IV; Fentanyl 100 mcg IV Moderate Sedation Time:  42 minutes The patient was continuously monitored during the procedure by the interventional radiology nurse under my direct supervision. FLUOROSCOPY TIME:  Fluoroscopy Time: 12 minutes, 6 seconds, 19 mGy CONTRAST:  20 mL Omnipaque 300 COMPLICATIONS: None immediate. PROCEDURE: Informed consent was obtained for a percutaneous gastrostomy tube. The patient was placed on the interventional table. An orogastric tube was placed with fluoroscopic guidance. The anterior abdomen was prepped and draped in sterile fashion. Maximal barrier sterile technique was utilized including caps, mask, sterile gowns, sterile gloves, sterile drape, hand hygiene and skin antiseptic. Stomach was inflated with air through the orogastric tube. The skin and subcutaneous tissues were anesthetized with 1% lidocaine. A 17 gauge needle was directed into the distended stomach with fluoroscopic guidance. A wire was advanced into the stomach and a T fastener was deployed. A 9-French vascular sheath was placed and the orogastric tube was snared using a [place] snare device. At this time, the patient was clenching her teeth and we could not open her mouth in order to place the pull-through gastrostomy tube. As a result, we converted to a push through gastrostomy tube. Skin around the 9 French drain was anesthetized with 1% lidocaine. Using fluoroscopic guidance, 2 additional T-fasteners were deployed. The T fastener associated  with the 9 French sheath was cut. The 9 French sheath was removed and dilated up to an 18 [place] peel-away sheath. A 16 French Entuit gastrostomy tube was advanced over the wire and through the peel-away sheath. The balloon was inflated with approximately 8 mL of saline. Peel-away sheath and wire were removed. Contrast injection confirmed placement in the stomach. Fluoroscopic images were obtained for documentation. The gastrostomy tube was flushed with normal saline. FINDINGS: 63 French balloon retention gastrostomy tube is positioned in the stomach. Total of 3 T-fasteners were deployed and there are still 2 T-fasteners intact. Contrast injection confirmed placement in the stomach. IMPRESSION: 1. Successful placement of a percutaneous gastrostomy tube using fluoroscopy. This is a balloon retention gastrostomy tube. 2. There are 2 remaining T-fasteners in place. Plan to cut the T-fasteners in 7-10 days. Electronically Signed   By: Richarda Overlie M.D.   On: 09/22/2020 12:07   US Carotid Bilateral (at [facility] and [facility] only)  Result Date: 09/18/2020 CLINICAL DATA:  Altered mental status Hypertension Stroke EXAM: BILATERAL CAROTID DUPLEX ULTRASOUND TECHNIQUE: Wallace Cullens scale imaging, color Doppler and duplex ultrasound were performed of bilateral carotid and vertebral arteries in the neck. COMPARISON:  None. FINDINGS: Criteria: Quantification of carotid stenosis is based on velocity parameters that correlate the residual internal carotid diameter with NASCET-based stenosis levels, using the diameter of the distal internal carotid lumen as the denominator for stenosis measurement. The following velocity measurements were obtained: RIGHT ICA: 156/29 cm/sec CCA: 77/11 cm/sec SYSTOLIC ICA/CCA RATIO:  2.0 ECA: 173 cm/sec LEFT ICA: 83/18 cm/sec CCA: 85/14 cm/sec SYSTOLIC ICA/CCA RATIO:  1.0 ECA: 92 cm/sec RIGHT CAROTID ARTERY: Heterogeneous calcified and noncalcified plaque of the distal common and proximal internal carotid arteries  with velocity indicative of 50-69% stenosis. RIGHT VERTEBRAL ARTERY:  Antegrade flow. LEFT CAROTID ARTERY: Heterogeneous calcified and noncalcified plaque of the distal common and proximal internal  carotid arteries with velocity parameters indicative of less than 50% stenosis. LEFT VERTEBRAL ARTERY:  Antegrade flow. IMPRESSION: 1. 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 2. Less than 50% stenosis of the left internal carotid artery. 3. Long segment shadowing plaque present in both common and internal carotid arteries, which can obscure higher velocities. Further evaluation with MRI or CT of the neck should be performed for more precise evaluation of degree of stenosis. Electronically Signed   By: Acquanetta Belling M.D.   On: 09/18/2020 10:01   DG CHEST PORT 1 VIEW  Result Date: 10/01/2020 CLINICAL DATA:  Altered mental status, leukocytosis.  Former smoker. EXAM: PORTABLE CHEST 1 VIEW COMPARISON:  Chest x-ray dated 09/17/2020. FINDINGS: Heart size and mediastinal contours are stable. Lungs are hyperexpanded. Coarse lung markings are again seen bilaterally without significant interval change. No new lung findings. No pleural effusion or pneumothorax is seen. Osseous structures about the chest are unremarkable. IMPRESSION: 1. No active disease. No evidence of pneumonia or pulmonary edema. 2. Hyperexpanded lungs indicating COPD. 3. Chronic interstitial lung disease. Electronically Signed   By: Bary Richard M.D.   On: 10/01/2020 09:44   DG Chest Portable 1 View  Result Date: 09/17/2020 CLINICAL DATA:  Altered mental status EXAM: PORTABLE CHEST 1 VIEW COMPARISON:  None FINDINGS: There is hyperinflation of the lungs compatible with COPD. Heart and mediastinal contours are within normal limits. No focal opacities or effusions. No acute bony abnormality. IMPRESSION: COPD.  No active disease. Electronically Signed   By: Charlett Nose M.D.   On: 09/17/2020 23:02   DG Swallowing Func-Speech Pathology  Result  Date: 10/03/2020 Formatting of this result is different from the original. Objective Swallowing Evaluation: Type of Study: MBS-Modified Barium Swallow Study  Patient Details Name: Kendra Lee MRN: [id number] Date of Birth: 02-05-1956 Today's Date: 10/03/2020 Time: SLP Start Time (ACUTE ONLY): 1436 -SLP Stop Time (ACUTE ONLY): 1515 SLP Time Calculation (min) (ACUTE ONLY): 39 min Past Medical History: Past Medical History: Diagnosis Date  Smoker   2 ppd x 55 years Past Surgical History: Past Surgical History: Procedure Laterality Date  IR GASTROSTOMY TUBE MOD SED  09/22/2020 HPI: 65 y.o. female  with past medical history of no known medical history admitted on 09/17/2020 with acute metabolic encephalopathy, CVA acute embolic stroke. MRI shows Scattered small vessel infarcts in the bilateral thalamus,   bilateral internal capsule, and bilateral hemispheric white matter. Pt s/p PEG on Friday due to severity of recent stroke with dysphagia.  Subjective: "squash" when asked what she grows in the garden Assessment / Plan / Recommendation CHL IP CLINICAL IMPRESSIONS 10/03/2020 Clinical Impression Pt presents with moderate/severe sensorimotor oropharyngeal dysphagia characterized by gross oral weakness and trace silent aspiration of thin liquids and NTL; swallowing function is negatively impacted by fluctuating alertness. Note anterior spillage with all liquids presented and prolonged AP transit and delayed swallowing trigger, triggered at the level of the valleculae. With thin liquids and NTL, note trace silent aspiration before and during the swallow; No reflexive cough and cued cough is VERY weak and not effective in clearing aspirates. With HTL note one episode of deep flash penetration to the cords. Pt consumed Puree textures (requires feeder and cues to open mouth wide enough to insert spoon for bolus), with prolonged oral prep, lingual pumping and a delayed swallowing trigger at the level of the vallecuale; laryngeal  vestibule closure is good with solids and pharyngeal squeeze is adequate with trace residuals after the swallow. Note trials  of each texture/consistency were very limited to 1-3 presentations of each secondary to fatigue. Recommend alternative means of nutrition and initiate dysphagia therapy and trials of D1/puree and tsp HTL with SLP only. Recommend repeat MBS when clinically appropriate. ST will continue to follow SLP Visit Diagnosis Dysphagia, unspecified (R13.10) Attention and concentration deficit following -- Frontal lobe and executive function deficit following -- Impact on safety and function Severe aspiration risk;Risk for inadequate nutrition/hydration   CHL IP TREATMENT RECOMMENDATION 10/03/2020 Treatment Recommendations Therapy as outlined in treatment plan below   Prognosis 10/03/2020 Prognosis for Safe Diet Advancement Guarded Barriers to Reach Goals Severity of deficits Barriers/Prognosis Comment -- CHL IP DIET RECOMMENDATION 10/03/2020 SLP Diet Recommendations NPO;Alternative means - long-term;Ice chips PRN after oral care Liquid Administration via Spoon Medication Administration Via alternative means Compensations Slow rate;Small sips/bites Postural Changes Seated upright at 90 degrees   CHL IP OTHER RECOMMENDATIONS 10/03/2020 Recommended Consults -- Oral Care Recommendations Oral care BID Other Recommendations Have oral suction available   CHL IP FOLLOW UP RECOMMENDATIONS 10/03/2020 Follow up Recommendations Skilled Nursing facility   [facility] IP FREQUENCY AND DURATION 10/03/2020 Speech Therapy Frequency (ACUTE ONLY) min 3x week Treatment Duration 2 weeks      CHL IP ORAL PHASE 10/03/2020 Oral Phase Impaired Oral - Pudding Teaspoon -- Oral - Pudding Cup -- Oral - Honey Teaspoon NT Oral - Honey Cup Left anterior bolus loss;Right anterior bolus loss;Impaired mastication;Weak lingual manipulation;Reduced posterior propulsion;Holding of bolus;Lingual/palatal residue;Piecemeal swallowing;Delayed oral  transit;Decreased bolus cohesion;Premature spillage Oral - Nectar Teaspoon Left anterior bolus loss;Right anterior bolus loss;Impaired mastication;Weak lingual manipulation;Reduced posterior propulsion;Holding of bolus;Lingual/palatal residue;Piecemeal swallowing;Delayed oral transit;Decreased bolus cohesion;Premature spillage Oral - Nectar Cup Left anterior bolus loss;Right anterior bolus loss;Impaired mastication;Weak lingual manipulation;Reduced posterior propulsion;Holding of bolus;Lingual/palatal residue;Piecemeal swallowing;Delayed oral transit;Decreased bolus cohesion;Premature spillage Oral - Nectar Straw NT Oral - Thin Teaspoon Left anterior bolus loss;Right anterior bolus loss;Impaired mastication;Weak lingual manipulation;Reduced posterior propulsion;Holding of bolus;Lingual/palatal residue;Piecemeal swallowing;Delayed oral transit;Decreased bolus cohesion;Premature spillage Oral - Thin Cup Left anterior bolus loss;Right anterior bolus loss;Impaired mastication;Weak lingual manipulation;Reduced posterior propulsion;Holding of bolus;Lingual/palatal residue;Piecemeal swallowing;Delayed oral transit;Decreased bolus cohesion;Premature spillage Oral - Thin Straw NT Oral - Puree Weak lingual manipulation;Lingual pumping;Reduced posterior propulsion;Holding of bolus;Lingual/palatal residue;Piecemeal swallowing;Delayed oral transit;Decreased bolus cohesion Oral - Mech Soft NT Oral - Regular NT Oral - Multi-Consistency NT Oral - Pill NT Oral Phase - Comment --  CHL IP PHARYNGEAL PHASE 10/03/2020 Pharyngeal Phase Impaired Pharyngeal- Pudding Teaspoon -- Pharyngeal -- Pharyngeal- Pudding Cup -- Pharyngeal -- Pharyngeal- Honey Teaspoon Reduced airway/laryngeal closure;Penetration/Aspiration during swallow;Pharyngeal residue - valleculae;Pharyngeal residue - pyriform;Reduced epiglottic inversion Pharyngeal -- Pharyngeal- Honey Cup Reduced airway/laryngeal closure;Penetration/Aspiration during swallow;Pharyngeal residue  - valleculae;Pharyngeal residue - pyriform;Reduced epiglottic inversion Pharyngeal -- Pharyngeal- Nectar Teaspoon NT Pharyngeal -- Pharyngeal- Nectar Cup Reduced airway/laryngeal closure;Penetration/Aspiration during swallow;Pharyngeal residue - valleculae;Pharyngeal residue - pyriform;Reduced epiglottic inversion;Trace aspiration Pharyngeal Material enters airway, passes BELOW cords without attempt by patient to eject out (silent aspiration) Pharyngeal- Nectar Straw NT Pharyngeal -- Pharyngeal- Thin Teaspoon Reduced airway/laryngeal closure;Penetration/Aspiration during swallow;Pharyngeal residue - valleculae;Pharyngeal residue - pyriform;Reduced epiglottic inversion;Trace aspiration Pharyngeal Material enters airway, passes BELOW cords without attempt by patient to eject out (silent aspiration) Pharyngeal- Thin Cup Reduced airway/laryngeal closure;Penetration/Aspiration during swallow;Pharyngeal residue - valleculae;Pharyngeal residue - pyriform;Reduced epiglottic inversion Pharyngeal Material enters airway, passes BELOW cords without attempt by patient to eject out (silent aspiration) Pharyngeal- Thin Straw NT Pharyngeal -- Pharyngeal- Puree -- Pharyngeal -- Pharyngeal- Mechanical Soft -- Pharyngeal -- Pharyngeal- Regular -- Pharyngeal -- Pharyngeal- Multi-consistency --  Pharyngeal -- Pharyngeal- Pill -- Pharyngeal -- Pharyngeal Comment --  CHL IP CERVICAL ESOPHAGEAL PHASE 10/03/2020 Cervical Esophageal Phase WFL Pudding Teaspoon -- Pudding Cup -- Honey Teaspoon -- Honey Cup -- Nectar Teaspoon -- Nectar Cup -- Nectar Straw -- Thin Teaspoon -- Thin Cup -- Thin Straw -- Puree -- Mechanical Soft -- Regular -- Multi-consistency -- Pill -- Cervical Esophageal Comment -- Amelia [name], CCC-SLP Speech Language Pathologist [name] 10/03/2020, 3:51 PM              ECHOCARDIOGRAM COMPLETE  Result Date: 09/18/2020    ECHOCARDIOGRAM REPORT   Patient Name:   [facility] Date of Exam: 09/18/2020  Medical Rec #:  [id number]       Height: Accession #:    [id number]      Weight: Date of Birth:  29-Aug-1955       BSA: Patient Age:    64 years        BP:           213/100 mmHg Patient Gender: F               HR:           59 bpm. [facility] Procedure: 2D Echo, Cardiac Doppler and Color Doppler Indications:    Stroke  History:        Patient has no prior history of Echocardiogram examinations.                 Stroke; Risk Factors:Hypertension. Height and Weight not                 available.  Sonographer:    Mikki Harbor Referring Phys: [id number] ASIA B ZIERLE-GHOSH  Sonographer Comments: Height and Weight not available IMPRESSIONS  1. Left ventricular ejection fraction, by estimation, is 60 to 65%. The left ventricle has normal function. The left ventricle has no regional wall motion abnormalities. There is mild left ventricular hypertrophy. Left ventricular diastolic parameters were normal.  2. Right ventricular systolic function is normal. The right ventricular size is normal. Tricuspid regurgitation signal is inadequate for assessing PA pressure.  3. The mitral valve is grossly normal. Trivial mitral valve regurgitation.  4. The aortic valve is tricuspid. Aortic valve regurgitation is not visualized. Mild aortic valve sclerosis is present, with no evidence of aortic valve stenosis. Aortic valve mean gradient measures 3.0 mmHg.  5. The inferior vena cava is normal in size with greater than 50% respiratory variability, suggesting right atrial pressure of 3 mmHg. FINDINGS  Left Ventricle: Left ventricular ejection fraction, by estimation, is 60 to 65%. The left ventricle has normal function. The left ventricle has no regional wall motion abnormalities. The left ventricular internal cavity size was normal in size. There is  mild left ventricular hypertrophy. Left ventricular diastolic parameters were normal. Right Ventricle: The right ventricular size is normal. No increase in right ventricular wall  thickness. Right ventricular systolic function is normal. Tricuspid regurgitation signal is inadequate for assessing PA pressure. Left Atrium: Left atrial size was normal in size. Right Atrium: Right atrial size was normal in size. Pericardium: There is no evidence of pericardial effusion. Presence of pericardial fat pad. Mitral Valve: The mitral valve is grossly normal. Mild mitral annular calcification. Trivial mitral valve regurgitation. MV peak gradient, 2.7 mmHg. The mean mitral valve gradient is 1.0 mmHg. Tricuspid Valve: The tricuspid valve is grossly normal. Tricuspid valve regurgitation is trivial. Aortic Valve: The aortic valve is  tricuspid. There is mild aortic valve annular calcification. Aortic valve regurgitation is not visualized. Mild aortic valve sclerosis is present, with no evidence of aortic valve stenosis. Aortic valve mean gradient measures 3.0 mmHg. Aortic valve peak gradient measures 5.7 mmHg. Aortic valve area, by VTI measures 2.71 cm. Pulmonic Valve: The pulmonic valve was grossly normal. Pulmonic valve regurgitation is trivial. Aorta: The aortic root is normal in size and structure. Venous: The inferior vena cava is normal in size with greater than 50% respiratory variability, suggesting right atrial pressure of 3 mmHg. IAS/Shunts: The interatrial septum appears to be lipomatous. No atrial level shunt detected by color flow Doppler.  LEFT VENTRICLE PLAX 2D LVIDd:         4.38 cm  Diastology LVIDs:         2.91 cm  LV e' medial:    7.10 cm/s LV PW:         1.04 cm  LV E/e' medial:  11.9 LV IVS:        1.06 cm  LV e' lateral:   7.62 cm/s LVOT diam:     2.00 cm  LV E/e' lateral: 11.0 LV SV:         90 LVOT Area:     3.14 cm  RIGHT VENTRICLE RV Basal diam:  3.37 cm RV Mid diam:    3.06 cm RV S prime:     10.20 cm/s TAPSE (M-mode): 2.3 cm LEFT ATRIUM             RIGHT ATRIUM LA diam:        3.00 cm RA Area:     13.30 cm LA Vol (A2C):   49.5 ml RA Volume:   31.30 ml LA Vol (A4C):   28.7 ml LA  Biplane Vol: 40.5 ml  AORTIC VALVE AV Area (Vmax):    2.85 cm AV Area (Vmean):   2.55 cm AV Area (VTI):     2.71 cm AV Vmax:           119.00 cm/s AV Vmean:          86.100 cm/s AV VTI:            0.330 m AV Peak Grad:      5.7 mmHg AV Mean Grad:      3.0 mmHg LVOT Vmax:         108.00 cm/s LVOT Vmean:        69.800 cm/s LVOT VTI:          0.285 m LVOT/AV VTI ratio: 0.86  AORTA Ao Root diam: 3.30 cm MITRAL VALVE MV Area (PHT): 3.15 cm    SHUNTS MV Area VTI:   2.54 cm    Systemic VTI:  0.29 m MV Peak grad:  2.7 mmHg    Systemic Diam: 2.00 cm MV Mean grad:  1.0 mmHg MV Vmax:       0.81 m/s MV Vmean:      41.2 cm/s MV Decel Time: 241 msec MV E velocity: 84.20 cm/s MV A velocity: 72.70 cm/s MV E/A ratio:  1.16 Nona Dell MD Electronically signed by Nona Dell MD Signature Date/Time: 09/18/2020/2:03:27 PM    Final      Discharge Exam: Vitals:   10/08/20 0500 10/08/20 0855  BP: 140/64 (!) 150/65  Pulse: 82   Resp: 16   Temp: 98 F (36.7 C)   SpO2: 97%    Vitals:   10/07/20 1552 10/07/20 2037 10/08/20 0500 10/08/20 0855  BP: 136/69  136/65 140/64 (!) 150/65  Pulse:  85 82   Resp:  16 16   Temp:  97.7 F (36.5 C) 98 F (36.7 C)   TempSrc:  Oral Oral   SpO2:  95% 97%   Weight:      Height:        General: Pt is alert, awake, not in acute distress Cardiovascular: RRR, S1/S2 +, no rubs, no gallops Respiratory: CTA bilaterally, no wheezing, no rhonchi Abdominal: Soft, NT, ND, bowel sounds +, PEG c/d/i Extremities: no edema, no cyanosis    The results of significant diagnostics from this hospitalization (including imaging, microbiology, ancillary and laboratory) are listed below for reference.     Microbiology: Recent Results (from the past 240 hour(s))  Culture, blood (Routine X 2) w Reflex to ID Panel     Status: None   Collection Time: 10/03/20  2:09 PM   Specimen: BLOOD RIGHT HAND  Result Value Ref Range Status   Specimen Description   Final    BLOOD RIGHT HAND  BOTTLES DRAWN AEROBIC AND ANAEROBIC   Special Requests Blood Culture adequate volume  Final   Culture   Final    NO GROWTH 5 DAYS Performed at [facility], [address] [place], [place] [address]    Report Status 10/08/2020 FINAL  Final  Culture, blood (Routine X 2) w Reflex to ID Panel     Status: None   Collection Time: 10/03/20  4:07 PM   Specimen: BLOOD  Result Value Ref Range Status   Specimen Description BLOOD  Final   Special Requests NONE  Final   Culture   Final    NO GROWTH 5 DAYS Performed at [facility], [address] [place], [place] [address]    Report Status 10/08/2020 FINAL  Final  MRSA Next Gen by PCR, Nasal     Status: None   Collection Time: 10/04/20  5:42 PM   Specimen: Nasal Mucosa; Nasal Swab  Result Value Ref Range Status   MRSA by PCR Next Gen NOT DETECTED NOT DETECTED Final    Comment: (NOTE) The GeneXpert MRSA Assay (FDA approved for NASAL specimens only), is one component of a comprehensive MRSA colonization surveillance program. It is not intended to diagnose MRSA infection nor to guide or monitor treatment for MRSA infections. Test performance is not FDA approved in patients less than 24 years old. Performed at [facility], [address] [place], [place] [address]   Culture, blood (routine x 2)     Status: None (Preliminary result)   Collection Time: 10/04/20  6:08 PM   Specimen: BLOOD RIGHT HAND  Result Value Ref Range Status   Specimen Description BLOOD RIGHT HAND  Final   Special Requests   Final    BOTTLES DRAWN AEROBIC AND ANAEROBIC Blood Culture adequate volume   Culture   Final    NO GROWTH 4 DAYS Performed at [facility], [address] [place], [place] [address]    Report Status PENDING  Incomplete  Culture, blood (routine x 2)     Status: None (Preliminary result)   Collection Time: 10/04/20  6:08 PM   Specimen: BLOOD LEFT HAND  Result Value Ref Range Status   Specimen Description BLOOD LEFT HAND  Final   Special Requests    Final    BOTTLES DRAWN AEROBIC AND ANAEROBIC Blood Culture adequate volume   Culture   Final    NO GROWTH 4 DAYS Performed at [facility], [address], [place],  [place] [address]    Report Status PENDING  Incomplete  Urine Culture     Status: None   Collection Time: 10/05/20 12:00 PM   Specimen: Urine, Clean Catch  Result Value Ref Range Status   Specimen Description   Final    URINE, CLEAN CATCH Performed at [facility], [address] [place], [place] [address]    Special Requests   Final    NONE Performed at [facility], [address] [place], [place] [address]    Culture   Final    NO GROWTH Performed at [facility], [address], [place], [place] [address]    Report Status 10/07/2020 FINAL  Final     Labs: BNP (last 3 results) No results for input(s): BNP in the last 8760 hours. Basic Metabolic Panel: Recent Labs  Lab 10/04/20 0410 10/05/20 0544 10/06/20 0534 10/07/20 0537 10/08/20 0505  NA 143 138 137 134* 133*  K 2.9* 4.5 3.1* 3.5 3.7  CL 112* 111 106 101 97*  CO2 23 22 25 27 30   GLUCOSE 113* 211* 121* 128* 126*  BUN 13 12 13 10 11   CREATININE 0.59 0.64 0.64 0.56 0.51  CALCIUM 8.0* 7.2* 8.1* 8.1* 8.1*  MG 1.9 1.7 1.7 1.6* 2.1   Liver Function Tests: No results for input(s): AST, ALT, ALKPHOS, BILITOT, PROT, ALBUMIN in the last 168 hours. No results for input(s): LIPASE, AMYLASE in the last 168 hours. Recent Labs  Lab 10/07/20 1234  AMMONIA 16   CBC: Recent Labs  Lab 10/02/20 0620 10/05/20 0544 10/06/20 0534 10/07/20 0537 10/08/20 0505  WBC 9.8 11.2* 11.6* 13.1* 12.8*  NEUTROABS 8.4*  --   --   --   --   HGB 11.8* 9.8* 9.7* 9.8* 9.6*  HCT 35.2* 30.2* 29.4* 30.0* 28.4*  MCV 94.4 96.2 94.2 93.5 92.5  PLT 395 334 333 334 343   Cardiac Enzymes: No results for input(s): CKTOTAL, CKMB, CKMBINDEX, TROPONINI in the last 168 hours. BNP: Invalid input(s): POCBNP CBG: Recent Labs  Lab 10/04/20 1149 10/06/20 0745 10/06/20 1617  10/07/20 1058  GLUCAP 134* 130* 139* 128*   D-Dimer No results for input(s): DDIMER in the last 72 hours. Hgb A1c No results for input(s): HGBA1C in the last 72 hours. Lipid Profile No results for input(s): CHOL, HDL, LDLCALC, TRIG, CHOLHDL, LDLDIRECT in the last 72 hours. Thyroid function studies No results for input(s): TSH, T4TOTAL, T3FREE, THYROIDAB in the last 72 hours.  Invalid input(s): FREET3 Anemia work up No results for input(s): VITAMINB12, FOLATE, FERRITIN, TIBC, IRON, RETICCTPCT in the last 72 hours. Urinalysis    Component Value Date/Time   COLORURINE YELLOW 10/05/2020 1200   APPEARANCEUR HAZY (A) 10/05/2020 1200   LABSPEC 1.017 10/05/2020 1200   PHURINE 5.0 10/05/2020 1200   GLUCOSEU NEGATIVE 10/05/2020 1200   HGBUR MODERATE (A) 10/05/2020 1200   BILIRUBINUR NEGATIVE 10/05/2020 1200   KETONESUR NEGATIVE 10/05/2020 1200   PROTEINUR NEGATIVE 10/05/2020 1200   NITRITE NEGATIVE 10/05/2020 1200   LEUKOCYTESUR LARGE (A) 10/05/2020 1200   Sepsis Labs Invalid input(s): PROCALCITONIN,  WBC,  LACTICIDVEN Microbiology Recent Results (from the past 240 hour(s))  Culture, blood (Routine X 2) w Reflex to ID Panel     Status: None   Collection Time: 10/03/20  2:09 PM   Specimen: BLOOD RIGHT HAND  Result Value Ref Range Status   Specimen Description   Final    BLOOD RIGHT HAND BOTTLES DRAWN AEROBIC AND ANAEROBIC   Special Requests  Blood Culture adequate volume  Final   Culture   Final    NO GROWTH 5 DAYS Performed at [facility], [address], [place], [place] [address]    Report Status 10/08/2020 FINAL  Final  Culture, blood (Routine X 2) w Reflex to ID Panel     Status: None   Collection Time: 10/03/20  4:07 PM   Specimen: BLOOD  Result Value Ref Range Status   Specimen Description BLOOD  Final   Special Requests NONE  Final   Culture   Final    NO GROWTH 5 DAYS Performed at [facility], [address] [place], [place] [address]    Report Status 10/08/2020  FINAL  Final  MRSA Next Gen by PCR, Nasal     Status: None   Collection Time: 10/04/20  5:42 PM   Specimen: Nasal Mucosa; Nasal Swab  Result Value Ref Range Status   MRSA by PCR Next Gen NOT DETECTED NOT DETECTED Final    Comment: (NOTE) The GeneXpert MRSA Assay (FDA approved for NASAL specimens only), is one component of a comprehensive MRSA colonization surveillance program. It is not intended to diagnose MRSA infection nor to guide or monitor treatment for MRSA infections. Test performance is not FDA approved in patients less than 79 years old. Performed at [facility], [address], [place], [place] [address]   Culture, blood (routine x 2)     Status: None (Preliminary result)   Collection Time: 10/04/20  6:08 PM   Specimen: BLOOD RIGHT HAND  Result Value Ref Range Status   Specimen Description BLOOD RIGHT HAND  Final   Special Requests   Final    BOTTLES DRAWN AEROBIC AND ANAEROBIC Blood Culture adequate volume   Culture   Final    NO GROWTH 4 DAYS Performed at [facility], [address], [place], [place] [address]    Report Status PENDING  Incomplete  Culture, blood (routine x 2)     Status: None (Preliminary result)   Collection Time: 10/04/20  6:08 PM   Specimen: BLOOD LEFT HAND  Result Value Ref Range Status   Specimen Description BLOOD LEFT HAND  Final   Special Requests   Final    BOTTLES DRAWN AEROBIC AND ANAEROBIC Blood Culture adequate volume   Culture   Final    NO GROWTH 4 DAYS Performed at [facility], [address] [place], [place] [address]    Report Status PENDING  Incomplete  Urine Culture     Status: None   Collection Time: 10/05/20 12:00 PM   Specimen: Urine, Clean Catch  Result Value Ref Range Status   Specimen Description   Final    URINE, CLEAN CATCH Performed at [facility], [address], [place], [place] [address]    Special Requests   Final    NONE Performed at [facility], [address], [place], [place] [address]    Culture    Final    NO GROWTH Performed at [facility], [address], [place], [place] [address]    Report Status 10/07/2020 FINAL  Final     Time coordinating discharge: 35 minutes  SIGNED:   Erick Blinks, DO [facility] 10/08/2020, 10:33 AM  If 7PM-7AM, please contact night-coverage [email or web address]

## 2020-10-09 LAB — CULTURE, BLOOD (ROUTINE X 2)
Culture: NO GROWTH
Culture: NO GROWTH
Special Requests: ADEQUATE
Special Requests: ADEQUATE

## 2020-10-09 NOTE — Care Management Important Message (Signed)
Important Message  Patient Details  Name: Kendra Lee MRN: [id number] Date of Birth: [birth year]   Medicare Important Message Given:  Yes (son is aware, copy left in room)     Corey Harold 10/09/2020, 11:12 AM

## 2020-10-09 NOTE — TOC Transition Note (Signed)
Transition of Care [facility]) - CM/SW Discharge Note   Patient Details  Name: Kendra Lee MRN: [id number] Date of Birth: Mar 28, 1955  Transition of Care [facility]) CM/SW Contact:  Leitha Bleak, RN Phone Number: 10/09/2020, 11:34 AM   Clinical Narrative:   [facility] is ready for patient today. Debbie provided number for report and room # B17-2. RN to call report. Medical necessity printed on 300. TOC will call EMS when RN is ready.   Final next level of care: Skilled Nursing Facility Barriers to Discharge: Barriers Resolved   Patient Goals and CMS Choice Patient states their goals for this hospitalization and ongoing recovery are:: to go to rehab. CMS Medicare.gov Compare Post Acute Care list provided to:: Patient Represenative (must comment) Choice offered to / list presented to : Adult Children  Discharge Placement              Patient chooses bed at:  [facility]) Patient to be transferred to facility by: EMS Name of family member notified: Ramon Dredge - son Patient and family notified of of transfer: 10/09/20   Readmission Risk Interventions Readmission Risk Prevention Plan 10/09/2020 09/20/2020  Medication Screening - Complete  Transportation Screening Complete Complete  PCP or Specialist Appt within 5-7 Days Complete -  Home Care Screening Complete -  Medication Review (RN CM) Complete -

## 2020-10-09 NOTE — Progress Notes (Signed)
Patient seen and evaluated this a.m. with no new concerns noted.  No acute events noted overnight.  Tube feedings are ongoing.  Please refer to discharge summary dictated 7/17 for full details.  She has had a delay in transfer to SNF due to roommate having COVID illness.  Total care time: 15 minutes.

## 2020-10-10 ENCOUNTER — Ambulatory Visit: Payer: Medicare Other

## 2020-10-10 ENCOUNTER — Other Ambulatory Visit: Payer: Self-pay | Admitting: Internal Medicine

## 2020-10-10 DIAGNOSIS — I639 Cerebral infarction, unspecified: Secondary | ICD-10-CM

## 2020-10-11 LAB — GLUCOSE, CAPILLARY: Glucose-Capillary: 121 mg/dL — ABNORMAL HIGH (ref 70–99)

## 2020-10-16 NOTE — Progress Notes (Signed)
Code stroke times 1049 call time 1106 exam started 1110 exam finished 1114 images sent to [facility] 1118 exam completed 1114 [place] [facility]

## 2020-10-18 ENCOUNTER — Other Ambulatory Visit: Payer: Self-pay

## 2020-10-18 ENCOUNTER — Emergency Department (HOSPITAL_COMMUNITY)
Admission: EM | Admit: 2020-10-18 | Discharge: 2020-10-19 | Disposition: A | Discharge status: Home / Self Care | Payer: Medicare Other | Attending: Emergency Medicine | Admitting: Emergency Medicine

## 2020-10-18 ENCOUNTER — Encounter (HOSPITAL_COMMUNITY): Payer: Self-pay

## 2020-10-18 ENCOUNTER — Emergency Department (HOSPITAL_COMMUNITY): Payer: Medicare Other

## 2020-10-18 DIAGNOSIS — Z23 Encounter for immunization: Secondary | ICD-10-CM | POA: Diagnosis not present

## 2020-10-18 DIAGNOSIS — S0993XA Unspecified injury of face, initial encounter: Secondary | ICD-10-CM | POA: Diagnosis present

## 2020-10-18 DIAGNOSIS — Z7902 Long term (current) use of antithrombotics/antiplatelets: Secondary | ICD-10-CM | POA: Insufficient documentation

## 2020-10-18 DIAGNOSIS — S0081XA Abrasion of other part of head, initial encounter: Secondary | ICD-10-CM

## 2020-10-18 DIAGNOSIS — Z79899 Other long term (current) drug therapy: Secondary | ICD-10-CM | POA: Insufficient documentation

## 2020-10-18 DIAGNOSIS — S0181XA Laceration without foreign body of other part of head, initial encounter: Secondary | ICD-10-CM

## 2020-10-18 DIAGNOSIS — I10 Essential (primary) hypertension: Secondary | ICD-10-CM | POA: Insufficient documentation

## 2020-10-18 DIAGNOSIS — Y9301 Activity, walking, marching and hiking: Secondary | ICD-10-CM | POA: Diagnosis not present

## 2020-10-18 DIAGNOSIS — W01198A Fall on same level from slipping, tripping and stumbling with subsequent striking against other object, initial encounter: Secondary | ICD-10-CM | POA: Diagnosis not present

## 2020-10-18 DIAGNOSIS — S01111A Laceration without foreign body of right eyelid and periocular area, initial encounter: Secondary | ICD-10-CM | POA: Diagnosis not present

## 2020-10-18 DIAGNOSIS — Z7982 Long term (current) use of aspirin: Secondary | ICD-10-CM | POA: Insufficient documentation

## 2020-10-18 DIAGNOSIS — R519 Headache, unspecified: Secondary | ICD-10-CM | POA: Insufficient documentation

## 2020-10-18 DIAGNOSIS — S0990XA Unspecified injury of head, initial encounter: Secondary | ICD-10-CM

## 2020-10-18 DIAGNOSIS — Y92128 Other place in nursing home as the place of occurrence of the external cause: Secondary | ICD-10-CM | POA: Insufficient documentation

## 2020-10-18 DIAGNOSIS — W19XXXA Unspecified fall, initial encounter: Secondary | ICD-10-CM

## 2020-10-18 HISTORY — DX: Hypokalemia: E87.6

## 2020-10-18 HISTORY — DX: Dysphagia, unspecified: R13.10

## 2020-10-18 HISTORY — DX: Cerebral infarction, unspecified: I63.9

## 2020-10-18 HISTORY — DX: Essential (primary) hypertension: I10

## 2020-10-18 MED ORDER — TETANUS-DIPHTH-ACELL PERTUSSIS 5-2.5-18.5 LF-MCG/0.5 IM SUSY
0.5000 mL | PREFILLED_SYRINGE | Freq: Once | INTRAMUSCULAR | Status: AC
  Administered 2020-10-18: 0.5 mL via INTRAMUSCULAR
  Filled 2020-10-18: qty 0.5

## 2020-10-18 MED ORDER — FENTANYL CITRATE (PF) 100 MCG/2ML IJ SOLN
50.0000 ug | Freq: Once | INTRAMUSCULAR | Status: AC
Start: 2020-10-18 — End: 2020-10-18
  Administered 2020-10-18: 50 ug via INTRAMUSCULAR
  Filled 2020-10-18: qty 2

## 2020-10-18 NOTE — ED Notes (Signed)
Pt asleep in hall bed at this time.

## 2020-10-18 NOTE — ED Provider Notes (Signed)
[facility] EMERGENCY DEPARTMENT Provider Note   CSN: [id number] Arrival date & time: 10/18/20  1722     History Chief Complaint  Patient presents with   Fall   Eye Injury    Kendra Lee is a 65 y.o. female.  HPI She presents from her nursing home after a witnessed fall.  Patient was noticed to trip, while walking, and fell striking her right face on the bed rail next to her.  She was transferred by EMS, for evaluation.  The patient complains of a headache.  She knows her location but cannot say what happened.  Level 5 caveat-poor historian/high acuity    Past Medical History:  Diagnosis Date   CVA (cerebral vascular accident) (HCC)    Dysphagia    Hypertension    Hypokalemia    Smoker    2 ppd x 55 years    Patient Active Problem List   Diagnosis Date Noted   Vitamin D deficiency 10/02/2020   Dental caries 10/02/2020   Dysphagia 10/02/2020   Hypokalemia 10/02/2020   Acute metabolic encephalopathy 09/18/2020   CVA (cerebral vascular accident) (HCC) 09/18/2020   Leukocytosis 09/18/2020   Essential hypertension 09/18/2020    Past Surgical History:  Procedure Laterality Date   IR GASTROSTOMY TUBE MOD SED  09/22/2020   IR GASTROSTOMY TUBE MOD SED  10/04/2020     OB History   No obstetric history on file.     No family history on file.     Home Medications Prior to Admission medications   Medication Sig Start Date End Date Taking? Authorizing Provider  amLODipine (NORVASC) 5 MG tablet Place 1 tablet (5 mg total) into feeding tube daily. 09/24/20   Catarina Hartshorn, MD  aspirin 81 MG chewable tablet Place 1 tablet (81 mg total) into feeding tube daily. 09/24/20   Catarina Hartshorn, MD  atorvastatin (LIPITOR) 80 MG tablet Place 1 tablet (80 mg total) into feeding tube daily. 09/24/20   Catarina Hartshorn, MD  clopidogrel (PLAVIX) 75 MG tablet Place 1 tablet (75 mg total) into feeding tube daily. 09/24/20   Catarina Hartshorn, MD  hydrALAZINE (APRESOLINE) 10 MG tablet Take 1 tablet (10 mg  total) by mouth every 8 (eight) hours. 09/24/20   Catarina Hartshorn, MD  losartan (COZAAR) 25 MG tablet Place 1 tablet (25 mg total) into feeding tube daily. 09/24/20   Catarina Hartshorn, MD  Nutritional Supplements (FEEDING SUPPLEMENT, OSMOLITE 1.2 CAL,) LIQD Infuse at 55 cc/hour via pump 09/24/20   Tat, David, MD  thiamine 100 MG tablet Place 1 tablet (100 mg total) into feeding tube daily. 09/24/20   Catarina Hartshorn, MD  Water For Irrigation, Sterile (FREE WATER) SOLN Place 200 mLs into feeding tube every 8 (eight) hours. 09/24/20   Catarina Hartshorn, MD    Allergies    Patient has no known allergies.  Review of Systems   Review of Systems  Unable to perform ROS: Acuity of condition   Physical Exam Updated Vital Signs BP 133/69 (BP Location: Left Arm)   Pulse 86   Temp 97.6 F (36.4 C) (Oral)   Resp 19   Ht  (1.549 m)   Wt 43 kg   LMP  (LMP Unknown)   SpO2 98%   BMI 17.91 kg/m   Physical Exam Vitals and nursing note reviewed.  Constitutional:      General: She is in acute distress.     Appearance: She is well-developed. She is not ill-appearing, [facility] diaphoretic.  HENT:  Head: Normocephalic.     Comments: Right periorbital contusion with swelling both from the eye, and the right cheek.  There is an associated laceration/abrasion, depth not clear on initial evaluation    Right Ear: External ear normal.     Left Ear: External ear normal.  Eyes:     Extraocular Movements: Extraocular movements intact.     Conjunctiva/sclera: Conjunctivae normal.     Pupils: Pupils are equal, round, and reactive to light.  Neck:     Trachea: Phonation normal.  Cardiovascular:     Rate and Rhythm: Normal rate.  Pulmonary:     Effort: Pulmonary effort is normal.  Abdominal:     General: There is no distension.  Musculoskeletal:        General: Normal range of motion.     Cervical back: Normal range of motion and neck supple.     Comments: No large joint deformity or swelling.  Skin:    General:  Skin is warm and dry.  Neurological:     Mental Status: She is alert and oriented to person, place, and time.     Cranial Nerves: No cranial nerve deficit.     Sensory: No sensory deficit.     Motor: No abnormal muscle tone.     Coordination: Coordination normal.  Psychiatric:        Mood and Affect: Mood normal.        Behavior: Behavior normal.    ED Results / Procedures / Treatments   Labs (all labs ordered are listed, but only abnormal results are displayed) Labs Reviewed - No data to display  EKG None  Radiology CT Head Wo Contrast  Result Date: 10/18/2020 CLINICAL DATA:  Fall.  Head injury EXAM: CT HEAD WITHOUT CONTRAST CT MAXILLOFACIAL WITHOUT CONTRAST CT CERVICAL SPINE WITHOUT CONTRAST TECHNIQUE: Multidetector CT imaging of the head, cervical spine, and maxillofacial structures were performed using the standard protocol without intravenous contrast. Multiplanar CT image reconstructions of the cervical spine and maxillofacial structures were also generated. COMPARISON:  CT head 10/07/2020 FINDINGS: CT HEAD FINDINGS Brain: Ventricle size normal. Hypodensities in the basal ganglia bilaterally unchanged compatible with chronic ischemia involving the right putamen and left posterior limb internal capsule. Negative for acute infarct, hemorrhage, mass. Vascular: Negative for hyperdense vessel Skull: Negative for skull fracture Other: None CT MAXILLOFACIAL FINDINGS Osseous: Negative for facial fracture. Poor dentition.  Multiple caries and periapical lucencies. Orbits: Negative for orbital fracture.  Negative for orbital mass. Sinuses: Mild mucosal edema base of right maxillary sinus Soft tissues: Soft tissue swelling over the right maxilla. CT CERVICAL SPINE FINDINGS Alignment: Normal Skull base and vertebrae: Negative for fracture Soft tissues and spinal canal: No soft tissue mass or hematoma Disc levels: Disc degeneration and spurring most prominent C5-6 and C6-7 with foraminal encroachment  bilaterally Upper chest: Apical emphysema.  No acute abnormality Other: None IMPRESSION: 1. No acute intracranial abnormality. Chronic infarcts in the basal ganglia bilaterally 2. Negative for facial fracture. 3. Negative for cervical spine fracture. Electronically Signed   By: Marlan Palau M.D.   On: 10/18/2020 19:04   CT Cervical Spine Wo Contrast  Result Date: 10/18/2020 CLINICAL DATA:  Fall.  Head injury EXAM: CT HEAD WITHOUT CONTRAST CT MAXILLOFACIAL WITHOUT CONTRAST CT CERVICAL SPINE WITHOUT CONTRAST TECHNIQUE: Multidetector CT imaging of the head, cervical spine, and maxillofacial structures were performed using the standard protocol without intravenous contrast. Multiplanar CT image reconstructions of the cervical spine and maxillofacial structures were also generated. COMPARISON:  CT head 10/07/2020 FINDINGS: CT HEAD FINDINGS Brain: Ventricle size normal. Hypodensities in the basal ganglia bilaterally unchanged compatible with chronic ischemia involving the right putamen and left posterior limb internal capsule. Negative for acute infarct, hemorrhage, mass. Vascular: Negative for hyperdense vessel Skull: Negative for skull fracture Other: None CT MAXILLOFACIAL FINDINGS Osseous: Negative for facial fracture. Poor dentition.  Multiple caries and periapical lucencies. Orbits: Negative for orbital fracture.  Negative for orbital mass. Sinuses: Mild mucosal edema base of right maxillary sinus Soft tissues: Soft tissue swelling over the right maxilla. CT CERVICAL SPINE FINDINGS Alignment: Normal Skull base and vertebrae: Negative for fracture Soft tissues and spinal canal: No soft tissue mass or hematoma Disc levels: Disc degeneration and spurring most prominent C5-6 and C6-7 with foraminal encroachment bilaterally Upper chest: Apical emphysema.  No acute abnormality Other: None IMPRESSION: 1. No acute intracranial abnormality. Chronic infarcts in the basal ganglia bilaterally 2. Negative for facial  fracture. 3. Negative for cervical spine fracture. Electronically Signed   By: Marlan Palau M.D.   On: 10/18/2020 19:04   CT Maxillofacial WO CM  Result Date: 10/18/2020 CLINICAL DATA:  Fall.  Head injury EXAM: CT HEAD WITHOUT CONTRAST CT MAXILLOFACIAL WITHOUT CONTRAST CT CERVICAL SPINE WITHOUT CONTRAST TECHNIQUE: Multidetector CT imaging of the head, cervical spine, and maxillofacial structures were performed using the standard protocol without intravenous contrast. Multiplanar CT image reconstructions of the cervical spine and maxillofacial structures were also generated. COMPARISON:  CT head 10/07/2020 FINDINGS: CT HEAD FINDINGS Brain: Ventricle size normal. Hypodensities in the basal ganglia bilaterally unchanged compatible with chronic ischemia involving the right putamen and left posterior limb internal capsule. Negative for acute infarct, hemorrhage, mass. Vascular: Negative for hyperdense vessel Skull: Negative for skull fracture Other: None CT MAXILLOFACIAL FINDINGS Osseous: Negative for facial fracture. Poor dentition.  Multiple caries and periapical lucencies. Orbits: Negative for orbital fracture.  Negative for orbital mass. Sinuses: Mild mucosal edema base of right maxillary sinus Soft tissues: Soft tissue swelling over the right maxilla. CT CERVICAL SPINE FINDINGS Alignment: Normal Skull base and vertebrae: Negative for fracture Soft tissues and spinal canal: No soft tissue mass or hematoma Disc levels: Disc degeneration and spurring most prominent C5-6 and C6-7 with foraminal encroachment bilaterally Upper chest: Apical emphysema.  No acute abnormality Other: None IMPRESSION: 1. No acute intracranial abnormality. Chronic infarcts in the basal ganglia bilaterally 2. Negative for facial fracture. 3. Negative for cervical spine fracture. Electronically Signed   By: Marlan Palau M.D.   On: 10/18/2020 19:04    Procedures .Marland KitchenLaceration Repair  Date/Time: 10/18/2020 7:39 PM Performed by: Mancel Bale, MD Authorized by: Mancel Bale, MD   Consent:    Consent obtained:  Emergent situation   Alternatives discussed:  No treatment Universal protocol:    Patient identity confirmed:  Arm band Anesthesia:    Anesthesia method:  Local infiltration   Local anesthetic:  Lidocaine 2% WITH epi Laceration details:    Location:  Face   Face location:  R eyebrow   Length (cm):  2   Depth (mm):  9 Pre-procedure details:    Preparation:  Patient was prepped and draped in usual sterile fashion Exploration:    Hemostasis achieved with:  Direct pressure Treatment:    Area cleansed with:  Saline   Amount of cleaning:  Standard   Irrigation solution:  Sterile saline   Visualized foreign bodies/material removed: no     Debridement:  None Skin repair:    Repair method:  Sutures  Suture size:  5-0   Suture material:  Prolene   Suture technique:  Simple interrupted   Number of sutures:  3 Approximation:    Approximation:  Loose   Medications Ordered in ED Medications  Tdap (BOOSTRIX) injection 0.5 mL (0.5 mLs Intramuscular Given 10/18/20 1800)  fentaNYL (SUBLIMAZE) injection 50 mcg (50 mcg Intramuscular Given 10/18/20 1759)    ED Course  I have reviewed the triage vital signs and the nursing notes.  Pertinent labs & imaging results that were available during my care of the patient were reviewed by me and considered in my medical decision making (see chart for details).    MDM Rules/Calculators/A&P                            Patient Vitals for the past 24 hrs:  BP Temp Temp src Pulse Resp SpO2 Height Weight  10/18/20 1942 133/69 -- -- 86 19 98 % -- --  10/18/20 1832 -- -- -- 89 -- 97 % -- --  10/18/20 1737 -- -- -- -- -- -- 5\' 1"  (1.549 m) 43 kg  10/18/20 1736 (!) 154/77 97.6 F (36.4 C) Oral 84 20 96 % -- --    7:38 PM Reevaluation with update and discussion. After initial assessment and treatment, an updated evaluation reveals patient wound cleansed by me.  She has a  gaping laceration 2.0 cm of the right upper eyebrow.  Moderate bleeding prevents closure with tissue adhesive.  Wound closed by me with suture repair.Mancel Bale. Gerianne Simonet   Medical Decision Making:  This patient is presenting for evaluation of witnessed fall with head and face injuries, which does require a range of treatment options, and is a complaint that involves a high risk of morbidity and mortality. The differential diagnoses include contusion, fracture, cervical spine injury, internal bleed. I decided to review old records, and in summary elderly female, with mechanical fall, injuries complicated by use of Plavix.  She is debilitated and lives in a nursing care facility.  She has history of CVA, hypertension and metabolic encephalopathy.  I obtained additional historical information from son on telephone, at 7:45 PM.  He was thankful to find that she was being treated and could go back to her facility.   Radiologic Tests Ordered, included CT, face, head, cervical spine.  I independently Visualized: Radiograph images, which show no serious injury    Critical Interventions-clinical evaluation, CT imaging, observation, laceration repair reassessment and discussion with family member  After These Interventions, the Patient was reevaluated and was found stable for discharge.  Patient with mechanical fall without serious injury, facial wound required suture closure.  No indication for further ED evaluation or hospitalization at this time  CRITICAL CARE-no Performed by: Mancel BaleElliott Isabellarose Kope  Nursing Notes Reviewed/ Care Coordinated Applicable Imaging Reviewed Interpretation of Laboratory Data incorporated into ED treatment  The patient appears reasonably screened and/or stabilized for discharge and I doubt any other medical condition or other [facility] requiring further screening, evaluation, or treatment in the ED at this time prior to discharge.  Plan: Home Medications-continue usual; Home  Treatments-wound care, suture removal 5 to 7 days; return here if the recommended treatment, does not improve the symptoms; Recommended follow up-PCP, as needed      Final Clinical Impression(s) / ED Diagnoses Final diagnoses:  Fall, initial encounter  Injury of head, initial encounter  Facial laceration, initial encounter  Abrasion of face, initial encounter    Rx /  DC Orders ED Discharge Orders     None        Mancel Bale, MD 10/18/20 [phone]

## 2020-10-18 NOTE — ED Notes (Signed)
Dr. Effie Shy at bedside for [facility].

## 2020-10-18 NOTE — Discharge Instructions (Addendum)
Keep the abrasion and laceration of the face clean with soap and water daily and apply an ointment such as Neosporin or bacitracin.  Use Tylenol if needed for pain.  Have the sutures removed from the right eyebrow wound, in 5 to 7 days.  Return here, if needed.

## 2020-10-18 NOTE — ED Triage Notes (Signed)
Patient's roommate at [place] states she witnessed patient stand up and trip over her feet, striking her right eye on the bed rail. Patient has abrasion with hematoma to right eye. Patient is on blood thinners and has small bleeding to right eye. Patient denies pain elsewhere.

## 2020-10-18 NOTE — ED Notes (Signed)
No answer from [facility]. Pt place don EMS transport list.

## 2020-10-31 ENCOUNTER — Ambulatory Visit (INDEPENDENT_AMBULATORY_CARE_PROVIDER_SITE_OTHER): Payer: Medicare Other | Admitting: Internal Medicine

## 2020-10-31 ENCOUNTER — Other Ambulatory Visit: Payer: Self-pay

## 2020-10-31 ENCOUNTER — Encounter: Payer: Self-pay | Admitting: Internal Medicine

## 2020-10-31 VITALS — BP 94/62 | HR 95 | Ht 61.0 in | Wt 85.8 lb

## 2020-10-31 DIAGNOSIS — I10 Essential (primary) hypertension: Secondary | ICD-10-CM

## 2020-10-31 NOTE — Progress Notes (Signed)
HPI Kendra Lee is referred today by Dr. Effie Shy for consideration for ILR insertion. She has had a cryptogenic stroke. She has had trouble swallowing and has a G-tube in place. She was in the ED with a large supra-orbital lac requiring stitches after a fall. She denies palpitations. She is quite frail. Her bmi is 16.  No Known Allergies   Current Outpatient Medications  Medication Sig Dispense Refill   amLODipine (NORVASC) 5 MG tablet Place 1 tablet (5 mg total) into feeding tube daily.     aspirin 81 MG chewable tablet Place 1 tablet (81 mg total) into feeding tube daily.     atorvastatin (LIPITOR) 80 MG tablet Place 1 tablet (80 mg total) into feeding tube daily.     clopidogrel (PLAVIX) 75 MG tablet Place 1 tablet (75 mg total) into feeding tube daily.     hydrALAZINE (APRESOLINE) 10 MG tablet Take 1 tablet (10 mg total) by mouth every 8 (eight) hours.     losartan (COZAAR) 25 MG tablet Place 1 tablet (25 mg total) into feeding tube daily.     Nutritional Supplements (FEEDING SUPPLEMENT, OSMOLITE 1.2 CAL,) LIQD Infuse at 55 cc/hour via pump 1000 mL 0   thiamine 100 MG tablet Place 1 tablet (100 mg total) into feeding tube daily.     Water For Irrigation, Sterile (FREE WATER) SOLN Place 200 mLs into feeding tube every 8 (eight) hours.     No current facility-administered medications for this visit.     Past Medical History:  Diagnosis Date   CVA (cerebral vascular accident) (HCC)    Dysphagia    Hypertension    Hypokalemia    Smoker    2 ppd x 55 years    ROS:   All systems reviewed and negative except as noted in the HPI.   Past Surgical History:  Procedure Laterality Date   IR GASTROSTOMY TUBE MOD SED  09/22/2020   IR GASTROSTOMY TUBE MOD SED  10/04/2020     No family history on file.   Social History   Socioeconomic History   Marital status: Legally Separated    Spouse name: Not on file   Number of children: Not on file   Years of education: Not on file    Highest education level: Not on file  Occupational History   Not on file  Tobacco Use   Smoking status: Never   Smokeless tobacco: Never  Substance and Sexual Activity   Alcohol use: Not on file   Drug use: Not on file   Sexual activity: Not on file  Other Topics Concern   Not on file  Social History Narrative   Not on file   Social Determinants of Health   Financial Resource Strain: Not on file  Food Insecurity: Not on file  Transportation Needs: Not on file  Physical Activity: Not on file  Stress: Not on file  Social Connections: Not on file  Intimate Partner Violence: Not on file     BP 94/62   Pulse 95   Ht 5\' 1"  (1.549 m)   Wt 85 lb 12.8 oz (38.9 kg)   LMP  (LMP Unknown)   SpO2 90%   BMI 16.21 kg/m   Physical Exam:  Chronically ill and frail appearing NAD HEENT: Unremarkable Neck:  6 cm JVD, no thyromegally Lymphatics:  No adenopathy Back:  No CVA tenderness Lungs:  Clear with no wheezes HEART:  Regular rate rhythm, no murmurs, no rubs, no  clicks Abd:  soft, positive bowel sounds, no organomegally, no rebound, no guarding Ext:  2 plus pulses, no edema, no cyanosis, no clubbing Skin:  No rashes no nodules Neuro:  CN II through XII intact, motor grossly intact  EKG - nsr   Assess/Plan:  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 I would suggest continued ASA and plavix. She is not a candidate for an OAC due to falls and multiple comorbidities. No indication for ILR due to falls.  Malnutrition - I would suggest a repeat B-swallow test. She is not thriving with her G-tube.   Kendra Gowda Kauan Kloosterman,MD

## 2020-10-31 NOTE — Patient Instructions (Signed)
Medication Instructions:  °Your physician recommends that you continue on your current medications as directed. Please refer to the Current Medication list given to you today. ° °*If you need a refill on your cardiac medications before your next appointment, please call your pharmacy* ° ° °Lab Work: °NONE  ° °If you have labs (blood work) drawn today and your tests are completely normal, you will receive your results only by: °MyChart Message (if you have MyChart) OR °A paper copy in the mail °If you have any lab test that is abnormal or we need to change your treatment, we will call you to review the results. ° ° °Testing/Procedures: °NONE  ° ° °Follow-Up: °At [facility], you and your health needs are our priority.  As part of our continuing mission to provide you with exceptional heart care, we have created designated Provider Care Teams.  These Care Teams include your primary Cardiologist (physician) and Advanced Practice Providers (APPs -  Physician Assistants and Nurse Practitioners) who all work together to provide you with the care you need, when you need it. ° °We recommend signing up for the patient portal called "MyChart".  Sign up information is provided on this After Visit Summary.  MyChart is used to connect with patients for Virtual Visits (Telemedicine).  Patients are able to view lab/test results, encounter notes, upcoming appointments, etc.  Non-urgent messages can be sent to your provider as well.   °To learn more about what you can do with MyChart, go to [email or web address].   ° °Your next appointment:   ° As Needed  ° °The format for your next appointment:   °In Person ° °Provider:   °Gregg Taylor, MD  ° ° °Other Instructions °Thank you for choosing [facility]! °  ° ° °

## 2020-11-07 ENCOUNTER — Non-Acute Institutional Stay: Payer: Medicare Other

## 2020-11-07 ENCOUNTER — Other Ambulatory Visit: Payer: Self-pay

## 2020-11-07 VITALS — BP 132/80 | HR 92 | Temp 97.9°F | Resp 16

## 2020-11-07 DIAGNOSIS — Z515 Encounter for palliative care: Secondary | ICD-10-CM

## 2020-11-07 NOTE — Progress Notes (Signed)
PATIENT NAME: Kendra Lee DOB: 06-24-1955 MRN: [id number]  PRIMARY CARE PROVIDER: Pcp, No  RESPONSIBLE PARTY:  Acct ID - Guarantor Home Phone Work Phone Relationship Acct Type  [id number] RIELLY, BRUNN* [phone]  Self P/F     [address], [place], [place] [address]    PLAN OF CARE and INTERVENTIONS:               1.  GOALS OF CARE/ ADVANCE CARE PLANNING:  Phone call made to son Donnetta Hail to follow up on GOC.  Discussed code status as patient is currently a full code.  At this time, son would like for her to remain a full code as he is hoping patient will be able to return home with 24/7 care from family.  Further discussion will need to be completed on code status once more answers are received on patient's treatment.  Son states they would not want intubation, ventilation or tube feeding for patient.  We discussed MOST form and son would like to complete this.  Family would like to bring patient home but they would not be able to manage the tube feeding.  Son believes another swallowing study was planned but he has not heard anything regarding this.  He notes patient has been in the facility since the end of June but he is uncertain of any updates for patient.  I spoke with Jackelyn Poling, SW for facility.  Advised of above conversation and need for additional information to be obtained for family.                2.  PATIENT/CAREGIVER EDUCATION:  Patient asking about eating again.  We discussed her recent CVA and affects of dysphagia.  Explained risk for aspiration. Patient verbalized understanding but has hopes she will be able to consume food again.  Advised of Palliative Care referral and role in her care.                 4. PERSONAL EMERGENCY PLAN:  Activate 911 for emergencies.               5.  DISEASE STATUS:  Patient found in bed awake, very frail and thin in appearance.  She allows me to open her blinds for better lighting.  Facial grimaces present and patient attempting to  shift her weight in the bed.  She notes back pain.  Patient is repositioned and comfort achieved for 5-10 minutes.  I spoke with the staff and patient currently has prn tylenol in place.  She has been taking this mostly for headaches but staff will administer another via G-tube with am medications.  Discussed pain management with son.  He is agreeable to patient having what she needs but does not wish her to be sedated.   Patient was recently seen in the ED due to a fall.  She sustained a laceration above her right eye requiring sutures. Patient has discoloration to her bilateral arms and a blanchable Stage 1 to her sacrum.  Patient is being follow by therapy in the facility.  I was able to speak with Dawn, PT who states patient is minimal assistance with her mobility.  She is ambulating 40 ft with a walker.  Patient is not de-sating with exercise but has prn oxygen ordered.  I advised of my phone conversation with son and they desire her to return home with 24/7 support.  Speech therapy is not currently in the building.  I have provided my contact  information and have requested ST call me when she arrives.  I would like to address repeat barium swallow that has been recommended and to see how patient is progressing with speech therapy.   HISTORY OF PRESENT ILLNESS:  65 year old female with a hx of CVA and Dysphagia.  Patient is being followed by Palliative Care every 4-8 weeks.  CODE STATUS: Full ADVANCED DIRECTIVES: No MOST FORM: No PPS: 50%   PHYSICAL EXAM:   VITALS: Today's Vitals   11/07/20 0841  BP: 132/80  Pulse: 92  Resp: 16  Temp: 97.9 F (36.6 C)  SpO2: 90%  PainLoc: Back    LUNGS: positive findings: rales right mid posterior and right lower posterior CARDIAC: Cor RRR}  EXTREMITIES: - for edema  SKIN: Skin color, texture, turgor normal. No rashes or lesions or stage 1 blanchable redness to    NEURO: positive for gait problems and headaches       Truitt Merle, RN

## 2020-12-28 ENCOUNTER — Ambulatory Visit (HOSPITAL_COMMUNITY): Payer: Medicare Other | Admitting: Speech Pathology

## 2020-12-29 ENCOUNTER — Other Ambulatory Visit (HOSPITAL_COMMUNITY): Payer: Self-pay | Admitting: Specialist

## 2020-12-29 DIAGNOSIS — R1312 Dysphagia, oropharyngeal phase: Secondary | ICD-10-CM

## 2021-01-03 ENCOUNTER — Telehealth: Payer: Self-pay

## 2021-01-03 NOTE — Telephone Encounter (Signed)
1221 pm  Phone call made to son Kendra Lee to follow up on patient's disposition.  Kendra Lee reports patient is home  from [facility] and doing well.  She is working with a home health agency and receiving nursing and speech therapy services.  Physical therapy was recently discontinued.  Patient is now established with a PCP but Kendra Lee does not have the name.  Advised that PC could continue to follow patient at home if they desire this service.  Kendra Lee will speak with Ms. Campo and call back tomorrow with an answer.

## 2021-01-04 ENCOUNTER — Ambulatory Visit (HOSPITAL_COMMUNITY)
Admission: RE | Admit: 2021-01-04 | Discharge: 2021-01-04 | Disposition: A | Discharge status: Home / Self Care | Payer: Medicare Other | Source: Ambulatory Visit | Attending: Gerontology | Admitting: Gerontology

## 2021-01-04 ENCOUNTER — Ambulatory Visit (HOSPITAL_COMMUNITY): Payer: Medicare Other | Attending: Gerontology | Admitting: Speech Pathology

## 2021-01-04 ENCOUNTER — Other Ambulatory Visit: Payer: Self-pay

## 2021-01-04 ENCOUNTER — Encounter (HOSPITAL_COMMUNITY): Payer: Self-pay | Admitting: Speech Pathology

## 2021-01-04 DIAGNOSIS — R1312 Dysphagia, oropharyngeal phase: Secondary | ICD-10-CM | POA: Insufficient documentation

## 2021-01-04 NOTE — Therapy (Signed)
[facility] [address] [place], [place], [address] Phone: [phone]   Fax:  [phone]  Modified Barium Swallow  Patient Details  Name: Kendra Lee MRN: [id number] Date of Birth: 08-15-1955 No data recorded  Encounter Date: 01/04/2021   End of Session - 01/04/21 1406     Visit Number 1    Number of Visits 1    Authorization Type [employer]    SLP Start Time 1135    SLP Stop Time  1205    SLP Time Calculation (min) 30 min    Activity Tolerance Patient tolerated treatment well             Past Medical History:  Diagnosis Date   CVA (cerebral vascular accident) (HCC)    Dysphagia    Hypertension    Hypokalemia    Smoker    2 ppd x 55 years    Past Surgical History:  Procedure Laterality Date   IR GASTROSTOMY TUBE MOD SED  09/22/2020   IR GASTROSTOMY TUBE MOD SED  10/04/2020    There were no vitals filed for this visit.   Subjective Assessment - 01/04/21 1350     Subjective "I am going to [place] when I leave here."    Currently in Pain? No/denies                 General - 01/04/21 1353       General Information   Date of Onset 09/17/20    HPI Kendra Lee is a 65 yo female who was referred for MBSS by Melody Comas. She was admitted to [facility] 09/17/20 with bilateral thalamic strokes and discharged to [facility] with a PEG and trials of puree and HTL. She has since been discharged home after an extended stay at [facility] and has been consuming soft solids and small sips of thin liquids.    Type of Study MBS-Modified Barium Swallow Study    Previous Swallow Assessment MBSS 10/03/20 trials puree and tsp HTL    Diet Prior to this Study Dysphagia 2 (chopped);Thin liquids    Temperature Spikes Noted No    Respiratory Status Room air    History of Recent Intubation No    Behavior/Cognition Alert;Cooperative;Pleasant mood    Oral Cavity Assessment Within Functional Limits;Other (comment)   dental decay    Oral Care Completed by SLP No    Oral Cavity - Dentition Poor condition    Vision Functional for self feeding    Self-Feeding Abilities Able to feed self    Patient Positioning Upright in chair    Baseline Vocal Quality Normal    Volitional Cough Strong    Volitional Swallow Able to elicit    Anatomy Within functional limits    Pharyngeal Secretions Not observed secondary MBS                Oral Preparation/Oral Phase - 01/04/21 1402       Oral Preparation/Oral Phase   Oral Phase Impaired      Oral - Thin   Oral - Thin Teaspoon Within functional limits    Oral - Thin Cup --   premature spillage   Oral - Thin Straw Within functional limits      Oral - Solids   Oral - Puree Within functional limits    Oral - Regular Piecemeal swallowing;Oral residue;Weak ligual manipulation    Oral - Pill --   unable with thin, ok with puree  Electrical stimulation - Oral Phase   Was Electrical Stimulation Used No              Pharyngeal Phase - 01/04/21 1403       Pharyngeal Phase   Pharyngeal Phase Impaired      Pharyngeal - Thin   Pharyngeal- Thin Teaspoon Swallow initiation at pyriform sinus;Reduced airway/laryngeal closure;Penetration/Aspiration before swallow    Pharyngeal Material does not enter airway;Material enters airway, remains ABOVE vocal cords and not ejected out    Pharyngeal- Thin Cup Swallow initiation at vallecula;Delayed swallow initiation;Swallow initiation at pyriform sinus;Penetration/Aspiration during swallow;Reduced airway/laryngeal closure    Pharyngeal Material does not enter airway;Material enters airway, remains ABOVE vocal cords and not ejected out    Pharyngeal- Thin Straw Swallow initiation at pyriform sinus;Penetration/Aspiration before swallow;Reduced airway/laryngeal closure;Penetration/Aspiration during swallow    Pharyngeal Material enters airway, remains ABOVE vocal cords and not ejected out   trace     Pharyngeal - Solids   Pharyngeal-  Puree Within functional limits    Pharyngeal- Regular Within functional limits    Pharyngeal- Pill Within functional limits   in puree     Electrical Stimulation - Pharyngeal Phase   Was Electrical Stimulation Used No              Cricopharyngeal Phase - 01/04/21 1405       Cervical Esophageal Phase   Cervical Esophageal Phase Within functional limits               Plan - 01/04/21 1407     Clinical Impression Statement Pt presents with mild oropharyngeal dysphagia characterized by weak lingual manipulation resulting in impaired AP transit with barium tablet with thin, premature spillage with liquids to the pharynx, and prolonged oral transit with solids and min lingual residue; pharyngeal phase is marked by premature spillage with min delay in swallow initiation and reduced laryngeal vestibule closure resulting in trace amounts of penetration of thins and NTL before and during the swallow, without aspiration and without complete removal of trace penetrants despite cued cough and throat clear (underepiglottic coating). Recommend regular textures (whatever she can manage with her poor dentition) and thin liquids via small cup sip presentation and po medication whole in puree or with liquids as able (she says she has been doing this successfully at home). Pt was encouraged to improve cough and practice good oral care.    Treatment/Interventions Patient/family education    Potential to Achieve Goals Good    Consulted and Agree with Plan of Care Patient             Patient will benefit from skilled therapeutic intervention in order to improve the following deficits and impairments:   Dysphagia, oropharyngeal phase     Recommendations/Treatment - 01/04/21 1405       Swallow Evaluation Recommendations   SLP Diet Recommendations Thin;Dysphagia 3 (mechanical soft);Age appropriate regular    Liquid Administration via Cup    Medication Administration Whole meds with puree     Supervision Patient able to self feed    Compensations Small sips/bites;Hard cough after swallow;Multiple dry swallows after each bite/sip    Postural Changes Seated upright at 90 degrees;Remain upright for at least 30 minutes after feeds/meals              Prognosis - 01/04/21 1406       Prognosis   Prognosis for Safe Diet Advancement Good      Individuals Consulted   Consulted and Agree with Results and Recommendations  Patient    Report Sent to  Referring physician             Problem List Patient Active Problem List   Diagnosis Date Noted   Vitamin D deficiency 10/02/2020   Dental caries 10/02/2020   Dysphagia 10/02/2020   Hypokalemia 10/02/2020   Acute metabolic encephalopathy 09/18/2020   CVA (cerebral vascular accident) [facility]) 09/18/2020   Leukocytosis 09/18/2020   Essential hypertension 09/18/2020   Thank you,  [name], CCC-SLP [phone]  Cristianna Cyr 01/04/2021, 2:15 PM  [facility] [address] [place], [place], [address] Phone: [phone]   Fax:  [phone]  Name: Kenly Henckel MRN: [id number] Date of Birth: 02-19-1956

## 2021-03-01 ENCOUNTER — Ambulatory Visit (INDEPENDENT_AMBULATORY_CARE_PROVIDER_SITE_OTHER): Payer: Medicare Other | Admitting: General Surgery

## 2021-03-01 ENCOUNTER — Encounter: Payer: Self-pay | Admitting: General Surgery

## 2021-03-01 ENCOUNTER — Other Ambulatory Visit: Payer: Self-pay

## 2021-03-01 VITALS — BP 141/83 | HR 73 | Temp 98.6°F | Resp 12 | Ht 61.0 in | Wt 92.0 lb

## 2021-03-01 DIAGNOSIS — Z931 Gastrostomy status: Secondary | ICD-10-CM | POA: Diagnosis not present

## 2021-03-01 NOTE — Progress Notes (Signed)
Kendra Lee; [id number]; 01-Aug-1955   HPI Patient is a 65 year old white female who was referred to my care by Dr. Felecia Shelling for gastrostomy tube removal.  Initially was placed in July of this year.  Patient states she last used it in October.  Since she has not used it, she has been referred to my care for removal.  This was placed by interventional radiology. Past Medical History:  Diagnosis Date   CVA (cerebral vascular accident) (HCC)    Dysphagia    Hypertension    Hypokalemia    Smoker    2 ppd x 55 years    Past Surgical History:  Procedure Laterality Date   IR GASTROSTOMY TUBE MOD SED  09/22/2020   IR GASTROSTOMY TUBE MOD SED  10/04/2020    History reviewed. No pertinent family history.  Current Outpatient Medications on File Prior to Visit  Medication Sig Dispense Refill   amLODipine (NORVASC) 5 MG tablet Place 1 tablet (5 mg total) into feeding tube daily.     aspirin 81 MG chewable tablet Place 1 tablet (81 mg total) into feeding tube daily.     atorvastatin (LIPITOR) 80 MG tablet Place 1 tablet (80 mg total) into feeding tube daily.     clopidogrel (PLAVIX) 75 MG tablet Place 1 tablet (75 mg total) into feeding tube daily.     hydrALAZINE (APRESOLINE) 10 MG tablet Take 1 tablet (10 mg total) by mouth every 8 (eight) hours.     losartan (COZAAR) 25 MG tablet Place 1 tablet (25 mg total) into feeding tube daily.     Nutritional Supplements (FEEDING SUPPLEMENT, OSMOLITE 1.2 CAL,) LIQD Infuse at 55 cc/hour via pump 1000 mL 0   thiamine 100 MG tablet Place 1 tablet (100 mg total) into feeding tube daily.     Water For Irrigation, Sterile (FREE WATER) SOLN Place 200 mLs into feeding tube every 8 (eight) hours.     No current facility-administered medications on file prior to visit.    No Known Allergies  Social History   Substance and Sexual Activity  Alcohol Use None    Social History   Tobacco Use  Smoking Status Never  Smokeless Tobacco Never    Review of  Systems  Constitutional: Negative.   HENT: Negative.    Eyes: Negative.   Respiratory:  Positive for shortness of breath.   Cardiovascular: Negative.   Gastrointestinal: Negative.   Genitourinary:  Positive for frequency.  Musculoskeletal:  Positive for back pain.  Skin: Negative.   Neurological: Negative.   Endo/Heme/Allergies: Negative.   Psychiatric/Behavioral: Negative.     Objective   Vitals:   03/01/21 1055  BP: (!) 141/83  Pulse: 73  Resp: 12  Temp: 98.6 F (37 C)  SpO2: 93%    Physical Exam Vitals reviewed.  Constitutional:      Appearance: Normal appearance. She is not ill-appearing.  HENT:     Head: Normocephalic and atraumatic.  Cardiovascular:     Rate and Rhythm: Normal rate and regular rhythm.     Heart sounds: Normal heart sounds. No murmur heard.   No friction rub. No gallop.  Pulmonary:     Effort: Pulmonary effort is normal. No respiratory distress.     Breath sounds: Normal breath sounds. No stridor. No wheezing, rhonchi or rales.  Abdominal:     General: Abdomen is flat. There is no distension.     Palpations: Abdomen is soft.     Tenderness: There is no abdominal tenderness. There  is no guarding.     Comments: Patient has a pull gastrostomy tube in place.  It was removed in the office without difficulty.  Silver nitrate was applied to the granulation tissue.  Patient tolerated removal well.  Neurological:     Mental Status: She is alert.   IR procedure notes reviewed Assessment  Gastrostomy tube removal Plan  Keep wound clean and dry with soap and water.  Return should any issues arise.  Follow-up here as needed.

## 2021-03-27 ENCOUNTER — Other Ambulatory Visit (HOSPITAL_COMMUNITY): Payer: Self-pay | Admitting: Internal Medicine

## 2021-03-27 ENCOUNTER — Other Ambulatory Visit (HOSPITAL_COMMUNITY): Payer: Self-pay | Admitting: Gerontology

## 2021-03-27 DIAGNOSIS — Z23 Encounter for immunization: Secondary | ICD-10-CM | POA: Diagnosis not present

## 2021-03-27 DIAGNOSIS — E43 Unspecified severe protein-calorie malnutrition: Secondary | ICD-10-CM | POA: Diagnosis not present

## 2021-03-27 DIAGNOSIS — Z1231 Encounter for screening mammogram for malignant neoplasm of breast: Secondary | ICD-10-CM

## 2021-03-27 DIAGNOSIS — Z0001 Encounter for general adult medical examination with abnormal findings: Secondary | ICD-10-CM | POA: Diagnosis not present

## 2021-03-27 DIAGNOSIS — Z1331 Encounter for screening for depression: Secondary | ICD-10-CM | POA: Diagnosis not present

## 2021-03-27 DIAGNOSIS — Z1389 Encounter for screening for other disorder: Secondary | ICD-10-CM | POA: Diagnosis not present

## 2021-03-27 DIAGNOSIS — G8191 Hemiplegia, unspecified affecting right dominant side: Secondary | ICD-10-CM | POA: Diagnosis not present

## 2021-03-27 DIAGNOSIS — Z780 Asymptomatic menopausal state: Secondary | ICD-10-CM

## 2021-03-27 DIAGNOSIS — I10 Essential (primary) hypertension: Secondary | ICD-10-CM | POA: Diagnosis not present

## 2021-03-30 DIAGNOSIS — Z0001 Encounter for general adult medical examination with abnormal findings: Secondary | ICD-10-CM | POA: Diagnosis not present

## 2021-03-30 DIAGNOSIS — I10 Essential (primary) hypertension: Secondary | ICD-10-CM | POA: Diagnosis not present

## 2021-03-30 DIAGNOSIS — E46 Unspecified protein-calorie malnutrition: Secondary | ICD-10-CM | POA: Diagnosis not present

## 2021-04-19 ENCOUNTER — Ambulatory Visit (HOSPITAL_COMMUNITY)
Admission: RE | Admit: 2021-04-19 | Discharge: 2021-04-19 | Disposition: A | Discharge status: Home / Self Care | Payer: Medicare HMO | Source: Ambulatory Visit | Attending: Gerontology | Admitting: Gerontology

## 2021-04-19 ENCOUNTER — Ambulatory Visit (HOSPITAL_COMMUNITY)
Admission: RE | Admit: 2021-04-19 | Discharge: 2021-04-19 | Disposition: A | Discharge status: Home / Self Care | Payer: Medicare HMO | Source: Ambulatory Visit | Attending: Internal Medicine | Admitting: Internal Medicine

## 2021-04-19 ENCOUNTER — Other Ambulatory Visit: Payer: Self-pay

## 2021-04-19 DIAGNOSIS — Z780 Asymptomatic menopausal state: Secondary | ICD-10-CM | POA: Insufficient documentation

## 2021-04-19 DIAGNOSIS — Z1231 Encounter for screening mammogram for malignant neoplasm of breast: Secondary | ICD-10-CM | POA: Insufficient documentation

## 2021-04-19 DIAGNOSIS — M810 Age-related osteoporosis without current pathological fracture: Secondary | ICD-10-CM | POA: Diagnosis not present

## 2021-05-03 DIAGNOSIS — R42 Dizziness and giddiness: Secondary | ICD-10-CM | POA: Diagnosis not present

## 2021-05-03 DIAGNOSIS — I10 Essential (primary) hypertension: Secondary | ICD-10-CM | POA: Diagnosis not present

## 2021-05-31 DIAGNOSIS — I10 Essential (primary) hypertension: Secondary | ICD-10-CM | POA: Diagnosis not present

## 2021-05-31 DIAGNOSIS — M159 Polyosteoarthritis, unspecified: Secondary | ICD-10-CM | POA: Diagnosis not present

## 2021-07-01 DIAGNOSIS — M159 Polyosteoarthritis, unspecified: Secondary | ICD-10-CM | POA: Diagnosis not present

## 2021-07-01 DIAGNOSIS — I10 Essential (primary) hypertension: Secondary | ICD-10-CM | POA: Diagnosis not present

## 2021-07-31 DIAGNOSIS — I10 Essential (primary) hypertension: Secondary | ICD-10-CM | POA: Diagnosis not present

## 2021-07-31 DIAGNOSIS — G8191 Hemiplegia, unspecified affecting right dominant side: Secondary | ICD-10-CM | POA: Diagnosis not present

## 2021-08-05 IMAGING — DX DG CHEST 1V PORT
1 series · 1 of 1 positions shown · non-contrast
Comparison: Chest x-ray dated 09/17/2020.

CLINICAL DATA: Altered mental status, leukocytosis.  Former smoker.

EXAM:
PORTABLE CHEST 1 VIEW

[chest ap]
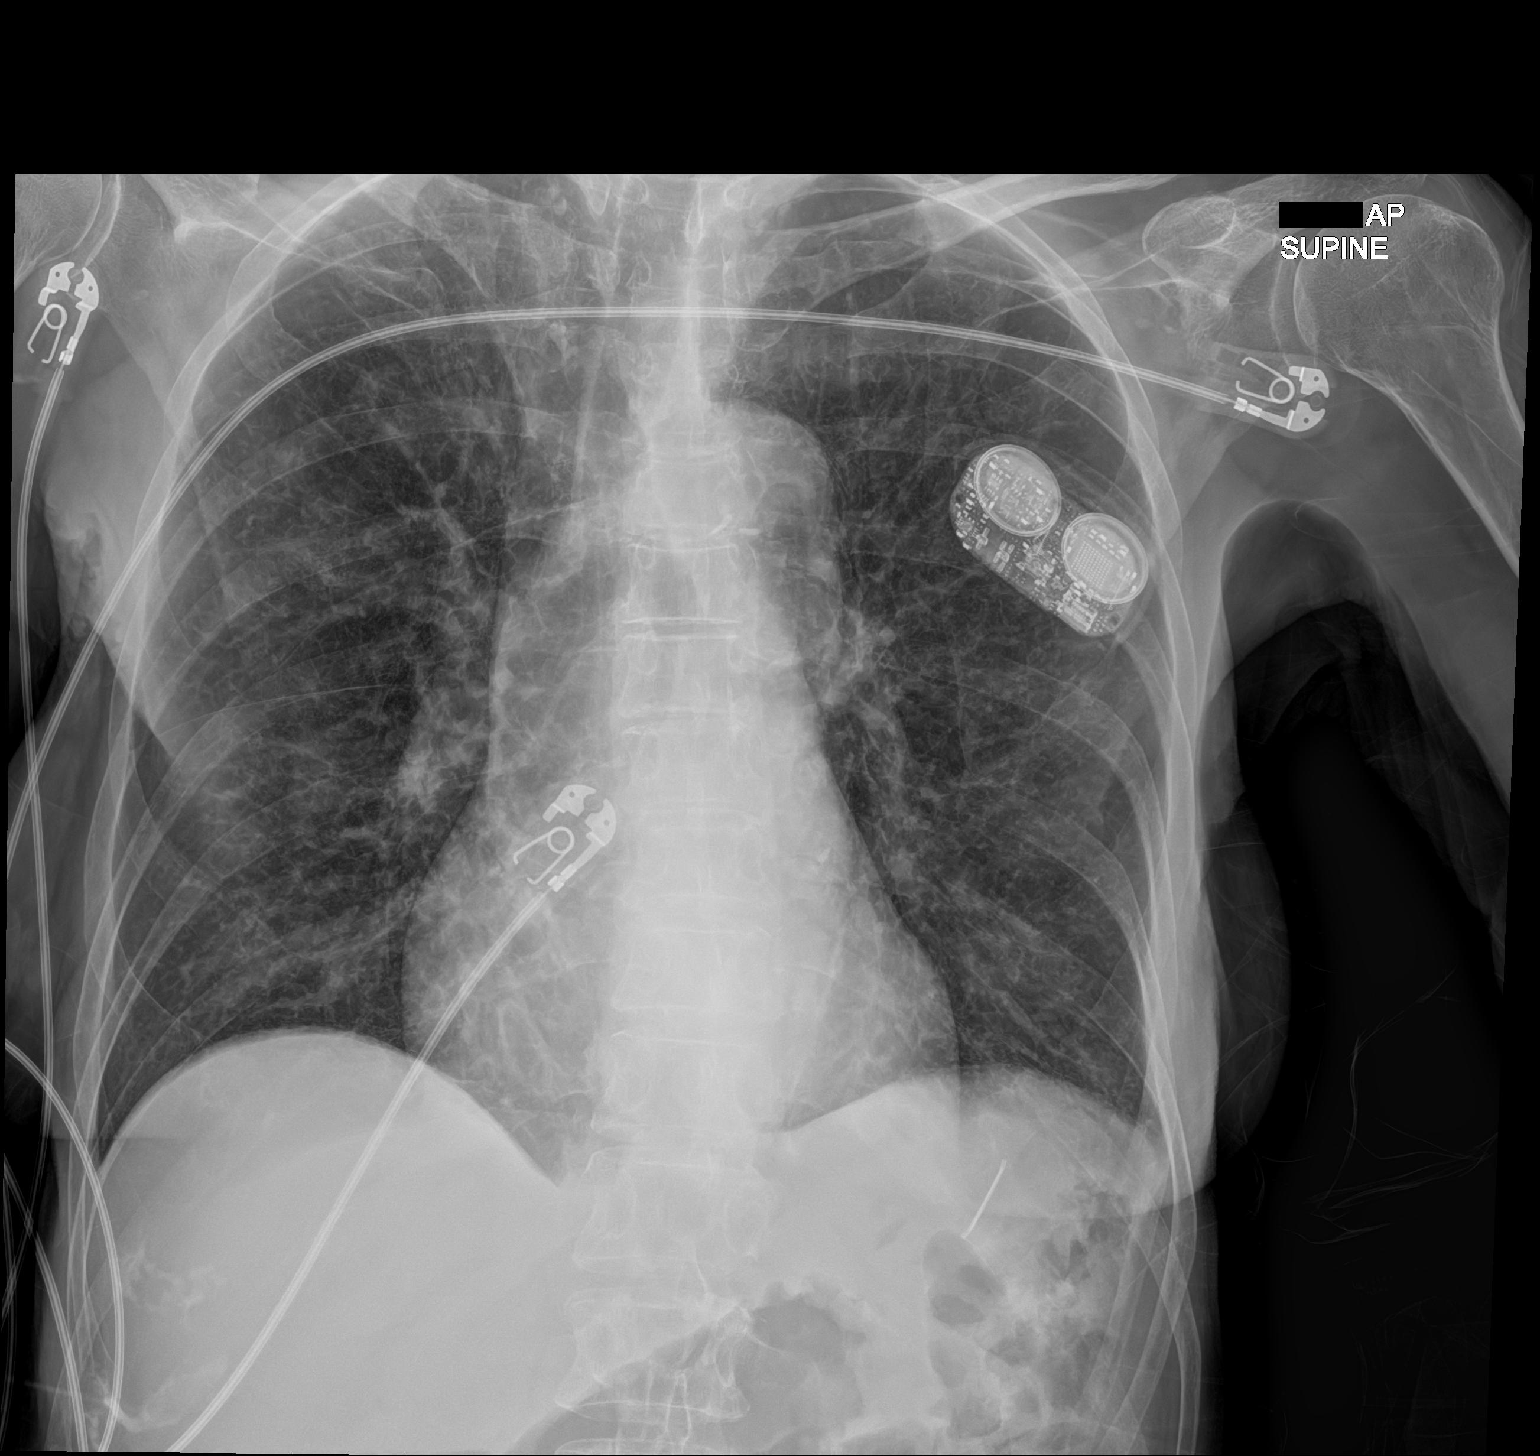

[1 of 1 positions shown; findings below may reference images not displayed]

FINDINGS: Heart size and mediastinal contours are stable. Lungs are
hyperexpanded. Coarse lung markings are again seen bilaterally
without significant interval change. No new lung findings. No
pleural effusion or pneumothorax is seen. Osseous structures about
the chest are unremarkable.
IMPRESSION: 1. No active disease. No evidence of pneumonia or pulmonary edema.
2. Hyperexpanded lungs indicating COPD.
3. Chronic interstitial lung disease.

## 2021-08-31 DIAGNOSIS — I10 Essential (primary) hypertension: Secondary | ICD-10-CM | POA: Diagnosis not present

## 2021-08-31 DIAGNOSIS — M810 Age-related osteoporosis without current pathological fracture: Secondary | ICD-10-CM | POA: Diagnosis not present

## 2021-09-28 DIAGNOSIS — I10 Essential (primary) hypertension: Secondary | ICD-10-CM | POA: Diagnosis not present

## 2021-09-28 DIAGNOSIS — G8191 Hemiplegia, unspecified affecting right dominant side: Secondary | ICD-10-CM | POA: Diagnosis not present

## 2021-09-28 DIAGNOSIS — M810 Age-related osteoporosis without current pathological fracture: Secondary | ICD-10-CM | POA: Diagnosis not present

## 2021-09-28 DIAGNOSIS — E43 Unspecified severe protein-calorie malnutrition: Secondary | ICD-10-CM | POA: Diagnosis not present

## 2021-10-29 DIAGNOSIS — E782 Mixed hyperlipidemia: Secondary | ICD-10-CM | POA: Diagnosis not present

## 2021-10-29 DIAGNOSIS — G8191 Hemiplegia, unspecified affecting right dominant side: Secondary | ICD-10-CM | POA: Diagnosis not present

## 2021-11-29 DIAGNOSIS — I10 Essential (primary) hypertension: Secondary | ICD-10-CM | POA: Diagnosis not present

## 2021-11-29 DIAGNOSIS — G8191 Hemiplegia, unspecified affecting right dominant side: Secondary | ICD-10-CM | POA: Diagnosis not present

## 2021-12-01 DIAGNOSIS — R531 Weakness: Secondary | ICD-10-CM | POA: Diagnosis not present

## 2021-12-29 DIAGNOSIS — I5022 Chronic systolic (congestive) heart failure: Secondary | ICD-10-CM | POA: Diagnosis not present

## 2021-12-29 DIAGNOSIS — I10 Essential (primary) hypertension: Secondary | ICD-10-CM | POA: Diagnosis not present

## 2021-12-31 DIAGNOSIS — R531 Weakness: Secondary | ICD-10-CM | POA: Diagnosis not present

## 2022-01-29 DIAGNOSIS — I5022 Chronic systolic (congestive) heart failure: Secondary | ICD-10-CM | POA: Diagnosis not present

## 2022-01-29 DIAGNOSIS — I10 Essential (primary) hypertension: Secondary | ICD-10-CM | POA: Diagnosis not present

## 2022-02-21 IMAGING — MG MM DIGITAL SCREENING BILAT W/ TOMO AND CAD
6 of 12 series · 6 of 36 positions shown · non-contrast
Comparison: None.

CLINICAL DATA: Screening.

EXAM:
DIGITAL SCREENING BILATERAL MAMMOGRAM WITH TOMOSYNTHESIS AND CAD
TECHNIQUE: Bilateral screening digital craniocaudal and mediolateral oblique
mammograms were obtained. Bilateral screening digital breast
tomosynthesis was performed. The images were evaluated with
computer-aided detection.

[R MLO synth-2D (1 of 2)]
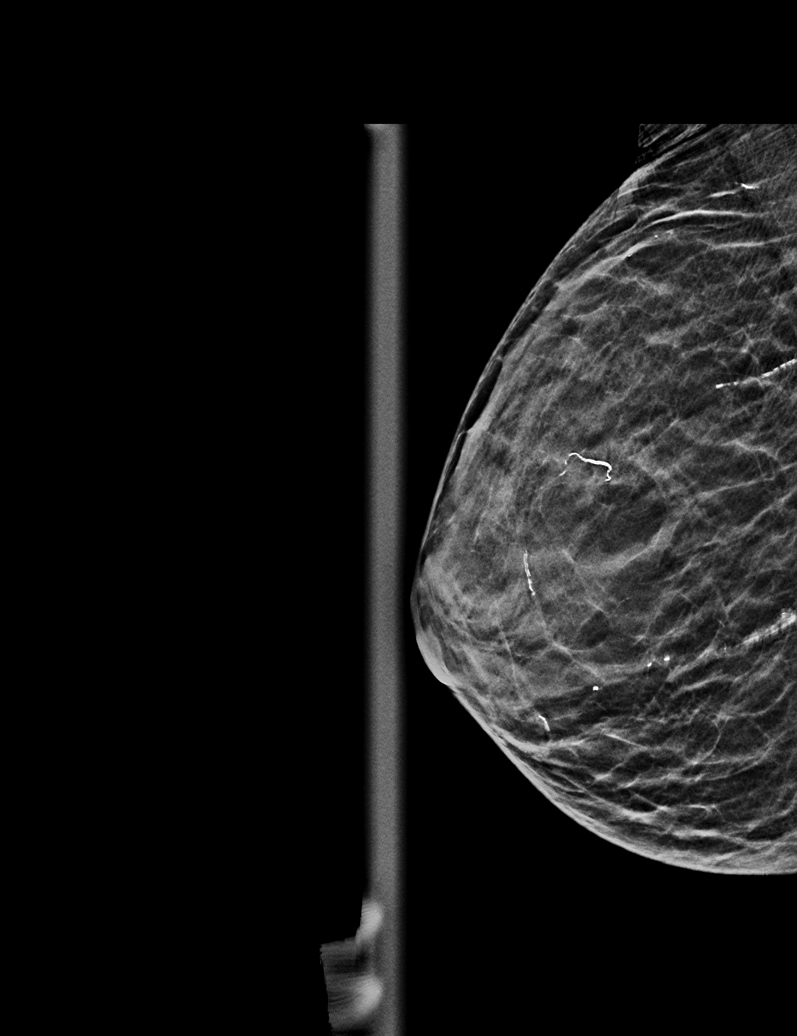

[R MLO synth-2D (2 of 2)]
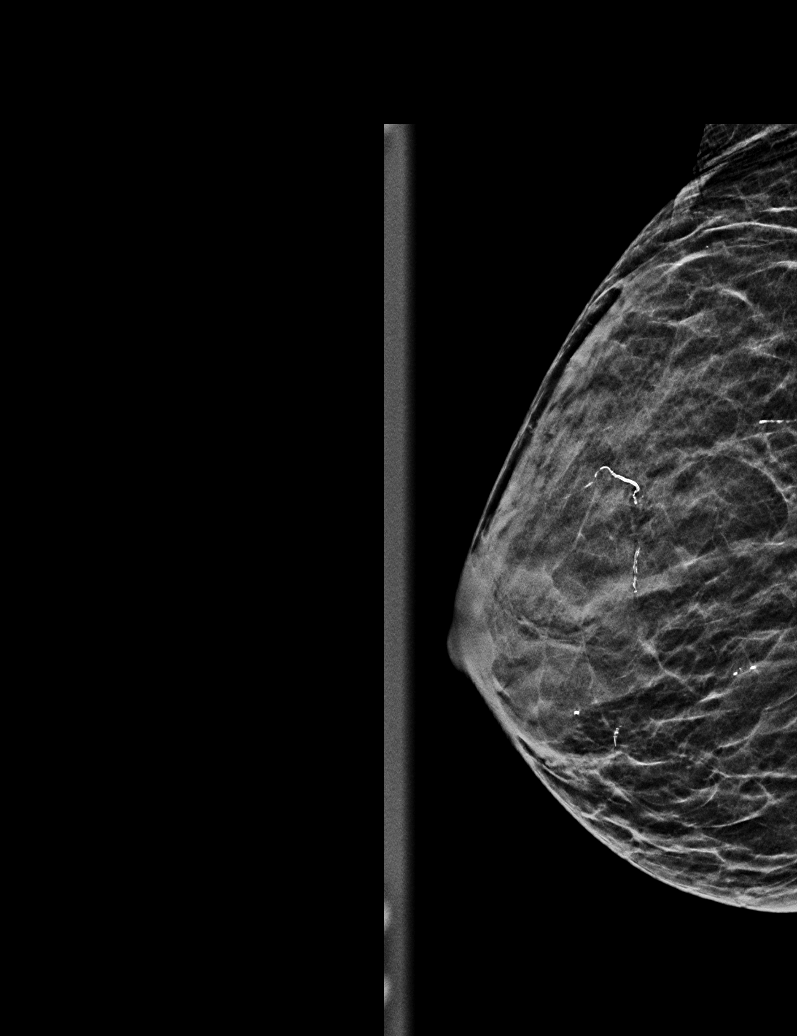

[L MLO synth-2D (1 of 2)]
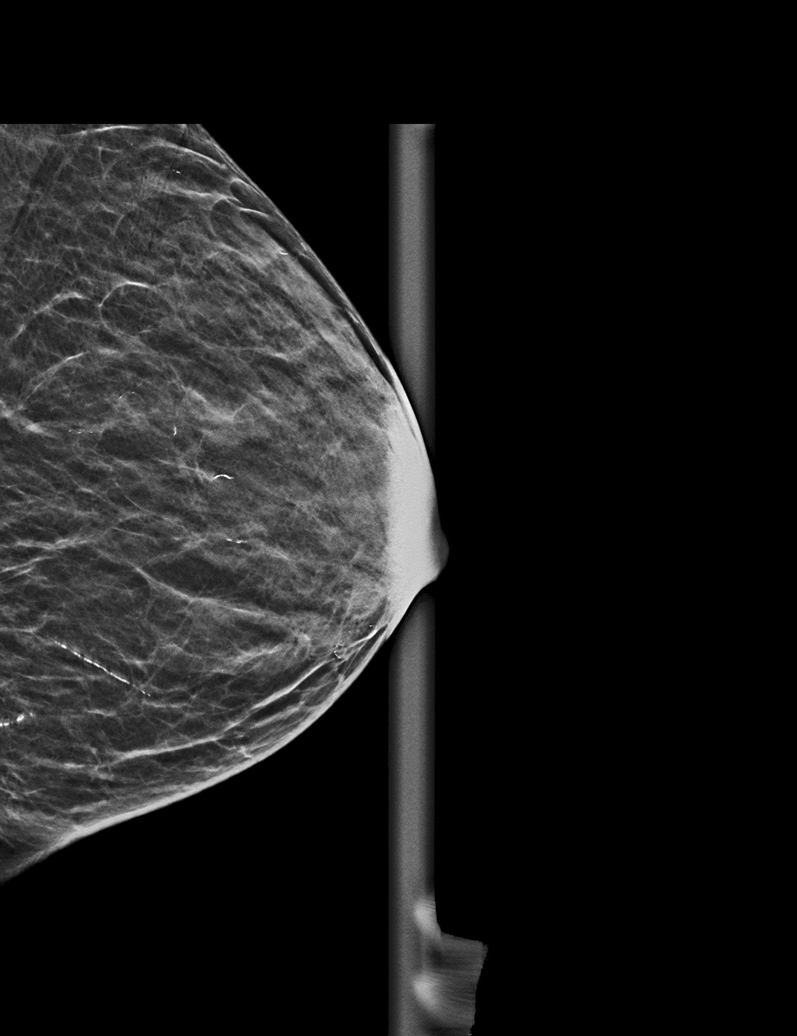

[L MLO synth-2D (2 of 2)]
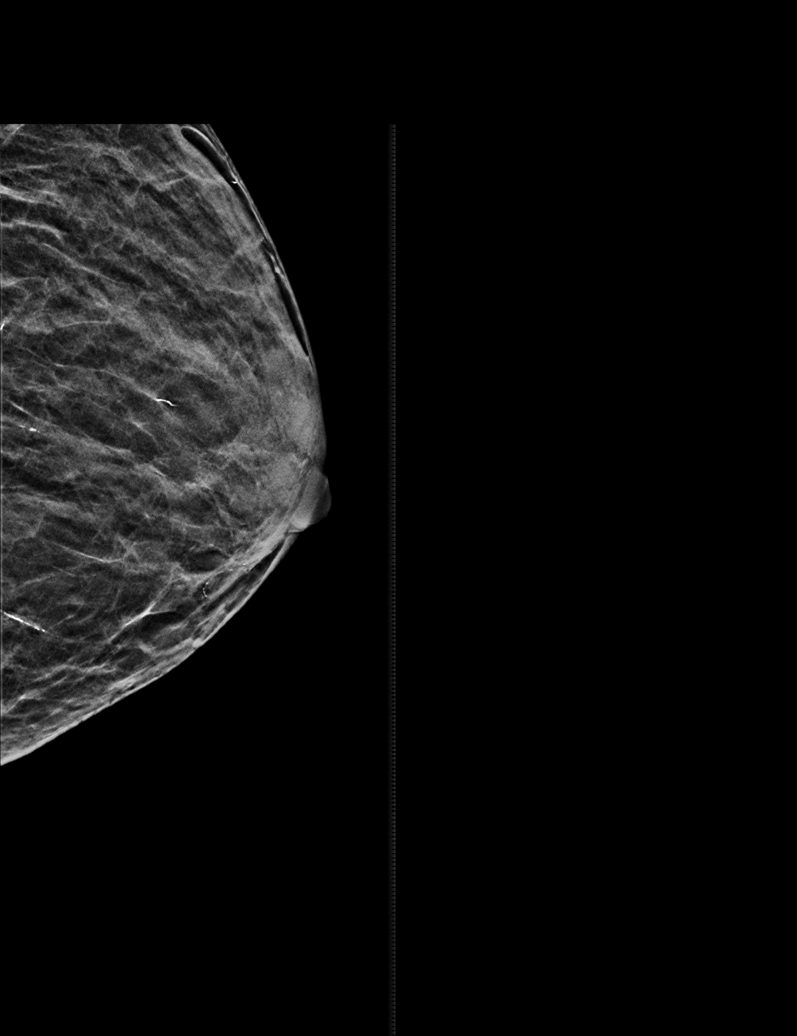

[L CC synth-2D]
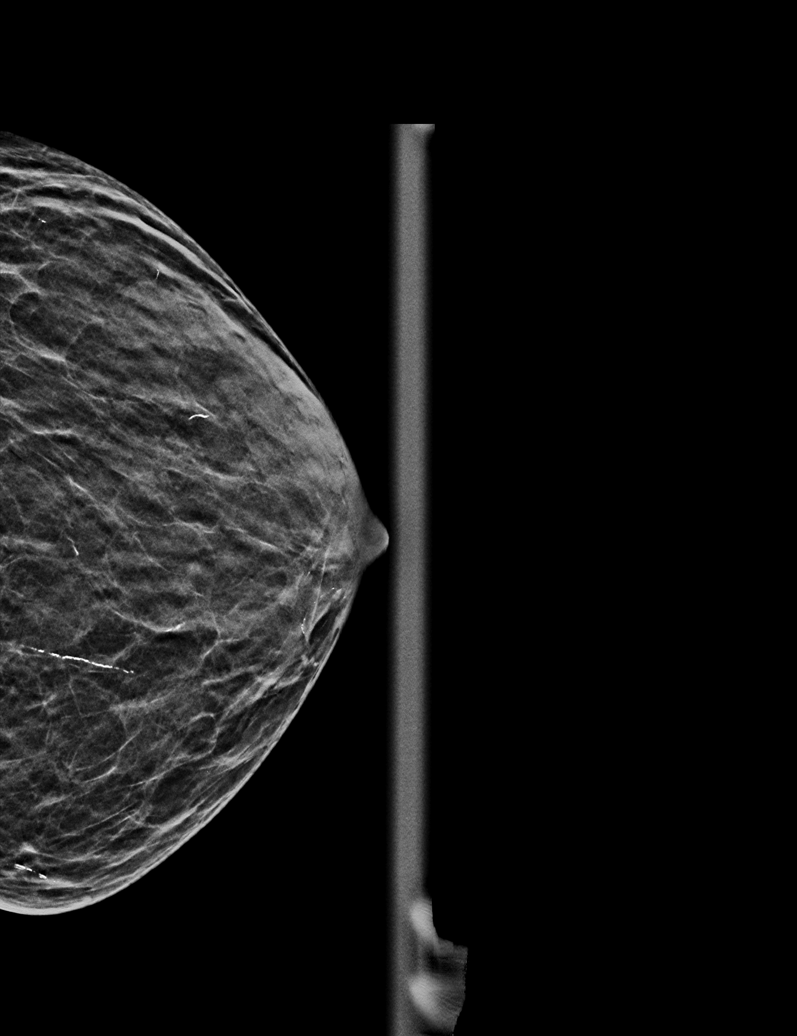

[R CC synth-2D]
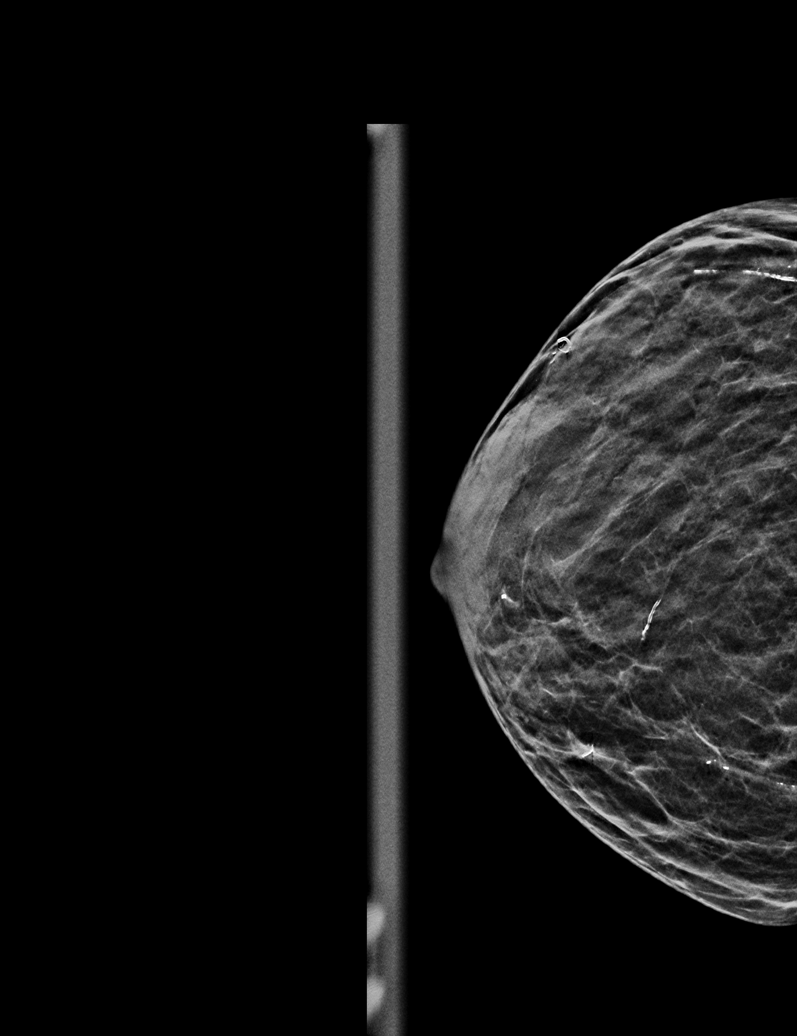

[6 of 36 positions shown; findings below may reference images not displayed]

ACR Breast Density Category c: The breast tissue is heterogeneously
dense, which may obscure small masses
FINDINGS: There are no findings suspicious for malignancy.
IMPRESSION: No mammographic evidence of malignancy. A result letter of this
screening mammogram will be mailed directly to the patient.

RECOMMENDATION:
Screening mammogram in one year. (Code:C8-T-HNK)

BI-RADS CATEGORY  1: Negative.

## 2022-02-28 DIAGNOSIS — I5022 Chronic systolic (congestive) heart failure: Secondary | ICD-10-CM | POA: Diagnosis not present

## 2022-02-28 DIAGNOSIS — I10 Essential (primary) hypertension: Secondary | ICD-10-CM | POA: Diagnosis not present

## 2022-03-26 DIAGNOSIS — I480 Paroxysmal atrial fibrillation: Secondary | ICD-10-CM | POA: Diagnosis not present

## 2022-03-26 DIAGNOSIS — Z0001 Encounter for general adult medical examination with abnormal findings: Secondary | ICD-10-CM | POA: Diagnosis not present

## 2022-03-26 DIAGNOSIS — F17200 Nicotine dependence, unspecified, uncomplicated: Secondary | ICD-10-CM | POA: Diagnosis not present

## 2022-03-26 DIAGNOSIS — M1990 Unspecified osteoarthritis, unspecified site: Secondary | ICD-10-CM | POA: Diagnosis not present

## 2022-03-26 DIAGNOSIS — Z1331 Encounter for screening for depression: Secondary | ICD-10-CM | POA: Diagnosis not present

## 2022-03-26 DIAGNOSIS — Z23 Encounter for immunization: Secondary | ICD-10-CM | POA: Diagnosis not present

## 2022-03-26 DIAGNOSIS — G4700 Insomnia, unspecified: Secondary | ICD-10-CM | POA: Diagnosis not present

## 2022-03-26 DIAGNOSIS — Z1389 Encounter for screening for other disorder: Secondary | ICD-10-CM | POA: Diagnosis not present

## 2022-03-26 DIAGNOSIS — M1000 Idiopathic gout, unspecified site: Secondary | ICD-10-CM | POA: Diagnosis not present

## 2022-03-29 DIAGNOSIS — I69321 Dysphasia following cerebral infarction: Secondary | ICD-10-CM | POA: Diagnosis not present

## 2022-03-29 DIAGNOSIS — I639 Cerebral infarction, unspecified: Secondary | ICD-10-CM | POA: Diagnosis not present

## 2022-03-29 DIAGNOSIS — Z931 Gastrostomy status: Secondary | ICD-10-CM | POA: Diagnosis not present

## 2022-03-29 DIAGNOSIS — G9341 Metabolic encephalopathy: Secondary | ICD-10-CM | POA: Diagnosis not present

## 2022-04-02 DIAGNOSIS — E43 Unspecified severe protein-calorie malnutrition: Secondary | ICD-10-CM | POA: Diagnosis not present

## 2022-04-02 DIAGNOSIS — I10 Essential (primary) hypertension: Secondary | ICD-10-CM | POA: Diagnosis not present

## 2022-04-02 DIAGNOSIS — Z0001 Encounter for general adult medical examination with abnormal findings: Secondary | ICD-10-CM | POA: Diagnosis not present

## 2022-04-26 DIAGNOSIS — I10 Essential (primary) hypertension: Secondary | ICD-10-CM | POA: Diagnosis not present

## 2022-04-26 DIAGNOSIS — I5022 Chronic systolic (congestive) heart failure: Secondary | ICD-10-CM | POA: Diagnosis not present

## 2022-05-25 DIAGNOSIS — I10 Essential (primary) hypertension: Secondary | ICD-10-CM | POA: Diagnosis not present

## 2022-05-25 DIAGNOSIS — I5022 Chronic systolic (congestive) heart failure: Secondary | ICD-10-CM | POA: Diagnosis not present

## 2022-06-25 DIAGNOSIS — I5022 Chronic systolic (congestive) heart failure: Secondary | ICD-10-CM | POA: Diagnosis not present

## 2022-06-25 DIAGNOSIS — I10 Essential (primary) hypertension: Secondary | ICD-10-CM | POA: Diagnosis not present

## 2022-07-25 DIAGNOSIS — I5022 Chronic systolic (congestive) heart failure: Secondary | ICD-10-CM | POA: Diagnosis not present

## 2022-07-25 DIAGNOSIS — I10 Essential (primary) hypertension: Secondary | ICD-10-CM | POA: Diagnosis not present

## 2022-08-25 DIAGNOSIS — I5022 Chronic systolic (congestive) heart failure: Secondary | ICD-10-CM | POA: Diagnosis not present

## 2022-08-25 DIAGNOSIS — I10 Essential (primary) hypertension: Secondary | ICD-10-CM | POA: Diagnosis not present

## 2022-09-23 DIAGNOSIS — J42 Unspecified chronic bronchitis: Secondary | ICD-10-CM | POA: Diagnosis not present

## 2022-09-23 DIAGNOSIS — F17210 Nicotine dependence, cigarettes, uncomplicated: Secondary | ICD-10-CM | POA: Diagnosis not present

## 2022-09-23 DIAGNOSIS — I10 Essential (primary) hypertension: Secondary | ICD-10-CM | POA: Diagnosis not present

## 2022-09-23 DIAGNOSIS — F17200 Nicotine dependence, unspecified, uncomplicated: Secondary | ICD-10-CM | POA: Diagnosis not present

## 2022-09-23 DIAGNOSIS — I480 Paroxysmal atrial fibrillation: Secondary | ICD-10-CM | POA: Diagnosis not present

## 2022-10-04 ENCOUNTER — Inpatient Hospital Stay (HOSPITAL_COMMUNITY)
Admission: EM | Admit: 2022-10-04 | Discharge: 2022-10-09 | DRG: 389 | Disposition: A | Discharge status: Home / Self Care | Payer: 59 | Attending: Family Medicine | Admitting: Family Medicine

## 2022-10-04 ENCOUNTER — Emergency Department (HOSPITAL_COMMUNITY): Payer: 59

## 2022-10-04 ENCOUNTER — Encounter (HOSPITAL_COMMUNITY): Payer: Self-pay | Admitting: *Deleted

## 2022-10-04 ENCOUNTER — Inpatient Hospital Stay (HOSPITAL_COMMUNITY): Payer: 59

## 2022-10-04 ENCOUNTER — Other Ambulatory Visit: Payer: Self-pay

## 2022-10-04 DIAGNOSIS — K56609 Unspecified intestinal obstruction, unspecified as to partial versus complete obstruction: Secondary | ICD-10-CM | POA: Diagnosis not present

## 2022-10-04 DIAGNOSIS — E876 Hypokalemia: Secondary | ICD-10-CM | POA: Diagnosis not present

## 2022-10-04 DIAGNOSIS — I69331 Monoplegia of upper limb following cerebral infarction affecting right dominant side: Secondary | ICD-10-CM

## 2022-10-04 DIAGNOSIS — R140 Abdominal distension (gaseous): Secondary | ICD-10-CM | POA: Diagnosis not present

## 2022-10-04 DIAGNOSIS — R54 Age-related physical debility: Secondary | ICD-10-CM | POA: Diagnosis not present

## 2022-10-04 DIAGNOSIS — D72829 Elevated white blood cell count, unspecified: Secondary | ICD-10-CM | POA: Diagnosis not present

## 2022-10-04 DIAGNOSIS — Z7902 Long term (current) use of antithrombotics/antiplatelets: Secondary | ICD-10-CM | POA: Diagnosis not present

## 2022-10-04 DIAGNOSIS — Z79899 Other long term (current) drug therapy: Secondary | ICD-10-CM

## 2022-10-04 DIAGNOSIS — I10 Essential (primary) hypertension: Secondary | ICD-10-CM | POA: Diagnosis present

## 2022-10-04 DIAGNOSIS — Z90710 Acquired absence of both cervix and uterus: Secondary | ICD-10-CM | POA: Diagnosis not present

## 2022-10-04 DIAGNOSIS — Z7982 Long term (current) use of aspirin: Secondary | ICD-10-CM | POA: Diagnosis not present

## 2022-10-04 DIAGNOSIS — I639 Cerebral infarction, unspecified: Secondary | ICD-10-CM | POA: Diagnosis not present

## 2022-10-04 DIAGNOSIS — R64 Cachexia: Secondary | ICD-10-CM | POA: Diagnosis present

## 2022-10-04 DIAGNOSIS — R636 Underweight: Secondary | ICD-10-CM | POA: Diagnosis not present

## 2022-10-04 DIAGNOSIS — F17210 Nicotine dependence, cigarettes, uncomplicated: Secondary | ICD-10-CM | POA: Diagnosis present

## 2022-10-04 DIAGNOSIS — K6389 Other specified diseases of intestine: Secondary | ICD-10-CM | POA: Diagnosis not present

## 2022-10-04 DIAGNOSIS — R112 Nausea with vomiting, unspecified: Secondary | ICD-10-CM | POA: Diagnosis not present

## 2022-10-04 DIAGNOSIS — K5651 Intestinal adhesions [bands], with partial obstruction: Principal | ICD-10-CM | POA: Diagnosis present

## 2022-10-04 DIAGNOSIS — Z4682 Encounter for fitting and adjustment of non-vascular catheter: Secondary | ICD-10-CM | POA: Diagnosis not present

## 2022-10-04 DIAGNOSIS — Z681 Body mass index (BMI) 19 or less, adult: Secondary | ICD-10-CM | POA: Diagnosis not present

## 2022-10-04 DIAGNOSIS — R1110 Vomiting, unspecified: Secondary | ICD-10-CM | POA: Diagnosis not present

## 2022-10-04 DIAGNOSIS — Z931 Gastrostomy status: Secondary | ICD-10-CM | POA: Insufficient documentation

## 2022-10-04 DIAGNOSIS — R1084 Generalized abdominal pain: Secondary | ICD-10-CM | POA: Diagnosis not present

## 2022-10-04 DIAGNOSIS — R1033 Periumbilical pain: Secondary | ICD-10-CM | POA: Diagnosis not present

## 2022-10-04 DIAGNOSIS — R109 Unspecified abdominal pain: Secondary | ICD-10-CM | POA: Diagnosis not present

## 2022-10-04 LAB — COMPREHENSIVE METABOLIC PANEL
ALT: 27 U/L (ref 0–44)
AST: 29 U/L (ref 15–41)
Albumin: 5 g/dL (ref 3.5–5.0)
Alkaline Phosphatase: 49 U/L (ref 38–126)
Anion gap: 15 (ref 5–15)
BUN: 22 mg/dL (ref 8–23)
CO2: 34 mmol/L — ABNORMAL HIGH (ref 22–32)
Calcium: 11.1 mg/dL — ABNORMAL HIGH (ref 8.9–10.3)
Chloride: 87 mmol/L — ABNORMAL LOW (ref 98–111)
Creatinine, Ser: 1.01 mg/dL — ABNORMAL HIGH (ref 0.44–1.00)
GFR, Estimated: >60 mL/min (ref >60)
Glucose, Bld: 138 mg/dL — ABNORMAL HIGH (ref 70–99)
Potassium: 3.9 mmol/L (ref 3.5–5.1)
Sodium: 136 mmol/L (ref 135–145)
Total Bilirubin: 0.6 mg/dL (ref 0.3–1.2)
Total Protein: 8.5 g/dL — ABNORMAL HIGH (ref 6.5–8.1)

## 2022-10-04 LAB — I-STAT CHEM 8, ED
BUN: 23 mg/dL (ref 8–23)
Calcium, Ion: 1.25 mmol/L (ref 1.15–1.40)
Chloride: 90 mmol/L — ABNORMAL LOW (ref 98–111)
Creatinine, Ser: 1 mg/dL (ref 0.44–1.00)
Glucose, Bld: 137 mg/dL — ABNORMAL HIGH (ref 70–99)
HCT: 52 % — ABNORMAL HIGH (ref 36.0–46.0)
Hemoglobin: 17.7 g/dL — ABNORMAL HIGH (ref 12.0–15.0)
Potassium: 3.9 mmol/L (ref 3.5–5.1)
Sodium: 136 mmol/L (ref 135–145)
TCO2: 39 mmol/L — ABNORMAL HIGH (ref 22–32)

## 2022-10-04 LAB — CBC WITH DIFFERENTIAL/PLATELET
Abs Immature Granulocytes: 0.08 10*3/uL — ABNORMAL HIGH (ref 0.00–0.07)
Basophils Absolute: 0 10*3/uL (ref 0.0–0.1)
Basophils Relative: 0 %
Eosinophils Absolute: 0 10*3/uL (ref 0.0–0.5)
Eosinophils Relative: 0 %
HCT: 48.4 % — ABNORMAL HIGH (ref 36.0–46.0)
Hemoglobin: 16.8 g/dL — ABNORMAL HIGH (ref 12.0–15.0)
Immature Granulocytes: 1 %
Lymphocytes Relative: 7 %
Lymphs Abs: 1.1 10*3/uL (ref 0.7–4.0)
MCH: 32.2 pg (ref 26.0–34.0)
MCHC: 34.7 g/dL (ref 30.0–36.0)
MCV: 92.7 fL (ref 80.0–100.0)
Monocytes Absolute: 0.8 10*3/uL (ref 0.1–1.0)
Monocytes Relative: 5 %
Neutro Abs: 15 10*3/uL — ABNORMAL HIGH (ref 1.7–7.7)
Neutrophils Relative %: 87 %
Platelets: 335 10*3/uL (ref 150–400)
RBC: 5.22 MIL/uL — ABNORMAL HIGH (ref 3.87–5.11)
RDW: 13.2 % (ref 11.5–15.5)
WBC: 17.1 10*3/uL — ABNORMAL HIGH (ref 4.0–10.5)
nRBC: 0 % (ref 0.0–0.2)

## 2022-10-04 LAB — LIPASE, BLOOD: Lipase: 51 U/L (ref 11–51)

## 2022-10-04 MED ORDER — PANTOPRAZOLE SODIUM 40 MG IV SOLR
40.0000 mg | INTRAVENOUS | Status: DC
  Administered 2022-10-05 – 2022-10-08 (×4): 40 mg via INTRAVENOUS
  Filled 2022-10-04 (×4): qty 10

## 2022-10-04 MED ORDER — MORPHINE SULFATE (PF) 4 MG/ML IV SOLN: 2.0000 mg | Freq: Once | INTRAVENOUS | Status: DC

## 2022-10-04 MED ORDER — ONDANSETRON HCL 4 MG/2ML IJ SOLN: 4.0000 mg | Freq: Four times a day (QID) | INTRAMUSCULAR | Status: DC | PRN

## 2022-10-04 MED ORDER — CLONIDINE HCL 0.1 MG/24HR TD PTWK
0.1000 mg | MEDICATED_PATCH | TRANSDERMAL | Status: DC
  Administered 2022-10-04: 0.1 mg via TRANSDERMAL
  Filled 2022-10-04: qty 1

## 2022-10-04 MED ORDER — DEXTROSE-SODIUM CHLORIDE 5-0.9 % IV SOLN: INTRAVENOUS | Status: DC

## 2022-10-04 MED ORDER — FENTANYL CITRATE PF 50 MCG/ML IJ SOSY
25.0000 ug | PREFILLED_SYRINGE | INTRAMUSCULAR | Status: DC | PRN
  Administered 2022-10-05 – 2022-10-07 (×7): 25 ug via INTRAVENOUS
  Filled 2022-10-04 (×8): qty 1

## 2022-10-04 MED ORDER — TRAZODONE HCL 50 MG PO TABS
50.0000 mg | ORAL_TABLET | Freq: Every evening | ORAL | Status: DC | PRN
  Administered 2022-10-05 – 2022-10-09 (×3): 50 mg via ORAL
  Filled 2022-10-04 (×3): qty 1

## 2022-10-04 MED ORDER — SODIUM CHLORIDE 0.9 % IV SOLN: 1000.0000 mL | INTRAVENOUS | Status: DC

## 2022-10-04 MED ORDER — BISACODYL 10 MG RE SUPP: 10.0000 mg | Freq: Once | RECTAL | Status: DC

## 2022-10-04 MED ORDER — BISACODYL 10 MG RE SUPP: 10.0000 mg | Freq: Every day | RECTAL | Status: DC | PRN

## 2022-10-04 MED ORDER — ACETAMINOPHEN 325 MG PO TABS
650.0000 mg | ORAL_TABLET | Freq: Four times a day (QID) | ORAL | Status: DC | PRN
  Administered 2022-10-08: 650 mg via ORAL
  Filled 2022-10-04: qty 2

## 2022-10-04 MED ORDER — SODIUM CHLORIDE 0.9 % IV BOLUS (SEPSIS): 500.0000 mL | Freq: Once | INTRAVENOUS | Status: DC

## 2022-10-04 MED ORDER — SODIUM CHLORIDE 0.9 % IV SOLN: INTRAVENOUS | Status: DC | PRN

## 2022-10-04 MED ORDER — SODIUM CHLORIDE 0.9% FLUSH
3.0000 mL | Freq: Two times a day (BID) | INTRAVENOUS | Status: DC
  Administered 2022-10-04 – 2022-10-08 (×9): 3 mL via INTRAVENOUS

## 2022-10-04 MED ORDER — ASPIRIN 81 MG PO TBEC
81.0000 mg | DELAYED_RELEASE_TABLET | Freq: Every day | ORAL | Status: DC
  Filled 2022-10-04: qty 1

## 2022-10-04 MED ORDER — ACETAMINOPHEN 650 MG RE SUPP: 650.0000 mg | Freq: Four times a day (QID) | RECTAL | Status: DC | PRN

## 2022-10-04 MED ORDER — LIDOCAINE VISCOUS HCL 2 % MT SOLN
15.0000 mL | Freq: Once | OROMUCOSAL | Status: AC
  Administered 2022-10-04: 15 mL via OROMUCOSAL
  Filled 2022-10-04: qty 15

## 2022-10-04 MED ORDER — HEPARIN SODIUM (PORCINE) 5000 UNIT/ML IJ SOLN
5000.0000 [IU] | Freq: Three times a day (TID) | INTRAMUSCULAR | Status: DC
  Administered 2022-10-04 – 2022-10-09 (×14): 5000 [IU] via SUBCUTANEOUS
  Filled 2022-10-04 (×14): qty 1

## 2022-10-04 MED ORDER — NICOTINE 21 MG/24HR TD PT24
21.0000 mg | MEDICATED_PATCH | Freq: Every day | TRANSDERMAL | Status: DC
  Administered 2022-10-04 – 2022-10-08 (×5): 21 mg via TRANSDERMAL
  Filled 2022-10-04 (×6): qty 1

## 2022-10-04 MED ORDER — ATORVASTATIN CALCIUM 40 MG PO TABS
40.0000 mg | ORAL_TABLET | Freq: Every day | ORAL | Status: DC
  Filled 2022-10-04: qty 1

## 2022-10-04 MED ORDER — SODIUM CHLORIDE 0.9% FLUSH
3.0000 mL | INTRAVENOUS | Status: DC | PRN
  Administered 2022-10-08: 3 mL via INTRAVENOUS

## 2022-10-04 MED ORDER — ONDANSETRON HCL 4 MG/2ML IJ SOLN
4.0000 mg | Freq: Once | INTRAMUSCULAR | Status: AC
  Administered 2022-10-04: 4 mg via INTRAVENOUS
  Filled 2022-10-04: qty 2

## 2022-10-04 MED ORDER — IOHEXOL 300 MG/ML  SOLN
100.0000 mL | Freq: Once | INTRAMUSCULAR | Status: AC | PRN
  Administered 2022-10-04: 60 mL via INTRAVENOUS

## 2022-10-04 MED ORDER — HYDRALAZINE HCL 20 MG/ML IJ SOLN
10.0000 mg | Freq: Four times a day (QID) | INTRAMUSCULAR | Status: DC | PRN
  Administered 2022-10-04 – 2022-10-05 (×3): 10 mg via INTRAVENOUS
  Filled 2022-10-04 (×4): qty 1

## 2022-10-04 MED ORDER — POLYETHYLENE GLYCOL 3350 17 G PO PACK: 17.0000 g | PACK | Freq: Every day | ORAL | Status: DC | PRN

## 2022-10-04 MED ORDER — MORPHINE SULFATE (PF) 4 MG/ML IV SOLN
2.0000 mg | Freq: Once | INTRAVENOUS | Status: AC
  Administered 2022-10-04: 2 mg via INTRAVENOUS
  Filled 2022-10-04: qty 1

## 2022-10-04 MED ORDER — ONDANSETRON HCL 4 MG PO TABS: 4.0000 mg | ORAL_TABLET | Freq: Four times a day (QID) | ORAL | Status: DC | PRN

## 2022-10-04 MED ORDER — SODIUM CHLORIDE 0.9% FLUSH
3.0000 mL | Freq: Two times a day (BID) | INTRAVENOUS | Status: DC
  Administered 2022-10-04 – 2022-10-08 (×6): 3 mL via INTRAVENOUS

## 2022-10-04 NOTE — ED Provider Notes (Signed)
[place] [facility]MENT AT [facility] Provider Note   CSN: [id number] Arrival date & time: 10/04/22  1152     History  Chief Complaint  Patient presents with   Abdominal Pain    Kendra Lee is a 67 y.o. female.  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  Says that last night she started experiencing nonbloody nonbilious emesis.  Approximately 12 episodes.  Is also having periumbilical abdominal pain.  Last bowel movement was yesterday.  Does not think she is passing gas but does not have any abdominal distention.  Has a history of a hysterectomy and a G-tube that was placed after her stroke.  Still smokes.  No marijuana or other recreational drug use.  No significant alcohol use.       Home Medications Prior to Admission medications   Medication Sig Start Date End Date Taking? Authorizing Provider  amLODipine (NORVASC) 5 MG tablet Place 1 tablet (5 mg total) into feeding tube daily. 09/24/20   Catarina Hartshorn, MD  aspirin 81 MG chewable tablet Place 1 tablet (81 mg total) into feeding tube daily. 09/24/20   Catarina Hartshorn, MD  atorvastatin (LIPITOR) 80 MG tablet Place 1 tablet (80 mg total) into feeding tube daily. 09/24/20   Catarina Hartshorn, MD  clopidogrel (PLAVIX) 75 MG tablet Place 1 tablet (75 mg total) into feeding tube daily. 09/24/20   Catarina Hartshorn, MD  hydrALAZINE (APRESOLINE) 10 MG tablet Take 1 tablet (10 mg total) by mouth every 8 (eight) hours. 09/24/20   Catarina Hartshorn, MD  losartan (COZAAR) 25 MG tablet Place 1 tablet (25 mg total) into feeding tube daily. 09/24/20   Catarina Hartshorn, MD  Nutritional Supplements (FEEDING SUPPLEMENT, OSMOLITE 1.2 CAL,) LIQD Infuse at 55 cc/hour via pump 09/24/20   Tat, David, MD  thiamine 100 MG tablet Place 1 tablet (100 mg total) into feeding tube daily. 09/24/20   Catarina Hartshorn,  MD  Water For Irrigation, Sterile (FREE WATER) SOLN Place 200 mLs into feeding tube every 8 (eight) hours. 09/24/20   Catarina Hartshorn, MD      Allergies    Patient has no known allergies.    Review of Systems   Review of Systems  Physical Exam Updated Vital Signs BP (!) 178/94 (BP Location: Left Arm)   Pulse 80   Temp 98.1 F (36.7 C) (Oral)   Resp 16   Ht 5\' 1"  (1.549 m)   Wt 44 kg   LMP  (LMP Unknown)   SpO2 92%   BMI 18.33 kg/m  Physical Exam Vitals and nursing note reviewed.  Constitutional:      General: She is not in acute distress.    Appearance: She is well-developed.  HENT:     Head: Normocephalic and atraumatic.     Right Ear: External ear normal.     Left Ear: External ear normal.     Nose: Nose normal.  Eyes:     Extraocular Movements: Extraocular movements intact.     Conjunctiva/sclera: Conjunctivae normal.     Pupils: Pupils are equal, round, and reactive to light.  Cardiovascular:     Rate and Rhythm: Normal rate and regular rhythm.  Pulmonary:     Effort: Pulmonary effort is normal. No respiratory distress.  Abdominal:     General: Abdomen is flat. There is no distension.  Palpations: Abdomen is soft. There is no mass.     Tenderness: There is abdominal tenderness (Periumbilical). There is no guarding.  Musculoskeletal:     Cervical back: Normal range of motion and neck supple.  Skin:    General: Skin is warm and dry.  Neurological:     Mental Status: She is alert and oriented to person, place, and time. Mental status is at baseline.  Psychiatric:        Mood and Affect: Mood normal.     ED Results / Procedures / Treatments   Labs (all labs ordered are listed, but only abnormal results are displayed) Labs Reviewed  COMPREHENSIVE METABOLIC PANEL - Abnormal; Notable for the following components:      Result Value   Chloride 87 (*)    CO2 34 (*)    Glucose, Bld 138 (*)    Creatinine, Ser 1.01 (*)    Calcium 11.1 (*)    Total Protein 8.5 (*)     All other components within normal limits  CBC WITH DIFFERENTIAL/PLATELET - Abnormal; Notable for the following components:   WBC 17.1 (*)    RBC 5.22 (*)    Hemoglobin 16.8 (*)    HCT 48.4 (*)    Neutro Abs 15.0 (*)    Abs Immature Granulocytes 0.08 (*)    All other components within normal limits  I-STAT CHEM 8, ED - Abnormal; Notable for the following components:   Chloride 90 (*)    Glucose, Bld 137 (*)    TCO2 39 (*)    Hemoglobin 17.7 (*)    HCT 52.0 (*)    All other components within normal limits  LIPASE, BLOOD  URINALYSIS, ROUTINE W REFLEX MICROSCOPIC  HIV ANTIBODY (ROUTINE TESTING W REFLEX)    EKG EKG Interpretation Date/Time:  Friday October 04 2022 12:50:09 EDT Ventricular Rate:  85 PR Interval:  148 QRS Duration:  90 QT Interval:  396 QTC Calculation: 471 R Axis:   68  Text Interpretation: Sinus rhythm Limited due to motion artifact.  Nonspecific ST changes in the inferior leads Confirmed by Vonita Moss [phone]) on 10/04/2022 1:11:38 PM  Radiology DG Chest Portable 1 View  Result Date: 10/04/2022 CLINICAL DATA:  NG tube placement. Abdominal pain with nausea and vomiting since last night. EXAM: PORTABLE CHEST 1 VIEW COMPARISON:  10/08/2020 FINDINGS: Heart size and pulmonary vascularity are normal for technique. Mild scarring and calcified granulomas in the upper lungs, likely postinflammatory. No airspace disease or consolidation in the lungs. No pleural effusions. No pneumothorax. Mediastinal contours appear intact. An enteric tube has been placed with tip projecting over the left upper quadrant consistent with location in the body of the stomach. Residual contrast material in the renal collecting systems. IMPRESSION: 1. No evidence of active pulmonary disease. 2. Enteric tube tip projects over the left upper quadrant consistent with location in the body of the stomach. Electronically Signed   By: Burman Nieves M.D.   On: 10/04/2022 15:31   CT ABDOMEN PELVIS  W CONTRAST  Result Date: 10/04/2022 CLINICAL DATA:  One day history of periumbilical abdominal pain associated with nausea and vomiting EXAM: CT ABDOMEN AND PELVIS WITH CONTRAST TECHNIQUE: Multidetector CT imaging of the abdomen and pelvis was performed using the standard protocol following bolus administration of intravenous contrast. RADIATION DOSE REDUCTION: This exam was performed according to the departmental dose-optimization program which includes automated exposure control, adjustment of the mA and/or kV according to patient size and/or use of iterative reconstruction technique.  CONTRAST:  60mL OMNIPAQUE IOHEXOL 300 MG/ML  SOLN COMPARISON:  CT abdomen and pelvis dated 09/21/2020 FINDINGS: Lower chest: No focal consolidation or pulmonary nodule in the lung bases. No pleural effusion or pneumothorax demonstrated. Partially imaged heart size is normal. Coronary artery calcifications. Hepatobiliary: No focal hepatic lesions. No intra or extrahepatic biliary ductal dilation. Normal gallbladder. Pancreas: No focal lesions or main ductal dilation. Spleen: Normal in size without focal abnormality. Adrenals/Urinary Tract: No adrenal nodules. Atrophic left kidney. No suspicious renal mass, calculi or hydronephrosis. No focal bladder wall thickening. Stomach/Bowel: Normal appearance of the stomach. Marked small bowel dilation with abrupt caliber transition in the right lower quadrant (2:44, 5:60). Moderate volume stool within the ascending and transverse colon. The descending colon is decompressed. Appendix is not discretely seen. Vascular/Lymphatic: Aortic atherosclerosis. No enlarged abdominal or pelvic lymph nodes. Reproductive: No adnexal masses. Other: Trace free fluid.  No free air or fluid collection. Musculoskeletal: No acute or abnormal lytic or blastic osseous lesions. IMPRESSION: 1. Marked small bowel dilation with abrupt caliber transition in the right lower quadrant, consistent with small-bowel  obstruction. 2. Moderate volume stool within the ascending and transverse colon. 3. Atrophic left kidney. 4. Aortic Atherosclerosis (ICD10-I70.0). Coronary artery calcifications. Assessment for potential risk factor modification, dietary therapy or pharmacologic therapy may be warranted, if clinically indicated. Electronically Signed   By: Agustin Cree M.D.   On: 10/04/2022 14:00    Procedures Procedures    Medications Ordered in ED Medications  bisacodyl (DULCOLAX) suppository 10 mg (has no administration in time range)  hydrALAZINE (APRESOLINE) injection 10 mg (has no administration in time range)  pantoprazole (PROTONIX) injection 40 mg (has no administration in time range)  morphine (PF) 4 MG/ML injection 2 mg (has no administration in time range)  sodium chloride 0.9 % bolus 500 mL (has no administration in time range)    Followed by  0.9 %  sodium chloride infusion (has no administration in time range)  dextrose 5 %-0.9 % sodium chloride infusion (has no administration in time range)  sodium chloride flush (NS) 0.9 % injection 3 mL (has no administration in time range)  sodium chloride flush (NS) 0.9 % injection 3 mL (has no administration in time range)  sodium chloride flush (NS) 0.9 % injection 3 mL (has no administration in time range)  0.9 %  sodium chloride infusion (has no administration in time range)  acetaminophen (TYLENOL) tablet 650 mg (has no administration in time range)    Or  acetaminophen (TYLENOL) suppository 650 mg (has no administration in time range)  traZODone (DESYREL) tablet 50 mg (has no administration in time range)  polyethylene glycol (MIRALAX / GLYCOLAX) packet 17 g (has no administration in time range)  bisacodyl (DULCOLAX) suppository 10 mg (has no administration in time range)  ondansetron (ZOFRAN) tablet 4 mg (has no administration in time range)    Or  ondansetron (ZOFRAN) injection 4 mg (has no administration in time range)  heparin injection 5,000  Units (has no administration in time range)  fentaNYL (SUBLIMAZE) injection 25 mcg (has no administration in time range)  ondansetron (ZOFRAN) injection 4 mg (4 mg Intravenous Given 10/04/22 1309)  morphine (PF) 4 MG/ML injection 2 mg (2 mg Intravenous Given 10/04/22 1309)  iohexol (OMNIPAQUE) 300 MG/ML solution 100 mL (60 mLs Intravenous Contrast Given 10/04/22 1325)  lidocaine (XYLOCAINE) 2 % viscous mouth solution 15 mL (15 mLs Mouth/Throat Given 10/04/22 1500)    ED Course/ Medical Decision Making/ A&P Clinical Course as of  10/04/22 1630  Fri Oct 04, 2022  1405 Dr Henreitta Leber from general surgery consulted.  Will see the patient shortly. [RP]  1427 Dr Mariea Clonts to admit to hospital service [RP]    Clinical Course User Index [RP] Rondel Baton, MD                             Medical Decision Making Amount and/or Complexity of Data Reviewed Labs: ordered. Radiology: ordered.  Risk Prescription drug management. Decision regarding hospitalization.   Saje Swearingen is a 67 y.o. female with comorbidities that complicate the patient evaluation including multiple abdominal surgeries presents emergency department nausea and vomiting and decreased flatus  Initial Ddx:  Small bowel obstruction, ileus, gastroenteritis, appendicitis  MDM/Course:  Patient arrives with nausea and vomiting and decreased flatus.  Does have history of multiple abdominal surgeries as well which predisposes her to small bowel obstruction.  On exam does have some periumbilical tenderness to palpation but no significant distention or swelling.  Was given morphine and Zofran for pain.  She had a CT scan that does show evidence of a small bowel obstruction.  General surgery was consulted who recommended NG tube and admission to hospitalist.  Upon re-evaluation patient was feeling much better after the morphine and Zofran.  NG tube was placed and hospitalist was consulted for admission.  This patient presents to the ED  for concern of complaints listed in HPI, this involves an extensive number of treatment options, and is a complaint that carries with it a high risk of complications and morbidity. Disposition including potential need for admission considered.   Dispo: Admit to Floor  Records reviewed Outpatient Clinic Notes The following labs were independently interpreted: Chemistry and show no acute abnormality I independently reviewed the following imaging with scope of interpretation limited to determining acute life threatening conditions related to emergency care: CT Abdomen/Pelvis and agree with the radiologist interpretation with the following exceptions: none I personally reviewed and interpreted cardiac monitoring: normal sinus rhythm  I personally reviewed and interpreted the pt's EKG: see above for interpretation  I have reviewed the patients home medications and made adjustments as needed Consults: General Surgery and Hospitalist Social Determinants of health:  Elderly, tobacco use         Final Clinical Impression(s) / ED Diagnoses Final diagnoses:  Small bowel obstruction (HCC)  Nausea and vomiting, unspecified vomiting type  Periumbilical abdominal pain    Rx / DC Orders ED Discharge Orders     None         Rondel Baton, MD 10/04/22 1631

## 2022-10-04 NOTE — ED Triage Notes (Signed)
Pt with abd pain with N/V since last night.  Denies any diarrhea. LBM  last night and normal per pt.

## 2022-10-04 NOTE — Consult Note (Signed)
[facility] Consult  Reason for Consult: SBO Referring Physician: ED   Chief Complaint   Abdominal Pain     HPI: Dale Hochstetler is a 67 y.o. female with a history of CVA and prior G tube for feeds who comes in with abdominal pain and and nausea/vomiting. She had a BM last night. She reported 12 episodes of vomiting. She had a CT scan that demonstrates a SBO With a transition in the RLQ.    Past Medical History:  Diagnosis Date   CVA (cerebral vascular accident) (HCC)    Dysphagia    Hypertension    Hypokalemia    Smoker    2 ppd x 55 years    Past Surgical History:  Procedure Laterality Date   ABDOMINAL HYSTERECTOMY     late 1980s   IR GASTROSTOMY TUBE MOD SED  09/22/2020   IR GASTROSTOMY TUBE MOD SED  10/04/2020    History reviewed. No pertinent family history.  Social History   Tobacco Use   Smoking status: Every Day    Current packs/day: 1.00    Types: Cigarettes   Smokeless tobacco: Never  Substance Use Topics   Alcohol use: Never   Drug use: Never    Medications: I have reviewed the patient's current medications. Current Facility-Administered Medications  Medication Dose Route Frequency Provider Last Rate Last Admin   sodium chloride 0.9 % bolus 500 mL  500 mL Intravenous Once Emokpae, Courage, MD       Followed by   0.9 %  sodium chloride infusion  1,000 mL Intravenous Continuous Emokpae, Courage, MD       0.9 %  sodium chloride infusion   Intravenous PRN Mariea Clonts, Courage, MD       acetaminophen (TYLENOL) tablet 650 mg  650 mg Oral Q6H PRN Emokpae, Courage, MD       Or   acetaminophen (TYLENOL) suppository 650 mg  650 mg Rectal Q6H PRN Emokpae, Courage, MD       bisacodyl (DULCOLAX) suppository 10 mg  10 mg Rectal Once Emokpae, Courage, MD       bisacodyl (DULCOLAX) suppository 10 mg  10 mg Rectal Daily PRN Emokpae, Courage, MD       dextrose 5 %-0.9 % sodium chloride infusion   Intravenous Continuous Emokpae, Courage, MD        fentaNYL (SUBLIMAZE) injection 25 mcg  25 mcg Intravenous Q2H PRN Emokpae, Courage, MD       heparin injection 5,000 Units  5,000 Units Subcutaneous Q8H Emokpae, Courage, MD       hydrALAZINE (APRESOLINE) injection 10 mg  10 mg Intravenous Q6H PRN Emokpae, Courage, MD       lidocaine (XYLOCAINE) 2 % viscous mouth solution 15 mL  15 mL Mouth/Throat Once Emokpae, Courage, MD       morphine (PF) 4 MG/ML injection 2 mg  2 mg Intravenous Once Emokpae, Courage, MD       ondansetron (ZOFRAN) tablet 4 mg  4 mg Oral Q6H PRN Emokpae, Courage, MD       Or   ondansetron (ZOFRAN) injection 4 mg  4 mg Intravenous Q6H PRN Emokpae, Courage, MD       pantoprazole (PROTONIX) injection 40 mg  40 mg Intravenous Q24H Emokpae, Courage, MD       polyethylene glycol (MIRALAX / GLYCOLAX) packet 17 g  17 g Oral Daily PRN Emokpae, Courage, MD       sodium chloride flush (NS) 0.9 % injection 3 mL  3 mL Intravenous Q12H Emokpae, Courage, MD       sodium chloride flush (NS) 0.9 % injection 3 mL  3 mL Intravenous Q12H Emokpae, Courage, MD       sodium chloride flush (NS) 0.9 % injection 3 mL  3 mL Intravenous PRN Emokpae, Courage, MD       traZODone (DESYREL) tablet 50 mg  50 mg Oral QHS PRN Emokpae, Courage, MD       Current Outpatient Medications  Medication Sig Dispense Refill Last Dose   amLODipine (NORVASC) 5 MG tablet Place 1 tablet (5 mg total) into feeding tube daily.      aspirin 81 MG chewable tablet Place 1 tablet (81 mg total) into feeding tube daily.      atorvastatin (LIPITOR) 80 MG tablet Place 1 tablet (80 mg total) into feeding tube daily.      clopidogrel (PLAVIX) 75 MG tablet Place 1 tablet (75 mg total) into feeding tube daily.      hydrALAZINE (APRESOLINE) 10 MG tablet Take 1 tablet (10 mg total) by mouth every 8 (eight) hours.      losartan (COZAAR) 25 MG tablet Place 1 tablet (25 mg total) into feeding tube daily.      Nutritional Supplements (FEEDING SUPPLEMENT, OSMOLITE 1.2 CAL,) LIQD Infuse at 55  cc/hour via pump 1000 mL 0    thiamine 100 MG tablet Place 1 tablet (100 mg total) into feeding tube daily.      Water For Irrigation, Sterile (FREE WATER) SOLN Place 200 mLs into feeding tube every 8 (eight) hours.        No Known Allergies   ROS:  A comprehensive review of systems was negative except for: Gastrointestinal: positive for abdominal pain, nausea, and vomiting  Blood pressure (!) 168/98, pulse 85, temperature 98.1 F (36.7 C), temperature source Oral, resp. rate 15, height 5\' 1"  (1.549 m), weight 44 kg, SpO2 91%. Physical Exam Vitals reviewed.  Constitutional:      Appearance: She is underweight.  Eyes:     Extraocular Movements: Extraocular movements intact.  Cardiovascular:     Rate and Rhythm: Normal rate.  Pulmonary:     Effort: Pulmonary effort is normal.  Abdominal:     General: There is distension.     Palpations: Abdomen is soft.     Tenderness: There is abdominal tenderness in the suprapubic area.  Skin:    General: Skin is warm.  Neurological:     General: No focal deficit present.     Mental Status: She is alert and oriented to person, place, and time.  Psychiatric:        Behavior: Behavior normal.     Results: Results for orders placed or performed during the hospital encounter of 10/04/22 (from the past 48 hour(s))  Comprehensive metabolic panel     Status: Abnormal   Collection Time: 10/04/22 12:54 PM  Result Value Ref Range   Sodium 136 135 - 145 mmol/L   Potassium 3.9 3.5 - 5.1 mmol/L   Chloride 87 (L) 98 - 111 mmol/L   CO2 34 (H) 22 - 32 mmol/L   Glucose, Bld 138 (H) 70 - 99 mg/dL    Comment: Glucose reference range applies only to samples taken after fasting for at least 8 hours.   BUN 22 8 - 23 mg/dL   Creatinine, Ser 8.65 (H) 0.44 - 1.00 mg/dL   Calcium 78.4 (H) 8.9 - 10.3 mg/dL   Total Protein 8.5 (H) 6.5 - 8.1 g/dL  Albumin 5.0 3.5 - 5.0 g/dL   AST 29 15 - 41 U/L   ALT 27 0 - 44 U/L   Alkaline Phosphatase 49 38 - 126 U/L    Total Bilirubin 0.6 0.3 - 1.2 mg/dL   GFR, Estimated >16 >10 mL/min    Comment: (NOTE) Calculated using the CKD-EPI Creatinine Equation (2021)    Anion gap 15 5 - 15    Comment: Performed at [facility], [address] [place], [place] [address]  Lipase, blood     Status: None   Collection Time: 10/04/22 12:54 PM  Result Value Ref Range   Lipase 51 11 - 51 U/L    Comment: Performed at [facility], [address], [place], [place] [address]  CBC with Diff     Status: Abnormal   Collection Time: 10/04/22 12:54 PM  Result Value Ref Range   WBC 17.1 (H) 4.0 - 10.5 K/uL   RBC 5.22 (H) 3.87 - 5.11 MIL/uL   Hemoglobin 16.8 (H) 12.0 - 15.0 g/dL   HCT 19.1 (H) 47.8 - 29.5 %   MCV 92.7 80.0 - 100.0 fL   MCH 32.2 26.0 - 34.0 pg   MCHC 34.7 30.0 - 36.0 g/dL   RDW 62.1 30.8 - 65.7 %   Platelets 335 150 - 400 K/uL   nRBC 0.0 0.0 - 0.2 %   Neutrophils Relative % 87 %   Neutro Abs 15.0 (H) 1.7 - 7.7 K/uL   Lymphocytes Relative 7 %   Lymphs Abs 1.1 0.7 - 4.0 K/uL   Monocytes Relative 5 %   Monocytes Absolute 0.8 0.1 - 1.0 K/uL   Eosinophils Relative 0 %   Eosinophils Absolute 0.0 0.0 - 0.5 K/uL   Basophils Relative 0 %   Basophils Absolute 0.0 0.0 - 0.1 K/uL   Immature Granulocytes 1 %   Abs Immature Granulocytes 0.08 (H) 0.00 - 0.07 K/uL    Comment: Performed at [facility], [address] [place], [place] [address]  I-stat chem 8, ED (not at [facility], [facility] or [facility])     Status: Abnormal   Collection Time: 10/04/22  1:01 PM  Result Value Ref Range   Sodium 136 135 - 145 mmol/L   Potassium 3.9 3.5 - 5.1 mmol/L   Chloride 90 (L) 98 - 111 mmol/L   BUN 23 8 - 23 mg/dL   Creatinine, Ser 2.95 0.44 - 1.00 mg/dL   Glucose, Bld 284 (H) 70 - 99 mg/dL    Comment: Glucose reference range applies only to samples taken after fasting for at least 8 hours.   Calcium, Ion 1.25 1.15 - 1.40 mmol/L   TCO2 39 (H) 22 - 32 mmol/L   Hemoglobin 17.7 (H) 12.0 - 15.0 g/dL   HCT 13.2 (H) 44.0 - 10.2 %    Personally reviewed- transition from dilated to decompressed bowel in RLQ, stool in colon  CT ABDOMEN PELVIS W CONTRAST  Result Date: 10/04/2022 CLINICAL DATA:  One day history of periumbilical abdominal pain associated with nausea and vomiting EXAM: CT ABDOMEN AND PELVIS WITH CONTRAST TECHNIQUE: Multidetector CT imaging of the abdomen and pelvis was performed using the standard protocol following bolus administration of intravenous contrast. RADIATION DOSE REDUCTION: This exam was performed according to the departmental dose-optimization program which includes automated exposure control, adjustment of the mA and/or kV according to patient size and/or use of iterative reconstruction technique. CONTRAST:  60mL OMNIPAQUE IOHEXOL 300 MG/ML  SOLN COMPARISON:  CT abdomen and pelvis dated 09/21/2020  FINDINGS: Lower chest: No focal consolidation or pulmonary nodule in the lung bases. No pleural effusion or pneumothorax demonstrated. Partially imaged heart size is normal. Coronary artery calcifications. Hepatobiliary: No focal hepatic lesions. No intra or extrahepatic biliary ductal dilation. Normal gallbladder. Pancreas: No focal lesions or main ductal dilation. Spleen: Normal in size without focal abnormality. Adrenals/Urinary Tract: No adrenal nodules. Atrophic left kidney. No suspicious renal mass, calculi or hydronephrosis. No focal bladder wall thickening. Stomach/Bowel: Normal appearance of the stomach. Marked small bowel dilation with abrupt caliber transition in the right lower quadrant (2:44, 5:60). Moderate volume stool within the ascending and transverse colon. The descending colon is decompressed. Appendix is not discretely seen. Vascular/Lymphatic: Aortic atherosclerosis. No enlarged abdominal or pelvic lymph nodes. Reproductive: No adnexal masses. Other: Trace free fluid.  No free air or fluid collection. Musculoskeletal: No acute or abnormal lytic or blastic osseous lesions. IMPRESSION: 1. Marked  small bowel dilation with abrupt caliber transition in the right lower quadrant, consistent with small-bowel obstruction. 2. Moderate volume stool within the ascending and transverse colon. 3. Atrophic left kidney. 4. Aortic Atherosclerosis (ICD10-I70.0). Coronary artery calcifications. Assessment for potential risk factor modification, dietary therapy or pharmacologic therapy may be warranted, if clinically indicated. Electronically Signed   By: Agustin Cree M.D.   On: 10/04/2022 14:00     Assessment & Plan:  Lea Purviance is a 67 y.o. female with SBO on CT scan. Prior CVA on plavix, prior G tube and hysterectomy in 1980s.   NPO, NG Hold plavix pending any surgery needed  Will hold on SBFT for now given plavix  All questions were answered to the satisfaction of the patient.   [name] 10/04/2022, 2:53 PM

## 2022-10-04 NOTE — H&P (Addendum)
Patient Demographics:    Kendra Lee, is a 67 y.o. female  MRN: [id number]   DOB - [birth year]  Admit Date - 10/04/2022  Outpatient Primary MD for the patient is Benetta Spar, MD   Assessment & Plan:   Assessment and Plan:   1) SBO--- patient previously had a gastrostomy tube in 2022 as well as prior hysterectomy in the 1980s -Suspect adhesion related -General surgery consult appreciated -Continue NG tube -As needed antiemetics -As needed pain medications -Dulcolax suppository -General surgeon would like to hold off on SBFT for now -Hold Plavix  2) history of prior stroke--back in 2022 with dysphagia requiring G-tube placement at that time -Hold Plavix as patient will need surgery for #1 above -Okay to continue aspirin and atorvastatin for secondary stroke prophylaxis  3) tobacco abuse--smoking cessation advised patient not ready to quit smoking given nicotine patch  4)HTN--hold losartan due to risk of dehydration and AKI -May use topical clonidine for now -IV hydralazine as needed as needed  5) leukocytosis--WBC 17.1 most likely due to #1 above  -Protonix for GI prophylaxis and SCDs for DVT prophylaxis  Status is: Inpatient  Remains inpatient appropriate because:   Dispo: The patient is from: Home              Anticipated d/c is to: Home              Anticipated d/c date is: 3 days              Patient currently is not medically stable to d/c. Barriers: Not Clinically Stable-   With History of - Reviewed by me  Past Medical History:  Diagnosis Date   CVA (cerebral vascular accident) (HCC)    Dysphagia    Hypertension    Hypokalemia    Smoker    2 ppd x 55 years      Past Surgical History:  Procedure Laterality Date   ABDOMINAL HYSTERECTOMY     late 1980s   IR  GASTROSTOMY TUBE MOD SED  09/22/2020   IR GASTROSTOMY TUBE MOD SED  10/04/2020   Chief Complaint  Patient presents with   Abdominal Pain      HPI:    Kendra Lee  is a 67 y.o. female with past medical history relevant for prior stroke in June 2022,, status post prior gastrostomy tube placed in June  2022 subsequently removed in October 2022, tobacco abuse and HTN who presents to the ED with abdominal pain nausea and vomiting -Had normal BM on 10/03/2022 -Episodes of vomiting have been persistent with more than a dozen today already, -Emesis is without blood or bile No fever  Or chills  -CT abdomen pelvis showed--IMPRESSION: 1. Marked small bowel dilation with abrupt caliber transition in the right lower quadrant, consistent with small-bowel obstruction. 2. Moderate volume stool within the ascending and transverse colon. 3. Atrophic left kidney. - NG tube placed in the ED, follow-up  x-ray after NG tube placement shows NG tube in the stomach and no acute cardiopulmonary findings -Lipase is 51 -Sodium is 136 potassium is 3.9 chloride 87 bicarb 34 glucose 138, creatinine is 1.0, --LFTs are not elevated -WBC 17.1 -Hemoglobin 16.8, platelets 335   Review of systems:    In addition to the HPI above,   A full Review of  Systems was done, all other systems reviewed are negative except as noted above in HPI , .    Social History:  Reviewed by me    Social History   Tobacco Use   Smoking status: Every Day    Current packs/day: 1.00    Types: Cigarettes   Smokeless tobacco: Never  Substance Use Topics   Alcohol use: Never     Family History :  Reviewed by me   History reviewed. No pertinent family history.   Home Medications:   Prior to Admission medications   Medication Sig Start Date End Date Taking? Authorizing Provider  amLODipine (NORVASC) 5 MG tablet Place 1 tablet (5 mg total) into feeding tube daily. 09/24/20   Catarina Hartshorn, MD  aspirin 81 MG chewable tablet  Place 1 tablet (81 mg total) into feeding tube daily. 09/24/20   Catarina Hartshorn, MD  atorvastatin (LIPITOR) 80 MG tablet Place 1 tablet (80 mg total) into feeding tube daily. 09/24/20   Catarina Hartshorn, MD  clopidogrel (PLAVIX) 75 MG tablet Place 1 tablet (75 mg total) into feeding tube daily. 09/24/20   Catarina Hartshorn, MD  hydrALAZINE (APRESOLINE) 10 MG tablet Take 1 tablet (10 mg total) by mouth every 8 (eight) hours. 09/24/20   Catarina Hartshorn, MD  losartan (COZAAR) 25 MG tablet Place 1 tablet (25 mg total) into feeding tube daily. 09/24/20   Catarina Hartshorn, MD  Nutritional Supplements (FEEDING SUPPLEMENT, OSMOLITE 1.2 CAL,) LIQD Infuse at 55 cc/hour via pump 09/24/20   Tat, David, MD  thiamine 100 MG tablet Place 1 tablet (100 mg total) into feeding tube daily. 09/24/20   Catarina Hartshorn, MD  Water For Irrigation, Sterile (FREE WATER) SOLN Place 200 mLs into feeding tube every 8 (eight) hours. 09/24/20   Catarina Hartshorn, MD     Allergies:    No Known Allergies   Physical Exam:   Vitals  Blood pressure (!) 178/94, pulse 80, temperature 98.1 F (36.7 C), temperature source Oral, resp. rate 16, height 5\' 1"  (1.549 m), weight 44 kg, SpO2 92%.  Physical Examination: General appearance - alert,  in no distress , frail and cachectic appearing Mental status - alert, oriented to person, place, and time,  Eyes - sclera anicteric Neck - supple, no JVD elevation , Chest - clear  to auscultation bilaterally, symmetrical air movement,  Heart - S1 and S2 normal, regular  Abdomen - soft,,uncomfortable with palpation, bowel sounds diminished, healed prior gastrostomy scar Neurological - screening mental status exam normal, neck supple without rigidity, cranial nerves II through XII intact, DTR's normal and symmetric Extremities - no pedal edema noted, intact peripheral pulses  Skin - warm, dry   Data Review:    CBC Recent Labs  Lab 10/04/22 1254 10/04/22 1301  WBC 17.1*  --   HGB 16.8* 17.7*  HCT 48.4* 52.0*  PLT 335  --   MCV 92.7   --   MCH 32.2  --   MCHC 34.7  --   RDW 13.2  --   LYMPHSABS 1.1  --   MONOABS 0.8  --   EOSABS 0.0  --  BASOSABS 0.0  --    ------------------------------------------------------------------------------------------------------------------  Chemistries  Recent Labs  Lab 10/04/22 1254 10/04/22 1301  NA 136 136  K 3.9 3.9  CL 87* 90*  CO2 34*  --   GLUCOSE 138* 137*  BUN 22 23  CREATININE 1.01* 1.00  CALCIUM 11.1*  --   AST 29  --   ALT 27  --   ALKPHOS 49  --   BILITOT 0.6  --    ------------------------------------------------------------------------------------------------------------------ estimated creatinine clearance is 37.4 mL/min (by C-G formula based on SCr of 1 mg/dL). ------------------------------------------------------------------------------------------------------------------ -----------------------------------------------------------------------------  Urinalysis    Component Value Date/Time   COLORURINE YELLOW 10/05/2020 1200   APPEARANCEUR HAZY (A) 10/05/2020 1200   LABSPEC 1.017 10/05/2020 1200   PHURINE 5.0 10/05/2020 1200   GLUCOSEU NEGATIVE 10/05/2020 1200   HGBUR MODERATE (A) 10/05/2020 1200   BILIRUBINUR NEGATIVE 10/05/2020 1200   KETONESUR NEGATIVE 10/05/2020 1200   PROTEINUR NEGATIVE 10/05/2020 1200   NITRITE NEGATIVE 10/05/2020 1200   LEUKOCYTESUR LARGE (A) 10/05/2020 1200    ----------------------------------------------------------------------------------------------------------------   Imaging Results:    DG Chest Portable 1 View  Result Date: 10/04/2022 CLINICAL DATA:  NG tube placement. Abdominal pain with nausea and vomiting since last night. EXAM: PORTABLE CHEST 1 VIEW COMPARISON:  10/08/2020 FINDINGS: Heart size and pulmonary vascularity are normal for technique. Mild scarring and calcified granulomas in the upper lungs, likely postinflammatory. No airspace disease or consolidation in the lungs. No pleural effusions. No  pneumothorax. Mediastinal contours appear intact. An enteric tube has been placed with tip projecting over the left upper quadrant consistent with location in the body of the stomach. Residual contrast material in the renal collecting systems. IMPRESSION: 1. No evidence of active pulmonary disease. 2. Enteric tube tip projects over the left upper quadrant consistent with location in the body of the stomach. Electronically Signed   By: Burman Nieves M.D.   On: 10/04/2022 15:31   CT ABDOMEN PELVIS W CONTRAST  Result Date: 10/04/2022 CLINICAL DATA:  One day history of periumbilical abdominal pain associated with nausea and vomiting EXAM: CT ABDOMEN AND PELVIS WITH CONTRAST TECHNIQUE: Multidetector CT imaging of the abdomen and pelvis was performed using the standard protocol following bolus administration of intravenous contrast. RADIATION DOSE REDUCTION: This exam was performed according to the departmental dose-optimization program which includes automated exposure control, adjustment of the mA and/or kV according to patient size and/or use of iterative reconstruction technique. CONTRAST:  60mL OMNIPAQUE IOHEXOL 300 MG/ML  SOLN COMPARISON:  CT abdomen and pelvis dated 09/21/2020 FINDINGS: Lower chest: No focal consolidation or pulmonary nodule in the lung bases. No pleural effusion or pneumothorax demonstrated. Partially imaged heart size is normal. Coronary artery calcifications. Hepatobiliary: No focal hepatic lesions. No intra or extrahepatic biliary ductal dilation. Normal gallbladder. Pancreas: No focal lesions or main ductal dilation. Spleen: Normal in size without focal abnormality. Adrenals/Urinary Tract: No adrenal nodules. Atrophic left kidney. No suspicious renal mass, calculi or hydronephrosis. No focal bladder wall thickening. Stomach/Bowel: Normal appearance of the stomach. Marked small bowel dilation with abrupt caliber transition in the right lower quadrant (2:44, 5:60). Moderate volume stool  within the ascending and transverse colon. The descending colon is decompressed. Appendix is not discretely seen. Vascular/Lymphatic: Aortic atherosclerosis. No enlarged abdominal or pelvic lymph nodes. Reproductive: No adnexal masses. Other: Trace free fluid.  No free air or fluid collection. Musculoskeletal: No acute or abnormal lytic or blastic osseous lesions. IMPRESSION: 1. Marked small bowel dilation with abrupt caliber transition in the  right lower quadrant, consistent with small-bowel obstruction. 2. Moderate volume stool within the ascending and transverse colon. 3. Atrophic left kidney. 4. Aortic Atherosclerosis (ICD10-I70.0). Coronary artery calcifications. Assessment for potential risk factor modification, dietary therapy or pharmacologic therapy may be warranted, if clinically indicated. Electronically Signed   By: Agustin Cree M.D.   On: 10/04/2022 14:00    Radiological Exams on Admission: DG Chest Portable 1 View  Result Date: 10/04/2022 CLINICAL DATA:  NG tube placement. Abdominal pain with nausea and vomiting since last night. EXAM: PORTABLE CHEST 1 VIEW COMPARISON:  10/08/2020 FINDINGS: Heart size and pulmonary vascularity are normal for technique. Mild scarring and calcified granulomas in the upper lungs, likely postinflammatory. No airspace disease or consolidation in the lungs. No pleural effusions. No pneumothorax. Mediastinal contours appear intact. An enteric tube has been placed with tip projecting over the left upper quadrant consistent with location in the body of the stomach. Residual contrast material in the renal collecting systems. IMPRESSION: 1. No evidence of active pulmonary disease. 2. Enteric tube tip projects over the left upper quadrant consistent with location in the body of the stomach. Electronically Signed   By: Burman Nieves M.D.   On: 10/04/2022 15:31   CT ABDOMEN PELVIS W CONTRAST  Result Date: 10/04/2022 CLINICAL DATA:  One day history of periumbilical abdominal  pain associated with nausea and vomiting EXAM: CT ABDOMEN AND PELVIS WITH CONTRAST TECHNIQUE: Multidetector CT imaging of the abdomen and pelvis was performed using the standard protocol following bolus administration of intravenous contrast. RADIATION DOSE REDUCTION: This exam was performed according to the departmental dose-optimization program which includes automated exposure control, adjustment of the mA and/or kV according to patient size and/or use of iterative reconstruction technique. CONTRAST:  60mL OMNIPAQUE IOHEXOL 300 MG/ML  SOLN COMPARISON:  CT abdomen and pelvis dated 09/21/2020 FINDINGS: Lower chest: No focal consolidation or pulmonary nodule in the lung bases. No pleural effusion or pneumothorax demonstrated. Partially imaged heart size is normal. Coronary artery calcifications. Hepatobiliary: No focal hepatic lesions. No intra or extrahepatic biliary ductal dilation. Normal gallbladder. Pancreas: No focal lesions or main ductal dilation. Spleen: Normal in size without focal abnormality. Adrenals/Urinary Tract: No adrenal nodules. Atrophic left kidney. No suspicious renal mass, calculi or hydronephrosis. No focal bladder wall thickening. Stomach/Bowel: Normal appearance of the stomach. Marked small bowel dilation with abrupt caliber transition in the right lower quadrant (2:44, 5:60). Moderate volume stool within the ascending and transverse colon. The descending colon is decompressed. Appendix is not discretely seen. Vascular/Lymphatic: Aortic atherosclerosis. No enlarged abdominal or pelvic lymph nodes. Reproductive: No adnexal masses. Other: Trace free fluid.  No free air or fluid collection. Musculoskeletal: No acute or abnormal lytic or blastic osseous lesions. IMPRESSION: 1. Marked small bowel dilation with abrupt caliber transition in the right lower quadrant, consistent with small-bowel obstruction. 2. Moderate volume stool within the ascending and transverse colon. 3. Atrophic left kidney.  4. Aortic Atherosclerosis (ICD10-I70.0). Coronary artery calcifications. Assessment for potential risk factor modification, dietary therapy or pharmacologic therapy may be warranted, if clinically indicated. Electronically Signed   By: Agustin Cree M.D.   On: 10/04/2022 14:00    DVT Prophylaxis -SCD -- AM Labs Ordered, also please review Full Orders  Family Communication: Admission, patients condition and plan of care including tests being ordered have been discussed with the patient who indicate understanding and agree with the plan   Condition -stable  Shon Hale M.D on 10/04/2022 at 6:31 PM Go to [email or web address] -  for  Lexicographer  [facility] - Office  [phone]

## 2022-10-05 DIAGNOSIS — K56609 Unspecified intestinal obstruction, unspecified as to partial versus complete obstruction: Secondary | ICD-10-CM | POA: Diagnosis not present

## 2022-10-05 LAB — CBC
HCT: 41.2 % (ref 36.0–46.0)
Hemoglobin: 14 g/dL (ref 12.0–15.0)
MCH: 32.1 pg (ref 26.0–34.0)
MCHC: 34 g/dL (ref 30.0–36.0)
MCV: 94.5 fL (ref 80.0–100.0)
Platelets: 269 10*3/uL (ref 150–400)
RBC: 4.36 MIL/uL (ref 3.87–5.11)
RDW: 13.3 % (ref 11.5–15.5)
WBC: 11.9 10*3/uL — ABNORMAL HIGH (ref 4.0–10.5)
nRBC: 0 % (ref 0.0–0.2)

## 2022-10-05 LAB — BASIC METABOLIC PANEL
Anion gap: 10 (ref 5–15)
BUN: 17 mg/dL (ref 8–23)
CO2: 30 mmol/L (ref 22–32)
Calcium: 9.3 mg/dL (ref 8.9–10.3)
Chloride: 98 mmol/L (ref 98–111)
Creatinine, Ser: 0.67 mg/dL (ref 0.44–1.00)
GFR, Estimated: >60 mL/min (ref >60)
Glucose, Bld: 163 mg/dL — ABNORMAL HIGH (ref 70–99)
Potassium: 2.8 mmol/L — ABNORMAL LOW (ref 3.5–5.1)
Sodium: 138 mmol/L (ref 135–145)

## 2022-10-05 LAB — HIV ANTIBODY (ROUTINE TESTING W REFLEX): HIV Screen 4th Generation wRfx: NONREACTIVE

## 2022-10-05 MED ORDER — POTASSIUM CHLORIDE 10 MEQ/100ML IV SOLN
10.0000 meq | INTRAVENOUS | Status: AC
  Administered 2022-10-05 (×4): 10 meq via INTRAVENOUS
  Filled 2022-10-05 (×4): qty 100

## 2022-10-05 MED ORDER — BISACODYL 10 MG RE SUPP
10.0000 mg | Freq: Two times a day (BID) | RECTAL | Status: DC
  Administered 2022-10-05 – 2022-10-07 (×5): 10 mg via RECTAL
  Filled 2022-10-05 (×7): qty 1

## 2022-10-05 NOTE — Progress Notes (Signed)
[facility] Progress Note     Subjective: Says less painful and bloating. Ng output minimal.   Objective: Vital signs in last 24 hours: Temp:  [97.8 F (36.6 C)-98.2 F (36.8 C)] 97.8 F (36.6 C) (07/13 0511) Pulse Rate:  [77-101] 82 (07/13 0511) Resp:  [15-20] 17 (07/13 0511) BP: (161-197)/(76-110) 186/81 (07/13 0511) SpO2:  [91 %-93 %] 92 % (07/13 0511) Weight:  [44 kg] 44 kg (07/12 1201)    Intake/Output from previous day: 07/12 0701 - 07/13 0700 In: 0  Out: 100 [Urine:100] Intake/Output this shift: No intake/output data recorded.  General appearance: alert and no distress GI: soft, nondistended, less tender lower abdomen  Lab Results:  Recent Labs    10/04/22 1254 10/04/22 1301 10/05/22 0421  WBC 17.1*  --  11.9*  HGB 16.8* 17.7* 14.0  HCT 48.4* 52.0* 41.2  PLT 335  --  269   BMET Recent Labs    10/04/22 1254 10/04/22 1301 10/05/22 0421  NA 136 136 138  K 3.9 3.9 2.8*  CL 87* 90* 98  CO2 34*  --  30  GLUCOSE 138* 137* 163*  BUN 22 23 17   CREATININE 1.01* 1.00 0.67  CALCIUM 11.1*  --  9.3   PT/INR No results for input(s): "LABPROT", "INR" in the last 72 hours.  Studies/Results: DG Chest Portable 1 View  Result Date: 10/04/2022 CLINICAL DATA:  NG tube placement. Abdominal pain with nausea and vomiting since last night. EXAM: PORTABLE CHEST 1 VIEW COMPARISON:  10/08/2020 FINDINGS: Heart size and pulmonary vascularity are normal for technique. Mild scarring and calcified granulomas in the upper lungs, likely postinflammatory. No airspace disease or consolidation in the lungs. No pleural effusions. No pneumothorax. Mediastinal contours appear intact. An enteric tube has been placed with tip projecting over the left upper quadrant consistent with location in the body of the stomach. Residual contrast material in the renal collecting systems. IMPRESSION: 1. No evidence of active pulmonary disease. 2. Enteric tube tip projects over the left  upper quadrant consistent with location in the body of the stomach. Electronically Signed   By: Burman Nieves M.D.   On: 10/04/2022 15:31   CT ABDOMEN PELVIS W CONTRAST  Result Date: 10/04/2022 CLINICAL DATA:  One day history of periumbilical abdominal pain associated with nausea and vomiting EXAM: CT ABDOMEN AND PELVIS WITH CONTRAST TECHNIQUE: Multidetector CT imaging of the abdomen and pelvis was performed using the standard protocol following bolus administration of intravenous contrast. RADIATION DOSE REDUCTION: This exam was performed according to the departmental dose-optimization program which includes automated exposure control, adjustment of the mA and/or kV according to patient size and/or use of iterative reconstruction technique. CONTRAST:  60mL OMNIPAQUE IOHEXOL 300 MG/ML  SOLN COMPARISON:  CT abdomen and pelvis dated 09/21/2020 FINDINGS: Lower chest: No focal consolidation or pulmonary nodule in the lung bases. No pleural effusion or pneumothorax demonstrated. Partially imaged heart size is normal. Coronary artery calcifications. Hepatobiliary: No focal hepatic lesions. No intra or extrahepatic biliary ductal dilation. Normal gallbladder. Pancreas: No focal lesions or main ductal dilation. Spleen: Normal in size without focal abnormality. Adrenals/Urinary Tract: No adrenal nodules. Atrophic left kidney. No suspicious renal mass, calculi or hydronephrosis. No focal bladder wall thickening. Stomach/Bowel: Normal appearance of the stomach. Marked small bowel dilation with abrupt caliber transition in the right lower quadrant (2:44, 5:60). Moderate volume stool within the ascending and transverse colon. The descending colon is decompressed. Appendix is not discretely seen. Vascular/Lymphatic: Aortic atherosclerosis. No enlarged abdominal  or pelvic lymph nodes. Reproductive: No adnexal masses. Other: Trace free fluid.  No free air or fluid collection. Musculoskeletal: No acute or abnormal lytic or  blastic osseous lesions. IMPRESSION: 1. Marked small bowel dilation with abrupt caliber transition in the right lower quadrant, consistent with small-bowel obstruction. 2. Moderate volume stool within the ascending and transverse colon. 3. Atrophic left kidney. 4. Aortic Atherosclerosis (ICD10-I70.0). Coronary artery calcifications. Assessment for potential risk factor modification, dietary therapy or pharmacologic therapy may be warranted, if clinically indicated. Electronically Signed   By: Agustin Cree M.D.   On: 10/04/2022 14:00    Anti-infectives: Anti-infectives (From admission, onward)    None       Assessment/Plan: Patient with SBO. Improving. Holding on SBFT given plavix. NG with minimal output. Dulcolax BID now to clean out colon Leave NG KUB in AM     LOS: 1 day    [name] 10/05/2022

## 2022-10-05 NOTE — Progress Notes (Signed)
PROGRESS NOTE     Kendra Lee, is a 67 y.o. female, DOB - 1955/12/04, ZOX:[id number]  Admit date - 10/04/2022   Admitting Physician Shon Hale, MD  Outpatient Primary MD for the patient is Benetta Spar, MD  LOS - 1  Chief Complaint  Patient presents with   Abdominal Pain        Brief Narrative:  67 y.o. female with past medical history relevant for prior stroke in June 2022,, status post prior gastrostomy tube placed in June  2022 subsequently removed in October 2022, tobacco abuse and HTN admitted on 10/04/2022 with SBO    -Assessment and Plan: 1)SBO--- patient previously had a gastrostomy tube in 2022 as well as prior hysterectomy in the 1980s -Suspect adhesion related -General surgery consult appreciated -Continue NG tube -As needed antiemetics -As needed pain medications -Dulcolax suppository as ordered -General surgeon would like to hold off on SBFT for now -Hold Plavix   2)History of prior stroke--back in 2022 with dysphagia requiring G-tube placement at that time -Hold Plavix as patient may need surgery for #1 above -Okay to continue aspirin and atorvastatin for secondary stroke prophylaxis   3)Tobacco abuse--smoking cessation advised patient not ready to quit smoking given nicotine patch   4)HTN--hold losartan due to risk of dehydration and AKI -May use topical clonidine for now -IV hydralazine as needed as needed   5)Leukocytosis--most likely due to #1 above WBC 17.1 >>>11.9   -Protonix for GI prophylaxis and SCDs for DVT prophylaxis  Status is: Inpatient   Disposition: The patient is from: Home              Anticipated d/c is to: Home              Anticipated d/c date is: > 3 days              Patient currently is not medically stable to d/c. Barriers: Not Clinically Stable-   Code Status :  -  Code Status: Full Code   Family Communication:    NA (patient is alert, awake and coherent)   DVT Prophylaxis  :   - SCDs  heparin  injection 5,000 Units Start: 10/04/22 2200 SCDs Start: 10/04/22 1444 Place TED hose Start: 10/04/22 1444   Lab Results  Component Value Date   PLT 269 10/05/2022    Inpatient Medications  Scheduled Meds:  bisacodyl  10 mg Rectal Once   bisacodyl  10 mg Rectal BID   cloNIDine  0.1 mg Transdermal Weekly   heparin  5,000 Units Subcutaneous Q8H   morphine (PF)  2 mg Intravenous Once   nicotine  21 mg Transdermal Daily   pantoprazole (PROTONIX) IV  40 mg Intravenous Q24H   sodium chloride flush  3 mL Intravenous Q12H   sodium chloride flush  3 mL Intravenous Q12H   Continuous Infusions:  sodium chloride     dextrose 5 % and 0.9 % NaCl 125 mL/hr at 10/04/22 1644   sodium chloride     PRN Meds:.sodium chloride, acetaminophen **OR** acetaminophen, fentaNYL (SUBLIMAZE) injection, hydrALAZINE, ondansetron **OR** ondansetron (ZOFRAN) IV, polyethylene glycol, sodium chloride flush, traZODone   Anti-infectives (From admission, onward)    None      Subjective: Marvia Pickles Minteer today has no fevers, no further emesis,  No chest pain,  - Had BM after Dulcolax suppository  Objective: Vitals:   10/04/22 2136 10/05/22 0150 10/05/22 0336 10/05/22 0511  BP: (!) 161/89 (!) 184/76 (!) 179/77 (!) 186/81  Pulse: 88 79 86 82  Resp: 18 18  17   Temp: 98 F (36.7 C) 98.2 F (36.8 C)  97.8 F (36.6 C)  TempSrc: Oral     SpO2: 91% 92%  92%  Weight:      Height:        Intake/Output Summary (Last 24 hours) at 10/05/2022 1502 Last data filed at 10/05/2022 1155 Gross per 24 hour  Intake 0 ml  Output 400 ml  Net -400 ml   Filed Weights   10/04/22 1201  Weight: 44 kg   Physical Exam Gen:- Awake Alert, in no apparent distress  HEENT:- [place].AT, No sclera icterus Nose- NG tube with gastric contents Neck-Supple Neck,No JVD,.  Lungs-  CTAB , fair symmetrical air movement CV- S1, S2 normal, regular  Abd-  - soft,,uncomfortable with palpation, bowel sounds diminished, healed prior  gastrostomy scar   Extremity/Skin:- No  edema, pedal pulses present  Psych-affect is appropriate, oriented x3 Neuro-no new focal deficits, no tremors  Data Reviewed: I have personally reviewed following labs and imaging studies  CBC: Recent Labs  Lab 10/04/22 1254 10/04/22 1301 10/05/22 0421  WBC 17.1*  --  11.9*  NEUTROABS 15.0*  --   --   HGB 16.8* 17.7* 14.0  HCT 48.4* 52.0* 41.2  MCV 92.7  --  94.5  PLT 335  --  269   Basic Metabolic Panel: Recent Labs  Lab 10/04/22 1254 10/04/22 1301 10/05/22 0421  NA 136 136 138  K 3.9 3.9 2.8*  CL 87* 90* 98  CO2 34*  --  30  GLUCOSE 138* 137* 163*  BUN 22 23 17   CREATININE 1.01* 1.00 0.67  CALCIUM 11.1*  --  9.3   GFR: Estimated Creatinine Clearance: 46.8 mL/min (by C-G formula based on SCr of 0.67 mg/dL). Liver Function Tests: Recent Labs  Lab 10/04/22 1254  AST 29  ALT 27  ALKPHOS 49  BILITOT 0.6  PROT 8.5*  ALBUMIN 5.0   Radiology Studies: DG Chest Portable 1 View  Result Date: 10/04/2022 CLINICAL DATA:  NG tube placement. Abdominal pain with nausea and vomiting since last night. EXAM: PORTABLE CHEST 1 VIEW COMPARISON:  10/08/2020 FINDINGS: Heart size and pulmonary vascularity are normal for technique. Mild scarring and calcified granulomas in the upper lungs, likely postinflammatory. No airspace disease or consolidation in the lungs. No pleural effusions. No pneumothorax. Mediastinal contours appear intact. An enteric tube has been placed with tip projecting over the left upper quadrant consistent with location in the body of the stomach. Residual contrast material in the renal collecting systems. IMPRESSION: 1. No evidence of active pulmonary disease. 2. Enteric tube tip projects over the left upper quadrant consistent with location in the body of the stomach. Electronically Signed   By: Burman Nieves M.D.   On: 10/04/2022 15:31   CT ABDOMEN PELVIS W CONTRAST  Result Date: 10/04/2022 CLINICAL DATA:  One day  history of periumbilical abdominal pain associated with nausea and vomiting EXAM: CT ABDOMEN AND PELVIS WITH CONTRAST TECHNIQUE: Multidetector CT imaging of the abdomen and pelvis was performed using the standard protocol following bolus administration of intravenous contrast. RADIATION DOSE REDUCTION: This exam was performed according to the departmental dose-optimization program which includes automated exposure control, adjustment of the mA and/or kV according to patient size and/or use of iterative reconstruction technique. CONTRAST:  60mL OMNIPAQUE IOHEXOL 300 MG/ML  SOLN COMPARISON:  CT abdomen and pelvis dated 09/21/2020 FINDINGS: Lower chest: No focal consolidation or pulmonary nodule in  the lung bases. No pleural effusion or pneumothorax demonstrated. Partially imaged heart size is normal. Coronary artery calcifications. Hepatobiliary: No focal hepatic lesions. No intra or extrahepatic biliary ductal dilation. Normal gallbladder. Pancreas: No focal lesions or main ductal dilation. Spleen: Normal in size without focal abnormality. Adrenals/Urinary Tract: No adrenal nodules. Atrophic left kidney. No suspicious renal mass, calculi or hydronephrosis. No focal bladder wall thickening. Stomach/Bowel: Normal appearance of the stomach. Marked small bowel dilation with abrupt caliber transition in the right lower quadrant (2:44, 5:60). Moderate volume stool within the ascending and transverse colon. The descending colon is decompressed. Appendix is not discretely seen. Vascular/Lymphatic: Aortic atherosclerosis. No enlarged abdominal or pelvic lymph nodes. Reproductive: No adnexal masses. Other: Trace free fluid.  No free air or fluid collection. Musculoskeletal: No acute or abnormal lytic or blastic osseous lesions. IMPRESSION: 1. Marked small bowel dilation with abrupt caliber transition in the right lower quadrant, consistent with small-bowel obstruction. 2. Moderate volume stool within the ascending and  transverse colon. 3. Atrophic left kidney. 4. Aortic Atherosclerosis (ICD10-I70.0). Coronary artery calcifications. Assessment for potential risk factor modification, dietary therapy or pharmacologic therapy may be warranted, if clinically indicated. Electronically Signed   By: Agustin Cree M.D.   On: 10/04/2022 14:00    Scheduled Meds:  bisacodyl  10 mg Rectal Once   bisacodyl  10 mg Rectal BID   cloNIDine  0.1 mg Transdermal Weekly   heparin  5,000 Units Subcutaneous Q8H   morphine (PF)  2 mg Intravenous Once   nicotine  21 mg Transdermal Daily   pantoprazole (PROTONIX) IV  40 mg Intravenous Q24H   sodium chloride flush  3 mL Intravenous Q12H   sodium chloride flush  3 mL Intravenous Q12H   Continuous Infusions:  sodium chloride     dextrose 5 % and 0.9 % NaCl 125 mL/hr at 10/04/22 1644   sodium chloride      LOS: 1 day   Shon Hale M.D on 10/05/2022 at 3:02 PM  Go to [email or web address] - for contact info  [facility] - Office  [phone]  If 7PM-7AM, please contact night-coverage [email or web address] 10/05/2022, 3:02 PM

## 2022-10-05 NOTE — TOC Initial Note (Signed)
Transition of Care [facility]) - Initial/Assessment Note    Patient Details  Name: Kendra Lee MRN: [id number] Date of Birth: 12-07-1955  [facility] of [facility]) CM/SW Contact:    Catalina Gravel, LCSW Phone Number: 10/05/2022, 11:22 AM  Clinical Narrative:                 Pt from home. GI concern led to ER Visit. Surgical consult requested due to obstruction. No identified TOC needs at this time,  TOC to continue to follow.     Barriers to Discharge: Continued Medical Work up   Patient Goals and CMS Choice            Expected Discharge Plan and Services                                              Prior Living Arrangements/Services                       Activities of Daily Living Home Assistive Devices/Equipment: None ADL Screening (condition at time of admission) Is the patient deaf or have difficulty hearing?: No Does the patient have difficulty seeing, even when wearing glasses/contacts?: No Does the patient have difficulty concentrating, remembering, or making decisions?: No Does the patient have difficulty dressing or bathing?: No Does the patient have difficulty walking or climbing stairs?: No  Permission Sought/Granted                  Emotional Assessment              Admission diagnosis:  SBO (small bowel obstruction) (HCC) [K56.609] Patient Active Problem List   Diagnosis Date Noted   SBO (small bowel obstruction) (HCC) 10/04/2022   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) 10/04/2022   Vitamin D deficiency 10/02/2020   Dental caries 10/02/2020   Dysphagia 10/02/2020   Hypokalemia 10/02/2020   Acute metabolic encephalopathy 09/18/2020   CVA (cerebral vascular accident) (HCC) 09/18/2020   Leukocytosis 09/18/2020   Essential hypertension 09/18/2020   PCP:  Benetta Spar, MD Pharmacy:   [id number] - [place], [place] - [address] [place] [address] Phone: [phone] Fax:  [phone]  [id number] - [place], [place] - [address] [place] [address] Phone: [phone] Fax: [phone]     Social Determinants of Health (SDOH) Social History: SDOH Screenings   Food Insecurity: No Food Insecurity (10/04/2022)  Housing: Low Risk  (10/04/2022)  Transportation Needs: No Transportation Needs (10/04/2022)  Utilities: Not At Risk (10/04/2022)  Tobacco Use: High Risk (10/04/2022)   SDOH Interventions:     Readmission Risk Interventions    10/09/2020   11:28 AM 09/20/2020    2:01 PM  Readmission Risk Prevention Plan  Medication Screening  Complete  Transportation Screening Complete Complete  PCP or Specialist Appt within 5-7 Days Complete   Home Care Screening Complete   Medication Review (RN CM) Complete

## 2022-10-06 ENCOUNTER — Inpatient Hospital Stay (HOSPITAL_COMMUNITY): Payer: 59

## 2022-10-06 DIAGNOSIS — K56609 Unspecified intestinal obstruction, unspecified as to partial versus complete obstruction: Secondary | ICD-10-CM | POA: Diagnosis not present

## 2022-10-06 LAB — RENAL FUNCTION PANEL
Albumin: 3.4 g/dL — ABNORMAL LOW (ref 3.5–5.0)
Anion gap: 8 (ref 5–15)
BUN: 8 mg/dL (ref 8–23)
CO2: 27 mmol/L (ref 22–32)
Calcium: 8.5 mg/dL — ABNORMAL LOW (ref 8.9–10.3)
Chloride: 103 mmol/L (ref 98–111)
Creatinine, Ser: 0.53 mg/dL (ref 0.44–1.00)
GFR, Estimated: >60 mL/min (ref >60)
Glucose, Bld: 121 mg/dL — ABNORMAL HIGH (ref 70–99)
Phosphorus: 1.6 mg/dL — ABNORMAL LOW (ref 2.5–4.6)
Potassium: 2.4 mmol/L — CL (ref 3.5–5.1)
Sodium: 138 mmol/L (ref 135–145)

## 2022-10-06 LAB — MAGNESIUM: Magnesium: 1.9 mg/dL (ref 1.7–2.4)

## 2022-10-06 MED ORDER — AMLODIPINE BESYLATE 5 MG PO TABS
5.0000 mg | ORAL_TABLET | Freq: Every day | ORAL | Status: DC
  Administered 2022-10-06 – 2022-10-08 (×3): 5 mg via ORAL
  Filled 2022-10-06 (×3): qty 1

## 2022-10-06 MED ORDER — POTASSIUM CHLORIDE 10 MEQ/100ML IV SOLN
10.0000 meq | INTRAVENOUS | Status: AC
  Administered 2022-10-06 (×4): 10 meq via INTRAVENOUS
  Filled 2022-10-06 (×4): qty 100

## 2022-10-06 MED ORDER — POTASSIUM PHOSPHATES 15 MMOLE/5ML IV SOLN
30.0000 mmol | Freq: Once | INTRAVENOUS | Status: AC
  Administered 2022-10-06: 30 mmol via INTRAVENOUS
  Filled 2022-10-06: qty 10

## 2022-10-06 NOTE — Progress Notes (Signed)
PROGRESS NOTE     Kendra Lee, is a 67 y.o. female, DOB - [birth year], ZOX:[id number]  Admit date - 10/04/2022   Admitting Physician Shon Hale, MD  Outpatient Primary MD for the patient is Benetta Spar, MD  LOS - 2  Chief Complaint  Patient presents with   Abdominal Pain      Brief Narrative:  67 y.o. female with past medical history relevant for prior stroke in June 2022,, status post prior gastrostomy tube placed in June  2022 subsequently removed in October 2022, tobacco abuse and HTN admitted on 10/04/2022 with SBO    -Assessment and Plan: 1)SBO--- patient previously had a gastrostomy tube in 2022 as well as prior hysterectomy in the 1980s -Suspect adhesion related -General surgery consult appreciated -Continue NG tube -As needed antiemetics -As needed pain medications -Dulcolax suppository as ordered -Hold Plavix 10/06/22 Had small mucousy BM after Dulcolax suppository -Repeat KUB on 10/06/2022 shows persistent obstruction -Plans for SBFT on 10/07/2022 -If needed possible surgery on 10/08/2022 after allowing for 5 days of Eliquis washout   2)History of prior stroke--back in 2022 with dysphagia requiring G-tube placement at that time -Hold Plavix as patient may need surgery for #1 above -Okay to continue aspirin and atorvastatin for secondary stroke prophylaxis   3)Tobacco abuse--smoking cessation advised patient not ready to quit smoking  --c/n Nicotine patch   4)HTN--hold losartan due to risk of dehydration and AKI -Continue topical clonidine patch -IV hydralazine as needed as needed   5)Leukocytosis--most likely due to #1 above WBC 17.1 >>>11.9   -Protonix for GI prophylaxis and SCDs for DVT prophylaxis  Status is: Inpatient   Disposition: The patient is from: Home              Anticipated d/c is to: Home              Anticipated d/c date is: > 3 days              Patient currently is not medically stable to d/c. Barriers: Not Clinically  Stable-   Code Status :  -  Code Status: Full Code   Family Communication:    NA (patient is alert, awake and coherent)   DVT Prophylaxis  :   - SCDs  heparin injection 5,000 Units Start: 10/04/22 2200 SCDs Start: 10/04/22 1444 Place TED hose Start: 10/04/22 1444   Lab Results  Component Value Date   PLT 269 10/05/2022    Inpatient Medications  Scheduled Meds:  amLODipine  5 mg Oral Daily   bisacodyl  10 mg Rectal Once   bisacodyl  10 mg Rectal BID   cloNIDine  0.1 mg Transdermal Weekly   heparin  5,000 Units Subcutaneous Q8H   morphine (PF)  2 mg Intravenous Once   nicotine  21 mg Transdermal Daily   pantoprazole (PROTONIX) IV  40 mg Intravenous Q24H   sodium chloride flush  3 mL Intravenous Q12H   sodium chloride flush  3 mL Intravenous Q12H   Continuous Infusions:  sodium chloride     dextrose 5 % and 0.9 % NaCl 125 mL/hr at 10/06/22 0816   potassium chloride 10 mEq (10/06/22 1335)   potassium PHOSPHATE IVPB (in mmol)     sodium chloride     PRN Meds:.sodium chloride, acetaminophen **OR** acetaminophen, fentaNYL (SUBLIMAZE) injection, hydrALAZINE, ondansetron **OR** ondansetron (ZOFRAN) IV, polyethylene glycol, sodium chloride flush, traZODone   Anti-infectives (From admission, onward)    None      Subjective:  Kendra Lee today has no fevers, no further emesis,  No chest pain,  - Had small mucousy BM after Dulcolax suppository  Objective: Vitals:   10/05/22 2013 10/06/22 0507 10/06/22 1019 10/06/22 1326  BP: (!) 170/75 (!) 176/75 (!) 161/77 (!) 170/68  Pulse: 73 71  63  Resp: 18 17  17   Temp: 99.3 F (37.4 C) 98 F (36.7 C)  98 F (36.7 C)  TempSrc: Oral     SpO2: 92% 92%  91%  Weight:      Height:        Intake/Output Summary (Last 24 hours) at 10/06/2022 1340 Last data filed at 10/06/2022 0500 Gross per 24 hour  Intake 2378.07 ml  Output 1301 ml  Net 1077.07 ml   Filed Weights   10/04/22 1201  Weight: 44 kg   Physical Exam Gen:-  Awake Alert, in no apparent distress,chronically ill and cachectic appearing HEENT:- .AT, No sclera icterus Nose- NG tube with gastric contents Neck-Supple Neck,No JVD,.  Lungs-  CTAB , fair symmetrical air movement CV- S1, S2 normal, regular  Abd-  - soft,,uncomfortable with palpation, bowel sounds diminished, healed prior gastrostomy scar   Extremity/Skin:- No  edema, pedal pulses present  Psych-affect is appropriate, oriented x3 Neuro-no new focal deficits, no tremors  Data Reviewed: I have personally reviewed following labs and imaging studies  CBC: Recent Labs  Lab 10/04/22 1254 10/04/22 1301 10/05/22 0421  WBC 17.1*  --  11.9*  NEUTROABS 15.0*  --   --   HGB 16.8* 17.7* 14.0  HCT 48.4* 52.0* 41.2  MCV 92.7  --  94.5  PLT 335  --  269   Basic Metabolic Panel: Recent Labs  Lab 10/04/22 1254 10/04/22 1301 10/05/22 0421 10/06/22 0425  NA 136 136 138 138  K 3.9 3.9 2.8* 2.4*  CL 87* 90* 98 103  CO2 34*  --  30 27  GLUCOSE 138* 137* 163* 121*  BUN 22 23 17 8   CREATININE 1.01* 1.00 0.67 0.53  CALCIUM 11.1*  --  9.3 8.5*  MG  --   --   --  1.9  PHOS  --   --   --  1.6*   GFR: Estimated Creatinine Clearance: 46.8 mL/min (by C-G formula based on SCr of 0.53 mg/dL). Liver Function Tests: Recent Labs  Lab 10/04/22 1254 10/06/22 0425  AST 29  --   ALT 27  --   ALKPHOS 49  --   BILITOT 0.6  --   PROT 8.5*  --   ALBUMIN 5.0 3.4*   Radiology Studies: DG Abd 1 View  Result Date: 10/06/2022 CLINICAL DATA:  [id number] SBO (small bowel obstruction) (HCC) [id number] EXAM: ABDOMEN - 1 VIEW COMPARISON:  10/04/2022 FINDINGS: Interval placement of enteric tube with distal tip projecting in the gastric fundus. Persistent distension of small bowel loops throughout the abdomen. Moderate volume of stool throughout the colon. No gross free intraperitoneal air. IMPRESSION: 1. Interval placement of enteric tube with distal tip projecting in the gastric fundus. 2. Persistent distension of  small bowel loops throughout the abdomen. Electronically Signed   By: Duanne Guess D.O.   On: 10/06/2022 09:12   DG Chest Portable 1 View  Result Date: 10/04/2022 CLINICAL DATA:  NG tube placement. Abdominal pain with nausea and vomiting since last night. EXAM: PORTABLE CHEST 1 VIEW COMPARISON:  10/08/2020 FINDINGS: Heart size and pulmonary vascularity are normal for technique. Mild scarring and calcified granulomas in the upper lungs, likely postinflammatory.  No airspace disease or consolidation in the lungs. No pleural effusions. No pneumothorax. Mediastinal contours appear intact. An enteric tube has been placed with tip projecting over the left upper quadrant consistent with location in the body of the stomach. Residual contrast material in the renal collecting systems. IMPRESSION: 1. No evidence of active pulmonary disease. 2. Enteric tube tip projects over the left upper quadrant consistent with location in the body of the stomach. Electronically Signed   By: Burman Nieves M.D.   On: 10/04/2022 15:31    Scheduled Meds:  amLODipine  5 mg Oral Daily   bisacodyl  10 mg Rectal Once   bisacodyl  10 mg Rectal BID   cloNIDine  0.1 mg Transdermal Weekly   heparin  5,000 Units Subcutaneous Q8H   morphine (PF)  2 mg Intravenous Once   nicotine  21 mg Transdermal Daily   pantoprazole (PROTONIX) IV  40 mg Intravenous Q24H   sodium chloride flush  3 mL Intravenous Q12H   sodium chloride flush  3 mL Intravenous Q12H   Continuous Infusions:  sodium chloride     dextrose 5 % and 0.9 % NaCl 125 mL/hr at 10/06/22 0816   potassium chloride 10 mEq (10/06/22 1335)   potassium PHOSPHATE IVPB (in mmol)     sodium chloride      LOS: 2 days   Shon Hale M.D on 10/06/2022 at 1:40 PM  Go to [email or web address] - for contact info  [facility] - Office  [phone]  If 7PM-7AM, please contact night-coverage [email or web address] 10/06/2022, 1:40 PM

## 2022-10-06 NOTE — Progress Notes (Signed)
[facility] Progress Note     Subjective: KUB this AM with continued obstruction. BM after suppository but this was stool from colon not resolving SBO. She remains distended. She took her plavix 7/11 and will be off 5 days 7/16. KUB reviewed this AM.   Objective: Vital signs in last 24 hours: Temp:  [98 F (36.7 C)-99.3 F (37.4 C)] 98 F (36.7 C) (07/14 1326) Pulse Rate:  [63-73] 63 (07/14 1326) Resp:  [17-18] 17 (07/14 1326) BP: (161-180)/(68-77) 170/68 (07/14 1326) SpO2:  [91 %-93 %] 91 % (07/14 1326) Last BM Date : 10/06/22  Intake/Output from previous day: 07/13 0701 - 07/14 0700 In: 2378.1 [I.V.:1978.1; IV Piggyback:400] Out: 1601 [Urine:1600; Stool:1] Intake/Output this shift: No intake/output data recorded.  General appearance: alert and no distress Resp: normal work of breathing GI: soft, mildly distended, mildly tender suprapubic area  Lab Results:  Recent Labs    10/04/22 1254 10/04/22 1301 10/05/22 0421  WBC 17.1*  --  11.9*  HGB 16.8* 17.7* 14.0  HCT 48.4* 52.0* 41.2  PLT 335  --  269   BMET Recent Labs    10/05/22 0421 10/06/22 0425  NA 138 138  K 2.8* 2.4*  CL 98 103  CO2 30 27  GLUCOSE 163* 121*  BUN 17 8  CREATININE 0.67 0.53  CALCIUM 9.3 8.5*   PT/INR No results for input(s): "LABPROT", "INR" in the last 72 hours.  Studies/Results: DG Abd 1 View  Result Date: 10/06/2022 CLINICAL DATA:  [id number] SBO (small bowel obstruction) (HCC) [id number] EXAM: ABDOMEN - 1 VIEW COMPARISON:  10/04/2022 FINDINGS: Interval placement of enteric tube with distal tip projecting in the gastric fundus. Persistent distension of small bowel loops throughout the abdomen. Moderate volume of stool throughout the colon. No gross free intraperitoneal air. IMPRESSION: 1. Interval placement of enteric tube with distal tip projecting in the gastric fundus. 2. Persistent distension of small bowel loops throughout the abdomen. Electronically Signed   By:  Duanne Guess D.O.   On: 10/06/2022 09:12   DG Chest Portable 1 View  Result Date: 10/04/2022 CLINICAL DATA:  NG tube placement. Abdominal pain with nausea and vomiting since last night. EXAM: PORTABLE CHEST 1 VIEW COMPARISON:  10/08/2020 FINDINGS: Heart size and pulmonary vascularity are normal for technique. Mild scarring and calcified granulomas in the upper lungs, likely postinflammatory. No airspace disease or consolidation in the lungs. No pleural effusions. No pneumothorax. Mediastinal contours appear intact. An enteric tube has been placed with tip projecting over the left upper quadrant consistent with location in the body of the stomach. Residual contrast material in the renal collecting systems. IMPRESSION: 1. No evidence of active pulmonary disease. 2. Enteric tube tip projects over the left upper quadrant consistent with location in the body of the stomach. Electronically Signed   By: Burman Nieves M.D.   On: 10/04/2022 15:31   CT ABDOMEN PELVIS W CONTRAST  Result Date: 10/04/2022 CLINICAL DATA:  One day history of periumbilical abdominal pain associated with nausea and vomiting EXAM: CT ABDOMEN AND PELVIS WITH CONTRAST TECHNIQUE: Multidetector CT imaging of the abdomen and pelvis was performed using the standard protocol following bolus administration of intravenous contrast. RADIATION DOSE REDUCTION: This exam was performed according to the departmental dose-optimization program which includes automated exposure control, adjustment of the mA and/or kV according to patient size and/or use of iterative reconstruction technique. CONTRAST:  60mL OMNIPAQUE IOHEXOL 300 MG/ML  SOLN COMPARISON:  CT abdomen and pelvis dated 09/21/2020 FINDINGS: Lower  chest: No focal consolidation or pulmonary nodule in the lung bases. No pleural effusion or pneumothorax demonstrated. Partially imaged heart size is normal. Coronary artery calcifications. Hepatobiliary: No focal hepatic lesions. No intra or  extrahepatic biliary ductal dilation. Normal gallbladder. Pancreas: No focal lesions or main ductal dilation. Spleen: Normal in size without focal abnormality. Adrenals/Urinary Tract: No adrenal nodules. Atrophic left kidney. No suspicious renal mass, calculi or hydronephrosis. No focal bladder wall thickening. Stomach/Bowel: Normal appearance of the stomach. Marked small bowel dilation with abrupt caliber transition in the right lower quadrant (2:44, 5:60). Moderate volume stool within the ascending and transverse colon. The descending colon is decompressed. Appendix is not discretely seen. Vascular/Lymphatic: Aortic atherosclerosis. No enlarged abdominal or pelvic lymph nodes. Reproductive: No adnexal masses. Other: Trace free fluid.  No free air or fluid collection. Musculoskeletal: No acute or abnormal lytic or blastic osseous lesions. IMPRESSION: 1. Marked small bowel dilation with abrupt caliber transition in the right lower quadrant, consistent with small-bowel obstruction. 2. Moderate volume stool within the ascending and transverse colon. 3. Atrophic left kidney. 4. Aortic Atherosclerosis (ICD10-I70.0). Coronary artery calcifications. Assessment for potential risk factor modification, dietary therapy or pharmacologic therapy may be warranted, if clinically indicated. Electronically Signed   By: Agustin Cree M.D.   On: 10/04/2022 14:00    Anti-infectives: Anti-infectives (From admission, onward)    None       Assessment/Plan: Patient with SBO, not resolving.  Plavix will be off 5 days 7/16 I think SBFT tomorrow to see if the obstruction will resolve If not then can get surgery later in week once Plavix worn off NG, NPO Continue suppositories  Dr. Lovell Sheehan covering this week and I will let him know the plan    LOS: 2 days    Kendra Lee 10/06/2022

## 2022-10-07 ENCOUNTER — Inpatient Hospital Stay (HOSPITAL_COMMUNITY): Payer: 59

## 2022-10-07 DIAGNOSIS — K56609 Unspecified intestinal obstruction, unspecified as to partial versus complete obstruction: Secondary | ICD-10-CM | POA: Diagnosis not present

## 2022-10-07 LAB — URINALYSIS, ROUTINE W REFLEX MICROSCOPIC
Bilirubin Urine: NEGATIVE
Glucose, UA: NEGATIVE mg/dL
Hgb urine dipstick: NEGATIVE
Ketones, ur: NEGATIVE mg/dL
Leukocytes,Ua: NEGATIVE
Nitrite: NEGATIVE
Protein, ur: NEGATIVE mg/dL
Specific Gravity, Urine: 1.005 (ref 1.005–1.030)
pH: 7 (ref 5.0–8.0)

## 2022-10-07 LAB — RENAL FUNCTION PANEL
Albumin: 2.8 g/dL — ABNORMAL LOW (ref 3.5–5.0)
Anion gap: 7 (ref 5–15)
BUN: <5 mg/dL — ABNORMAL LOW (ref 8–23)
CO2: 25 mmol/L (ref 22–32)
Calcium: 7.5 mg/dL — ABNORMAL LOW (ref 8.9–10.3)
Chloride: 104 mmol/L (ref 98–111)
Creatinine, Ser: 0.46 mg/dL (ref 0.44–1.00)
GFR, Estimated: >60 mL/min (ref >60)
Glucose, Bld: 117 mg/dL — ABNORMAL HIGH (ref 70–99)
Phosphorus: 1.6 mg/dL — ABNORMAL LOW (ref 2.5–4.6)
Potassium: 2.3 mmol/L — CL (ref 3.5–5.1)
Sodium: 136 mmol/L (ref 135–145)

## 2022-10-07 MED ORDER — DIATRIZOATE MEGLUMINE & SODIUM 66-10 % PO SOLN
90.0000 mL | Freq: Once | ORAL | Status: DC
  Filled 2022-10-07: qty 90

## 2022-10-07 MED ORDER — POTASSIUM PHOSPHATES 15 MMOLE/5ML IV SOLN
30.0000 mmol | Freq: Once | INTRAVENOUS | Status: AC
  Administered 2022-10-07: 30 mmol via INTRAVENOUS
  Filled 2022-10-07: qty 10

## 2022-10-07 MED ORDER — POTASSIUM CHLORIDE 10 MEQ/100ML IV SOLN
10.0000 meq | INTRAVENOUS | Status: AC
  Administered 2022-10-07 (×4): 10 meq via INTRAVENOUS
  Filled 2022-10-07 (×4): qty 100

## 2022-10-07 NOTE — Progress Notes (Signed)
  Subjective: Even though 1 bowel movement is recorded, patient states she is not passing gas or had a bowel movement.  No significant abdominal pain noted.  Objective: Vital signs in last 24 hours: Temp:  [98 F (36.7 C)-99.1 F (37.3 C)] 98.6 F (37 C) (07/15 0419) Pulse Rate:  [63-71] 69 (07/15 0419) Resp:  [16-20] 16 (07/15 0419) BP: (161-170)/(68-78) 170/74 (07/15 0419) SpO2:  [91 %-94 %] 92 % (07/15 0419) Last BM Date : 10/06/22  Intake/Output from previous day: 07/14 0701 - 07/15 0700 In: 1076.2 [I.V.:889.1; IV Piggyback:187.1] Out: 1050 [Urine:1050] Intake/Output this shift: No intake/output data recorded.  General appearance: alert, cooperative, and no distress GI: Soft but distended.  No tenderness noted.  Occasional bowel sounds appreciated.  Lab Results:  Recent Labs    10/04/22 1254 10/04/22 1301 10/05/22 0421  WBC 17.1*  --  11.9*  HGB 16.8* 17.7* 14.0  HCT 48.4* 52.0* 41.2  PLT 335  --  269   BMET Recent Labs    10/06/22 0425 10/07/22 0522  NA 138 136  K 2.4* 2.3*  CL 103 104  CO2 27 25  GLUCOSE 121* 117*  BUN 8 <5*  CREATININE 0.53 0.46  CALCIUM 8.5* 7.5*   PT/INR No results for input(s): "LABPROT", "INR" in the last 72 hours.  Studies/Results: DG Abd 1 View  Result Date: 10/06/2022 CLINICAL DATA:  [id number] SBO (small bowel obstruction) (HCC) [id number] EXAM: ABDOMEN - 1 VIEW COMPARISON:  10/04/2022 FINDINGS: Interval placement of enteric tube with distal tip projecting in the gastric fundus. Persistent distension of small bowel loops throughout the abdomen. Moderate volume of stool throughout the colon. No gross free intraperitoneal air. IMPRESSION: 1. Interval placement of enteric tube with distal tip projecting in the gastric fundus. 2. Persistent distension of small bowel loops throughout the abdomen. Electronically Signed   By: Duanne Guess D.O.   On: 10/06/2022 09:12    Anti-infectives: Anti-infectives (From admission, onward)    None        Assessment/Plan: Impression: Small bowel obstruction.  Significant hypokalemia.  Would supplement potassium to get the value greater than 4.  Would also supplement phosphorus level.  Will get small bowel obstruction follow-through protocol today.  LOS: 3 days    Franky Macho 10/07/2022

## 2022-10-07 NOTE — Progress Notes (Signed)
PROGRESS NOTE     Kendra Lee, is a 67 y.o. female, DOB - Feb 08, 1956, ZOX:[id number]  Admit date - 10/04/2022   Admitting Physician Shon Hale, MD  Outpatient Primary MD for the patient is Benetta Spar, MD  LOS - 3  Chief Complaint  Patient presents with   Abdominal Pain      Brief Narrative:  66 y.o. female with past medical history relevant for prior stroke in June 2022,, status post prior gastrostomy tube placed in June  2022 subsequently removed in October 2022, tobacco abuse and HTN admitted on 10/04/2022 with SBO    -Assessment and Plan: 1)SBO--- patient previously had a gastrostomy tube in 2022 as well as prior hysterectomy in the 1980s -Suspect adhesion related -General surgery consult appreciated -Continue NG tube -As needed antiemetics -As needed pain medications -Dulcolax suppository as ordered -Hold Plavix 10/07/22 Had small mucousy BM after Dulcolax suppository on 10/06/22... No further BM otherwise, not passing gas -Repeat KUB on 10/06/2022 shows persistent obstruction -Awaiting SBFT on 10/07/2022 -If needed possible surgery on 10/08/2022 after allowing for 5 days of Eliquis washout   2)History of prior stroke--back in 2022 with dysphagia requiring G-tube placement at that time -Hold Plavix as patient may need surgery for #1 above -Okay to continue aspirin and atorvastatin for secondary stroke prophylaxis   3)Tobacco abuse--smoking cessation advised patient not ready to quit smoking  --c/n Nicotine patch   4)HTN--hold losartan due to risk of dehydration and AKI -Continue topical clonidine patch -IV hydralazine as needed as needed   5)Hypokalemia/Hypophosphatemia--due to GI losses and poor intake -Replace IV as patient is currently n.p.o. with NG tube in situ  6)Leukocytosis--most likely reactive due to #1 above WBC 17.1 >>>11.9   -Protonix for GI prophylaxis and SCDs for DVT prophylaxis  Status is: Inpatient   Disposition: The  patient is from: Home              Anticipated d/c is to: Home              Anticipated d/c date is: > 3 days              Patient currently is not medically stable to d/c. Barriers: Not Clinically Stable-   Code Status :  -  Code Status: Full Code   Family Communication:    NA (patient is alert, awake and coherent)   DVT Prophylaxis  :   - SCDs  heparin injection 5,000 Units Start: 10/04/22 2200 SCDs Start: 10/04/22 1444 Place TED hose Start: 10/04/22 1444   Lab Results  Component Value Date   PLT 269 10/05/2022   Inpatient Medications  Scheduled Meds:  amLODipine  5 mg Oral Daily   bisacodyl  10 mg Rectal Once   bisacodyl  10 mg Rectal BID   cloNIDine  0.1 mg Transdermal Weekly   diatrizoate meglumine-sodium  90 mL Per NG tube Once   heparin  5,000 Units Subcutaneous Q8H   nicotine  21 mg Transdermal Daily   pantoprazole (PROTONIX) IV  40 mg Intravenous Q24H   sodium chloride flush  3 mL Intravenous Q12H   sodium chloride flush  3 mL Intravenous Q12H   Continuous Infusions:  sodium chloride     dextrose 5 % and 0.9 % NaCl 125 mL/hr at 10/07/22 8119   potassium chloride     potassium PHOSPHATE IVPB (in mmol)     sodium chloride     PRN Meds:.sodium chloride, acetaminophen **OR** acetaminophen, fentaNYL (SUBLIMAZE) injection,  hydrALAZINE, ondansetron **OR** ondansetron (ZOFRAN) IV, polyethylene glycol, sodium chloride flush, traZODone   Anti-infectives (From admission, onward)    None      Subjective: Kendra Lee today has no fevers, no further emesis,  No chest pain,  - Had small mucousy BM after Dulcolax suppository yesterday---- - No further BMs otherwise -Not passing gas -NG output noted  Objective: Vitals:   10/06/22 1326 10/06/22 1433 10/06/22 2009 10/07/22 0419  BP: (!) 170/68 (!) 165/68 (!) 169/78 (!) 170/74  Pulse: 63 71 71 69  Resp: 17 16 20 16   Temp: 98 F (36.7 C)  99.1 F (37.3 C) 98.6 F (37 C)  TempSrc:   Oral Oral  SpO2: 91% 94%  94% 92%  Weight:      Height:        Intake/Output Summary (Last 24 hours) at 10/07/2022 0753 Last data filed at 10/07/2022 0500 Gross per 24 hour  Intake 1076.16 ml  Output 1050 ml  Net 26.16 ml   Filed Weights   10/04/22 1201  Weight: 44 kg   Physical Exam Gen:- Awake Alert, in no apparent distress,chronically ill and cachectic appearing HEENT:- [place].AT, No sclera icterus Nose- NG tube with gastric contents Neck-Supple Neck,No JVD,.  Lungs-  CTAB , fair symmetrical air movement CV- S1, S2 normal, regular  Abd-  - soft,,uncomfortable with palpation, bowel sounds diminished, healed prior gastrostomy scar   Extremity/Skin:- No  edema, pedal pulses present  Psych-affect is appropriate, oriented x3 Neuro-no new focal deficits, no tremors  Data Reviewed: I have personally reviewed following labs and imaging studies  CBC: Recent Labs  Lab 10/04/22 1254 10/04/22 1301 10/05/22 0421  WBC 17.1*  --  11.9*  NEUTROABS 15.0*  --   --   HGB 16.8* 17.7* 14.0  HCT 48.4* 52.0* 41.2  MCV 92.7  --  94.5  PLT 335  --  269   Basic Metabolic Panel: Recent Labs  Lab 10/04/22 1254 10/04/22 1301 10/05/22 0421 10/06/22 0425 10/07/22 0522  NA 136 136 138 138 136  K 3.9 3.9 2.8* 2.4* 2.3*  CL 87* 90* 98 103 104  CO2 34*  --  30 27 25   GLUCOSE 138* 137* 163* 121* 117*  BUN 22 23 17 8  <5*  CREATININE 1.01* 1.00 0.67 0.53 0.46  CALCIUM 11.1*  --  9.3 8.5* 7.5*  MG  --   --   --  1.9  --   PHOS  --   --   --  1.6* 1.6*   GFR: Estimated Creatinine Clearance: 46.8 mL/min (by C-G formula based on SCr of 0.46 mg/dL). Liver Function Tests: Recent Labs  Lab 10/04/22 1254 10/06/22 0425 10/07/22 0522  AST 29  --   --   ALT 27  --   --   ALKPHOS 49  --   --   BILITOT 0.6  --   --   PROT 8.5*  --   --   ALBUMIN 5.0 3.4* 2.8*   Radiology Studies: DG Abd 1 View  Result Date: 10/06/2022 CLINICAL DATA:  [id number] SBO (small bowel obstruction) (HCC) [id number] EXAM: ABDOMEN - 1 VIEW  COMPARISON:  10/04/2022 FINDINGS: Interval placement of enteric tube with distal tip projecting in the gastric fundus. Persistent distension of small bowel loops throughout the abdomen. Moderate volume of stool throughout the colon. No gross free intraperitoneal air. IMPRESSION: 1. Interval placement of enteric tube with distal tip projecting in the gastric fundus. 2. Persistent distension of small bowel loops  throughout the abdomen. Electronically Signed   By: Duanne Guess D.O.   On: 10/06/2022 09:12    Scheduled Meds:  amLODipine  5 mg Oral Daily   bisacodyl  10 mg Rectal Once   bisacodyl  10 mg Rectal BID   cloNIDine  0.1 mg Transdermal Weekly   diatrizoate meglumine-sodium  90 mL Per NG tube Once   heparin  5,000 Units Subcutaneous Q8H   nicotine  21 mg Transdermal Daily   pantoprazole (PROTONIX) IV  40 mg Intravenous Q24H   sodium chloride flush  3 mL Intravenous Q12H   sodium chloride flush  3 mL Intravenous Q12H   Continuous Infusions:  sodium chloride     dextrose 5 % and 0.9 % NaCl 125 mL/hr at 10/07/22 4098   potassium chloride     potassium PHOSPHATE IVPB (in mmol)     sodium chloride      LOS: 3 days   Shon Hale M.D on 10/07/2022 at 7:53 AM  Go to [email or web address] - for contact info  [facility] - Office  [phone]  If 7PM-7AM, please contact night-coverage [email or web address] 10/07/2022, 7:53 AM

## 2022-10-07 NOTE — Plan of Care (Signed)
  Problem: Education: Goal: Knowledge of General Education information will improve Description: Including pain rating scale, medication(s)/side effects and non-pharmacologic comfort measures Outcome: Progressing   Problem: Health Behavior/Discharge Planning: Goal: Ability to manage health-related needs will improve Outcome: Progressing   Problem: Clinical Measurements: Goal: Ability to maintain clinical measurements within normal limits will improve Outcome: Progressing Goal: Will remain free from infection Outcome: Progressing Goal: Respiratory complications will improve Outcome: Progressing Goal: Cardiovascular complication will be avoided Outcome: Progressing   Problem: Coping: Goal: Level of anxiety will decrease Outcome: Progressing   Problem: Elimination: Goal: Will not experience complications related to urinary retention Outcome: Progressing   Problem: Pain Managment: Goal: General experience of comfort will improve Outcome: Progressing   Problem: Safety: Goal: Ability to remain free from injury will improve Outcome: Progressing   Problem: Skin Integrity: Goal: Risk for impaired skin integrity will decrease Outcome: Progressing   Problem: Clinical Measurements: Goal: Diagnostic test results will improve Outcome: Not Progressing   Problem: Activity: Goal: Risk for activity intolerance will decrease Outcome: Not Progressing   Problem: Nutrition: Goal: Adequate nutrition will be maintained Outcome: Not Progressing   Problem: Elimination: Goal: Will not experience complications related to bowel motility Outcome: Not Progressing

## 2022-10-07 NOTE — Progress Notes (Signed)
Critical K of 2.3 called on patient at 0655 this am.   Oncoming nurse Merry Proud) made aware and provider paged to notify

## 2022-10-07 NOTE — Progress Notes (Signed)
Pt had large bowel movement. Peri Care given, new purwick placed and pt repositioned in bed. MD notified regarding BM

## 2022-10-08 DIAGNOSIS — K56609 Unspecified intestinal obstruction, unspecified as to partial versus complete obstruction: Secondary | ICD-10-CM | POA: Diagnosis not present

## 2022-10-08 LAB — PHOSPHORUS: Phosphorus: 2.1 mg/dL — ABNORMAL LOW (ref 2.5–4.6)

## 2022-10-08 LAB — BASIC METABOLIC PANEL
Anion gap: 7 (ref 5–15)
BUN: <5 mg/dL — ABNORMAL LOW (ref 8–23)
CO2: 24 mmol/L (ref 22–32)
Calcium: 7.6 mg/dL — ABNORMAL LOW (ref 8.9–10.3)
Chloride: 105 mmol/L (ref 98–111)
Creatinine, Ser: 0.43 mg/dL — ABNORMAL LOW (ref 0.44–1.00)
GFR, Estimated: >60 mL/min (ref >60)
Glucose, Bld: 104 mg/dL — ABNORMAL HIGH (ref 70–99)
Potassium: 2.6 mmol/L — CL (ref 3.5–5.1)
Sodium: 136 mmol/L (ref 135–145)

## 2022-10-08 MED ORDER — AMLODIPINE BESYLATE 5 MG PO TABS: 5.0000 mg | ORAL_TABLET | Freq: Once | ORAL | Status: DC

## 2022-10-08 MED ORDER — POTASSIUM CHLORIDE CRYS ER 20 MEQ PO TBCR
40.0000 meq | EXTENDED_RELEASE_TABLET | ORAL | Status: AC
  Administered 2022-10-08 (×2): 40 meq via ORAL
  Filled 2022-10-08 (×2): qty 2

## 2022-10-08 MED ORDER — AMLODIPINE BESYLATE 5 MG PO TABS
10.0000 mg | ORAL_TABLET | Freq: Every day | ORAL | Status: DC
  Administered 2022-10-09: 10 mg via ORAL
  Filled 2022-10-08: qty 2

## 2022-10-08 MED ORDER — POTASSIUM CHLORIDE 10 MEQ/100ML IV SOLN
10.0000 meq | INTRAVENOUS | Status: AC
  Administered 2022-10-08 (×4): 10 meq via INTRAVENOUS
  Filled 2022-10-08 (×4): qty 100

## 2022-10-08 MED ORDER — POTASSIUM PHOSPHATES 15 MMOLE/5ML IV SOLN
30.0000 mmol | Freq: Once | INTRAVENOUS | Status: AC
  Administered 2022-10-08: 30 mmol via INTRAVENOUS
  Filled 2022-10-08: qty 10

## 2022-10-08 NOTE — Progress Notes (Signed)
Mobility Specialist Progress Note:   10/08/22 1128  Mobility  Activity Transferred from bed to chair  Level of Assistance Minimal assist, patient does 75% or more  Assistive Device None  Distance Ambulated (ft) 6 ft  Range of Motion/Exercises Active;All extremities  Activity Response Tolerated well  Mobility Referral Yes  $Mobility charge 1 Mobility  Mobility Specialist Start Time (ACUTE ONLY) 1115  Mobility Specialist Stop Time (ACUTE ONLY) 1128  Mobility Specialist Time Calculation (min) (ACUTE ONLY) 13 min   Pt received in bed, agreeable to mobility session. Tolerated well, asx throughout. No AD required, MinA to stand. Left pt in chair, alarm on, all needs met.  Feliciana Rossetti Mobility Specialist Please contact via Special educational needs teacher or  Rehab office at [phone]

## 2022-10-08 NOTE — Progress Notes (Signed)
Subjective: Patient has had multiple bowel movements over the past 24 hours.  She denies any significant abdominal pain.  Objective: Vital signs in last 24 hours: Temp:  [98.3 F (36.8 C)-98.7 F (37.1 C)] 98.3 F (36.8 C) (07/16 0443) Pulse Rate:  [67-73] 70 (07/16 0443) Resp:  [18-20] 18 (07/16 0443) BP: (149-173)/(68-82) 149/82 (07/16 0443) SpO2:  [92 %-94 %] 92 % (07/16 0443) Last BM Date : 10/07/22  Intake/Output from previous day: 07/15 0701 - 07/16 0700 In: 33 [I.V.:3; NG/GT:30] Out: 500 [Urine:500] Intake/Output this shift: No intake/output data recorded.  General appearance: alert, cooperative, and no distress GI: soft, non-tender; bowel sounds normal; no masses,  no organomegaly  Lab Results:  No results for input(s): "WBC", "HGB", "HCT", "PLT" in the last 72 hours. BMET Recent Labs    10/06/22 0425 10/07/22 0522  NA 138 136  K 2.4* 2.3*  CL 103 104  CO2 27 25  GLUCOSE 121* 117*  BUN 8 <5*  CREATININE 0.53 0.46  CALCIUM 8.5* 7.5*   PT/INR No results for input(s): "LABPROT", "INR" in the last 72 hours.  Studies/Results: DG Abd Portable 1V-Small Bowel Obstruction Protocol-initial, 8 hr delay  Result Date: 10/07/2022 CLINICAL DATA:  8 hour delay for small-bowel follow-through EXAM: PORTABLE ABDOMEN - 1 VIEW COMPARISON:  Abdominal radiograph 10/07/2022 at 5:11 p.m. FINDINGS: Progression of contrast through the transverse colon and into the descending colon and rectum. Similar to slightly improved dilation of the small bowel. Enteric tube tip and side-port in the stomach. IMPRESSION: Findings are compatible with ileus. Electronically Signed   By: Minerva Fester M.D.   On: 10/07/2022 20:39   DG ABD ACUTE 2+V W 1V CHEST  Result Date: 10/07/2022 CLINICAL DATA:  Small-bowel obstruction EXAM: DG ABDOMEN ACUTE WITH 1 VIEW CHEST COMPARISON:  Radiographs 10/07/2022 at 9:41 a.m. FINDINGS: Enteric tube tip and side-port in the stomach. Similar gaseous dilation of  loops of small bowel. Contrast is present within the right colon. Heart size and mediastinal contours are within normal limits. Both lungs are clear. IMPRESSION: 1. More dilated gas-filled loops of small bowel with enteric contrast present in the right colon suggesting ileus. 2. Enteric tube tip and side-port in the stomach. Electronically Signed   By: Minerva Fester M.D.   On: 10/07/2022 20:38   DG Abd Portable 1V-Small Bowel Protocol-Position Verification  Result Date: 10/07/2022 CLINICAL DATA:  67 year old female NG tube placement. EXAM: PORTABLE ABDOMEN - 1 VIEW COMPARISON:  10/06/2022 and earlier. FINDINGS: Portable AP supine view at 0941 hours. Unchanged enteric tube position looped in the left upper quadrant. Side hole at the level of the proximal gastric body. Partially visible lung bases appear clear. Bowel-gas pattern not significantly improved when compared to scout view of CT Abdomen and Pelvis 10/04/2022. No pneumoperitoneum evident on this supine view. Stable visualized osseous structures. IMPRESSION: 1. Stable, satisfactory enteric tube placement into the stomach. 2. Bowel-gas pattern suggesting SBO is not significantly improved. Electronically Signed   By: Odessa Fleming M.D.   On: 10/07/2022 12:29    Anti-infectives: Anti-infectives (From admission, onward)    None       Assessment/Plan: Impression: Partial small bowel obstruction, resolving.  Small bowel obstruction protocol negative for a transition zone or mechanical obstruction.  She still does have some bowel dilatation.  Her NG tube output has been minimal. Plan: Will remove NG tube and advance to full liquid diet.  No need for acute surgical intervention.  LOS: 4 days    Kendra Lee  Kendra Lee 10/08/2022

## 2022-10-08 NOTE — Plan of Care (Signed)

## 2022-10-08 NOTE — Progress Notes (Signed)
PROGRESS NOTE  Kendra Lee, is a 67 y.o. female, DOB - Oct 30, 1955, [id number]  Admit date - 10/04/2022   Admitting Physician Ashland Osmer Mariea Clonts, MD  Outpatient Primary MD for the patient is Benetta Spar, MD  LOS - 4  Chief Complaint  Patient presents with   Abdominal Pain      Brief Narrative:  67 y.o. female with past medical history relevant for prior stroke in June 2022,, status post prior gastrostomy tube placed in June  2022 subsequently removed in October 2022, tobacco abuse and HTN admitted on 10/04/2022 with SBO -SBO/ILeus--- NG tube removed on 10/08/2022 possible discharge home on 10/09/2022 if tolerating oral intake well    -Assessment and Plan: 1)SBO?Ileus--- patient previously had a gastrostomy tube in 2022 as well as prior hysterectomy in the 1980s -Suspect adhesion related -General surgery consult appreciated -Continue NG tube -As needed antiemetics -As needed pain medications -Dulcolax suppository as ordered -Hold Plavix 10/08/22 Had SBFT on 10/07/2022 -Has had multiple BMs over the last 24 hours -Repeat abdominal x-ray suggested ileus with bowel dilatation  -NG tube removed on 10/08/2022 -Advance to full liquid diet   2)History of prior stroke--back in 2022 with dysphagia requiring G-tube placement at that time -Hold Plavix as patient may need surgery for #1 above -Okay to continue aspirin and atorvastatin for secondary stroke prophylaxis -Please restart Plavix over the next 1 to 2 days if bowel function continues to improve and surgery seems unlikely   3)Tobacco abuse--smoking cessation advised patient not ready to quit smoking  --c/n Nicotine patch   4)HTN--hold losartan due to risk of dehydration and AKI --in the setting of SBO and poor oral intake -c/n Amlodipine as ordered -Continue topical clonidine patch -IV hydralazine as needed as needed   5)Hypokalemia/Hypophosphatemia--rather Persistent, due to GI losses and poor intake -IV and  orally  6)Leukocytosis--most likely reactive due to #1 above WBC 17.1 >>>11.9   -Protonix for GI prophylaxis and SCDs for DVT prophylaxis  Status is: Inpatient   Disposition: The patient is from: Home              Anticipated d/c is to: Home              Anticipated d/c date is: 1 day              Patient currently is not medically stable to d/c. Barriers: Not Clinically Stable-   Code Status :  -  Code Status: Full Code   Family Communication:    NA (patient is alert, awake and coherent)   DVT Prophylaxis  :   - SCDs  heparin injection 5,000 Units Start: 10/04/22 2200 SCDs Start: 10/04/22 1444 Place TED hose Start: 10/04/22 1444   Lab Results  Component Value Date   PLT 269 10/05/2022   Inpatient Medications  Scheduled Meds:  amLODipine  5 mg Oral Daily   bisacodyl  10 mg Rectal Once   bisacodyl  10 mg Rectal BID   cloNIDine  0.1 mg Transdermal Weekly   diatrizoate meglumine-sodium  90 mL Per NG tube Once   heparin  5,000 Units Subcutaneous Q8H   nicotine  21 mg Transdermal Daily   pantoprazole (PROTONIX) IV  40 mg Intravenous Q24H   sodium chloride flush  3 mL Intravenous Q12H   sodium chloride flush  3 mL Intravenous Q12H   Continuous Infusions:  sodium chloride     dextrose 5 % and 0.9 % NaCl 125 mL/hr at 10/08/22 0010   sodium  chloride     PRN Meds:.sodium chloride, acetaminophen **OR** acetaminophen, fentaNYL (SUBLIMAZE) injection, hydrALAZINE, ondansetron **OR** ondansetron (ZOFRAN) IV, polyethylene glycol, sodium chloride flush, traZODone   Anti-infectives (From admission, onward)    None      Subjective: Kendra Lee today has no fevers, no further emesis,  No chest pain,  - -Had multiple BMs after Gastrografin SBFT study -asking for more oral liquids  Objective: Vitals:   10/07/22 1259 10/07/22 1301 10/07/22 1948 10/08/22 0443  BP:  (!) 172/81 (!) 173/68 (!) 149/82  Pulse:  73 67 70  Resp:  20 18 18   Temp:  98.7 F (37.1 C) 98.3 F  (36.8 C) 98.3 F (36.8 C)  TempSrc: Oral Oral    SpO2:  94% 92% 92%  Weight:      Height:        Intake/Output Summary (Last 24 hours) at 10/08/2022 1150 Last data filed at 10/08/2022 0500 Gross per 24 hour  Intake 33 ml  Output 500 ml  Net -467 ml   Filed Weights   10/04/22 1201  Weight: 44 kg   Physical Exam Gen:- Awake Alert, in no apparent distress,chronically ill and cachectic appearing HEENT:- [place].AT, No sclera icterus Nose- NG tube -removed on 10/08/2022 Neck-Supple Neck,No JVD,.  Lungs-  CTAB , fair symmetrical air movement CV- S1, S2 normal, regular  Abd-  - soft,, healed prior gastrostomy scar  , improved abdominal distention, no significant tenderness Extremity/Skin:- No  edema, pedal pulses present  Psych-affect is appropriate, oriented x3 Neuro-no new focal deficits, no tremors  Data Reviewed: I have personally reviewed following labs and imaging studies  CBC: Recent Labs  Lab 10/04/22 1254 10/04/22 1301 10/05/22 0421  WBC 17.1*  --  11.9*  NEUTROABS 15.0*  --   --   HGB 16.8* 17.7* 14.0  HCT 48.4* 52.0* 41.2  MCV 92.7  --  94.5  PLT 335  --  269   Basic Metabolic Panel: Recent Labs  Lab 10/04/22 1254 10/04/22 1301 10/05/22 0421 10/06/22 0425 10/07/22 0522 10/08/22 1017  NA 136 136 138 138 136 136  K 3.9 3.9 2.8* 2.4* 2.3* 2.6*  CL 87* 90* 98 103 104 105  CO2 34*  --  30 27 25 24   GLUCOSE 138* 137* 163* 121* 117* 104*  BUN 22 23 17 8  <5* <5*  CREATININE 1.01* 1.00 0.67 0.53 0.46 0.43*  CALCIUM 11.1*  --  9.3 8.5* 7.5* 7.6*  MG  --   --   --  1.9  --   --   PHOS  --   --   --  1.6* 1.6* 2.1*   GFR: Estimated Creatinine Clearance: 46.8 mL/min (A) (by C-G formula based on SCr of 0.43 mg/dL (L)). Liver Function Tests: Recent Labs  Lab 10/04/22 1254 10/06/22 0425 10/07/22 0522  AST 29  --   --   ALT 27  --   --   ALKPHOS 49  --   --   BILITOT 0.6  --   --   PROT 8.5*  --   --   ALBUMIN 5.0 3.4* 2.8*   Radiology Studies: DG Abd  Portable 1V-Small Bowel Obstruction Protocol-initial, 8 hr delay  Result Date: 10/07/2022 CLINICAL DATA:  8 hour delay for small-bowel follow-through EXAM: PORTABLE ABDOMEN - 1 VIEW COMPARISON:  Abdominal radiograph 10/07/2022 at 5:11 p.m. FINDINGS: Progression of contrast through the transverse colon and into the descending colon and rectum. Similar to slightly improved dilation of the small  bowel. Enteric tube tip and side-port in the stomach. IMPRESSION: Findings are compatible with ileus. Electronically Signed   By: Minerva Fester M.D.   On: 10/07/2022 20:39   DG ABD ACUTE 2+V W 1V CHEST  Result Date: 10/07/2022 CLINICAL DATA:  Small-bowel obstruction EXAM: DG ABDOMEN ACUTE WITH 1 VIEW CHEST COMPARISON:  Radiographs 10/07/2022 at 9:41 a.m. FINDINGS: Enteric tube tip and side-port in the stomach. Similar gaseous dilation of loops of small bowel. Contrast is present within the right colon. Heart size and mediastinal contours are within normal limits. Both lungs are clear. IMPRESSION: 1. More dilated gas-filled loops of small bowel with enteric contrast present in the right colon suggesting ileus. 2. Enteric tube tip and side-port in the stomach. Electronically Signed   By: Minerva Fester M.D.   On: 10/07/2022 20:38   DG Abd Portable 1V-Small Bowel Protocol-Position Verification  Result Date: 10/07/2022 CLINICAL DATA:  67 year old female NG tube placement. EXAM: PORTABLE ABDOMEN - 1 VIEW COMPARISON:  10/06/2022 and earlier. FINDINGS: Portable AP supine view at 0941 hours. Unchanged enteric tube position looped in the left upper quadrant. Side hole at the level of the proximal gastric body. Partially visible lung bases appear clear. Bowel-gas pattern not significantly improved when compared to scout view of CT Abdomen and Pelvis 10/04/2022. No pneumoperitoneum evident on this supine view. Stable visualized osseous structures. IMPRESSION: 1. Stable, satisfactory enteric tube placement into the stomach. 2.  Bowel-gas pattern suggesting SBO is not significantly improved. Electronically Signed   By: Odessa Fleming M.D.   On: 10/07/2022 12:29    Scheduled Meds:  amLODipine  5 mg Oral Daily   bisacodyl  10 mg Rectal Once   bisacodyl  10 mg Rectal BID   cloNIDine  0.1 mg Transdermal Weekly   diatrizoate meglumine-sodium  90 mL Per NG tube Once   heparin  5,000 Units Subcutaneous Q8H   nicotine  21 mg Transdermal Daily   pantoprazole (PROTONIX) IV  40 mg Intravenous Q24H   sodium chloride flush  3 mL Intravenous Q12H   sodium chloride flush  3 mL Intravenous Q12H   Continuous Infusions:  sodium chloride     dextrose 5 % and 0.9 % NaCl 125 mL/hr at 10/08/22 0010   sodium chloride      LOS: 4 days   Shon Hale M.D on 10/08/2022 at 11:50 AM  Go to [email or web address] - for contact info  [facility] - Office  [phone]  If 7PM-7AM, please contact night-coverage [email or web address] 10/08/2022, 11:50 AM

## 2022-10-09 DIAGNOSIS — I10 Essential (primary) hypertension: Secondary | ICD-10-CM

## 2022-10-09 DIAGNOSIS — K56609 Unspecified intestinal obstruction, unspecified as to partial versus complete obstruction: Secondary | ICD-10-CM | POA: Diagnosis not present

## 2022-10-09 DIAGNOSIS — I639 Cerebral infarction, unspecified: Secondary | ICD-10-CM

## 2022-10-09 LAB — BASIC METABOLIC PANEL
Anion gap: 4 — ABNORMAL LOW (ref 5–15)
BUN: <5 mg/dL — ABNORMAL LOW (ref 8–23)
CO2: 26 mmol/L (ref 22–32)
Calcium: 8.2 mg/dL — ABNORMAL LOW (ref 8.9–10.3)
Chloride: 107 mmol/L (ref 98–111)
Creatinine, Ser: 0.53 mg/dL (ref 0.44–1.00)
GFR, Estimated: >60 mL/min (ref >60)
Glucose, Bld: 89 mg/dL (ref 70–99)
Potassium: 3.8 mmol/L (ref 3.5–5.1)
Sodium: 137 mmol/L (ref 135–145)

## 2022-10-09 LAB — CBC
HCT: 34.6 % — ABNORMAL LOW (ref 36.0–46.0)
Hemoglobin: 11.7 g/dL — ABNORMAL LOW (ref 12.0–15.0)
MCH: 32.4 pg (ref 26.0–34.0)
MCHC: 33.8 g/dL (ref 30.0–36.0)
MCV: 95.8 fL (ref 80.0–100.0)
Platelets: 242 10*3/uL (ref 150–400)
RBC: 3.61 MIL/uL — ABNORMAL LOW (ref 3.87–5.11)
RDW: 12.7 % (ref 11.5–15.5)
WBC: 6.8 10*3/uL (ref 4.0–10.5)
nRBC: 0 % (ref 0.0–0.2)

## 2022-10-09 MED ORDER — ASPIRIN 81 MG PO CHEW: 81.0000 mg | CHEWABLE_TABLET | Freq: Every day | ORAL | Status: DC

## 2022-10-09 MED ORDER — THIAMINE HCL 100 MG PO TABS: 100.0000 mg | ORAL_TABLET | Freq: Every day | ORAL | Status: DC

## 2022-10-09 NOTE — Discharge Instructions (Signed)
IMPORTANT INFORMATION: PAY CLOSE ATTENTION   PHYSICIAN DISCHARGE INSTRUCTIONS  Follow with Primary care provider  Fanta, Tesfaye Demissie, MD  and other consultants as instructed by your Hospitalist Physician  SEEK MEDICAL CARE OR RETURN TO EMERGENCY ROOM IF SYMPTOMS COME BACK, WORSEN OR NEW PROBLEM DEVELOPS   Please note: You were cared for by a hospitalist during your hospital stay. Every effort will be made to forward records to your primary care provider.  You can request that your primary care provider send for your hospital records if they have not received them.  Once you are discharged, your primary care physician will handle any further medical issues. Please note that NO REFILLS for any discharge medications will be authorized once you are discharged, as it is imperative that you return to your primary care physician (or establish a relationship with a primary care physician if you do not have one) for your post hospital discharge needs so that they can reassess your need for medications and monitor your lab values.  Please get a complete blood count and chemistry panel checked by your Primary MD at your next visit, and again as instructed by your Primary MD.  Get Medicines reviewed and adjusted: Please take all your medications with you for your next visit with your Primary MD  Laboratory/radiological data: Please request your Primary MD to go over all hospital tests and procedure/radiological results at the follow up, please ask your primary care provider to get all Hospital records sent to his/her office.  In some cases, they will be blood work, cultures and biopsy results pending at the time of your discharge. Please request that your primary care provider follow up on these results.  If you are diabetic, please bring your blood sugar readings with you to your follow up appointment with primary care.    Please call and make your follow up appointments as soon as possible.     Also Note the following: If you experience worsening of your admission symptoms, develop shortness of breath, life threatening emergency, suicidal or homicidal thoughts you must seek medical attention immediately by calling 911 or calling your MD immediately  if symptoms less severe.  You must read complete instructions/literature along with all the possible adverse reactions/side effects for all the Medicines you take and that have been prescribed to you. Take any new Medicines after you have completely understood and accpet all the possible adverse reactions/side effects.   Do not drive when taking Pain medications or sleeping medications (Benzodiazepines)  Do not take more than prescribed Pain, Sleep and Anxiety Medications. It is not advisable to combine anxiety,sleep and pain medications without talking with your primary care practitioner  Special Instructions: If you have smoked or chewed Tobacco  in the last 2 yrs please stop smoking, stop any regular Alcohol  and or any Recreational drug use.  Wear Seat belts while driving.  Do not drive if taking any narcotic, mind altering or controlled substances or recreational drugs or alcohol.       

## 2022-10-09 NOTE — Progress Notes (Signed)
Mobility Specialist Progress Note:    10/09/22 1024  Mobility  Activity Ambulated with assistance in hallway;Ambulated with assistance in room;Transferred from bed to chair  Level of Assistance Contact guard assist, steadying assist  Assistive Device Front wheel walker  Distance Ambulated (ft) 30 ft  Range of Motion/Exercises Active;All extremities  Activity Response Tolerated well  Mobility Referral Yes  $Mobility charge 1 Mobility  Mobility Specialist Start Time (ACUTE ONLY) 1000  Mobility Specialist Stop Time (ACUTE ONLY) 1015  Mobility Specialist Time Calculation (min) (ACUTE ONLY) 15 min   Pt received in bed, nursing staff in room. Pt agreeable to mobility session. Required MinA to sit EOB and stand, CGA during ambulation. Tolerated well, c/o leg weakness and fatigue. Returned pt to chair in room, alarm on, call bell in reach. Nursing staff notified.   Feliciana Rossetti Mobility Specialist Please contact via Special educational needs teacher or  Rehab office at [phone]

## 2022-10-09 NOTE — Discharge Summary (Signed)
Physician Discharge Summary  Kendra Lee ZOX:[id number] DOB: 19-Oct-1955 DOA: 10/04/2022  PCP: Benetta Spar, MD  Admit date: 10/04/2022 Discharge date: 10/09/2022  Admitted From:  Home  Disposition: Home   Recommendations for Outpatient Follow-up:  Follow up with PCP in 1 weeks  Discharge Condition: STABLE   CODE STATUS: FULL DIET: Soft foods recommended    Brief Hospitalization Summary: Please see all hospital notes, images, labs for full details of the hospitalization. ADMISSION PROVIDER HPI:  67 y.o. female with past medical history relevant for prior stroke in June 2022,, status post prior gastrostomy tube placed in June  2022 subsequently removed in October 2022, tobacco abuse and HTN who presents to the ED with abdominal pain nausea and vomiting -Had normal BM on 10/03/2022 -Episodes of vomiting have been persistent with more than a dozen today already, -Emesis is without blood or bile No fever  Or chills  -CT abdomen pelvis showed--IMPRESSION: 1. Marked small bowel dilation with abrupt caliber transition in the right lower quadrant, consistent with small-bowel obstruction. 2. Moderate volume stool within the ascending and transverse colon. 3. Atrophic left kidney. NG tube placed in the ED, follow-up x-ray after NG tube placement shows NG tube in the stomach and no acute cardiopulmonary findings -Lipase is 51 -Sodium is 136 potassium is 3.9 chloride 87 bicarb 34 glucose 138, creatinine is 1.0, --LFTs are not elevated -WBC 17.1 -Hemoglobin 16.8, platelets 335  HOSPITAL COURSE  Pt was admitted for conservative management of small bowel obstruction and followed by surgery.  Treated with conservative measures and diet slowly advanced to soft foods which have been tolerated very well.  Pain is resolved and patient eager to go home.  Patient was seen by surgery today and they are okay with her going home today as she is having multiple bowel movements now and tolerating  diet well.  Patient is being discharged home in stable condition.  Close outpatient follow up advised and pt verbalized understanding.    Discharge Diagnoses:  Principal Problem:   SBO (small bowel obstruction) (HCC) Active Problems:   CVA (cerebral vascular accident) [facility])   Essential hypertension   Discharge Instructions:  Allergies as of 10/09/2022   No Known Allergies      Medication List     TAKE these medications    acetaminophen 650 MG CR tablet Commonly known as: TYLENOL Take 1,300 mg by mouth every 8 (eight) hours as needed for pain.   alendronate 70 MG tablet Commonly known as: FOSAMAX Take 70 mg by mouth once a week.   aspirin 81 MG chewable tablet Chew 1 tablet (81 mg total) by mouth daily.   diphenhydrAMINE 25 mg capsule Commonly known as: BENADRYL Take 25 mg by mouth every 6 (six) hours as needed for sleep or allergies.   losartan-hydrochlorothiazide 100-12.5 MG tablet Commonly known as: HYZAAR Take 1 tablet by mouth daily.   mirtazapine 7.5 MG tablet Commonly known as: REMERON Take 7.5 mg by mouth at bedtime.   potassium chloride SA 20 MEQ tablet Commonly known as: KLOR-CON M Take 20 mEq by mouth daily.   thiamine 100 MG tablet Commonly known as: VITAMIN B1 Take 1 tablet (100 mg total) by mouth daily.        Follow-up Information     Fanta, Wayland Salinas, MD. Schedule an appointment as soon as possible for a visit in 1 week(s).   Specialty: Internal Medicine Why: Hospital Follow Up Contact information: [address] [place] [address] [phone]  Franky Macho, MD. Schedule an appointment as soon as possible for a visit.   Specialty: General Surgery Why: As needed Contact information: [address] [place] [address] [phone]                No Known Allergies Allergies as of 10/09/2022   No Known Allergies      Medication List     TAKE these medications     acetaminophen 650 MG CR tablet Commonly known as: TYLENOL Take 1,300 mg by mouth every 8 (eight) hours as needed for pain.   alendronate 70 MG tablet Commonly known as: FOSAMAX Take 70 mg by mouth once a week.   aspirin 81 MG chewable tablet Chew 1 tablet (81 mg total) by mouth daily.   diphenhydrAMINE 25 mg capsule Commonly known as: BENADRYL Take 25 mg by mouth every 6 (six) hours as needed for sleep or allergies.   losartan-hydrochlorothiazide 100-12.5 MG tablet Commonly known as: HYZAAR Take 1 tablet by mouth daily.   mirtazapine 7.5 MG tablet Commonly known as: REMERON Take 7.5 mg by mouth at bedtime.   potassium chloride SA 20 MEQ tablet Commonly known as: KLOR-CON M Take 20 mEq by mouth daily.   thiamine 100 MG tablet Commonly known as: VITAMIN B1 Take 1 tablet (100 mg total) by mouth daily.        Procedures/Studies: DG Abd Portable 1V-Small Bowel Obstruction Protocol-initial, 8 hr delay  Result Date: 10/07/2022 CLINICAL DATA:  8 hour delay for small-bowel follow-through EXAM: PORTABLE ABDOMEN - 1 VIEW COMPARISON:  Abdominal radiograph 10/07/2022 at 5:11 p.m. FINDINGS: Progression of contrast through the transverse colon and into the descending colon and rectum. Similar to slightly improved dilation of the small bowel. Enteric tube tip and side-port in the stomach. IMPRESSION: Findings are compatible with ileus. Electronically Signed   By: Minerva Fester M.D.   On: 10/07/2022 20:39   DG ABD ACUTE 2+V W 1V CHEST  Result Date: 10/07/2022 CLINICAL DATA:  Small-bowel obstruction EXAM: DG ABDOMEN ACUTE WITH 1 VIEW CHEST COMPARISON:  Radiographs 10/07/2022 at 9:41 a.m. FINDINGS: Enteric tube tip and side-port in the stomach. Similar gaseous dilation of loops of small bowel. Contrast is present within the right colon. Heart size and mediastinal contours are within normal limits. Both lungs are clear. IMPRESSION: 1. More dilated gas-filled loops of small bowel with  enteric contrast present in the right colon suggesting ileus. 2. Enteric tube tip and side-port in the stomach. Electronically Signed   By: Minerva Fester M.D.   On: 10/07/2022 20:38   DG Abd Portable 1V-Small Bowel Protocol-Position Verification  Result Date: 10/07/2022 CLINICAL DATA:  67 year old female NG tube placement. EXAM: PORTABLE ABDOMEN - 1 VIEW COMPARISON:  10/06/2022 and earlier. FINDINGS: Portable AP supine view at 0941 hours. Unchanged enteric tube position looped in the left upper quadrant. Side hole at the level of the proximal gastric body. Partially visible lung bases appear clear. Bowel-gas pattern not significantly improved when compared to scout view of CT Abdomen and Pelvis 10/04/2022. No pneumoperitoneum evident on this supine view. Stable visualized osseous structures. IMPRESSION: 1. Stable, satisfactory enteric tube placement into the stomach. 2. Bowel-gas pattern suggesting SBO is not significantly improved. Electronically Signed   By: Odessa Fleming M.D.   On: 10/07/2022 12:29   DG Abd 1 View  Result Date: 10/06/2022 CLINICAL DATA:  [id number] SBO (small bowel obstruction) (HCC) [id number] EXAM: ABDOMEN - 1 VIEW COMPARISON:  10/04/2022 FINDINGS: Interval placement of enteric tube with distal  tip projecting in the gastric fundus. Persistent distension of small bowel loops throughout the abdomen. Moderate volume of stool throughout the colon. No gross free intraperitoneal air. IMPRESSION: 1. Interval placement of enteric tube with distal tip projecting in the gastric fundus. 2. Persistent distension of small bowel loops throughout the abdomen. Electronically Signed   By: Duanne Guess D.O.   On: 10/06/2022 09:12   DG Chest Portable 1 View  Result Date: 10/04/2022 CLINICAL DATA:  NG tube placement. Abdominal pain with nausea and vomiting since last night. EXAM: PORTABLE CHEST 1 VIEW COMPARISON:  10/08/2020 FINDINGS: Heart size and pulmonary vascularity are normal for technique. Mild scarring  and calcified granulomas in the upper lungs, likely postinflammatory. No airspace disease or consolidation in the lungs. No pleural effusions. No pneumothorax. Mediastinal contours appear intact. An enteric tube has been placed with tip projecting over the left upper quadrant consistent with location in the body of the stomach. Residual contrast material in the renal collecting systems. IMPRESSION: 1. No evidence of active pulmonary disease. 2. Enteric tube tip projects over the left upper quadrant consistent with location in the body of the stomach. Electronically Signed   By: Burman Nieves M.D.   On: 10/04/2022 15:31   CT ABDOMEN PELVIS W CONTRAST  Result Date: 10/04/2022 CLINICAL DATA:  One day history of periumbilical abdominal pain associated with nausea and vomiting EXAM: CT ABDOMEN AND PELVIS WITH CONTRAST TECHNIQUE: Multidetector CT imaging of the abdomen and pelvis was performed using the standard protocol following bolus administration of intravenous contrast. RADIATION DOSE REDUCTION: This exam was performed according to the departmental dose-optimization program which includes automated exposure control, adjustment of the mA and/or kV according to patient size and/or use of iterative reconstruction technique. CONTRAST:  60mL OMNIPAQUE IOHEXOL 300 MG/ML  SOLN COMPARISON:  CT abdomen and pelvis dated 09/21/2020 FINDINGS: Lower chest: No focal consolidation or pulmonary nodule in the lung bases. No pleural effusion or pneumothorax demonstrated. Partially imaged heart size is normal. Coronary artery calcifications. Hepatobiliary: No focal hepatic lesions. No intra or extrahepatic biliary ductal dilation. Normal gallbladder. Pancreas: No focal lesions or main ductal dilation. Spleen: Normal in size without focal abnormality. Adrenals/Urinary Tract: No adrenal nodules. Atrophic left kidney. No suspicious renal mass, calculi or hydronephrosis. No focal bladder wall thickening. Stomach/Bowel: Normal  appearance of the stomach. Marked small bowel dilation with abrupt caliber transition in the right lower quadrant (2:44, 5:60). Moderate volume stool within the ascending and transverse colon. The descending colon is decompressed. Appendix is not discretely seen. Vascular/Lymphatic: Aortic atherosclerosis. No enlarged abdominal or pelvic lymph nodes. Reproductive: No adnexal masses. Other: Trace free fluid.  No free air or fluid collection. Musculoskeletal: No acute or abnormal lytic or blastic osseous lesions. IMPRESSION: 1. Marked small bowel dilation with abrupt caliber transition in the right lower quadrant, consistent with small-bowel obstruction. 2. Moderate volume stool within the ascending and transverse colon. 3. Atrophic left kidney. 4. Aortic Atherosclerosis (ICD10-I70.0). Coronary artery calcifications. Assessment for potential risk factor modification, dietary therapy or pharmacologic therapy may be warranted, if clinically indicated. Electronically Signed   By: Agustin Cree M.D.   On: 10/04/2022 14:00     Subjective: Pt tolerating diet well and wants to discharge home.    Discharge Exam: Vitals:   10/09/22 0527 10/09/22 1041  BP: (!) 166/84 (!) 153/72  Pulse: (!) 57 65  Resp: 18 20  Temp: 98.2 F (36.8 C) 98.6 F (37 C)  SpO2: 92% 94%   Vitals:  10/08/22 1409 10/08/22 2035 10/09/22 0527 10/09/22 1041  BP: 139/68 (!) 158/75 (!) 166/84 (!) 153/72  Pulse: 68 72 (!) 57 65  Resp:  17 18 20   Temp: 99 F (37.2 C) 99.2 F (37.3 C) 98.2 F (36.8 C) 98.6 F (37 C)  TempSrc: Oral Oral Oral Oral  SpO2: 94% 92% 92% 94%  Weight:      Height:        General: Pt is alert, awake, not in acute distress Cardiovascular: normal S1/S2 +, no rubs, no gallops Respiratory: CTA bilaterally, no wheezing, no rhonchi Abdominal: Soft, NT, ND, bowel sounds + Extremities: no edema, no cyanosis   The results of significant diagnostics from this hospitalization (including imaging, microbiology,  ancillary and laboratory) are listed below for reference.     Microbiology: No results found for this or any previous visit (from the past 240 hour(s)).   Labs: BNP (last 3 results) No results for input(s): "BNP" in the last 8760 hours. Basic Metabolic Panel: Recent Labs  Lab 10/05/22 0421 10/06/22 0425 10/07/22 0522 10/08/22 1017 10/09/22 0443  NA 138 138 136 136 137  K 2.8* 2.4* 2.3* 2.6* 3.8  CL 98 103 104 105 107  CO2 30 27 25 24 26   GLUCOSE 163* 121* 117* 104* 89  BUN 17 8 <5* <5* <5*  CREATININE 0.67 0.53 0.46 0.43* 0.53  CALCIUM 9.3 8.5* 7.5* 7.6* 8.2*  MG  --  1.9  --   --   --   PHOS  --  1.6* 1.6* 2.1*  --    Liver Function Tests: Recent Labs  Lab 10/04/22 1254 10/06/22 0425 10/07/22 0522  AST 29  --   --   ALT 27  --   --   ALKPHOS 49  --   --   BILITOT 0.6  --   --   PROT 8.5*  --   --   ALBUMIN 5.0 3.4* 2.8*   Recent Labs  Lab 10/04/22 1254  LIPASE 51   No results for input(s): "AMMONIA" in the last 168 hours. CBC: Recent Labs  Lab 10/04/22 1254 10/04/22 1301 10/05/22 0421 10/09/22 0443  WBC 17.1*  --  11.9* 6.8  NEUTROABS 15.0*  --   --   --   HGB 16.8* 17.7* 14.0 11.7*  HCT 48.4* 52.0* 41.2 34.6*  MCV 92.7  --  94.5 95.8  PLT 335  --  269 242   Cardiac Enzymes: No results for input(s): "CKTOTAL", "CKMB", "CKMBINDEX", "TROPONINI" in the last 168 hours. BNP: Invalid input(s): "POCBNP" CBG: No results for input(s): "GLUCAP" in the last 168 hours. D-Dimer No results for input(s): "DDIMER" in the last 72 hours. Hgb A1c No results for input(s): "HGBA1C" in the last 72 hours. Lipid Profile No results for input(s): "CHOL", "HDL", "LDLCALC", "TRIG", "CHOLHDL", "LDLDIRECT" in the last 72 hours. Thyroid function studies No results for input(s): "TSH", "T4TOTAL", "T3FREE", "THYROIDAB" in the last 72 hours.  Invalid input(s): "FREET3" Anemia work up No results for input(s): "VITAMINB12", "FOLATE", "FERRITIN", "TIBC", "IRON", "RETICCTPCT"  in the last 72 hours. Urinalysis    Component Value Date/Time   COLORURINE COLORLESS (A) 10/07/2022 2112   APPEARANCEUR CLEAR 10/07/2022 2112   LABSPEC 1.005 10/07/2022 2112   PHURINE 7.0 10/07/2022 2112   GLUCOSEU NEGATIVE 10/07/2022 2112   HGBUR NEGATIVE 10/07/2022 2112   BILIRUBINUR NEGATIVE 10/07/2022 2112   KETONESUR NEGATIVE 10/07/2022 2112   PROTEINUR NEGATIVE 10/07/2022 2112   NITRITE NEGATIVE 10/07/2022 2112   LEUKOCYTESUR NEGATIVE  10/07/2022 2112   Sepsis Labs Recent Labs  Lab 10/04/22 1254 10/05/22 0421 10/09/22 0443  WBC 17.1* 11.9* 6.8   Microbiology No results found for this or any previous visit (from the past 240 hour(s)).  Time coordinating discharge: 38 min  SIGNED:  Standley Dakins, MD  [facility] 10/09/2022, 12:22 PM How to contact the [facility] Attending or Consulting provider 7A - 7P or covering provider during after hours 7P -7A, for this patient?  Check the care team in [facility] and look for a) attending/consulting [facility] provider listed and b) the [facility] team listed Log into [email or web address] and use [facility] universal password to access. If you do not have the password, please contact the hospital operator. Locate the [facility] provider you are looking for under [facility] and page to a number that you can be directly reached. If you still have difficulty reaching the provider, please page the [facility] (Director on Call) for the Hospitalists listed on amion for assistance.

## 2022-10-09 NOTE — Progress Notes (Signed)
  Subjective: Patient has no complaints.  Objective: Vital signs in last 24 hours: Temp:  [98.2 F (36.8 C)-99.2 F (37.3 C)] 98.2 F (36.8 C) (07/17 0527) Pulse Rate:  [57-72] 57 (07/17 0527) Resp:  [17-18] 18 (07/17 0527) BP: (139-166)/(68-84) 166/84 (07/17 0527) SpO2:  [92 %-94 %] 92 % (07/17 0527) Last BM Date : 10/08/22  Intake/Output from previous day: 07/16 0701 - 07/17 0700 In: 5745.9 [P.O.:840; I.V.:4030.1; IV Piggyback:875.8] Out: -  Intake/Output this shift: No intake/output data recorded.  General appearance: alert, cooperative, and no distress GI: soft, non-tender; bowel sounds normal; no masses,  no organomegaly  Lab Results:  Recent Labs    10/09/22 0443  WBC 6.8  HGB 11.7*  HCT 34.6*  PLT 242   BMET Recent Labs    10/08/22 1017 10/09/22 0443  NA 136 137  K 2.6* 3.8  CL 105 107  CO2 24 26  GLUCOSE 104* 89  BUN <5* <5*  CREATININE 0.43* 0.53  CALCIUM 7.6* 8.2*   PT/INR No results for input(s): "LABPROT", "INR" in the last 72 hours.  Studies/Results: DG Abd Portable 1V-Small Bowel Obstruction Protocol-initial, 8 hr delay  Result Date: 10/07/2022 CLINICAL DATA:  8 hour delay for small-bowel follow-through EXAM: PORTABLE ABDOMEN - 1 VIEW COMPARISON:  Abdominal radiograph 10/07/2022 at 5:11 p.m. FINDINGS: Progression of contrast through the transverse colon and into the descending colon and rectum. Similar to slightly improved dilation of the small bowel. Enteric tube tip and side-port in the stomach. IMPRESSION: Findings are compatible with ileus. Electronically Signed   By: Minerva Fester M.D.   On: 10/07/2022 20:39   DG ABD ACUTE 2+V W 1V CHEST  Result Date: 10/07/2022 CLINICAL DATA:  Small-bowel obstruction EXAM: DG ABDOMEN ACUTE WITH 1 VIEW CHEST COMPARISON:  Radiographs 10/07/2022 at 9:41 a.m. FINDINGS: Enteric tube tip and side-port in the stomach. Similar gaseous dilation of loops of small bowel. Contrast is present within the right  colon. Heart size and mediastinal contours are within normal limits. Both lungs are clear. IMPRESSION: 1. More dilated gas-filled loops of small bowel with enteric contrast present in the right colon suggesting ileus. 2. Enteric tube tip and side-port in the stomach. Electronically Signed   By: Minerva Fester M.D.   On: 10/07/2022 20:38    Anti-infectives: Anti-infectives (From admission, onward)    None       Assessment/Plan: Impression: Partial small bowel obstruction, resolved.  Tolerating full liquid diet well. Plan: No need for surgical intervention at the present time.  Patient would like to go home and that is fine with me.  No need for follow-up from the surgical standpoint.  LOS: 5 days    Franky Macho 10/09/2022

## 2022-10-09 NOTE — Care Management Important Message (Signed)
Important Message  Patient Details  Name: Kendra Lee MRN: [id number] Date of Birth: [birth year]   Medicare Important Message Given:  Yes     Corey Harold 10/09/2022, 11:51 AM

## 2022-10-09 NOTE — Progress Notes (Signed)
Patient had no complaints of pain this shift and no bowel movement. Patient refused Dulcolax suppository for this shift, but did later request something to help her sleep, Trazodone given. Continued to monitor.

## 2022-10-17 DIAGNOSIS — I10 Essential (primary) hypertension: Secondary | ICD-10-CM | POA: Diagnosis not present

## 2022-10-17 DIAGNOSIS — K5900 Constipation, unspecified: Secondary | ICD-10-CM | POA: Diagnosis not present

## 2022-10-17 DIAGNOSIS — K56600 Partial intestinal obstruction, unspecified as to cause: Secondary | ICD-10-CM | POA: Diagnosis not present

## 2022-10-17 DIAGNOSIS — I480 Paroxysmal atrial fibrillation: Secondary | ICD-10-CM | POA: Diagnosis not present

## 2022-11-12 ENCOUNTER — Other Ambulatory Visit: Payer: Self-pay

## 2022-11-12 ENCOUNTER — Encounter: Payer: Self-pay | Admitting: General Surgery

## 2022-11-12 ENCOUNTER — Ambulatory Visit (INDEPENDENT_AMBULATORY_CARE_PROVIDER_SITE_OTHER): Payer: 59 | Admitting: General Surgery

## 2022-11-12 VITALS — BP 146/74 | HR 65 | Temp 97.9°F | Resp 22 | Ht 61.0 in | Wt 93.4 lb

## 2022-11-12 DIAGNOSIS — K56609 Unspecified intestinal obstruction, unspecified as to partial versus complete obstruction: Secondary | ICD-10-CM | POA: Diagnosis not present

## 2022-11-12 NOTE — Progress Notes (Signed)
Subjective:     Kendra Lee  Patient here for follow-up hospitalization of a small bowel obstruction.  She did not require surgery.  Since her discharge, she has had no recurrent episodes of abdominal distention, nausea, or vomiting.  She is having bowel movements daily.  She has no abdominal pain. Objective:    BP (!) 146/74   Pulse 65   Temp 97.9 F (36.6 C) (Oral)   Resp (!) 22   Ht 5\' 1"  (1.549 m)   Wt 93 lb 6.4 oz (42.4 kg)   LMP  (LMP Unknown)   SpO2 93%   BMI 17.65 kg/m   General:  alert, cooperative, and no distress  Abdomen soft, flat, nontender, nondistended.  Bowel sounds present.     Assessment:    Small bowel obstruction, resolved    Plan:   No indication for surgery at this time.  Follow-up here as needed.

## 2022-11-18 DIAGNOSIS — I5022 Chronic systolic (congestive) heart failure: Secondary | ICD-10-CM | POA: Diagnosis not present

## 2022-11-18 DIAGNOSIS — I10 Essential (primary) hypertension: Secondary | ICD-10-CM | POA: Diagnosis not present

## 2022-12-19 DIAGNOSIS — I5022 Chronic systolic (congestive) heart failure: Secondary | ICD-10-CM | POA: Diagnosis not present

## 2022-12-19 DIAGNOSIS — I10 Essential (primary) hypertension: Secondary | ICD-10-CM | POA: Diagnosis not present

## 2023-01-18 DIAGNOSIS — I10 Essential (primary) hypertension: Secondary | ICD-10-CM | POA: Diagnosis not present

## 2023-01-18 DIAGNOSIS — I5022 Chronic systolic (congestive) heart failure: Secondary | ICD-10-CM | POA: Diagnosis not present

## 2023-02-17 ENCOUNTER — Other Ambulatory Visit: Payer: Self-pay

## 2023-02-17 ENCOUNTER — Emergency Department (HOSPITAL_COMMUNITY): Payer: 59

## 2023-02-17 ENCOUNTER — Inpatient Hospital Stay (HOSPITAL_COMMUNITY)
Admission: EM | Admit: 2023-02-17 | Discharge: 2023-02-25 | DRG: 481 | Disposition: A | Discharge status: Skilled Nursing Facility | Payer: 59 | Attending: Internal Medicine | Admitting: Internal Medicine

## 2023-02-17 ENCOUNTER — Encounter (HOSPITAL_COMMUNITY): Payer: Self-pay | Admitting: Radiology

## 2023-02-17 DIAGNOSIS — R1312 Dysphagia, oropharyngeal phase: Secondary | ICD-10-CM | POA: Diagnosis present

## 2023-02-17 DIAGNOSIS — E871 Hypo-osmolality and hyponatremia: Secondary | ICD-10-CM

## 2023-02-17 DIAGNOSIS — Z90710 Acquired absence of both cervix and uterus: Secondary | ICD-10-CM | POA: Diagnosis not present

## 2023-02-17 DIAGNOSIS — K5900 Constipation, unspecified: Secondary | ICD-10-CM | POA: Diagnosis not present

## 2023-02-17 DIAGNOSIS — S72001A Fracture of unspecified part of neck of right femur, initial encounter for closed fracture: Secondary | ICD-10-CM | POA: Diagnosis not present

## 2023-02-17 DIAGNOSIS — Z931 Gastrostomy status: Secondary | ICD-10-CM

## 2023-02-17 DIAGNOSIS — M80051A Age-related osteoporosis with current pathological fracture, right femur, initial encounter for fracture: Principal | ICD-10-CM | POA: Diagnosis present

## 2023-02-17 DIAGNOSIS — M47816 Spondylosis without myelopathy or radiculopathy, lumbar region: Secondary | ICD-10-CM | POA: Diagnosis not present

## 2023-02-17 DIAGNOSIS — D72829 Elevated white blood cell count, unspecified: Secondary | ICD-10-CM | POA: Diagnosis present

## 2023-02-17 DIAGNOSIS — M818 Other osteoporosis without current pathological fracture: Secondary | ICD-10-CM | POA: Diagnosis not present

## 2023-02-17 DIAGNOSIS — M85851 Other specified disorders of bone density and structure, right thigh: Secondary | ICD-10-CM | POA: Diagnosis not present

## 2023-02-17 DIAGNOSIS — D62 Acute posthemorrhagic anemia: Secondary | ICD-10-CM | POA: Diagnosis not present

## 2023-02-17 DIAGNOSIS — I69391 Dysphagia following cerebral infarction: Secondary | ICD-10-CM | POA: Diagnosis not present

## 2023-02-17 DIAGNOSIS — F17200 Nicotine dependence, unspecified, uncomplicated: Secondary | ICD-10-CM | POA: Diagnosis not present

## 2023-02-17 DIAGNOSIS — Z7902 Long term (current) use of antithrombotics/antiplatelets: Secondary | ICD-10-CM

## 2023-02-17 DIAGNOSIS — F17210 Nicotine dependence, cigarettes, uncomplicated: Secondary | ICD-10-CM | POA: Diagnosis present

## 2023-02-17 DIAGNOSIS — S72144A Nondisplaced intertrochanteric fracture of right femur, initial encounter for closed fracture: Secondary | ICD-10-CM | POA: Diagnosis not present

## 2023-02-17 DIAGNOSIS — S7291XD Unspecified fracture of right femur, subsequent encounter for closed fracture with routine healing: Secondary | ICD-10-CM | POA: Diagnosis not present

## 2023-02-17 DIAGNOSIS — J441 Chronic obstructive pulmonary disease with (acute) exacerbation: Secondary | ICD-10-CM | POA: Diagnosis not present

## 2023-02-17 DIAGNOSIS — M6281 Muscle weakness (generalized): Secondary | ICD-10-CM | POA: Diagnosis not present

## 2023-02-17 DIAGNOSIS — E440 Moderate protein-calorie malnutrition: Secondary | ICD-10-CM | POA: Diagnosis present

## 2023-02-17 DIAGNOSIS — Z79899 Other long term (current) drug therapy: Secondary | ICD-10-CM | POA: Diagnosis not present

## 2023-02-17 DIAGNOSIS — Z7982 Long term (current) use of aspirin: Secondary | ICD-10-CM | POA: Diagnosis not present

## 2023-02-17 DIAGNOSIS — Z681 Body mass index (BMI) 19 or less, adult: Secondary | ICD-10-CM | POA: Diagnosis not present

## 2023-02-17 DIAGNOSIS — Z7401 Bed confinement status: Secondary | ICD-10-CM | POA: Diagnosis not present

## 2023-02-17 DIAGNOSIS — Z9181 History of falling: Secondary | ICD-10-CM | POA: Diagnosis not present

## 2023-02-17 DIAGNOSIS — I69331 Monoplegia of upper limb following cerebral infarction affecting right dominant side: Secondary | ICD-10-CM | POA: Diagnosis not present

## 2023-02-17 DIAGNOSIS — S72001D Fracture of unspecified part of neck of right femur, subsequent encounter for closed fracture with routine healing: Secondary | ICD-10-CM | POA: Diagnosis not present

## 2023-02-17 DIAGNOSIS — Z7983 Long term (current) use of bisphosphonates: Secondary | ICD-10-CM | POA: Diagnosis not present

## 2023-02-17 DIAGNOSIS — E785 Hyperlipidemia, unspecified: Secondary | ICD-10-CM | POA: Diagnosis present

## 2023-02-17 DIAGNOSIS — S72141A Displaced intertrochanteric fracture of right femur, initial encounter for closed fracture: Secondary | ICD-10-CM | POA: Diagnosis not present

## 2023-02-17 DIAGNOSIS — R0902 Hypoxemia: Secondary | ICD-10-CM | POA: Diagnosis not present

## 2023-02-17 DIAGNOSIS — M79651 Pain in right thigh: Secondary | ICD-10-CM | POA: Diagnosis present

## 2023-02-17 DIAGNOSIS — F17209 Nicotine dependence, unspecified, with unspecified nicotine-induced disorders: Secondary | ICD-10-CM | POA: Diagnosis not present

## 2023-02-17 DIAGNOSIS — E876 Hypokalemia: Secondary | ICD-10-CM | POA: Diagnosis present

## 2023-02-17 DIAGNOSIS — W19XXXA Unspecified fall, initial encounter: Secondary | ICD-10-CM | POA: Diagnosis not present

## 2023-02-17 DIAGNOSIS — E861 Hypovolemia: Secondary | ICD-10-CM | POA: Diagnosis present

## 2023-02-17 DIAGNOSIS — R531 Weakness: Secondary | ICD-10-CM | POA: Diagnosis not present

## 2023-02-17 DIAGNOSIS — I10 Essential (primary) hypertension: Secondary | ICD-10-CM | POA: Diagnosis present

## 2023-02-17 DIAGNOSIS — I878 Other specified disorders of veins: Secondary | ICD-10-CM | POA: Diagnosis not present

## 2023-02-17 DIAGNOSIS — Z8673 Personal history of transient ischemic attack (TIA), and cerebral infarction without residual deficits: Secondary | ICD-10-CM | POA: Diagnosis not present

## 2023-02-17 DIAGNOSIS — D649 Anemia, unspecified: Secondary | ICD-10-CM | POA: Diagnosis not present

## 2023-02-17 LAB — COMPREHENSIVE METABOLIC PANEL
ALT: 24 U/L (ref 0–44)
AST: 22 U/L (ref 15–41)
Albumin: 3.9 g/dL (ref 3.5–5.0)
Alkaline Phosphatase: 46 U/L (ref 38–126)
Anion gap: 11 (ref 5–15)
BUN: 11 mg/dL (ref 8–23)
CO2: 24 mmol/L (ref 22–32)
Calcium: 9.1 mg/dL (ref 8.9–10.3)
Chloride: 99 mmol/L (ref 98–111)
Creatinine, Ser: 0.67 mg/dL (ref 0.44–1.00)
GFR, Estimated: >60 mL/min (ref >60)
Glucose, Bld: 142 mg/dL — ABNORMAL HIGH (ref 70–99)
Potassium: 3.3 mmol/L — ABNORMAL LOW (ref 3.5–5.1)
Sodium: 134 mmol/L — ABNORMAL LOW (ref 135–145)
Total Bilirubin: 0.7 mg/dL (ref <1.2)
Total Protein: 6.7 g/dL (ref 6.5–8.1)

## 2023-02-17 LAB — URINALYSIS, ROUTINE W REFLEX MICROSCOPIC
Bilirubin Urine: NEGATIVE
Glucose, UA: NEGATIVE mg/dL
Hgb urine dipstick: NEGATIVE
Ketones, ur: 20 mg/dL — AB
Leukocytes,Ua: NEGATIVE
Nitrite: NEGATIVE
Protein, ur: NEGATIVE mg/dL
Specific Gravity, Urine: 1.014 (ref 1.005–1.030)
pH: 6 (ref 5.0–8.0)

## 2023-02-17 LAB — CBC WITH DIFFERENTIAL/PLATELET
Abs Immature Granulocytes: 0.07 10*3/uL (ref 0.00–0.07)
Basophils Absolute: 0 10*3/uL (ref 0.0–0.1)
Basophils Relative: 0 %
Eosinophils Absolute: 0 10*3/uL (ref 0.0–0.5)
Eosinophils Relative: 0 %
HCT: 36.4 % (ref 36.0–46.0)
Hemoglobin: 12.4 g/dL (ref 12.0–15.0)
Immature Granulocytes: 0 %
Lymphocytes Relative: 4 %
Lymphs Abs: 0.7 10*3/uL (ref 0.7–4.0)
MCH: 32.1 pg (ref 26.0–34.0)
MCHC: 34.1 g/dL (ref 30.0–36.0)
MCV: 94.3 fL (ref 80.0–100.0)
Monocytes Absolute: 0.5 10*3/uL (ref 0.1–1.0)
Monocytes Relative: 3 %
Neutro Abs: 15 10*3/uL — ABNORMAL HIGH (ref 1.7–7.7)
Neutrophils Relative %: 93 %
Platelets: 306 10*3/uL (ref 150–400)
RBC: 3.86 MIL/uL — ABNORMAL LOW (ref 3.87–5.11)
RDW: 13.2 % (ref 11.5–15.5)
WBC: 16.4 10*3/uL — ABNORMAL HIGH (ref 4.0–10.5)
nRBC: 0 % (ref 0.0–0.2)

## 2023-02-17 LAB — CK: Total CK: 112 U/L (ref 38–234)

## 2023-02-17 MED ORDER — FENTANYL CITRATE PF 50 MCG/ML IJ SOSY
50.0000 ug | PREFILLED_SYRINGE | Freq: Once | INTRAMUSCULAR | Status: AC
  Administered 2023-02-17: 50 ug via INTRAVENOUS
  Filled 2023-02-17: qty 1

## 2023-02-17 MED ORDER — TRAZODONE HCL 50 MG PO TABS
25.0000 mg | ORAL_TABLET | Freq: Every evening | ORAL | Status: DC | PRN
  Administered 2023-02-19 – 2023-02-20 (×2): 25 mg via ORAL
  Filled 2023-02-17 (×3): qty 1

## 2023-02-17 MED ORDER — MIRTAZAPINE 15 MG PO TABS
7.5000 mg | ORAL_TABLET | Freq: Every day | ORAL | Status: DC
  Administered 2023-02-17 – 2023-02-24 (×7): 7.5 mg via ORAL
  Filled 2023-02-17 (×7): qty 1

## 2023-02-17 MED ORDER — LOSARTAN POTASSIUM 50 MG PO TABS
100.0000 mg | ORAL_TABLET | Freq: Every day | ORAL | Status: DC
  Administered 2023-02-19 – 2023-02-24 (×6): 100 mg via ORAL
  Filled 2023-02-17 (×6): qty 2

## 2023-02-17 MED ORDER — MAGNESIUM HYDROXIDE 400 MG/5ML PO SUSP: 30.0000 mL | Freq: Every day | ORAL | Status: DC | PRN

## 2023-02-17 MED ORDER — HYDROMORPHONE HCL 1 MG/ML IJ SOLN
0.5000 mg | Freq: Once | INTRAMUSCULAR | Status: AC
  Administered 2023-02-17: 0.5 mg via INTRAVENOUS
  Filled 2023-02-17: qty 0.5

## 2023-02-17 MED ORDER — POTASSIUM CHLORIDE IN NACL 20-0.9 MEQ/L-% IV SOLN
INTRAVENOUS | Status: AC
Start: 2023-02-17 — End: 2023-02-18
  Filled 2023-02-17 (×2): qty 1000

## 2023-02-17 MED ORDER — MORPHINE SULFATE (PF) 2 MG/ML IV SOLN
0.5000 mg | INTRAVENOUS | Status: DC | PRN
  Administered 2023-02-18 – 2023-02-19 (×6): 0.5 mg via INTRAVENOUS
  Filled 2023-02-17 (×6): qty 1

## 2023-02-17 MED ORDER — ATORVASTATIN CALCIUM 80 MG PO TABS
80.0000 mg | ORAL_TABLET | Freq: Every day | ORAL | Status: DC
  Administered 2023-02-19 – 2023-02-25 (×7): 80 mg via ORAL
  Filled 2023-02-17 (×7): qty 1

## 2023-02-17 MED ORDER — DIPHENHYDRAMINE HCL 25 MG PO CAPS: 25.0000 mg | ORAL_CAPSULE | Freq: Four times a day (QID) | ORAL | Status: DC | PRN

## 2023-02-17 MED ORDER — HYDROCODONE-ACETAMINOPHEN 5-325 MG PO TABS
1.0000 | ORAL_TABLET | Freq: Four times a day (QID) | ORAL | Status: DC | PRN
  Administered 2023-02-17: 2 via ORAL
  Administered 2023-02-18: 1 via ORAL
  Administered 2023-02-19 – 2023-02-23 (×4): 2 via ORAL
  Administered 2023-02-24: 1 via ORAL
  Administered 2023-02-24: 2 via ORAL
  Administered 2023-02-25 (×2): 1 via ORAL
  Filled 2023-02-17 (×2): qty 2
  Filled 2023-02-17 (×2): qty 1
  Filled 2023-02-17 (×6): qty 2
  Filled 2023-02-17: qty 1

## 2023-02-17 MED ORDER — LOSARTAN POTASSIUM-HCTZ 100-12.5 MG PO TABS: 0.5000 | ORAL_TABLET | Freq: Every day | ORAL | Status: DC

## 2023-02-17 MED ORDER — ACETAMINOPHEN 325 MG PO TABS: 650.0000 mg | ORAL_TABLET | ORAL | Status: DC | PRN

## 2023-02-17 MED ORDER — ONDANSETRON HCL 4 MG/2ML IJ SOLN: 4.0000 mg | INTRAMUSCULAR | Status: DC | PRN

## 2023-02-17 MED ORDER — AMLODIPINE BESYLATE 5 MG PO TABS
5.0000 mg | ORAL_TABLET | Freq: Every day | ORAL | Status: DC
  Filled 2023-02-17: qty 1

## 2023-02-17 MED ORDER — THIAMINE MONONITRATE 100 MG PO TABS
100.0000 mg | ORAL_TABLET | Freq: Every day | ORAL | Status: DC
  Administered 2023-02-19 – 2023-02-25 (×7): 100 mg via ORAL
  Filled 2023-02-17 (×10): qty 1

## 2023-02-17 MED ORDER — HYDROCHLOROTHIAZIDE 12.5 MG PO TABS: 12.5000 mg | ORAL_TABLET | Freq: Every day | ORAL | Status: DC

## 2023-02-17 MED ORDER — POTASSIUM CHLORIDE CRYS ER 20 MEQ PO TBCR
20.0000 meq | EXTENDED_RELEASE_TABLET | Freq: Every day | ORAL | Status: DC
  Administered 2023-02-19 – 2023-02-22 (×4): 20 meq via ORAL
  Filled 2023-02-17 (×4): qty 1

## 2023-02-17 MED ORDER — SODIUM CHLORIDE 0.9 % IV BOLUS
1000.0000 mL | Freq: Once | INTRAVENOUS | Status: AC
  Administered 2023-02-17: 1000 mL via INTRAVENOUS

## 2023-02-17 MED ORDER — ALENDRONATE SODIUM 70 MG PO TABS: 70.0000 mg | ORAL_TABLET | ORAL | Status: DC

## 2023-02-17 NOTE — ED Triage Notes (Addendum)
Pt states she was putting wood in her heater and fell. Pt states she laid in the floor from 11:30am-6:30pm. Pt endorses right hip to knee pain. Denies hitting her head but does endorse being on Plavix. Pt states no dizziness she just lost her balance.

## 2023-02-17 NOTE — H&P (Signed)
[place]   PATIENT NAME: Kendra Lee    MR#:  [id number]  DATE OF BIRTH:  1955-08-21  DATE OF ADMISSION:  02/17/2023  PRIMARY CARE PHYSICIAN: Benetta Spar, MD   Patient is coming from: Home  REQUESTING/REFERRING PHYSICIAN: Lonell Grandchild, MD   CHIEF COMPLAINT:   Chief Complaint  Patient presents with   Fall    HISTORY OF PRESENT ILLNESS:  Kendra Lee is a 67 y.o. Caucasian female with medical history significant for CVA, hypertension, and ongoing tobacco abuse, who presented to the emergency room with acute onset of right hip pain.  The patient was working on her fireplace and while bending lost her balance and fell on her right side with subsequent right hip pain and inability to ambulate.  She denies any presyncope or syncope.  No chest pain or palpitations.  No paresthesias or focal muscle weakness.  She did not hit her head.  No other injuries.  No worsening cough or wheezing or dyspnea.  No dysuria, oliguria or hematuria or flank pain.  ED Course: When the patient came to the ER, BP was 150/83 with otherwise normal vital signs.  Labs revealed hypokalemia of 3.3 with sodium 134 glucose of 142 with otherwise unremarkable CMP.  CBC showed leukocytosis of 16.4 with neutrophilia and UA showed 20 ketones and was otherwise unremarkable. EKG as reviewed by me : Normal sinus rhythm with rate of 77 with T wave inversion anterolaterally and prolonged QT interval with QTc of 548 MS. Imaging: Right hip and pelvic x-ray revealed nondisplaced right femoral intertrochanteric fracture.  The patient was given 0.5 mg of IV Dilaudid and 1 L bolus of IV normal saline.  She will be admitted to a medical bed for further evaluation and management.Marland Kitchen PAST MEDICAL HISTORY:   Past Medical History:  Diagnosis Date   CVA (cerebral vascular accident) (HCC)    Dysphagia    Hypertension    Hypokalemia    Smoker    2 ppd x 55 years    PAST SURGICAL HISTORY:   Past  Surgical History:  Procedure Laterality Date   ABDOMINAL HYSTERECTOMY     late 1980s   IR GASTROSTOMY TUBE MOD SED  09/22/2020   IR GASTROSTOMY TUBE MOD SED  10/04/2020    SOCIAL HISTORY:   Social History   Tobacco Use   Smoking status: Every Day    Current packs/day: 1.00    Types: Cigarettes   Smokeless tobacco: Never  Substance Use Topics   Alcohol use: Never    FAMILY HISTORY:  Positive for CVA in her son.  DRUG ALLERGIES:  No Known Allergies  REVIEW OF SYSTEMS:   ROS As per history of present illness. All pertinent systems were reviewed above. Constitutional, HEENT, cardiovascular, respiratory, GI, GU, musculoskeletal, neuro, psychiatric, endocrine, integumentary and hematologic systems were reviewed and are otherwise negative/unremarkable except for positive findings mentioned above in the HPI.   MEDICATIONS AT HOME:   Prior to Admission medications   Medication Sig Start Date End Date Taking? Authorizing Provider  acetaminophen (TYLENOL) 500 MG tablet Take 500 mg by mouth every 6 (six) hours as needed for mild pain (pain score 1-3).   Yes [provider]  acetaminophen (TYLENOL) 650 MG CR tablet Take 1,300 mg by mouth every 8 (eight) hours as needed for pain.   Yes [provider]  alendronate (FOSAMAX) 70 MG tablet Take 70 mg by mouth once a week. 05/02/22  Yes [provider]  amLODipine (NORVASC) 5 MG tablet Take 5 mg by mouth daily. 11/19/22  Yes [provider]  aspirin 81 MG chewable tablet Chew 1 tablet (81 mg total) by mouth daily. 10/09/22  Yes Johnson, Clanford L, MD  atorvastatin (LIPITOR) 80 MG tablet Take 80 mg by mouth daily. 12/19/22  Yes [provider]  clopidogrel (PLAVIX) 75 MG tablet Take 75 mg by mouth daily. 12/25/22  Yes [provider]  diphenhydrAMINE (BENADRYL) 25 mg capsule Take 25 mg by mouth every 6 (six) hours as needed for allergies.   Yes [provider]  docusate sodium  (COLACE) 100 MG capsule Take 100 mg by mouth 2 (two) times daily.   Yes [provider]  losartan-hydrochlorothiazide (HYZAAR) 100-12.5 MG tablet Take 0.5 tablets by mouth daily. 04/15/22  Yes [provider]  mirtazapine (REMERON) 7.5 MG tablet Take 7.5 mg by mouth at bedtime. 06/09/22  Yes [provider]  potassium chloride SA (KLOR-CON M) 20 MEQ tablet Take 20 mEq by mouth daily. 05/27/22  Yes [provider]  thiamine (VITAMIN B1) 100 MG tablet Take 1 tablet (100 mg total) by mouth daily. 10/09/22  Yes Johnson, Clanford L, MD      VITAL SIGNS:  Blood pressure (!) 195/86, pulse 75, temperature 98.8 F (37.1 C), temperature source Oral, resp. rate 16, height 5\' 1"  (1.549 m), weight 42.2 kg, SpO2 99%.  PHYSICAL EXAMINATION:  Physical Exam  GENERAL:  67 y.o.-year-old Caucasian female patient lying in the bed with no acute distress.  EYES: Pupils equal, round, reactive to light and accommodation. No scleral icterus. Extraocular muscles intact.  HEENT: Head atraumatic, normocephalic. Oropharynx and nasopharynx clear.  NECK:  Supple, no jugular venous distention. No thyroid enlargement, no tenderness.  LUNGS: Normal breath sounds bilaterally, no wheezing, rales,rhonchi or crepitation. No use of accessory muscles of respiration.  CARDIOVASCULAR: Regular rate and rhythm, S1, S2 normal. No murmurs, rubs, or gallops.  ABDOMEN: Soft, nondistended, nontender. Bowel sounds present. No organomegaly or mass.  EXTREMITIES: No pedal edema, cyanosis, or clubbing.  NEUROLOGIC: Cranial nerves II through XII are intact. Muscle strength 5/5 in all extremities. Sensation intact. Gait not checked.  PSYCHIATRIC: The patient is alert and oriented x 3.  Normal affect and good eye contact. SKIN: No obvious rash, lesion, or ulcer.  Musculoskeletal: Right lateral hip tenderness with severe pain on any range of motion.Marland Kitchen LABORATORY PANEL:   CBC Recent Labs  Lab 02/17/23 2028  WBC  16.4*  HGB 12.4  HCT 36.4  PLT 306   ------------------------------------------------------------------------------------------------------------------  Chemistries  Recent Labs  Lab 02/17/23 2028  NA 134*  K 3.3*  CL 99  CO2 24  GLUCOSE 142*  BUN 11  CREATININE 0.67  CALCIUM 9.1  AST 22  ALT 24  ALKPHOS 46  BILITOT 0.7   ------------------------------------------------------------------------------------------------------------------  Cardiac Enzymes No results for input(s): "TROPONINI" in the last 168 hours. ------------------------------------------------------------------------------------------------------------------  RADIOLOGY:  DG Pelvis Portable  Result Date: 02/17/2023 CLINICAL DATA:  Check for pelvic fracture after fall EXAM: PORTABLE PELVIS 1-2 VIEWS COMPARISON:  Femur radiographs earlier today FINDINGS: Redemonstrated right intertrochanteric femur fracture. No pelvic fracture. No diastasis. Degenerative changes pubic symphysis, both hips, SI joints and lower lumbar spine. IMPRESSION: Redemonstrated right intertrochanteric fracture. No additional fractures. Electronically Signed   By: Minerva Fester M.D.   On: 02/17/2023 22:15   DG FEMUR, MIN 2 VIEWS RIGHT  Result Date: 02/17/2023 CLINICAL DATA:  Fall EXAM: RIGHT FEMUR 2 VIEWS COMPARISON:  Right  hip x-ray 04/16/2005. FINDINGS: The bones are osteopenic. There is a nondisplaced right femoral intratrochanteric fracture. There is no dislocation. Peripheral vascular calcifications are present. IMPRESSION: Nondisplaced right femoral intratrochanteric fracture. Electronically Signed   By: Darliss Cheney M.D.   On: 02/17/2023 21:20      IMPRESSION AND PLAN:  Assessment and Plan: * Closed right hip fracture [facility]) - The patient will be admitted to a medical-surgical bed. - Pain management will be provided. - We will hold off Plavix. - Orthopedic consult to be obtained. - Dr. Carola Frost was notified about the patient and  recommended transfer to [facility] to see her there. - The patient has a history of CVA with no previous history of CHF, coronary artery disease, renal failure with a creatinine more than 2 or diabetes mellitus on insulin.  She is considered above average risk for her age for perioperative cardiovascular events per the revised cardiac risk index based on her CVA history.  She has no current active pulmonary issues. - Will be kept n.p.o. after midnight.  Hypokalemia - Potassium will be replaced.  Hyponatremia - Will hydrate with IV normal saline with added potassium chloride and follow BMP.  Essential hypertension - We will continue antihypertensive therapy.  Dyslipidemia - We will continue statin therapy.   DVT prophylaxis: SCDs. Advanced Care Planning:  Code Status: full code. Family Communication:  The plan of care was discussed in details with the patient (and family). I answered all questions. The patient agreed to proceed with the above mentioned plan. Further management will depend upon hospital course. Disposition Plan: Back to previous home environment Consults called: Orthopedic consult. All the records are reviewed and case discussed with ED provider.  Status is: Inpatient  At the time of the admission, it appears that the appropriate admission status for this patient is inpatient.  This is judged to be reasonable and necessary in order to provide the required intensity of service to ensure the patient's safety given the presenting symptoms, physical exam findings and initial radiographic and laboratory data in the context of comorbid conditions.  The patient requires inpatient status due to high intensity of service, high risk of further deterioration and high frequency of surveillance required.  I certify that at the time of admission, it is my clinical judgment that the patient will require inpatient hospital care extending more than 2 midnights.                             Dispo: The patient is from: Home              Anticipated d/c is to: Home              Patient currently is not medically stable to d/c.              Difficult to place patient: No  Hannah Beat M.D on 02/18/2023 at 12:37 AM  [facility]   From 7 PM-7 AM, contact night-coverage [email or web address]  CC: Primary care physician; Benetta Spar, MD

## 2023-02-17 NOTE — ED Notes (Signed)
ED TO INPATIENT HANDOFF REPORT  ED Nurse Name and Phone #: Jess F   S Name/Age/Gender Kendra Lee 67 y.o. female Room/Bed: APA11/APA11  Code Status   Code Status: Prior  Home/SNF/Other Home Patient oriented to: self, place, time, and situation Is this baseline? Yes   Triage Complete: Triage complete  Chief Complaint Closed right hip fracture (HCC) [S72.001A]  Triage Note Pt states she was putting wood in her heater and fell. Pt states she laid in the floor from 11:30am-6:30pm. Pt endorses right hip to knee pain. Denies hitting her head but does endorse being on Plavix. Pt states no dizziness she just lost her balance.   Allergies No Known Allergies  Level of Care/Admitting Diagnosis ED Disposition     ED Disposition  Admit   Condition  --   Comment  Hospital Area: [facility] [[id number]]  Level of Care: Med-Surg [16]  May admit patient to [facility] or [facility] if equivalent level of care is available:: No  Covid Evaluation: Asymptomatic - no recent exposure (last 10 days) testing not required  Diagnosis: Closed right hip fracture [facility]) [[id number]]  Admitting Physician: Hannah Beat [[id number]]  Attending Physician: Hannah Beat [[id number]]  Certification:: I certify this patient will need inpatient services for at least 2 midnights  Expected Medical Readiness: 02/20/2023          B Medical/Surgery History Past Medical History:  Diagnosis Date   CVA (cerebral vascular accident) (HCC)    Dysphagia    Hypertension    Hypokalemia    Smoker    2 ppd x 55 years   Past Surgical History:  Procedure Laterality Date   ABDOMINAL HYSTERECTOMY     late 1980s   IR GASTROSTOMY TUBE MOD SED  09/22/2020   IR GASTROSTOMY TUBE MOD SED  10/04/2020     A IV Location/Drains/Wounds Patient Lines/Drains/Airways Status     Active Line/Drains/Airways     Name Placement date Placement time Site Days   Peripheral IV 10/07/22 Posterior;Right Forearm  10/07/22  1154  Forearm  133   Peripheral IV 02/17/23 20 G Left Antecubital 02/17/23  2028  Antecubital  less than 1   Gastrostomy/Enterostomy Gastrostomy 20 Fr. LUQ 10/04/20  1048  LUQ  866   Wound / Incision (Open or Dehisced) 10/04/20 Puncture Abdomen Left;Anterior;Upper G-tube insertion site  10/04/20  1057  Abdomen  866            Intake/Output Last 24 hours  Intake/Output Summary (Last 24 hours) at 02/17/2023 2220 Last data filed at 02/17/2023 2220 Gross per 24 hour  Intake 1000 ml  Output --  Net 1000 ml    Labs/Imaging Results for orders placed or performed during the hospital encounter of 02/17/23 (from the past 48 hour(s))  Comprehensive metabolic panel     Status: Abnormal   Collection Time: 02/17/23  8:28 PM  Result Value Ref Range   Sodium 134 (L) 135 - 145 mmol/L   Potassium 3.3 (L) 3.5 - 5.1 mmol/L   Chloride 99 98 - 111 mmol/L   CO2 24 22 - 32 mmol/L   Glucose, Bld 142 (H) 70 - 99 mg/dL    Comment: Glucose reference range applies only to samples taken after fasting for at least 8 hours.   BUN 11 8 - 23 mg/dL   Creatinine, Ser 3.24 0.44 - 1.00 mg/dL   Calcium 9.1 8.9 - 40.1 mg/dL   Total Protein 6.7 6.5 - 8.1 g/dL  Albumin 3.9 3.5 - 5.0 g/dL   AST 22 15 - 41 U/L   ALT 24 0 - 44 U/L   Alkaline Phosphatase 46 38 - 126 U/L   Total Bilirubin 0.7 <1.2 mg/dL   GFR, Estimated >16 >60 mL/min    Comment: (NOTE) Calculated using the CKD-EPI Creatinine Equation (2021)    Anion gap 11 5 - 15    Comment: Performed at [facility], [address] [place], [place] [address]  CBC with Differential     Status: Abnormal   Collection Time: 02/17/23  8:28 PM  Result Value Ref Range   WBC 16.4 (H) 4.0 - 10.5 K/uL   RBC 3.86 (L) 3.87 - 5.11 MIL/uL   Hemoglobin 12.4 12.0 - 15.0 g/dL   HCT 01.0 93.2 - 35.5 %   MCV 94.3 80.0 - 100.0 fL   MCH 32.1 26.0 - 34.0 pg   MCHC 34.1 30.0 - 36.0 g/dL   RDW 73.2 20.2 - 54.2 %   Platelets 306 150 - 400 K/uL   nRBC 0.0 0.0 - 0.2 %    Neutrophils Relative % 93 %   Neutro Abs 15.0 (H) 1.7 - 7.7 K/uL   Lymphocytes Relative 4 %   Lymphs Abs 0.7 0.7 - 4.0 K/uL   Monocytes Relative 3 %   Monocytes Absolute 0.5 0.1 - 1.0 K/uL   Eosinophils Relative 0 %   Eosinophils Absolute 0.0 0.0 - 0.5 K/uL   Basophils Relative 0 %   Basophils Absolute 0.0 0.0 - 0.1 K/uL   Immature Granulocytes 0 %   Abs Immature Granulocytes 0.07 0.00 - 0.07 K/uL    Comment: Performed at [facility], [address] [place], [place] [address]  CK     Status: None   Collection Time: 02/17/23  8:28 PM  Result Value Ref Range   Total CK 112 38 - 234 U/L    Comment: Performed at [facility], [address], [place], [place] [address]  Urinalysis, Routine w reflex microscopic -Urine, Clean Catch     Status: Abnormal   Collection Time: 02/17/23 10:10 PM  Result Value Ref Range   Color, Urine YELLOW YELLOW   APPearance HAZY (A) CLEAR   Specific Gravity, Urine 1.014 1.005 - 1.030   pH 6.0 5.0 - 8.0   Glucose, UA NEGATIVE NEGATIVE mg/dL   Hgb urine dipstick NEGATIVE NEGATIVE   Bilirubin Urine NEGATIVE NEGATIVE   Ketones, ur 20 (A) NEGATIVE mg/dL   Protein, ur NEGATIVE NEGATIVE mg/dL   Nitrite NEGATIVE NEGATIVE   Leukocytes,Ua NEGATIVE NEGATIVE    Comment: Performed at [facility], [address] [place], [place] [address]   DG Pelvis Portable  Result Date: 02/17/2023 CLINICAL DATA:  Check for pelvic fracture after fall EXAM: PORTABLE PELVIS 1-2 VIEWS COMPARISON:  Femur radiographs earlier today FINDINGS: Redemonstrated right intertrochanteric femur fracture. No pelvic fracture. No diastasis. Degenerative changes pubic symphysis, both hips, SI joints and lower lumbar spine. IMPRESSION: Redemonstrated right intertrochanteric fracture. No additional fractures. Electronically Signed   By: Minerva Fester M.D.   On: 02/17/2023 22:15   DG FEMUR, MIN 2 VIEWS RIGHT  Result Date: 02/17/2023 CLINICAL DATA:  Fall EXAM: RIGHT FEMUR 2 VIEWS COMPARISON:   Right hip x-ray 04/16/2005. FINDINGS: The bones are osteopenic. There is a nondisplaced right femoral intratrochanteric fracture. There is no dislocation. Peripheral vascular calcifications are present. IMPRESSION: Nondisplaced right femoral intratrochanteric fracture. Electronically Signed   By: Darliss Cheney M.D.   On: 02/17/2023 21:20  Pending Labs Unresulted Labs (From admission, onward)    None       Vitals/Pain Today's Vitals   02/17/23 1945 02/17/23 1946 02/17/23 2020  BP: (!) 150/83    Pulse: 98    Resp: 16    Temp: 98.4 F (36.9 C)    TempSrc: Oral    SpO2: 93%    Weight:  42.2 kg   Height:  5\' 1"  (1.549 m)   PainSc:   10-Worst pain ever    Isolation Precautions No active isolations  Medications Medications  sodium chloride 0.9 % bolus 1,000 mL (0 mLs Intravenous Stopped 02/17/23 2220)  HYDROmorphone (DILAUDID) injection 0.5 mg (0.5 mg Intravenous Given 02/17/23 2114)    Mobility walks     Focused Assessments Abdominal    R Recommendations: See Admitting Provider Note  Report given to:   Additional Notes: n/a

## 2023-02-17 NOTE — ED Provider Notes (Signed)
[place] EMERGENCY DEPARTMENT AT [facility] Provider Note  CSN: [id number] Arrival date & time: 02/17/23 2778  Chief Complaint(s) Fall  HPI Kendra Lee is a 67 y.o. female history of prior stroke, hypertension presenting to the emergency department after fall.  Patient reports that she fell, landed on the ground.  Denies hitting her head.  Reports pain primarily localized to the right thigh.  Has not been able to ambulate or get up since the fall.  Her son helped her up and brought her into the hospital.  She was on the ground for maybe 7 hours.  Denies any other pain such as chest pain, abdominal pain, headaches, nausea, vomiting, or any other new symptoms.   Past Medical History Past Medical History:  Diagnosis Date   CVA (cerebral vascular accident) (HCC)    Dysphagia    Hypertension    Hypokalemia    Smoker    2 ppd x 55 years   Patient Active Problem List   Diagnosis Date Noted   Closed right hip fracture (HCC) 02/17/2023   SBO (small bowel obstruction) (HCC) 10/04/2022   Gastrostomy in place [facility]) 10/04/2022   Vitamin D deficiency 10/02/2020   Dental caries 10/02/2020   Dysphagia 10/02/2020   Hypokalemia 10/02/2020   Acute metabolic encephalopathy 09/18/2020   CVA (cerebral vascular accident) (HCC) 09/18/2020   Leukocytosis 09/18/2020   Essential hypertension 09/18/2020   Home Medication(s) Prior to Admission medications   Medication Sig Start Date End Date Taking? Authorizing Provider  acetaminophen (TYLENOL) 500 MG tablet Take 500 mg by mouth every 6 (six) hours as needed for mild pain (pain score 1-3).   Yes [provider]  acetaminophen (TYLENOL) 650 MG CR tablet Take 1,300 mg by mouth every 8 (eight) hours as needed for pain.   Yes [provider]  alendronate (FOSAMAX) 70 MG tablet Take 70 mg by mouth once a week. 05/02/22  Yes [provider]  amLODipine (NORVASC) 5 MG tablet Take 5 mg by mouth daily. 11/19/22  Yes  [provider]  aspirin 81 MG chewable tablet Chew 1 tablet (81 mg total) by mouth daily. 10/09/22  Yes Johnson, Clanford L, MD  atorvastatin (LIPITOR) 80 MG tablet Take 80 mg by mouth daily. 12/19/22  Yes [provider]  clopidogrel (PLAVIX) 75 MG tablet Take 75 mg by mouth daily. 12/25/22  Yes [provider]  diphenhydrAMINE (BENADRYL) 25 mg capsule Take 25 mg by mouth every 6 (six) hours as needed for allergies.   Yes [provider]  docusate sodium (COLACE) 100 MG capsule Take 100 mg by mouth 2 (two) times daily.   Yes [provider]  losartan-hydrochlorothiazide (HYZAAR) 100-12.5 MG tablet Take 0.5 tablets by mouth daily. 04/15/22  Yes [provider]  mirtazapine (REMERON) 7.5 MG tablet Take 7.5 mg by mouth at bedtime. 06/09/22  Yes [provider]  potassium chloride SA (KLOR-CON M) 20 MEQ tablet Take 20 mEq by mouth daily. 05/27/22  Yes [provider]  thiamine (VITAMIN B1) 100 MG tablet Take 1 tablet (100 mg total) by mouth daily. 10/09/22  Yes Cleora Fleet, MD  Past Surgical History Past Surgical History:  Procedure Laterality Date   ABDOMINAL HYSTERECTOMY     late 1980s   IR GASTROSTOMY TUBE MOD SED  09/22/2020   IR GASTROSTOMY TUBE MOD SED  10/04/2020   Family History History reviewed. No pertinent family history.  Social History Social History   Tobacco Use   Smoking status: Every Day    Current packs/day: 1.00    Types: Cigarettes   Smokeless tobacco: Never  Vaping Use   Vaping status: Never Used  Substance Use Topics   Alcohol use: Never   Drug use: Never   Allergies Patient has no known allergies.  Review of Systems Review of Systems  All other systems reviewed and are negative.   Physical Exam Vital Signs  I have reviewed the triage vital signs BP  (!) 150/83 (BP Location: Right Arm)   Pulse 98   Temp 98.4 F (36.9 C) (Oral)   Resp 16   Ht 5\' 1"  (1.549 m)   Wt 42.2 kg   LMP  (LMP Unknown)   SpO2 93%   BMI 17.57 kg/m  Physical Exam Vitals and nursing note reviewed.  Constitutional:      General: She is not in acute distress.    Appearance: She is well-developed.  HENT:     Head: Normocephalic and atraumatic.     Mouth/Throat:     Mouth: Mucous membranes are moist.  Eyes:     Pupils: Pupils are equal, round, and reactive to light.  Cardiovascular:     Rate and Rhythm: Normal rate and regular rhythm.     Heart sounds: No murmur heard. Pulmonary:     Effort: Pulmonary effort is normal. No respiratory distress.     Breath sounds: Normal breath sounds.  Abdominal:     General: Abdomen is flat.     Palpations: Abdomen is soft.     Tenderness: There is no abdominal tenderness.  Musculoskeletal:     Comments: Painful range of motion of the right hip with tenderness over the right anterior thigh and hip although range of motion mostly intact.  Otherwise no midline C, T, L-spine tenderness, bilateral upper and left lower extremity atraumatic with no focal tenderness.  Skin:    General: Skin is warm and dry.     Capillary Refill: Capillary refill takes less than 2 seconds. B/l feet  Neurological:     General: No focal deficit present.     Mental Status: She is alert. Mental status is at baseline.  Psychiatric:        Mood and Affect: Mood normal.        Behavior: Behavior normal.     ED Results and Treatments Labs (all labs ordered are listed, but only abnormal results are displayed) Labs Reviewed  COMPREHENSIVE METABOLIC PANEL - Abnormal; Notable for the following components:      Result Value   Sodium 134 (*)    Potassium 3.3 (*)    Glucose, Bld 142 (*)    All other components within normal limits  CBC WITH DIFFERENTIAL/PLATELET - Abnormal; Notable for the following components:   WBC 16.4 (*)    RBC 3.86 (*)     Neutro Abs 15.0 (*)    All other components within normal limits  CK  URINALYSIS, ROUTINE W REFLEX MICROSCOPIC  Radiology DG FEMUR, MIN 2 VIEWS RIGHT  Result Date: 02/17/2023 CLINICAL DATA:  Fall EXAM: RIGHT FEMUR 2 VIEWS COMPARISON:  Right hip x-ray 04/16/2005. FINDINGS: The bones are osteopenic. There is a nondisplaced right femoral intratrochanteric fracture. There is no dislocation. Peripheral vascular calcifications are present. IMPRESSION: Nondisplaced right femoral intratrochanteric fracture. Electronically Signed   By: Darliss Cheney M.D.   On: 02/17/2023 21:20    Pertinent labs & imaging results that were available during my care of the patient were reviewed by me and considered in my medical decision making (see MDM for details).  Medications Ordered in ED Medications  sodium chloride 0.9 % bolus 1,000 mL (1,000 mLs Intravenous New Bag/Given 02/17/23 2052)  HYDROmorphone (DILAUDID) injection 0.5 mg (0.5 mg Intravenous Given 02/17/23 2114)                                                                                                                                     Procedures Procedures  (including critical care time)  Medical Decision Making / ED Course   MDM:  67 year old presenting to the emergency department after fall.  Patient well-appearing, physical examination with painful range of motion around the right hip and tenderness over the right hip and thigh.  X-ray demonstrates what appears to me to be an intertrochanteric fracture.  Pending radiology read.  Will also obtain x-ray pelvis.  Patient with no other clear signs of trauma on exam and denies head strike or head injury.  She reports that it was fall when she was bending over and lost her balance rather than any kind of syncopal event.  Given patient was down will check labs including CK.   Anticipate patient will need to be admitted given hip fracture.  Clinical Course as of 02/17/23 2214  Mon Feb 17, 2023  2158 Discussed with Dr. Carola Frost, he will consult, request patient be admitted to [facility]. [WS]  2212 Discussed with Dr. Arville Care, who will admit the patient.  [WS]    Clinical Course User Index [WS] Lonell Grandchild, MD     Lab Tests: -I ordered, reviewed, and interpreted labs.   The pertinent results include:   Labs Reviewed  COMPREHENSIVE METABOLIC PANEL - Abnormal; Notable for the following components:      Result Value   Sodium 134 (*)    Potassium 3.3 (*)    Glucose, Bld 142 (*)    All other components within normal limits  CBC WITH DIFFERENTIAL/PLATELET - Abnormal; Notable for the following components:   WBC 16.4 (*)    RBC 3.86 (*)    Neutro Abs 15.0 (*)    All other components within normal limits  CK  URINALYSIS, ROUTINE W REFLEX MICROSCOPIC    Notable for mild leukocytosis, likely reactive   Imaging Studies ordered: I ordered imaging studies including XR femur/hip On my interpretation imaging demonstrates fracture  I independently visualized and interpreted imaging.  I agree with the radiologist interpretation   Medicines ordered and prescription drug management: Meds ordered this encounter  Medications   sodium chloride 0.9 % bolus 1,000 mL   HYDROmorphone (DILAUDID) injection 0.5 mg    -I have reviewed the patients home medicines and have made adjustments as needed   Consultations Obtained: I requested consultation with the orthopedic surgeon,  and discussed lab and imaging findings as well as pertinent plan - they recommend: admission   Social Determinants of Health:  Diagnosis or treatment significantly limited by social determinants of health: current smoker   Reevaluation: After the interventions noted above, I reevaluated the patient and found that their symptoms have improved  Co morbidities that complicate the  patient evaluation  Past Medical History:  Diagnosis Date   CVA (cerebral vascular accident) (HCC)    Dysphagia    Hypertension    Hypokalemia    Smoker    2 ppd x 55 years      Dispostion: Disposition decision including need for hospitalization was considered, and patient admitted to the hospital.    Final Clinical Impression(s) / ED Diagnoses Final diagnoses:  Closed fracture of right hip, initial encounter [facility])     This chart was dictated using voice recognition software.  Despite best efforts to proofread,  errors can occur which can change the documentation meaning.    Lonell Grandchild, MD 02/17/23 2214

## 2023-02-17 NOTE — Progress Notes (Signed)
Consult request received for right intertroch hip fracture.  Have discussed with the requesting MD patient's stability, pain control, and ongoing work up. I have reviewed x-rays and developed a provisional plan for xfer to [facility], NPO p MN, surgical repair tom pm.   Full consultation to follow in the am.   Myrene Galas, MD [facility], [facility] [phone]

## 2023-02-18 ENCOUNTER — Encounter (HOSPITAL_COMMUNITY)
Admission: EM | Disposition: A | Discharge status: Skilled Nursing Facility | Payer: Self-pay | Source: Home / Self Care | Attending: Internal Medicine

## 2023-02-18 ENCOUNTER — Inpatient Hospital Stay (HOSPITAL_COMMUNITY): Payer: 59

## 2023-02-18 ENCOUNTER — Encounter (HOSPITAL_COMMUNITY): Payer: Self-pay | Admitting: Family Medicine

## 2023-02-18 ENCOUNTER — Inpatient Hospital Stay (HOSPITAL_COMMUNITY): Payer: 59 | Admitting: Anesthesiology

## 2023-02-18 ENCOUNTER — Other Ambulatory Visit: Payer: Self-pay

## 2023-02-18 DIAGNOSIS — E785 Hyperlipidemia, unspecified: Secondary | ICD-10-CM

## 2023-02-18 DIAGNOSIS — I10 Essential (primary) hypertension: Secondary | ICD-10-CM

## 2023-02-18 DIAGNOSIS — E440 Moderate protein-calorie malnutrition: Secondary | ICD-10-CM | POA: Insufficient documentation

## 2023-02-18 DIAGNOSIS — F17210 Nicotine dependence, cigarettes, uncomplicated: Secondary | ICD-10-CM

## 2023-02-18 DIAGNOSIS — S72141A Displaced intertrochanteric fracture of right femur, initial encounter for closed fracture: Secondary | ICD-10-CM | POA: Diagnosis not present

## 2023-02-18 DIAGNOSIS — E871 Hypo-osmolality and hyponatremia: Secondary | ICD-10-CM

## 2023-02-18 HISTORY — PX: INTRAMEDULLARY (IM) NAIL INTERTROCHANTERIC: SHX5875

## 2023-02-18 LAB — BASIC METABOLIC PANEL
Anion gap: 8 (ref 5–15)
BUN: 7 mg/dL — ABNORMAL LOW (ref 8–23)
CO2: 25 mmol/L (ref 22–32)
Calcium: 8.6 mg/dL — ABNORMAL LOW (ref 8.9–10.3)
Chloride: 105 mmol/L (ref 98–111)
Creatinine, Ser: 0.72 mg/dL (ref 0.44–1.00)
GFR, Estimated: >60 mL/min (ref >60)
Glucose, Bld: 85 mg/dL (ref 70–99)
Potassium: 3.7 mmol/L (ref 3.5–5.1)
Sodium: 138 mmol/L (ref 135–145)

## 2023-02-18 LAB — CBC
HCT: 35.7 % — ABNORMAL LOW (ref 36.0–46.0)
Hemoglobin: 11.8 g/dL — ABNORMAL LOW (ref 12.0–15.0)
MCH: 31.1 pg (ref 26.0–34.0)
MCHC: 33.1 g/dL (ref 30.0–36.0)
MCV: 93.9 fL (ref 80.0–100.0)
Platelets: 266 10*3/uL (ref 150–400)
RBC: 3.8 MIL/uL — ABNORMAL LOW (ref 3.87–5.11)
RDW: 13.2 % (ref 11.5–15.5)
WBC: 9.8 10*3/uL (ref 4.0–10.5)
nRBC: 0 % (ref 0.0–0.2)

## 2023-02-18 LAB — SURGICAL PCR SCREEN
MRSA, PCR: NEGATIVE
Staphylococcus aureus: NEGATIVE

## 2023-02-18 SURGERY — FIXATION, FRACTURE, INTERTROCHANTERIC, WITH INTRAMEDULLARY ROD
Anesthesia: General | Laterality: Right

## 2023-02-18 MED ORDER — LACTATED RINGERS IV SOLN: INTRAVENOUS | Status: DC | PRN

## 2023-02-18 MED ORDER — ACETAMINOPHEN 10 MG/ML IV SOLN
1000.0000 mg | Freq: Four times a day (QID) | INTRAVENOUS | Status: AC
  Administered 2023-02-18 – 2023-02-19 (×3): 1000 mg via INTRAVENOUS
  Filled 2023-02-18 (×3): qty 100

## 2023-02-18 MED ORDER — CLOPIDOGREL BISULFATE 75 MG PO TABS: 75.0000 mg | ORAL_TABLET | Freq: Every day | ORAL | Status: DC

## 2023-02-18 MED ORDER — ACETAMINOPHEN 500 MG PO TABS: 1000.0000 mg | ORAL_TABLET | Freq: Once | ORAL | Status: DC

## 2023-02-18 MED ORDER — CEFAZOLIN SODIUM-DEXTROSE 2-4 GM/100ML-% IV SOLN
2.0000 g | INTRAVENOUS | Status: AC
  Administered 2023-02-18: 2 g via INTRAVENOUS
  Filled 2023-02-18: qty 100

## 2023-02-18 MED ORDER — ONDANSETRON HCL 4 MG/2ML IJ SOLN
INTRAMUSCULAR | Status: DC | PRN
  Administered 2023-02-18: 4 mg via INTRAVENOUS

## 2023-02-18 MED ORDER — DEXAMETHASONE SODIUM PHOSPHATE 10 MG/ML IJ SOLN
INTRAMUSCULAR | Status: DC | PRN
  Administered 2023-02-18: 4 mg via INTRAVENOUS

## 2023-02-18 MED ORDER — HYDROMORPHONE HCL 1 MG/ML IJ SOLN
INTRAMUSCULAR | Status: AC
  Filled 2023-02-18: qty 1

## 2023-02-18 MED ORDER — FENTANYL CITRATE (PF) 250 MCG/5ML IJ SOLN
INTRAMUSCULAR | Status: DC | PRN
  Administered 2023-02-18 (×2): 50 ug via INTRAVENOUS
  Administered 2023-02-18: 100 ug via INTRAVENOUS
  Administered 2023-02-18: 50 ug via INTRAVENOUS

## 2023-02-18 MED ORDER — OXYCODONE HCL 5 MG PO TABS: 5.0000 mg | ORAL_TABLET | Freq: Once | ORAL | Status: DC | PRN

## 2023-02-18 MED ORDER — PHENYLEPHRINE 80 MCG/ML (10ML) SYRINGE FOR IV PUSH (FOR BLOOD PRESSURE SUPPORT)
PREFILLED_SYRINGE | INTRAVENOUS | Status: DC | PRN
  Administered 2023-02-18 (×2): 160 ug via INTRAVENOUS

## 2023-02-18 MED ORDER — ONDANSETRON HCL 4 MG/2ML IJ SOLN: 4.0000 mg | Freq: Once | INTRAMUSCULAR | Status: DC | PRN

## 2023-02-18 MED ORDER — HYDROMORPHONE HCL 1 MG/ML IJ SOLN
INTRAMUSCULAR | Status: AC
  Filled 2023-02-18: qty 0.5

## 2023-02-18 MED ORDER — CHLORHEXIDINE GLUCONATE 0.12 % MT SOLN
15.0000 mL | OROMUCOSAL | Status: AC
  Filled 2023-02-18: qty 15

## 2023-02-18 MED ORDER — FENTANYL CITRATE (PF) 250 MCG/5ML IJ SOLN
INTRAMUSCULAR | Status: AC
  Filled 2023-02-18: qty 5

## 2023-02-18 MED ORDER — ALBUMIN HUMAN 5 % IV SOLN: INTRAVENOUS | Status: DC | PRN

## 2023-02-18 MED ORDER — ROCURONIUM BROMIDE 10 MG/ML (PF) SYRINGE
PREFILLED_SYRINGE | INTRAVENOUS | Status: DC | PRN
  Administered 2023-02-18: 50 mg via INTRAVENOUS

## 2023-02-18 MED ORDER — CHLORHEXIDINE GLUCONATE 0.12 % MT SOLN
OROMUCOSAL | Status: AC
  Administered 2023-02-18: 15 mL via OROMUCOSAL
  Filled 2023-02-18: qty 15

## 2023-02-18 MED ORDER — PROPOFOL 10 MG/ML IV BOLUS
INTRAVENOUS | Status: DC | PRN
  Administered 2023-02-18: 100 mg via INTRAVENOUS

## 2023-02-18 MED ORDER — MIDAZOLAM HCL 2 MG/2ML IJ SOLN
INTRAMUSCULAR | Status: AC
  Filled 2023-02-18: qty 2

## 2023-02-18 MED ORDER — MIDAZOLAM HCL 2 MG/2ML IJ SOLN
INTRAMUSCULAR | Status: DC | PRN
  Administered 2023-02-18: 2 mg via INTRAVENOUS

## 2023-02-18 MED ORDER — SUGAMMADEX SODIUM 200 MG/2ML IV SOLN
INTRAVENOUS | Status: DC | PRN
  Administered 2023-02-18: 200 mg via INTRAVENOUS

## 2023-02-18 MED ORDER — OXYCODONE HCL 5 MG/5ML PO SOLN: 5.0000 mg | Freq: Once | ORAL | Status: DC | PRN

## 2023-02-18 MED ORDER — HYDROMORPHONE HCL 1 MG/ML IJ SOLN
0.2500 mg | INTRAMUSCULAR | Status: DC | PRN
  Administered 2023-02-18: 0.5 mg via INTRAVENOUS

## 2023-02-18 SURGICAL SUPPLY — 47 items
BAG COUNTER SPONGE SURGICOUNT (BAG) ×2 IMPLANT
BIT DRILL CANN LG 4.3MM (BIT) IMPLANT
BNDG COHESIVE 6X5 TAN ST LF (GAUZE/BANDAGES/DRESSINGS) IMPLANT
BRUSH SCRUB EZ PLAIN DRY (MISCELLANEOUS) ×4 IMPLANT
COVER PERINEAL POST (MISCELLANEOUS) ×2 IMPLANT
COVER SURGICAL LIGHT HANDLE (MISCELLANEOUS) ×4 IMPLANT
DRAPE C-ARM 42X72 X-RAY (DRAPES) ×2 IMPLANT
DRAPE C-ARMOR (DRAPES) ×2 IMPLANT
DRAPE HALF SHEET 40X57 (DRAPES) IMPLANT
DRAPE INCISE IOBAN 66X45 STRL (DRAPES) ×2 IMPLANT
DRAPE SURG ORHT 6 SPLT 77X108 (DRAPES) IMPLANT
DRAPE U-SHAPE 47X51 STRL (DRAPES) ×2 IMPLANT
DRESSING MEPILEX FLEX 4X4 (GAUZE/BANDAGES/DRESSINGS) ×2 IMPLANT
DRILL BIT CANN LG 4.3MM (BIT) ×1
DRSG EMULSION OIL 3X3 NADH (GAUZE/BANDAGES/DRESSINGS) ×2 IMPLANT
DRSG MEPILEX FLEX 4X4 (GAUZE/BANDAGES/DRESSINGS) ×1
DRSG MEPILEX POST OP 4X8 (GAUZE/BANDAGES/DRESSINGS) ×2 IMPLANT
ELECT REM PT RETURN 9FT ADLT (ELECTROSURGICAL) ×1
ELECTRODE REM PT RTRN 9FT ADLT (ELECTROSURGICAL) ×2 IMPLANT
GLOVE BIO SURGEON STRL SZ7.5 (GLOVE) ×2 IMPLANT
GLOVE BIO SURGEON STRL SZ8 (GLOVE) ×2 IMPLANT
GLOVE BIOGEL PI IND STRL 7.5 (GLOVE) ×2 IMPLANT
GLOVE BIOGEL PI IND STRL 8 (GLOVE) ×2 IMPLANT
GLOVE SURG ORTHO LTX SZ7.5 (GLOVE) ×4 IMPLANT
GOWN STRL REUS W/ TWL LRG LVL3 (GOWN DISPOSABLE) ×4 IMPLANT
GOWN STRL REUS W/ TWL XL LVL3 (GOWN DISPOSABLE) ×2 IMPLANT
GUIDEPIN VERSANAIL DSP 3.2X444 (ORTHOPEDIC DISPOSABLE SUPPLIES) IMPLANT
HFN LAG SCREW 10.5MM X 85MM (Screw) IMPLANT
KIT BASIN OR (CUSTOM PROCEDURE TRAY) ×2 IMPLANT
KIT TURNOVER KIT B (KITS) ×2 IMPLANT
MANIFOLD NEPTUNE II (INSTRUMENTS) ×2 IMPLANT
NAIL HIP FRACT 130D 11X180 (Screw) IMPLANT
NAIL HIP FRACT 130D 9X180 (Orthopedic Implant) IMPLANT
NS IRRIG 1000ML POUR BTL (IV SOLUTION) ×2 IMPLANT
PACK GENERAL/GYN (CUSTOM PROCEDURE TRAY) ×2 IMPLANT
PAD ARMBOARD 7.5X6 YLW CONV (MISCELLANEOUS) ×4 IMPLANT
SCREW BONE CORTICAL 5.0X30 (Screw) IMPLANT
STAPLER VISISTAT 35W (STAPLE) ×2 IMPLANT
STOCKINETTE IMPERVIOUS LG (DRAPES) IMPLANT
SUT ETHILON 2 0 PSLX (SUTURE) ×2 IMPLANT
SUT VIC AB 0 CT1 27XBRD ANBCTR (SUTURE) ×2 IMPLANT
SUT VIC AB 1 CT1 27XBRD ANBCTR (SUTURE) ×2 IMPLANT
SUT VIC AB 2-0 CT1 TAPERPNT 27 (SUTURE) ×2 IMPLANT
SUT VIC AB CT1 27XBRD ANBCTRL (SUTURE) ×1
TOWEL GREEN STERILE (TOWEL DISPOSABLE) ×4 IMPLANT
TOWEL GREEN STERILE FF (TOWEL DISPOSABLE) ×2 IMPLANT
WATER STERILE IRR 1000ML POUR (IV SOLUTION) ×2 IMPLANT

## 2023-02-18 NOTE — Consult Note (Signed)
Orthopaedic Trauma Service (OTS) Consult   Patient ID: Kendra Lee MRN: [id number] DOB/AGE: [birth year] 69 y.o.   Reason for Consult: Right hip fracture Referring Physician: Alvino Blood, MD   HPI: Kendra Lee is an 67 y.o. female with medical history notable for stroke, hypertension and active nicotine abuse who sustained a ground-level fall yesterday while she was putting wood into her heater.  Patient lost her balance and fell landing on her right side she laid on the ground from about 11:30 AM to 6:30 PM.  She is on Plavix.  She was brought to [facility] where she was found to have a right intertrochanteric hip fracture.  She was admitted to the medical service with orthopedic consultation.  Patient seen and evaluated a complains of pretty severe right hip pain.  Denies any numbness and tingling in her right leg.  Denies any additional injuries.  She reports that she does not use any assistive devices at baseline but does have a walker at home which she had after sustaining a stroke about 3 years ago.  She states that on occasion she does drag her feet on the floor when walking.  She does not have any ankle-foot orthoses.  She does report some right upper extremity weakness as residual from her stroke.  Does not really note whether or not she has had right lower extremity weakness as well.  Patient does live alone but she states that her sons live around her.  She seems to be fairly independent on her own.  Patient's pain is exacerbated with movement and is relieved with rest.  Pain is stayed essentially the same since admission.  She does get relief with pain medications as well.  Pain goes from her right hip down to her right knee.  Denies any pain in her lower leg.  Denies any pain in her contralateral lower extremity as well.    She does continue to smoke daily  Patient is on Fosamax    Past Medical History:  Diagnosis Date   CVA (cerebral vascular  accident) (HCC)    Dysphagia    Hypertension    Hypokalemia    Smoker    2 ppd x 55 years    Past Surgical History:  Procedure Laterality Date   ABDOMINAL HYSTERECTOMY     late 1980s   IR GASTROSTOMY TUBE MOD SED  09/22/2020   IR GASTROSTOMY TUBE MOD SED  10/04/2020    History reviewed. No pertinent family history.  Social History:  reports that she has been smoking cigarettes. She has never used smokeless tobacco. She reports that she does not drink alcohol and does not use drugs.  Allergies: No Known Allergies  Medications: I have reviewed the patient's current medications. Current Meds  Medication Sig   acetaminophen (TYLENOL) 500 MG tablet Take 500 mg by mouth every 6 (six) hours as needed for mild pain (pain score 1-3).   acetaminophen (TYLENOL) 650 MG CR tablet Take 1,300 mg by mouth every 8 (eight) hours as needed for pain.   alendronate (FOSAMAX) 70 MG tablet Take 70 mg by mouth once a week.   amLODipine (NORVASC) 5 MG tablet Take 5 mg by mouth daily.   aspirin 81 MG chewable tablet Chew 1 tablet (81 mg total) by mouth daily.   atorvastatin (LIPITOR) 80 MG tablet Take 80 mg by mouth daily.   clopidogrel (PLAVIX) 75 MG tablet Take 75 mg by mouth daily.  diphenhydrAMINE (BENADRYL) 25 mg capsule Take 25 mg by mouth every 6 (six) hours as needed for allergies.   docusate sodium (COLACE) 100 MG capsule Take 100 mg by mouth 2 (two) times daily.   losartan-hydrochlorothiazide (HYZAAR) 100-12.5 MG tablet Take 0.5 tablets by mouth daily.   mirtazapine (REMERON) 7.5 MG tablet Take 7.5 mg by mouth at bedtime.   potassium chloride SA (KLOR-CON M) 20 MEQ tablet Take 20 mEq by mouth daily.   thiamine (VITAMIN B1) 100 MG tablet Take 1 tablet (100 mg total) by mouth daily.     Results for orders placed or performed during the hospital encounter of 02/17/23 (from the past 48 hour(s))  Comprehensive metabolic panel     Status: Abnormal   Collection Time: 02/17/23  8:28 PM  Result  Value Ref Range   Sodium 134 (L) 135 - 145 mmol/L   Potassium 3.3 (L) 3.5 - 5.1 mmol/L   Chloride 99 98 - 111 mmol/L   CO2 24 22 - 32 mmol/L   Glucose, Bld 142 (H) 70 - 99 mg/dL    Comment: Glucose reference range applies only to samples taken after fasting for at least 8 hours.   BUN 11 8 - 23 mg/dL   Creatinine, Ser 1.61 0.44 - 1.00 mg/dL   Calcium 9.1 8.9 - 09.6 mg/dL   Total Protein 6.7 6.5 - 8.1 g/dL   Albumin 3.9 3.5 - 5.0 g/dL   AST 22 15 - 41 U/L   ALT 24 0 - 44 U/L   Alkaline Phosphatase 46 38 - 126 U/L   Total Bilirubin 0.7 <1.2 mg/dL   GFR, Estimated >04 >54 mL/min    Comment: (NOTE) Calculated using the CKD-EPI Creatinine Equation (2021)    Anion gap 11 5 - 15    Comment: Performed at [facility], [address], [place], [place] [address]  CBC with Differential     Status: Abnormal   Collection Time: 02/17/23  8:28 PM  Result Value Ref Range   WBC 16.4 (H) 4.0 - 10.5 K/uL   RBC 3.86 (L) 3.87 - 5.11 MIL/uL   Hemoglobin 12.4 12.0 - 15.0 g/dL   HCT 91.4 78.2 - 95.6 %   MCV 94.3 80.0 - 100.0 fL   MCH 32.1 26.0 - 34.0 pg   MCHC 34.1 30.0 - 36.0 g/dL   RDW 21.3 08.6 - 57.8 %   Platelets 306 150 - 400 K/uL   nRBC 0.0 0.0 - 0.2 %   Neutrophils Relative % 93 %   Neutro Abs 15.0 (H) 1.7 - 7.7 K/uL   Lymphocytes Relative 4 %   Lymphs Abs 0.7 0.7 - 4.0 K/uL   Monocytes Relative 3 %   Monocytes Absolute 0.5 0.1 - 1.0 K/uL   Eosinophils Relative 0 %   Eosinophils Absolute 0.0 0.0 - 0.5 K/uL   Basophils Relative 0 %   Basophils Absolute 0.0 0.0 - 0.1 K/uL   Immature Granulocytes 0 %   Abs Immature Granulocytes 0.07 0.00 - 0.07 K/uL    Comment: Performed at [facility], [address] [place], [place] [address]  CK     Status: None   Collection Time: 02/17/23  8:28 PM  Result Value Ref Range   Total CK 112 38 - 234 U/L    Comment: Performed at [facility], [address], [place], [place] [address]  Urinalysis, Routine w reflex microscopic -Urine, Clean Catch      Status: Abnormal   Collection Time: 02/17/23 10:10  PM  Result Value Ref Range   Color, Urine YELLOW YELLOW   APPearance HAZY (A) CLEAR   Specific Gravity, Urine 1.014 1.005 - 1.030   pH 6.0 5.0 - 8.0   Glucose, UA NEGATIVE NEGATIVE mg/dL   Hgb urine dipstick NEGATIVE NEGATIVE   Bilirubin Urine NEGATIVE NEGATIVE   Ketones, ur 20 (A) NEGATIVE mg/dL   Protein, ur NEGATIVE NEGATIVE mg/dL   Nitrite NEGATIVE NEGATIVE   Leukocytes,Ua NEGATIVE NEGATIVE    Comment: Performed at [facility], [address], [place], [place] [address]  Surgical pcr screen     Status: None   Collection Time: 02/18/23  1:01 AM   Specimen: Nasal Mucosa; Nasal Swab  Result Value Ref Range   MRSA, PCR NEGATIVE NEGATIVE   Staphylococcus aureus NEGATIVE NEGATIVE    Comment: (NOTE) The Xpert SA Assay (FDA approved for NASAL specimens in patients 53 years of age and older), is one component of a comprehensive surveillance program. It is not intended to diagnose infection nor to guide or monitor treatment. Performed at [facility], [address], [place], [place] [address]   CBC     Status: Abnormal   Collection Time: 02/18/23  6:55 AM  Result Value Ref Range   WBC 9.8 4.0 - 10.5 K/uL   RBC 3.80 (L) 3.87 - 5.11 MIL/uL   Hemoglobin 11.8 (L) 12.0 - 15.0 g/dL   HCT 09.8 (L) 11.9 - 14.7 %   MCV 93.9 80.0 - 100.0 fL   MCH 31.1 26.0 - 34.0 pg   MCHC 33.1 30.0 - 36.0 g/dL   RDW 82.9 56.2 - 13.0 %   Platelets 266 150 - 400 K/uL   nRBC 0.0 0.0 - 0.2 %    Comment: Performed at [facility], [address] [place], [place] [address]  Basic metabolic panel     Status: Abnormal   Collection Time: 02/18/23  6:55 AM  Result Value Ref Range   Sodium 138 135 - 145 mmol/L   Potassium 3.7 3.5 - 5.1 mmol/L   Chloride 105 98 - 111 mmol/L   CO2 25 22 - 32 mmol/L   Glucose, Bld 85 70 - 99 mg/dL    Comment: Glucose reference range applies only to samples taken after fasting for at least 8 hours.   BUN 7 (L) 8 -  23 mg/dL   Creatinine, Ser 4.69 0.44 - 1.00 mg/dL   Calcium 8.6 (L) 8.9 - 10.3 mg/dL   GFR, Estimated >62 >95 mL/min    Comment: (NOTE) Calculated using the CKD-EPI Creatinine Equation (2021)    Anion gap 8 5 - 15    Comment: Performed at [facility], [address] [place], [place] [address]    DG Pelvis Portable  Result Date: 02/17/2023 CLINICAL DATA:  Check for pelvic fracture after fall EXAM: PORTABLE PELVIS 1-2 VIEWS COMPARISON:  Femur radiographs earlier today FINDINGS: Redemonstrated right intertrochanteric femur fracture. No pelvic fracture. No diastasis. Degenerative changes pubic symphysis, both hips, SI joints and lower lumbar spine. IMPRESSION: Redemonstrated right intertrochanteric fracture. No additional fractures. Electronically Signed   By: Minerva Fester M.D.   On: 02/17/2023 22:15   DG FEMUR, MIN 2 VIEWS RIGHT  Result Date: 02/17/2023 CLINICAL DATA:  Fall EXAM: RIGHT FEMUR 2 VIEWS COMPARISON:  Right hip x-ray 04/16/2005. FINDINGS: The bones are osteopenic. There is a nondisplaced right femoral intratrochanteric fracture. There is no dislocation. Peripheral vascular calcifications are present. IMPRESSION: Nondisplaced right femoral intratrochanteric fracture.  Electronically Signed   By: Darliss Cheney M.D.   On: 02/17/2023 21:20    Intake/Output      11/25 0701 11/26 0700 11/26 0701 11/27 0700   P.O.  0   IV Piggyback 1000    Total Intake(mL/kg) 1000 (23.7) 0 (0)   Net +1000 0           Review of Systems  Constitutional:  Negative for chills and fever.  Respiratory:  Negative for shortness of breath.   Cardiovascular:  Negative for chest pain and palpitations.  Gastrointestinal:  Negative for nausea and vomiting.  Musculoskeletal:        Right hip pain  Neurological:  Negative for sensory change.   Blood pressure (!) 196/92, pulse 73, temperature 99.6 F (37.6 C), temperature source Oral, resp. rate 17, height 5\' 1"  (1.549 m), weight 42.2 kg, SpO2  98%. Physical Exam Vitals and nursing note reviewed.  Constitutional:      General: She is not in acute distress.    Appearance: Normal appearance.     Comments: Pleasant 67 year old female, appears older than stated age, moderate sarcopenia  HENT:     Head: Normocephalic.  Eyes:     Extraocular Movements: Extraocular movements intact.  Cardiovascular:     Heart sounds: S1 normal and S2 normal.  Pulmonary:     Effort: Pulmonary effort is normal. No respiratory distress.  Musculoskeletal:     Comments:  Right lower extremity Right leg is shortened and externally rotated at the hip No instability or tenderness noted at her knee Lower leg is unremarkable Extremity is warm No traumatic wounds or lesions noted to the right hip.  Skin appears to be free of abrasions DPN, SPN and TN sensory functions appear to be symmetric to contralateral side EHL and FHL and lesser toe motor functions are grossly intact I do have difficulty getting patient to neutral ankle position bilaterally Right sided ankle motion is somewhat limited by referred pain to her hip and and her not wanting to move it Compartments are soft No DCT    Skin:    General: Skin is warm.     Capillary Refill: Capillary refill takes less than 2 seconds.  Neurological:     Mental Status: She is alert and oriented to person, place, and time.  Psychiatric:        Mood and Affect: Mood normal.        Behavior: Behavior normal. Behavior is cooperative.        Thought Content: Thought content normal.          Gait: Did not evaluate gait as patient has acute hip fracture Coordination and balance: Not evaluated   Assessment/Plan:  67 year old female ground-level fall with fragility fracture of right hip  - fall  -Fragility fracture right hip  OR for intramedullary nailing today  Risks and benefits of surgery reviewed the patient wished to proceed  No restrictions postop  Therapy evaluations postop   Reevaluate  motor function postoperatively and her ankle.  I am suspicious that she has a baseline foot drop related to her stroke that may be the potential cause of her falls/imbalance  - Pain management:  Multimodal  Minimize narcotics  - ABL anemia/Hemodynamics  Monitor, currently stable  - Medical issues   For primary  - DVT/PE prophylaxis:  Resume Plavix postop.  Will add aspirin and SCDs - ID:   Perioperative antibiotics - Metabolic Bone Disease:  Known osteoporosis.  Last bone density scan showed  a forearm T-score of -4.5, right neck was -3.3  Check vitamin D levels  RD consult  - Activity:  Therapy evals postop  - FEN/GI prophylaxis/Foley/Lines:  N.p.o. for now  Zoom diet postoperatively  - Impediments to fracture healing:  Osteoporosis  Continue dependence  - Dispo:  OR today for IM nail right hip  TOC consult    Mearl Latin, PA-C [phone] (C) 02/18/2023, 12:56 PM  [facility] [address] [place] [address] [phone] [address] [phone] (F)    After 5pm and on the weekends please log on to Amion, go to orthopaedics and the look under the [facility] Call for the provider(s) on call. You can also call our office at [phone] and then follow the prompts to be connected to the call team.

## 2023-02-18 NOTE — Progress Notes (Signed)
  Progress Note   Patient: Kendra Lee ZOX:[id number] DOB: May 28, 1955 DOA: 02/17/2023     1 DOS: the patient was seen and examined on 02/18/2023   Brief hospital course Assessment and Plan:  67 year old female with history of CVA, hypertension, ongoing tobacco abuse comes to the emergency department with acute onset of right hip pain while patient was working on her fireplace bending lost her balance fell on her right side, unable to ambulate.  Denied any loss of consciousness.  Workup in the emergency department patient was found to have potassium of 3.3, sodium 134, elevated white blood cell count of 16,000.  EKG showed normal sinus rhythm.  X-ray of the hip showed nondisplaced right femoral intertrochanteric fracture.  Closed right intertrochanteric fracture -Continue with the pain management as needed -Orthopedic surgery is consulted -Patient has history of CVA, hypertension, continued tobacco use.  No recent cardiac workup was done.  No chest pain with exertion, keeps her at a moderate risk for surgery. -Get EKG 12-lead. -N.p.o.  Hypokalemia -Replaced by mouth.  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  Hypertension -Continue with home regimen.  CVA -Holding Plavix for now, continue with the statin.  Hyperlipidemia -Continue statin.   Subjective: Complaining of pain in the right hip.  The  Physical Exam: Vitals:   02/18/23 0000 02/18/23 0209 02/18/23 0509 02/18/23 0814  BP: (!) 195/86 (!) 169/84 (!) 178/83 (!) 196/92  Pulse: 75 64 66 73  Resp: 16 18 18 17   Temp: 98.8 F (37.1 C) 98.7 F (37.1 C) 98.7 F (37.1 C) 99.6 F (37.6 C)  TempSrc: Oral Oral Oral Oral  SpO2: 99% 99% 96% 98%  Weight:      Height:       GENERAL:  67 y.o.-year-old Caucasian thin built female patient lying in the bed with no acute distress.  EYES: Pupils equal, round, reactive to light and accommodation. No scleral icterus. Extraocular muscles intact.  HEENT: Head  atraumatic, normocephalic. Oropharynx and nasopharynx clear.  NECK:  Supple, no jugular venous distention. No thyroid enlargement, no tenderness.  LUNGS: Normal breath sounds bilaterally, no wheezing, rales,rhonchi or crepitation. No use of accessory muscles of respiration.  CARDIOVASCULAR: Regular rate and rhythm, S1, S2 normal. No murmurs, rubs, or gallops.  ABDOMEN: Soft, nondistended, nontender. Bowel sounds present. No organomegaly or mass.  EXTREMITIES: No pedal edema, cyanosis, or clubbing.  NEUROLOGIC: Cranial nerves II through XII are intact. Muscle strength 5/5 in all extremities. Sensation intact. Gait not checked.  PSYCHIATRIC: The patient is alert and oriented x 3.  Normal affect and good eye contact. SKIN: No obvious rash, lesion, or ulcer.  Musculoskeletal: Right lateral hip tenderness with severe pain on any range of motion.. Data Reviewed:  Family Communication: Patient  Disposition: Status is: Inpatient Remains inpatient appropriate because: Hip fracture  Planned Discharge Destination: Skilled nursing facility  Time spent: 35 minutes  Author: Susa Griffins, MD 02/18/2023 9:25 AM  For on call review [email or web address].

## 2023-02-18 NOTE — Anesthesia Postprocedure Evaluation (Signed)
Anesthesia Post Note  Patient: Kendra Lee  Procedure(s) Performed: INTRAMEDULLARY (IM) NAIL INTERTROCHANTERIC (Right)     Patient location during evaluation: PACU Anesthesia Type: General Level of consciousness: awake and alert Pain management: pain level controlled Vital Signs Assessment: post-procedure vital signs reviewed and stable Respiratory status: spontaneous breathing, nonlabored ventilation, respiratory function stable and patient connected to nasal cannula oxygen Cardiovascular status: blood pressure returned to baseline and stable Postop Assessment: no apparent nausea or vomiting Anesthetic complications: no   No notable events documented.  Last Vitals:  Vitals:   02/18/23 1930 02/18/23 1948  BP: (!) 171/70 (!) 157/83  Pulse: 76 73  Resp: 12 15  Temp: 36.5 C 36.6 C  SpO2: 96% 93%    Last Pain:  Vitals:   02/18/23 2143  TempSrc:   PainSc: Asleep                 Mariann Barter

## 2023-02-18 NOTE — TOC Initial Note (Addendum)
Transition of Care [facility]) - Initial/Assessment Note    Patient Details  Name: Kendra Lee MRN: [id number] Date of Birth: 02/18/1956  [facility] CM/SW Contact:    Epifanio Lesches, RN Phone Number: 02/18/2023, 12:14 PM  Clinical Narrative:                 Presents after a fall. Suffered R hip fx. States resides in a mobile home alone. PTA independent with ADL's. Owns a cane , RW and W/C.  Plan: ortho consult pending, surgery Plavix on hold, ? Plavix washout prior to surgery.  States sister will assist with care once d/c ready if needed.  Agreeable with home health services if needed. Pt without provider preference. Referral made with [facility] and accepted pending MD's orders.  Pt without transportation issues.  TOC team following and will assist with needs....  11/26 1427 OR today for IM nail right hip. Consult noted for SNF placement. TOC team to f/u post surgery.  Expected Discharge Plan: Home w Home Health Services (vs SNF) Barriers to Discharge: Continued Medical Work up   Patient Goals and CMS Choice     Choice offered to / list presented to : Patient      Expected Discharge Plan and Services   Discharge Planning Services: CM Consult   Living arrangements for the past 2 months: Mobile Home                                      Prior Living Arrangements/Services Living arrangements for the past 2 months: Mobile Home Lives with:: Self Patient language and need for interpreter reviewed:: No Do you feel safe going back to the place where you live?: Yes      Need for Family Participation in Patient Care: Yes (Comment) Care giver support system in place?: Yes (comment) Current home services: DME (cane, W/C, RW) Criminal Activity/Legal Involvement Pertinent to Current Situation/Hospitalization: No - Comment as needed  Activities of Daily Living      Permission Sought/Granted                  Emotional Assessment    Attitude/Demeanor/Rapport: Gracious Affect (typically observed): Accepting Orientation: : Oriented to Situation, Oriented to  Time, Oriented to Place, Oriented to Self Alcohol / Substance Use: Not Applicable Psych Involvement: No (comment)  Admission diagnosis:  Closed right hip fracture (HCC) [S72.001A] Closed fracture of right hip, initial encounter (HCC) [S72.001A] Patient Active Problem List   Diagnosis Date Noted   Dyslipidemia 02/18/2023   Hyponatremia 02/18/2023   Closed right hip fracture (HCC) 02/17/2023   SBO (small bowel obstruction) (HCC) 10/04/2022   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 [place] [address] Phone: [phone] Fax: [phone]     Social Determinants of Health (SDOH) Social History: SDOH Screenings   Food  Insecurity: No Food Insecurity (10/04/2022)  Housing: Low Risk  (10/04/2022)  Transportation Needs: No Transportation Needs (10/04/2022)  Utilities: Not At Risk (10/04/2022)  Tobacco Use: High Risk (02/17/2023)   SDOH Interventions:     Readmission Risk Interventions    10/09/2020   11:28 AM 09/20/2020    2:01 PM  Readmission Risk Prevention Plan  Medication Screening  Complete  Transportation Screening Complete Complete  PCP or Specialist Appt within 5-7 Days Complete   Home Care Screening Complete   Medication Review (RN CM) Complete

## 2023-02-18 NOTE — Progress Notes (Signed)
Trauma Event Note   TRN to bedside to complete CAGE AID screening. Pt sleeping, did not disturb. Screening to be completed at another time.    Kendra Lee Kendra Lee  Trauma Response RN  Please call TRN at [phone] for further assistance.

## 2023-02-18 NOTE — Anesthesia Preprocedure Evaluation (Addendum)
Anesthesia Evaluation  Patient identified by MRN, date of birth, ID band Patient awake    Reviewed: Allergy & Precautions, NPO status , Patient's Chart, lab work & pertinent test results  Airway Mallampati: II  TM Distance: >3 FB Neck ROM: Full    Dental  (+) Poor Dentition, Missing, Dental Advisory Given   Pulmonary Current Smoker and Patient abstained from smoking.   breath sounds clear to auscultation       Cardiovascular hypertension (196/92 preop), Pt. on medications  Rhythm:Regular Rate:Normal  Echo 2022  1. Left ventricular ejection fraction, by estimation, is 60 to 65%. The  left ventricle has normal function. The left ventricle has no regional  wall motion abnormalities. There is mild left ventricular hypertrophy.  Left ventricular diastolic parameters  were normal.   2. Right ventricular systolic function is normal. The right ventricular  size is normal. Tricuspid regurgitation signal is inadequate for assessing  PA pressure.   3. The mitral valve is grossly normal. Trivial mitral valve  regurgitation.   4. The aortic valve is tricuspid. Aortic valve regurgitation is not  visualized. Mild aortic valve sclerosis is present, with no evidence of  aortic valve stenosis. Aortic valve mean gradient measures 3.0 mmHg.   5. The inferior vena cava is normal in size with greater than 50%  respiratory variability, suggesting right atrial pressure of 3 mmHg.     Neuro/Psych CVA  negative psych ROS   GI/Hepatic negative GI ROS, Neg liver ROS,,,  Endo/Other  negative endocrine ROS    Renal/GU negative Renal ROS  negative genitourinary   Musculoskeletal R hip fx   Abdominal   Peds  Hematology negative hematology ROS (+) Hb 11.8, plt 266   Anesthesia Other Findings Plavix LD: 11/25  Reproductive/Obstetrics negative OB ROS                             Anesthesia Physical Anesthesia  Plan  ASA: 3  Anesthesia Plan: General   Post-op Pain Management: Tylenol PO (pre-op)*   Induction: Intravenous  PONV Risk Score and Plan: 2 and Ondansetron, Dexamethasone, Midazolam and Treatment may vary due to age or medical condition  Airway Management Planned: Oral ETT and Video Laryngoscope Planned  Additional Equipment: None  Intra-op Plan:   Post-operative Plan: Extubation in OR  Informed Consent: I have reviewed the patients History and Physical, chart, labs and discussed the procedure including the risks, benefits and alternatives for the proposed anesthesia with the patient or authorized representative who has indicated his/her understanding and acceptance.     Dental advisory given  Plan Discussed with: CRNA  Anesthesia Plan Comments: (Has not been off plavix for long enough for spinal)       Anesthesia Quick Evaluation

## 2023-02-18 NOTE — Assessment & Plan Note (Signed)
-   Will hydrate with IV normal saline with added potassium chloride and follow BMP.

## 2023-02-18 NOTE — Assessment & Plan Note (Addendum)
-   The patient will be admitted to a medical-surgical bed. - Pain management will be provided. - We will hold off Plavix. - Orthopedic consult to be obtained. - Dr. Carola Frost was notified about the patient and recommended transfer to [facility] to see her there. - The patient has a history of CVA with no previous history of CHF, coronary artery disease, renal failure with a creatinine more than 2 or diabetes mellitus on insulin.  She is considered above average risk for her age for perioperative cardiovascular events per the revised cardiac risk index based on her CVA history.  She has no current active pulmonary issues. - Will be kept n.p.o. after midnight.

## 2023-02-18 NOTE — Transfer of Care (Signed)
Immediate Anesthesia Transfer of Care Note  Patient: Kendra Lee  Procedure(s) Performed: INTRAMEDULLARY (IM) NAIL INTERTROCHANTERIC (Right)  Patient Location: PACU  Anesthesia Type:General  Level of Consciousness: awake and alert   Airway & Oxygen Therapy: Patient Spontanous Breathing and Patient connected to nasal cannula oxygen  Post-op Assessment: Report given to RN and Post -op Vital signs reviewed and stable  Post vital signs: Reviewed and stable  Last Vitals:  Vitals Value Taken Time  BP 184/78 02/18/23 1834  Temp    Pulse 83 02/18/23 1836  Resp 13 02/18/23 1836  SpO2 99 % 02/18/23 1836  Vitals shown include unfiled device data.  Last Pain:  Vitals:   02/18/23 1252  TempSrc:   PainSc: 8       Patients Stated Pain Goal: 0 (02/18/23 0509)  Complications: No notable events documented.

## 2023-02-18 NOTE — Progress Notes (Addendum)
Initial Nutrition Assessment  DOCUMENTATION CODES:   Non-severe (moderate) malnutrition in context of chronic illness, Underweight  INTERVENTION:   -Advance diet when able to dysphagia III for ease of chewing.  -Supplement meals with Boost Plus BID, each carton (237 mL) provides 360 calories, 14 gm protein.  -MVI/Minerals-1 Tab daily due to prolonged suboptimal intakes. Continue thiamine 100 mg daily.   NUTRITION DIAGNOSIS:   Moderate Malnutrition related to chronic illness, poor appetite as evidenced by per patient/family report, moderate fat depletion, mild muscle depletion, moderate muscle depletion.    GOAL:   Patient will meet greater than or equal to 90% of their needs    MONITOR:   PO intake, Labs, Supplement acceptance, Weight trends, Diet advancement, Skin  REASON FOR ASSESSMENT:   Consult Assessment of nutrition requirement/status  ASSESSMENT: 67 y/o female presented with acute onset of right hip pain after a fall at home. PMH: CVA, HTN, ongoing tobacco abuse, dysphagia, hypokalemia, gastrostomy tube.  Patient informs she does not eat very much on a usual basis. She usually consumes at B-egg and pudding with medications, in the evening meal hamburger helper, or BBQ chicken, pork chops, pees, black eyed peas or corn. Takes Boost twice daily. Encouraged patient to eat well to support healing, maintain lean body mass. Per EMR review, no significant weight change identified.  Usual body weight 110# before her stroke which at that time also required a feeding tube for a few months in 2022. Denies any difficulty with swallowing at this time, although needs softer foods due to poor dentition, multi missing teeth per inspection of oral cavity. Per patient report, she has adequate food at home. Prepares own meals.  Medications reviewed and include hydrochlorothiazide, remeron, potassium chloride 20 mEq daily, thiamine 100 mg daily.   Labs reviewed    NUTRITION -  FOCUSED PHYSICAL EXAM:  Flowsheet Row Most Recent Value  Orbital Region No depletion  Upper Arm Region Moderate depletion  Thoracic and Lumbar Region Moderate depletion  Buccal Region No depletion  Temple Region Mild depletion  Clavicle Bone Region Moderate depletion  Clavicle and Acromion Bone Region Moderate depletion  Scapular Bone Region Unable to assess  [patient was not able to reposition]  Dorsal Hand Mild depletion  Patellar Region Moderate depletion  Anterior Thigh Region Moderate depletion  Posterior Calf Region Moderate depletion  Edema (RD Assessment) None  Hair Reviewed  Eyes Reviewed  Mouth Reviewed  Skin Reviewed  Nails Reviewed       Diet Order:   Diet Order             Diet NPO time specified  Diet effective midnight                   EDUCATION NEEDS:   Education needs have been addressed  Skin:  Skin Assessment: Reviewed RN Assessment  Last BM:  02/17/23  Height:   Ht Readings from Last 1 Encounters:  02/17/23 5\' 1"  (1.549 m)    Weight:   Wt Readings from Last 1 Encounters:  02/17/23 42.2 kg    Ideal Body Weight:  50 kg  BMI:  Body mass index is 17.57 kg/m.  Estimated Nutritional Needs:   Kcal:  1500-1750 kcal/day  Protein:  65-75 gm/day  Fluid:  1500-1750 mL/day    Alvino Chapel, RDLD Clinical Dietitian See AMION for contact information

## 2023-02-18 NOTE — Assessment & Plan Note (Signed)
repleted   Serial BMP

## 2023-02-18 NOTE — Assessment & Plan Note (Signed)
-  We will continue statin therapy.

## 2023-02-18 NOTE — Assessment & Plan Note (Signed)
-   We will continue antihypertensive therapy.

## 2023-02-18 NOTE — Anesthesia Procedure Notes (Signed)
Procedure Name: Intubation Date/Time: 02/18/2023 5:00 PM  Performed by: Sandie Ano, CRNAPre-anesthesia Checklist: Patient identified, Emergency Drugs available, Suction available and Patient being monitored Patient Re-evaluated:Patient Re-evaluated prior to induction Oxygen Delivery Method: Circle System Utilized Preoxygenation: Pre-oxygenation with 100% oxygen Induction Type: IV induction Ventilation: Mask ventilation without difficulty Laryngoscope Size: Glidescope and 3 Grade View: Grade I Tube type: Oral Number of attempts: 1 Airway Equipment and Method: Stylet and Oral airway Placement Confirmation: ETT inserted through vocal cords under direct vision, positive ETCO2 and breath sounds checked- equal and bilateral Secured at: 21 cm Tube secured with: Tape Dental Injury: Teeth and Oropharynx as per pre-operative assessment

## 2023-02-19 ENCOUNTER — Inpatient Hospital Stay (HOSPITAL_COMMUNITY): Payer: 59

## 2023-02-19 ENCOUNTER — Encounter (HOSPITAL_COMMUNITY): Payer: Self-pay | Admitting: Orthopedic Surgery

## 2023-02-19 DIAGNOSIS — S72141A Displaced intertrochanteric fracture of right femur, initial encounter for closed fracture: Secondary | ICD-10-CM

## 2023-02-19 LAB — CBC
HCT: 26.3 % — ABNORMAL LOW (ref 36.0–46.0)
Hemoglobin: 8.6 g/dL — ABNORMAL LOW (ref 12.0–15.0)
MCH: 31.6 pg (ref 26.0–34.0)
MCHC: 32.7 g/dL (ref 30.0–36.0)
MCV: 96.7 fL (ref 80.0–100.0)
Platelets: 198 10*3/uL (ref 150–400)
RBC: 2.72 MIL/uL — ABNORMAL LOW (ref 3.87–5.11)
RDW: 13.2 % (ref 11.5–15.5)
WBC: 9.9 10*3/uL (ref 4.0–10.5)
nRBC: 0 % (ref 0.0–0.2)

## 2023-02-19 LAB — BASIC METABOLIC PANEL
Anion gap: 11 (ref 5–15)
BUN: 8 mg/dL (ref 8–23)
CO2: 21 mmol/L — ABNORMAL LOW (ref 22–32)
Calcium: 7.8 mg/dL — ABNORMAL LOW (ref 8.9–10.3)
Chloride: 106 mmol/L (ref 98–111)
Creatinine, Ser: 0.9 mg/dL (ref 0.44–1.00)
GFR, Estimated: >60 mL/min (ref >60)
Glucose, Bld: 75 mg/dL (ref 70–99)
Potassium: 3.8 mmol/L (ref 3.5–5.1)
Sodium: 138 mmol/L (ref 135–145)

## 2023-02-19 LAB — VITAMIN D 25 HYDROXY (VIT D DEFICIENCY, FRACTURES): Vit D, 25-Hydroxy: 58.36 ng/mL (ref 30–100)

## 2023-02-19 MED ORDER — BOOST PLUS PO LIQD
237.0000 mL | Freq: Two times a day (BID) | ORAL | Status: DC
  Administered 2023-02-19 – 2023-02-25 (×12): 237 mL via ORAL
  Filled 2023-02-19 (×14): qty 237

## 2023-02-19 MED ORDER — MENTHOL 3 MG MT LOZG
1.0000 | LOZENGE | OROMUCOSAL | Status: DC | PRN
  Filled 2023-02-19: qty 9

## 2023-02-19 MED ORDER — CEFAZOLIN SODIUM-DEXTROSE 2-4 GM/100ML-% IV SOLN
2.0000 g | Freq: Four times a day (QID) | INTRAVENOUS | Status: AC
Start: 2023-02-19 — End: 2023-02-19
  Administered 2023-02-19 (×2): 2 g via INTRAVENOUS
  Filled 2023-02-19 (×2): qty 100

## 2023-02-19 MED ORDER — PHENOL 1.4 % MT LIQD: 1.0000 | OROMUCOSAL | Status: DC | PRN

## 2023-02-19 MED ORDER — CHLORHEXIDINE GLUCONATE CLOTH 2 % EX PADS
6.0000 | MEDICATED_PAD | Freq: Every day | CUTANEOUS | Status: DC
  Administered 2023-02-20 – 2023-02-24 (×6): 6 via TOPICAL

## 2023-02-19 MED ORDER — TRANEXAMIC ACID-NACL 1000-0.7 MG/100ML-% IV SOLN
1000.0000 mg | Freq: Once | INTRAVENOUS | Status: AC
  Administered 2023-02-19: 1000 mg via INTRAVENOUS
  Filled 2023-02-19: qty 100

## 2023-02-19 MED ORDER — METOCLOPRAMIDE HCL 5 MG PO TABS: 5.0000 mg | ORAL_TABLET | Freq: Three times a day (TID) | ORAL | Status: DC | PRN

## 2023-02-19 MED ORDER — DOCUSATE SODIUM 100 MG PO CAPS
100.0000 mg | ORAL_CAPSULE | Freq: Two times a day (BID) | ORAL | Status: DC
  Administered 2023-02-19 – 2023-02-25 (×13): 100 mg via ORAL
  Filled 2023-02-19 (×13): qty 1

## 2023-02-19 MED ORDER — ADULT MULTIVITAMIN W/MINERALS CH
1.0000 | ORAL_TABLET | Freq: Every day | ORAL | Status: DC
  Administered 2023-02-19 – 2023-02-25 (×7): 1 via ORAL
  Filled 2023-02-19 (×7): qty 1

## 2023-02-19 MED ORDER — AMLODIPINE BESYLATE 10 MG PO TABS
10.0000 mg | ORAL_TABLET | Freq: Every day | ORAL | Status: DC
  Administered 2023-02-19 – 2023-02-24 (×6): 10 mg via ORAL
  Filled 2023-02-19 (×5): qty 1

## 2023-02-19 MED ORDER — MORPHINE SULFATE (PF) 2 MG/ML IV SOLN
2.0000 mg | INTRAVENOUS | Status: DC | PRN
  Administered 2023-02-19 – 2023-02-23 (×8): 2 mg via INTRAVENOUS
  Filled 2023-02-19 (×8): qty 1

## 2023-02-19 MED ORDER — METOCLOPRAMIDE HCL 5 MG/ML IJ SOLN: 5.0000 mg | Freq: Three times a day (TID) | INTRAMUSCULAR | Status: DC | PRN

## 2023-02-19 NOTE — Plan of Care (Signed)
  Problem: Clinical Measurements: Goal: Ability to maintain clinical measurements within normal limits will improve Outcome: Progressing Goal: Will remain free from infection Outcome: Progressing   Problem: Nutrition: Goal: Adequate nutrition will be maintained Outcome: Progressing   Problem: Pain Management: Goal: General experience of comfort will improve Outcome: Progressing   Problem: Safety: Goal: Ability to remain free from injury will improve Outcome: Progressing   Problem: Skin Integrity: Goal: Risk for impaired skin integrity will decrease Outcome: Progressing   Problem: Pain Management: Goal: Pain level will decrease Outcome: Progressing

## 2023-02-19 NOTE — Evaluation (Signed)
Clinical/Bedside Swallow Evaluation Patient Details  Name: Kendra Lee MRN: [id number] Date of Birth: 28-Jan-1956  Today's Date: 02/19/2023 Time: SLP Start Time (ACUTE ONLY): 0900 SLP Stop Time (ACUTE ONLY): 0915 SLP Time Calculation (min) (ACUTE ONLY): 15 min  Past Medical History:  Past Medical History:  Diagnosis Date   CVA (cerebral vascular accident) (HCC)    Dysphagia    Hypertension    Hypokalemia    Smoker    2 ppd x 55 years   Past Surgical History:  Past Surgical History:  Procedure Laterality Date   ABDOMINAL HYSTERECTOMY     late 1980s   INTRAMEDULLARY (IM) NAIL INTERTROCHANTERIC Right 02/18/2023   Procedure: INTRAMEDULLARY (IM) NAIL INTERTROCHANTERIC;  Surgeon: Myrene Galas, MD;  Location: [facility] OR;  Service: Orthopedics;  Laterality: Right;   IR GASTROSTOMY TUBE MOD SED  09/22/2020   IR GASTROSTOMY TUBE MOD SED  10/04/2020   HPI:  Patient is a 67 y.o. female with PMH: CVA (2022; with resultant dysphagia requiring PEG and modified diet) HTN, ongoing tobacco abuse. She presented to the hospital on 02/17/2023 with acute onset of right hip pain after she was bending over to work on her fireplace, lost her balance and fell on her right side. She denied LOC. She underwent IM nail of right hip on 02/19/23.    Assessment / Plan / Recommendation  Clinical Impression  Patient presents with clinical s/s of dysphagia as per this bedside swallow evaluation. First sip of thin liquids (water) resulted in immediate gagging and explosive cough but second sip did not result in any overt s/s aspiration. Swallow initiation was suspected to be delayed. Patient indicated that she gets "strangled every now and again" and that this is since CVA in 2022. She feels that her swallow may have declined some since 2022. SLP is recommending MBS to r/o aspiration and determine least restrictive PO diet. Patient in agreement. SLP Visit Diagnosis: Dysphagia, unspecified (R13.10)    Aspiration  Risk  Mild aspiration risk    Diet Recommendation Dysphagia 3 (Mech soft);Thin liquid    Liquid Administration via: Cup;Straw Medication Administration: Whole meds with puree Supervision: Patient able to self feed Compensations: Slow rate;Small sips/bites Postural Changes: Seated upright at 90 degrees    Other  Recommendations Oral Care Recommendations: Oral care BID    Recommendations for follow up therapy are one component of a multi-disciplinary discharge planning process, led by the attending physician.  Recommendations may be updated based on patient status, additional functional criteria and insurance authorization.  Follow up Recommendations Other (comment) (TBD)      Assistance Recommended at Discharge    Functional Status Assessment Patient has had a recent decline in their functional status and demonstrates the ability to make significant improvements in function in a reasonable and predictable amount of time.  Frequency and Duration            Prognosis    N/A    Swallow Study   General Date of Onset: 02/17/23 HPI: Patient is a 67 y.o. female with PMH: CVA (2022; with resultant dysphagia requiring PEG and modified diet) HTN, ongoing tobacco abuse. She presented to the hospital on 02/17/2023 with acute onset of right hip pain after she was bending over to work on her fireplace, lost her balance and fell on her right side. She denied LOC. She underwent IM nail of right hip on 02/19/23. Type of Study: Bedside Swallow Evaluation Previous Swallow Assessment: BSE, MBS in 2022 following CVA Diet Prior to this  Study: Regular;Thin liquids (Level 0) Temperature Spikes Noted: No Respiratory Status: Room air History of Recent Intubation: Yes Total duration of intubation (days):  (for surgery only) Behavior/Cognition: Alert;Cooperative;Pleasant mood;Lethargic/Drowsy Oral Cavity Assessment: Within Functional Limits Oral Care Completed by SLP: No Oral Cavity - Dentition: Poor  condition;Missing dentition Vision: Functional for self-feeding Self-Feeding Abilities: Able to feed self Patient Positioning: Upright in bed Baseline Vocal Quality: Normal Volitional Cough: Weak Volitional Swallow: Able to elicit    Oral/Motor/Sensory Function Overall Oral Motor/Sensory Function: Within functional limits   Ice Chips     Thin Liquid Thin Liquid: Impaired Presentation: Straw;Self Fed Pharyngeal  Phase Impairments: Cough - Immediate;Suspected delayed Swallow    Nectar Thick     Honey Thick     Puree Puree: Not tested   Solid     Solid: Not tested      Angela Nevin, MA, CCC-SLP Speech Therapy

## 2023-02-19 NOTE — Progress Notes (Signed)
  Progress Note   Patient: Kendra Lee YQM:[id number] DOB: May 01, 1955 DOA: 02/17/2023     2 DOS: the patient was seen and examined on 02/19/2023   Brief hospital course Assessment and Plan:  67 year old female with history of CVA, hypertension, ongoing tobacco abuse comes to the emergency department with acute onset of right hip pain while patient was working on her fireplace bending lost her balance fell on her right side, unable to ambulate.  Denied any loss of consciousness.  Workup in the emergency department patient was found to have potassium of 3.3, sodium 134, elevated white blood cell count of 16,000.  EKG showed normal sinus rhythm.  X-ray of the hip showed nondisplaced right femoral intertrochanteric fracture.  Closed right intertrochanteric fracture -Continue with the pain management as needed -Orthopedic surgery is consulted-s/p intramedullary nailing 11/26 -Bleeding at the surgical site, applied pressure dressing. -Holding Plavix -Physical therapy, Occupational Therapy.  Acute blood loss anemia -Admitted with a hemoglobin of 11.1-trended down to 8.6 -Closely follow-up with CBC -Keep the patient on IV iron  Hypokalemia -Replaced by mouth. 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  Hypertension accelerated -Increase the dose of amlodipine to 10 mg daily  CVA -Holding Plavix for now, continue with the statin.  Hyperlipidemia -Continue statin.   Subjective: Complaining of pain in the right hip.  Bleeding from the Physical Exam: Vitals:   02/18/23 1930 02/18/23 1948 02/19/23 0020 02/19/23 0613  BP: (!) 171/70 (!) 157/83 118/63 138/66  Pulse: 76 73 69 80  Resp: 12 15 16 15   Temp: 97.7 F (36.5 C) 97.8 F (36.6 C) 97.7 F (36.5 C) 98.7 F (37.1 C)  TempSrc:  Axillary Axillary Axillary  SpO2: 96% 93% 92% 99%  Weight:      Height:       GENERAL:  67 y.o.-year-old Caucasian thin built female patient lying in the bed with no  acute distress.  EYES: Pupils equal, round, reactive to light and accommodation. No scleral icterus. Extraocular muscles intact.  HEENT: Head atraumatic, normocephalic. Oropharynx and nasopharynx clear.  NECK:  Supple, no jugular venous distention. No thyroid enlargement, no tenderness.  LUNGS: Normal breath sounds bilaterally, no wheezing, rales,rhonchi or crepitation. No use of accessory muscles of respiration.  CARDIOVASCULAR: Regular rate and rhythm, S1, S2 normal. No murmurs, rubs, or gallops.  ABDOMEN: Soft, nondistended, nontender. Bowel sounds present. No organomegaly or mass.  EXTREMITIES: No pedal edema, cyanosis, or clubbing.  NEUROLOGIC: Cranial nerves II through XII are intact. Muscle strength 5/5 in all extremities. Sensation intact. Gait not checked.  PSYCHIATRIC: The patient is alert and oriented x 3.  Normal affect and good eye contact. SKIN: No obvious rash, lesion, or ulcer.  Musculoskeletal: bleeding at the surgical site  Data Reviewed:  Family Communication: Patient  Disposition: Status is: Inpatient Remains inpatient appropriate because: Hip fracture  Planned Discharge Destination: Skilled nursing facility  Time spent: 35 minutes  Author: Susa Griffins, MD 02/19/2023 8:38 AM  For on call review [email or web address].

## 2023-02-19 NOTE — Procedures (Signed)
Modified Barium Swallow Study  Patient Details  Name: Kendra Lee MRN: [id number] Date of Birth: Jul 27, 1955  Today's Date: 02/19/2023  Modified Barium Swallow completed.  Full report located under Chart Review in the Imaging Section.  History of Present Illness Patient is a 67 y.o. female with PMH: CVA (2022; with resultant dysphagia requiring PEG and modified diet) HTN, ongoing tobacco abuse. She presented to the hospital on 02/17/2023 with acute onset of right hip pain after she was bending over to work on her fireplace, lost her balance and fell on her right side. She denied LOC. She underwent IM nail of right hip on 02/19/23.   Clinical Impression Patient presents with a mild oropharyngeal dysphagia as per this modified barium swallow study. As compared to 2022 MBS images as well as SLP's clinical impression statement at that time, patient's dysphagia does not appear to have signifcantly changed. She continues with decreased lingual ROM and strength, leading to delayed anterior to posterior transit of liquid and solid boluses as well as reduced laryngeal elevation and laryngeal vestibule closure, resulting in penetration of thin liquids above vocal cords (PAS 3) and instances of penetration to the vocal cords (PAS 4, 5) and penetration (PAS 2) with nectar thick liquids. Swallow initiated at level of pyriform sinus with thin and nectar thick liquids but initiated at level of vallecular sinus with puree solids and honey thick liquids. Only trace residuals on lingual surface, palate and base of tongue visualized s/p initial swallows but with eventual full clearance. No instances of aspiration observed. All consistencies transited through PES without difficulty and without residuals or retrograde flow at or below PES. SLP educated patient as to results and recommended she continue with the swallow safety precautions she has already been using at home. No further skilled SLP warranted at this  time. Factors that may increase risk of adverse event in presence of aspiration [employer] 2021): Weak cough;Poor general health and/or compromised immunity  Swallow Evaluation Recommendations Recommendations: PO diet PO Diet Recommendation: Dysphagia 3 (Mechanical soft);Thin liquids (Level 0) Liquid Administration via: Cup;Straw Medication Administration: Whole meds with puree Supervision: Patient able to self-feed Swallowing strategies  : Slow rate;Small bites/sips Postural changes: Position pt fully upright for meals;Stay upright 30-60 min after meals Oral care recommendations: Oral care BID (2x/day);Pt independent with oral care      Angela Nevin, MA, CCC-SLP Speech Therapy

## 2023-02-19 NOTE — Evaluation (Signed)
Occupational Therapy Evaluation Patient Details Name: Kendra Lee MRN: [id number] DOB: 1955/11/13 Today's Date: 02/19/2023   History of Present Illness 67 y.o. Patient lost her balance and fell landing on her right side she laid on the ground from about 11:30 AM to 6:30 PM. She was brought to [facility] where she was found to have a right intertrochanteric hip fracture. Pt is now s/p R intramedullary intertrochanteric nailing and WBAT RLE. Past medical history notable for stroke, hypertension and active nicotine abuse.   Clinical Impression   Pt admitted for above, PTA she lived alone and was ind at baseline. Pt currently very limited by R hip pain, not able to progress to standing due to 10/10 pain getting to EOB on non-operative side. Pt needs Total to setup assist with ADLs, her R hip bandage continued to leak throughout session. RN was aware and assisted with clean-up, per Pt the MD is aware also.  She was able to tolerate small movements of AROM exercises, discussed the benefits of continued exercises of BLEs. Pt would benefit from continued acute skilled OT services to address deficits and help transition to next level of care. Patient would benefit from post acute skilled rehab facility with <3 hours of therapy and 24/7 support       If plan is discharge home, recommend the following: Assistance with cooking/housework;A lot of help with walking and/or transfers;A lot of help with bathing/dressing/bathroom;Two people to help with walking and/or transfers;Help with stairs or ramp for entrance    Functional Status Assessment  Patient has had a recent decline in their functional status and demonstrates the ability to make significant improvements in function in a reasonable and predictable amount of time.  Equipment Recommendations  None recommended by OT (TBD at next level of care)    Recommendations for Other Services       Precautions / Restrictions  Precautions Precautions: Fall Restrictions Weight Bearing Restrictions: Yes RLE Weight Bearing: Weight bearing as tolerated      Mobility Bed Mobility Overal bed mobility: Needs Assistance Bed Mobility: Rolling, Sidelying to Sit, Sit to Supine Rolling: Mod assist, Used rails Sidelying to sit: Mod assist, Used rails, HOB elevated   Sit to supine: Mod assist, Used rails   General bed mobility comments: cues for pt to use bed rails, Mod A to assist with RLE due to poor ability to move it    Transfers                   General transfer comment: deferred 2/2 pain      Balance Overall balance assessment: Needs assistance Sitting-balance support: Feet supported, No upper extremity supported Sitting balance-Leahy Scale: Fair Sitting balance - Comments: sitting EOB                                   ADL either performed or assessed with clinical judgement   ADL Overall ADL's : Needs assistance/impaired Eating/Feeding: Independent;Bed level   Grooming: Set up;Sitting   Upper Body Bathing: Set up;Sitting   Lower Body Bathing: Maximal assistance   Upper Body Dressing : Bed level;Set up   Lower Body Dressing: Total assistance   Toilet Transfer: Total assistance;+2 for physical assistance   Toileting- Clothing Manipulation and Hygiene: Total assistance;Bed level         General ADL Comments: pt not likely to tolerate OOB due to high pain levels with moving EOB,  introduced BLE exercises to promote leg strength for OOB ADLs. Reinforced IS use, pt currently able to acheive     Vision         Perception         Praxis         Pertinent Vitals/Pain Pain Assessment Pain Assessment: 0-10 Pain Score: 10-Worst pain ever Pain Location: R hip, incision site Pain Descriptors / Indicators: Aching, Discomfort, Dull, Grimacing, Guarding, Crying Pain Intervention(s): Limited activity within patient's tolerance, Monitored during session, RN gave  pain meds during session     Extremity/Trunk Assessment Upper Extremity Assessment Upper Extremity Assessment: Generalized weakness   Lower Extremity Assessment Lower Extremity Assessment: Defer to PT evaluation (R hip wrapped by RN due to bandage leaking blood. Per RN this has happened earlier)       Communication Communication Communication: No apparent difficulties   Cognition Arousal: Alert Behavior During Therapy: WFL for tasks assessed/performed Overall Cognitive Status: Within Functional Limits for tasks assessed                                       General Comments  VSS on 2L, dry blood noted on pt's pads and she reports her R hip bandage has been leaking and was recently changed out. With moving to the L side EOB the bandage continued to leak blood onto gown. RN notified and was assisted with gauze application. Per pt report MD is aware of bleeding.    Exercises     Shoulder Instructions      Home Living Family/patient expects to be discharged to:: Private residence Living Arrangements: Alone Available Help at Discharge: Available PRN/intermittently Type of Home: Mobile home Home Access: Stairs to enter [employer] of Steps: 6-7 Entrance Stairs-Rails: Right;Left Home Layout: One level     Bathroom Shower/Tub: Chief Strategy Officer: Standard Bathroom Accessibility: Yes How Accessible: Accessible via walker Home Equipment: Wheelchair - Forensic psychologist (2 wheels);Cane - quad;Grab bars - tub/shower   Additional Comments: son or friend takes her to doctors appointments. Pt hopeful her sister can stay with her at [place]      Prior Functioning/Environment Prior Level of Function : Independent/Modified Independent             Mobility Comments: ind ADLs Comments: ind        OT Problem List: Pain;Impaired balance (sitting and/or standing);Decreased strength;Decreased knowledge of precautions;Other (comment)  (impaired ADLs)      OT Treatment/Interventions: Self-care/ADL training;Therapeutic exercise;Therapeutic activities;Balance training;DME and/or AE instruction;Patient/family education    OT Goals(Current goals can be found in the care plan section) Acute Rehab OT Goals Patient Stated Goal: To get stronger OT Goal Formulation: With patient Time For Goal Achievement: 03/05/23 Potential to Achieve Goals: Fair ADL Goals Pt Will Perform Lower Body Bathing: sitting/lateral leans;with min assist Pt Will Perform Lower Body Dressing: sit to/from stand;with min assist Pt Will Transfer to Toilet: stand pivot transfer;with mod assist;bedside commode Pt/caregiver will Perform Home Exercise Program: Increased strength;Both right and left upper extremity;With theraband;With Supervision;With written HEP provided Additional ADL Goal #1: Pt will demonstrate improved use of IS by obtaining of volume to indicated improved cardiopulmanary system  OT Frequency: Min 1X/week    Co-evaluation              AM-PAC OT "6 Clicks" Daily Activity     Outcome Measure Help from another person eating  meals?: None Help from another person taking care of personal grooming?: A Little Help from another person toileting, which includes using toliet, bedpan, or urinal?: Total Help from another person bathing (including washing, rinsing, drying)?: A Lot Help from another person to put on and taking off regular upper body clothing?: A Little Help from another person to put on and taking off regular lower body clothing?: Total 6 Click Score: 14   End of Session Nurse Communication: Mobility status (limited by pain)  Activity Tolerance: Patient limited by pain Patient left: in bed;with call bell/phone within reach;with bed alarm set  OT Visit Diagnosis: Unsteadiness on feet (R26.81);Other abnormalities of gait and mobility (R26.89);History of falling (Z91.81);Muscle weakness (generalized) (M62.81);Pain Pain -  Right/Left: Right Pain - part of body: Hip                Time: 1133-1210 OT Time Calculation (min): 37 min Charges:  OT General Charges $OT Visit: 1 Visit OT Evaluation $OT Eval Moderate Complexity: 1 Mod OT Treatments $Therapeutic Activity: 8-22 mins  02/19/2023  AB, OTR/L  Acute Rehabilitation Services  Office: [phone]   Tristan Schroeder 02/19/2023, 1:05 PM

## 2023-02-19 NOTE — Care Management Important Message (Signed)
Important Message  Patient Details  Name: Kendra Lee MRN: [id number] Date of Birth: 07/22/1955   Important Message Given:  Yes - Medicare IM     Sherilyn Banker 02/19/2023, 1:55 PM

## 2023-02-19 NOTE — Evaluation (Signed)
Physical Therapy Evaluation Patient Details Name: Kendra Lee MRN: [id number] DOB: 1955/05/22 Today's Date: 02/19/2023  History of Present Illness  67 yo female [facility] on [facility] s/p fall, sustained R nondisplaced femoral intertrochanteric fx. S/p R hip IMN 11/26. PMH includes CVA, HTN, tobacco abuse.  Clinical Impression   Pt presents with severe R hip pain, debility, impaired balance with history of falls, max difficulty mobilizing, and decreased activity tolerance given pain. Pt to benefit from acute PT to address deficits. Pt requiring mod-max assist for bed mobility tasks of rolling and repositioning, pt vocalizing in pain throughout and declines EOB or OOB attempts this date. Pt tolerated gentle AAROM exercises RLE with increased time, encouraged pt to continue to do quad sets and ankle pumps. Of note, blood noted from dressing (appears coagulated, no active bleeding noted), PT assisted pt with changing linens and RN notified. Patient will benefit from continued inpatient follow up therapy, <3 hours/day. PT to progress mobility as tolerated, and will continue to follow acutely.          If plan is discharge home, recommend the following: Two people to help with walking and/or transfers;Two people to help with bathing/dressing/bathroom   Can travel by private vehicle        Equipment Recommendations None recommended by PT  Recommendations for Other Services       Functional Status Assessment Patient has had a recent decline in their functional status and demonstrates the ability to make significant improvements in function in a reasonable and predictable amount of time.     Precautions / Restrictions Precautions Precautions: Fall Restrictions Weight Bearing Restrictions: No RLE Weight Bearing: Weight bearing as tolerated Other Position/Activity Restrictions: no mobility restrictions      Mobility  Bed Mobility Overal bed mobility: Needs Assistance Bed Mobility:  Rolling Rolling: Mod assist         General bed mobility comments: assist for rolling bilat for changing bed pad, assist for hip translation and use of pillow between LEs for pt comfort. boost up in bed max +1    Transfers                   General transfer comment: pt declines attempt for EOB or standing    Ambulation/Gait                  Stairs            Wheelchair Mobility     Tilt Bed    Modified Rankin (Stroke Patients Only)       Balance Overall balance assessment: Needs assistance, History of Falls (pt endorses other falls prior to this one)                                           Pertinent Vitals/Pain Pain Assessment Pain Assessment: 0-10 Pain Score: 8  Pain Location: R hip, incision site Pain Descriptors / Indicators: Aching, Discomfort, Dull, Grimacing, Guarding, Crying Pain Intervention(s): Limited activity within patient's tolerance, Monitored during session, Repositioned    Home Living Family/patient expects to be discharged to:: Private residence Living Arrangements: Alone Available Help at Discharge: Available PRN/intermittently Type of Home: Mobile home Home Access: Stairs to enter Entrance Stairs-Rails: Doctor, general practice of Steps: 6-7   Home Layout: One level Home Equipment: Wheelchair - Forensic psychologist (2 wheels);Cane - quad;Grab bars - tub/shower Additional Comments:  son or friend takes her to doctors appointments. Pt hopeful her sister can stay with her at [place]    Prior Function Prior Level of Function : Independent/Modified Independent             Mobility Comments: ind ADLs Comments: ind     Extremity/Trunk Assessment   Upper Extremity Assessment Upper Extremity Assessment: Defer to OT evaluation    Lower Extremity Assessment Lower Extremity Assessment: Generalized weakness;RLE deficits/detail RLE Deficits / Details: post-operative pain limiting assessment;  able to perform ankle pumps, quad sets, assisted heel slides RLE: Unable to fully assess due to pain       Communication   Communication Communication: No apparent difficulties  Cognition Arousal: Alert Behavior During Therapy: WFL for tasks assessed/performed Overall Cognitive Status: Within Functional Limits for tasks assessed                                 General Comments: self limiting due to pain        General Comments General comments (skin integrity, edema, etc.): bleeding from incision site, gown and bed pad with bloody drainage. PT assisted pt in cleanup and changing linens    Exercises General Exercises - Lower Extremity Ankle Circles/Pumps: AROM, Both, 10 reps, Supine Quad Sets: AROM, Right, 10 reps, Supine Heel Slides: AAROM, Right, 10 reps, Supine   Assessment/Plan    PT Assessment Patient needs continued PT services  PT Problem List Decreased strength;Decreased mobility;Decreased safety awareness;Decreased activity tolerance;Decreased balance;Decreased range of motion;Decreased knowledge of use of DME;Pain       PT Treatment Interventions DME instruction;Therapeutic activities;Gait training;Therapeutic exercise;Patient/family education;Balance training;Stair training;Functional mobility training;Neuromuscular re-education    PT Goals (Current goals can be found in the Care Plan section)  Acute Rehab PT Goals Patient Stated Goal: home PT Goal Formulation: With patient Time For Goal Achievement: 03/05/23 Potential to Achieve Goals: Fair    Frequency Min 1X/week     Co-evaluation               AM-PAC PT "6 Clicks" Mobility  Outcome Measure Help needed turning from your back to your side while in a flat bed without using bedrails?: A Lot Help needed moving from lying on your back to sitting on the side of a flat bed without using bedrails?: A Lot Help needed moving to and from a bed to a chair (including a wheelchair)?: Total Help  needed standing up from a chair using your arms (e.g., wheelchair or bedside chair)?: Total Help needed to walk in hospital room?: Total Help needed climbing 3-5 steps with a railing? : Total 6 Click Score: 8    End of Session   Activity Tolerance: Patient tolerated treatment well Patient left: with call bell/phone within reach;with bed alarm set;in bed;with family/visitor present;Other (comment) (pt declines SCD application given discomfort, PT encouarged ankle pumps) Nurse Communication: Mobility status PT Visit Diagnosis: Other abnormalities of gait and mobility (R26.89);Muscle weakness (generalized) (M62.81)    Time: 0981-1914 PT Time Calculation (min) (ACUTE ONLY): 25 min   Charges:   PT Evaluation $PT Eval Low Complexity: 1 Low PT Treatments $Therapeutic Activity: 8-22 mins PT General Charges $$ ACUTE PT VISIT: 1 Visit         Marye Round, PT DPT Acute Rehabilitation Services Secure Chat Preferred  Office [phone]   Skipper Dacosta Sheliah Plane 02/19/2023, 3:46 PM

## 2023-02-19 NOTE — Discharge Instructions (Signed)
Orthopaedic Trauma Service Discharge Instructions   General Discharge Instructions  Orthopaedic Injuries:  Right hip fracture treated with intramedullary nailing  WEIGHT BEARING STATUS: Weight-bear as tolerated right leg with walker  RANGE OF MOTION/ACTIVITY: Unrestricted range of motion of right hip and knee.  Activity as tolerated.  Slowly increase activity level  Bone health: Vitamin D levels look good.  Continue outpatient regimen.  Fracture is suggestive of fragility fracture.  Recommend discussing treatment options with your primary care physician.  Likely time for a new DEXA scan.  May need to choose a medication as an alternative to the Fosamax  Review the following resource for additional information regarding bone health  [email or web address].cy  Wound Care: Daily wound care as needed.  Continue to change dressing if there is drainage.  Can use 4 x 4 gauze and tape or a silicone foam dressing such as a Mepilex.  Once there is no drainage you can leave open to the air  Discharge Wound Care Instructions  Do NOT apply any ointments, solutions or lotions to pin sites or surgical wounds.  These prevent needed drainage and even though solutions like hydrogen peroxide kill bacteria, they also damage cells lining the pin sites that help fight infection.  Applying lotions or ointments can keep the wounds moist and can cause them to breakdown and open up as well. This can increase the risk for infection. When in doubt call the office.  Surgical incisions should be dressed daily.  If any drainage is noted, use one layer of adaptic or Mepitel, then gauze, Kerlix, and an ace wrap.  [email or web address].cz [email or web address]/?pd_rd_[email or web address]&[email or web address]  [email or web address]  These  dressing supplies should be available at local medical supply stores ([facility], [facility], etc). They are not usually carried at places like [facility], [facility], [facility], etc  Once the incision is completely dry and without drainage, it may be left open to air out.  Showering may begin 36-48 hours later.  Cleaning gently with soap and water.    DVT/PE prophylaxis: Plavix  Diet: as you were eating previously.  Can use over the counter stool softeners and bowel preparations, such as Miralax, to help with bowel movements.  Narcotics can be constipating.  Be sure to drink plenty of fluids  PAIN MEDICATION USE AND EXPECTATIONS  You have likely been given narcotic medications to help control your pain.  After a traumatic event that results in an fracture (broken bone) with or without surgery, it is ok to use narcotic pain medications to help control one's pain.  We understand that everyone responds to pain differently and each individual patient will be evaluated on a regular basis for the continued need for narcotic medications. Ideally, narcotic medication use should last no more than 6-8 weeks (coinciding with fracture healing).   As a patient it is your responsibility as well to monitor narcotic medication use and report the amount and frequency you use these medications when you come to your office visit.   We would also advise that if you are using narcotic medications, you should take a dose prior to therapy to maximize you participation.  IF YOU ARE ON NARCOTIC MEDICATIONS IT IS NOT PERMISSIBLE TO OPERATE A MOTOR VEHICLE (MOTORCYCLE/CAR/TRUCK/MOPED) OR HEAVY MACHINERY DO NOT MIX NARCOTICS WITH OTHER CNS (CENTRAL NERVOUS SYSTEM) DEPRESSANTS SUCH AS ALCOHOL   POST-OPERATIVE OPIOID TAPER INSTRUCTIONS: It is important to wean off of your opioid medication as soon as possible. If you do not need pain medication after your surgery it is ok  to stop day one. Opioids include: Codeine,  Hydrocodone(Norco, Vicodin), Oxycodone(Percocet, oxycontin) and hydromorphone amongst others.  Long term and even short term use of opiods can cause: Increased pain response Dependence Constipation Depression Respiratory depression And more.  Withdrawal symptoms can include Flu like symptoms Nausea, vomiting And more Techniques to manage these symptoms Hydrate well Eat regular healthy meals Stay active Use relaxation techniques(deep breathing, meditating, yoga) Do Not substitute Alcohol to help with tapering If you have been on opioids for less than two weeks and do not have pain than it is ok to stop all together.  Plan to wean off of opioids This plan should start within one week post op of your fracture surgery  Maintain the same interval or time between taking each dose and first decrease the dose.  Cut the total daily intake of opioids by one tablet each day Next start to increase the time between doses. The last dose that should be eliminated is the evening dose.    STOP SMOKING OR USING NICOTINE PRODUCTS!!!!  As discussed nicotine severely impairs your body's ability to heal surgical and traumatic wounds but also impairs bone healing.  Wounds and bone heal by forming microscopic blood vessels (angiogenesis) and nicotine is a vasoconstrictor (essentially, shrinks blood vessels).  Therefore, if vasoconstriction occurs to these microscopic blood vessels they essentially disappear and are unable to deliver necessary nutrients to the healing tissue.  This is one modifiable factor that you can do to dramatically increase your chances of healing your injury.    (This means no smoking, no nicotine gum, patches, etc)  DO NOT USE NONSTEROIDAL ANTI-INFLAMMATORY DRUGS (NSAID'S)  Using products such as Advil (ibuprofen), Aleve (naproxen), Motrin (ibuprofen) for additional pain control during fracture healing can delay and/or prevent the healing response.  If you would like to take over the  counter (OTC) medication, Tylenol (acetaminophen) is ok.  However, some narcotic medications that are given for pain control contain acetaminophen as well. Therefore, you should not exceed more than 4000 mg of tylenol in a day if you do not have liver disease.  Also note that there are may OTC medicines, such as cold medicines and allergy medicines that my contain tylenol as well.  If you have any questions about medications and/or interactions please ask your doctor/PA or your pharmacist.      ICE AND ELEVATE INJURED/OPERATIVE EXTREMITY  Using ice and elevating the injured extremity above your heart can help with swelling and pain control.  Icing in a pulsatile fashion, such as 20 minutes on and 20 minutes off, can be followed.    Do not place ice directly on skin. Make sure there is a barrier between to skin and the ice pack.    Using frozen items such as frozen peas works well as the conform nicely to the are that needs to be iced.  USE AN ACE WRAP OR TED HOSE FOR SWELLING CONTROL  In addition to icing and elevation, Ace wraps or TED hose are used to help limit and resolve swelling.  It is recommended to use Ace wraps or TED hose until you are informed to stop.    When using Ace Wraps start the wrapping distally (farthest away from the body) and wrap proximally (closer to the body)   Example: If you had surgery on your leg or thing and you do not have a splint on, start the ace wrap at the toes and work your way up to the thigh  If you had surgery on your upper extremity and do not have a splint on, start the ace wrap at your fingers and work your way up to the upper arm  IF YOU ARE IN A SPLINT OR CAST DO NOT REMOVE IT FOR ANY REASON   If your splint gets wet for any reason please contact the office immediately. You may shower in your splint or cast as long as you keep it dry.  This can be done by wrapping in a cast cover or garbage back (or similar)  Do Not stick any thing down your splint  or cast such as pencils, money, or hangers to try and scratch yourself with.  If you feel itchy take benadryl as prescribed on the bottle for itching  IF YOU ARE IN A CAM BOOT (BLACK BOOT)  You may remove boot periodically. Perform daily dressing changes as noted below.  Wash the liner of the boot regularly and wear a sock when wearing the boot. It is recommended that you sleep in the boot until told otherwise    Call office for the following: Temperature greater than 101F Persistent nausea and vomiting Severe uncontrolled pain Redness, tenderness, or signs of infection (pain, swelling, redness, odor or green/yellow discharge around the site) Difficulty breathing, headache or visual disturbances Hives Persistent dizziness or light-headedness Extreme fatigue Any other questions or concerns you may have after discharge  In an emergency, call 911 or go to an Emergency Department at a nearby hospital  HELPFUL INFORMATION  If you had a block, it will wear off between 8-24 hrs postop typically.  This is period when your pain may go from nearly zero to the pain you would have had postop without the block.  This is an abrupt transition but nothing dangerous is happening.  You may take an extra dose of narcotic when this happens.  You should wean off your narcotic medicines as soon as you are able.  Most patients will be off or using minimal narcotics before their first postop appointment.   We suggest you use the pain medication the first night prior to going to bed, in order to ease any pain when the anesthesia wears off. You should avoid taking pain medications on an empty stomach as it will make you nauseous.  Do not drink alcoholic beverages or take illicit drugs when taking pain medications.  In most states it is against the law to drive while you are in a splint or sling.  And certainly against the law to drive while taking narcotics.  You may return to work/school in the next couple of  days when you feel up to it.   Pain medication may make you constipated.  Below are a few solutions to try in this order: Decrease the amount of pain medication if you aren't having pain. Drink lots of decaffeinated fluids. Drink prune juice and/or each dried prunes  If the first 3 don't work start with additional solutions Take Colace - an over-the-counter stool softener Take Senokot - an over-the-counter laxative Take Miralax - a stronger over-the-counter laxative     CALL THE OFFICE WITH ANY QUESTIONS OR CONCERNS: [phone]   VISIT OUR WEBSITE FOR ADDITIONAL INFORMATION: [email or web address]

## 2023-02-19 NOTE — Progress Notes (Signed)
Orthopaedic Trauma Service Progress Note  Patient ID: Kendra Lee MRN: [id number] DOB/AGE: 1955/09/28 67 y.o.  Subjective:  R hip sore Pain tolerable  Dressing was reinforced overnight  On plavix PTA   ROS As above  Objective:   VITALS:   Vitals:   02/18/23 1930 02/18/23 1948 02/19/23 0020 02/19/23 0613  BP: (!) 171/70 (!) 157/83 118/63 138/66  Pulse: 76 73 69 80  Resp: 12 15 16 15   Temp: 97.7 F (36.5 C) 97.8 F (36.6 C) 97.7 F (36.5 C) 98.7 F (37.1 C)  TempSrc:  Axillary Axillary Axillary  SpO2: 96% 93% 92% 99%  Weight:      Height:        Estimated body mass index is 17.57 kg/m as calculated from the following:   Height as of this encounter: 5\' 1"  (1.549 m).   Weight as of this encounter: 42.2 kg.   Intake/Output      11/26 0701 11/27 0700 11/27 0701 11/28 0700   P.O. 0    I.V. (mL/kg) 900 (21.3)    IV Piggyback 450    Total Intake(mL/kg) 1350 (32)    Urine (mL/kg/hr) 650 (0.6)    Blood 100    Total Output 750    Net +600           LABS  Results for orders placed or performed during the hospital encounter of 02/17/23 (from the past 24 hour(s))  CBC     Status: Abnormal   Collection Time: 02/19/23  6:03 AM  Result Value Ref Range   WBC 9.9 4.0 - 10.5 K/uL   RBC 2.72 (L) 3.87 - 5.11 MIL/uL   Hemoglobin 8.6 (L) 12.0 - 15.0 g/dL   HCT 32.9 (L) 51.8 - 84.1 %   MCV 96.7 80.0 - 100.0 fL   MCH 31.6 26.0 - 34.0 pg   MCHC 32.7 30.0 - 36.0 g/dL   RDW 66.0 63.0 - 16.0 %   Platelets 198 150 - 400 K/uL   nRBC 0.0 0.0 - 0.2 %  Basic metabolic panel     Status: Abnormal   Collection Time: 02/19/23  6:03 AM  Result Value Ref Range   Sodium 138 135 - 145 mmol/L   Potassium 3.8 3.5 - 5.1 mmol/L   Chloride 106 98 - 111 mmol/L   CO2 21 (L) 22 - 32 mmol/L   Glucose, Bld 75 70 - 99 mg/dL   BUN 8 8 - 23 mg/dL   Creatinine, Ser 1.09 0.44 - 1.00 mg/dL   Calcium 7.8 (L) 8.9 -  10.3 mg/dL   GFR, Estimated >32 >35 mL/min   Anion gap 11 5 - 15  VITAMIN D 25 Hydroxy (Vit-D Deficiency, Fractures)     Status: None   Collection Time: 02/19/23  7:36 AM  Result Value Ref Range   Vit D, 25-Hydroxy 58.36 30 - 100 ng/mL     PHYSICAL EXAM:   Gen: sitting up in bed, NAD Lungs: unlabored Ext:       Right Lower Extremity Dressing mid thigh changed, saturated with blood  Incision looks ok   Bloody drainage noted from wound but no active bleeding  Mild hematoma to lateral thigh   Extremity is warm  No DCT  Compartments are soft  No pain out of proportion with passive stretching  of his toes or ankle  DPN, SPN, TN sensory functions are intact  EHL, FHL, lesser toe motor functions intact  Ankle flexion, extension, inversion eversion intact  Ankle comes to about 5 degrees of extension    EHL strength symmetric to contra-lateral side   + DP pulse   Assessment/Plan: 1 Day Post-Op   Principal Problem:   Closed right hip fracture (HCC) Active Problems:   Essential hypertension   Hypokalemia   Dyslipidemia   Hyponatremia   Malnutrition of moderate degree   Anti-infectives (From admission, onward)    Start     Dose/Rate Route Frequency Ordered Stop   02/19/23 0641  ceFAZolin (ANCEF) IVPB 2g/100 mL premix        2 g 200 mL/hr over 30 Minutes Intravenous Every 6 hours 02/19/23 0641 02/19/23 1929   02/18/23 1500  ceFAZolin (ANCEF) IVPB 2g/100 mL premix        2 g 200 mL/hr over 30 Minutes Intravenous On call to O.R. 02/18/23 1243 02/18/23 1725     .  POD/HD#: 1  67 y/o female s/p fall with R hip fracture   - fall   -Right intertrochanteric hip fracture s/p intramedullary nailing Weightbearing Weight-bear as tolerated right leg with assistance using walker   ROM/Activity   Unrestricted range of motion right hip and knee.  Activity as tolerated   Wound care   Daily wound care as needed.  Okay to reinforce dressings and change dressings as needed at  this point.  Would expect some continued drainage given her Plavix use.  I will hold Plavix until discharge.  She did receive a dose of TXA this morning postoperatively    I did remove her Ace wrap as she noted this was uncomfortable.  Pressure dressing can be replaced if needed.  Continue with ice   Therapy evaluations  - Pain management:  Multimodal  Minimize narcotics  - ABL anemia/Hemodynamics  Monitor  CBC in the morning  Hold Plavix until discharge  - Medical issues   Per primary  - DVT/PE prophylaxis:  SCDs  Resume Plavix at discharge - ID:   Perioperative antibiotics - Metabolic Bone Disease:  Vitamin D levels look good however fracture is a fragility fracture and patient has baseline osteoporosis  Most recent DEXA was January 2023.  She will likely be due January 2025   Would hold Fosamax at discharge.  May need to consider alternative agent given hip fracture while on Fosamax    - Activity:  As above  - Impediments to fracture healing:  Osteoporosis/fragility fracture  Nicotine dependence  - Dispo:  Therapy evals  SNF vs home with [facility]    Mearl Latin, PA-C [phone] (C) 02/19/2023, 9:43 AM  [facility] [address] [place] [address] [phone] Val [phone] (F)    After 5pm and on the weekends please log on to Amion, go to orthopaedics and the look under the [facility] Call for the provider(s) on call. You can also call our office at [phone] and then follow the prompts to be connected to the call team.  Patient ID: Kendra Lee, female   DOB: 12-06-1955, 67 y.o.   MRN: [id number]

## 2023-02-20 DIAGNOSIS — S72141A Displaced intertrochanteric fracture of right femur, initial encounter for closed fracture: Secondary | ICD-10-CM | POA: Diagnosis not present

## 2023-02-20 LAB — CBC
HCT: 22.5 % — ABNORMAL LOW (ref 36.0–46.0)
Hemoglobin: 7.2 g/dL — ABNORMAL LOW (ref 12.0–15.0)
MCH: 30.4 pg (ref 26.0–34.0)
MCHC: 32 g/dL (ref 30.0–36.0)
MCV: 94.9 fL (ref 80.0–100.0)
Platelets: 178 10*3/uL (ref 150–400)
RBC: 2.37 MIL/uL — ABNORMAL LOW (ref 3.87–5.11)
RDW: 13.2 % (ref 11.5–15.5)
WBC: 10.6 10*3/uL — ABNORMAL HIGH (ref 4.0–10.5)
nRBC: 0 % (ref 0.0–0.2)

## 2023-02-20 LAB — BASIC METABOLIC PANEL
Anion gap: 4 — ABNORMAL LOW (ref 5–15)
BUN: 10 mg/dL (ref 8–23)
CO2: 25 mmol/L (ref 22–32)
Calcium: 8.2 mg/dL — ABNORMAL LOW (ref 8.9–10.3)
Chloride: 105 mmol/L (ref 98–111)
Creatinine, Ser: 0.6 mg/dL (ref 0.44–1.00)
GFR, Estimated: >60 mL/min (ref >60)
Glucose, Bld: 122 mg/dL — ABNORMAL HIGH (ref 70–99)
Potassium: 3.7 mmol/L (ref 3.5–5.1)
Sodium: 134 mmol/L — ABNORMAL LOW (ref 135–145)

## 2023-02-20 LAB — HEMOGLOBIN AND HEMATOCRIT, BLOOD
HCT: 23.1 % — ABNORMAL LOW (ref 36.0–46.0)
Hemoglobin: 7.5 g/dL — ABNORMAL LOW (ref 12.0–15.0)

## 2023-02-20 MED ORDER — IRON SUCROSE 200 MG IVPB - SIMPLE MED
200.0000 mg | Status: DC
  Administered 2023-02-20: 200 mg via INTRAVENOUS
  Filled 2023-02-20: qty 200
  Filled 2023-02-20: qty 110

## 2023-02-20 MED ORDER — SODIUM CHLORIDE 0.9 % IV SOLN
200.0000 mg | INTRAVENOUS | Status: AC
  Administered 2023-02-21 – 2023-02-22 (×2): 200 mg via INTRAVENOUS
  Filled 2023-02-20 (×2): qty 10

## 2023-02-20 NOTE — Progress Notes (Signed)
  Progress Note   Patient: Kendra Lee WUJ:[id number] DOB: 06/24/1955 DOA: 02/17/2023     3 DOS: the patient was seen and examined on 02/20/2023   Brief hospital course  Assessment and Plan:  67 year old female with history of CVA, hypertension, ongoing tobacco abuse comes to the emergency department with acute onset of right hip pain while patient was working on her fireplace bending lost her balance fell on her right side, unable to ambulate.  Denied any loss of consciousness.  Workup in the emergency department patient was found to have potassium of 3.3, sodium 134, elevated white blood cell count of 16,000.  EKG showed normal sinus rhythm.  X-ray of the hip showed nondisplaced right femoral intertrochanteric fracture.  Closed right intertrochanteric fracture -Continue with the pain management as needed -Orthopedic surgery is consulted-s/p intramedullary nailing 11/26 -Bleeding at the surgical site, applied pressure dressing. -Holding Plavix -Physical therapy, Occupational Therapy.  Acute blood loss anemia -Admitted with a hemoglobin of 11.1-trended down to 8.6-7.5-7.2 -Closely follow-up with CBC -Keep the patient on IV iron  Hypokalemia -Replaced by mouth. 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  Hypertension accelerated -Increase the dose of amlodipine to 10 mg daily -Well-controlled  CVA -Holding Plavix for now, continue with the statin.  Hyperlipidemia -Continue statin.   Subjective: Complaining of pain in the right hip.  Bleeding from the Physical Exam: Vitals:   02/19/23 1435 02/19/23 1932 02/20/23 0350 02/20/23 0754  BP: 132/76 127/60 101/64 107/63  Pulse: 92 82 83 85  Resp: 18 18 18 16   Temp: 98.4 F (36.9 C) 98.6 F (37 C) 98 F (36.7 C) 98.5 F (36.9 C)  TempSrc: Oral Oral Oral Oral  SpO2: 96% 100% 100% 100%  Weight:      Height:       GENERAL:  67 y.o.-year-old Caucasian thin built female patient lying in the bed  with no acute distress.  EYES: Pupils equal, round, reactive to light and accommodation. No scleral icterus. Extraocular muscles intact.  HEENT: Head atraumatic, normocephalic. Oropharynx and nasopharynx clear.  NECK:  Supple, no jugular venous distention. No thyroid enlargement, no tenderness.  LUNGS: Normal breath sounds bilaterally, no wheezing, rales,rhonchi or crepitation. No use of accessory muscles of respiration.  CARDIOVASCULAR: Regular rate and rhythm, S1, S2 normal. No murmurs, rubs, or gallops.  ABDOMEN: Soft, nondistended, nontender. Bowel sounds present. No organomegaly or mass.  EXTREMITIES: No pedal edema, cyanosis, or clubbing.  NEUROLOGIC: Cranial nerves II through XII are intact. Muscle strength 5/5 in all extremities. Sensation intact. Gait not checked.  PSYCHIATRIC: The patient is alert and oriented x 3.  Normal affect and good eye contact. SKIN: No obvious rash, lesion, or ulcer.  Musculoskeletal: bleeding at the surgical site  Data Reviewed:  Family Communication: Patient  Disposition: Status is: Inpatient Remains inpatient appropriate because: Hip fracture  Planned Discharge Destination: Skilled nursing facility  Time spent: 35 minutes  Author: Susa Griffins, MD 02/20/2023 8:31 AM  For on call review [email or web address].

## 2023-02-20 NOTE — Plan of Care (Signed)
  Problem: Clinical Measurements: Goal: Ability to maintain clinical measurements within normal limits will improve Outcome: Progressing Goal: Will remain free from infection Outcome: Progressing   Problem: Pain Management: Goal: General experience of comfort will improve Outcome: Progressing   Problem: Safety: Goal: Ability to remain free from injury will improve Outcome: Progressing   Problem: Skin Integrity: Goal: Risk for impaired skin integrity will decrease Outcome: Progressing

## 2023-02-20 NOTE — Progress Notes (Signed)
   ORTHOPAEDIC PROGRESS NOTE  s/p Procedure(s): INTRAMEDULLARY (IM) NAIL INTERTROCHANTERIC 11/26 Dr. Carola Frost  SUBJECTIVE: Reports mild pain about operative site. No chest pain. No SOB. No nausea/vomiting. No other complaints. States her dressing feels more dry   OBJECTIVE: PE:  Vitals:   02/20/23 0350 02/20/23 0754  BP: 101/64 107/63  Pulse: 83 85  Resp: 18 16  Temp: 98 F (36.7 C) 98.5 F (36.9 C)  SpO2: 100% 100%   Gen: Elderly, chronically ill appearing 67 yo F laying in bed NAD Resp: breathing comfortably on 2L [place]. Intermittent productive cough appreciated RLE: Incision present to right upper lateral thigh and ace wrap distal to this are clean, dry, and intact. Bandages without any active drainage. She is neurovascularly intact distally. Ankle and toe dorsi/plantar flexion intact.   ASSESSMENT: Kendra Lee is a 67 y.o. female s/p IM nail right hip by Dr. Carola Frost 11/26  PLAN: Weightbearing: WBAT RLE with assistant using walker. Unrestricted ROM of right hip and knee.  Insicional and dressing care: Reinforce dressings as needed Orthopedic device(s): None Showering: POD 3 VTE prophylaxis:  SCDs   , plan to resume plavix at discharge Pain control: multimodal  Follow - up plan:  Follow up pending dispo plan. DC home with HH vs SNF. Likely to f/u Dr. Carola Frost 2 weeks post op.  Contact information:  After 5pm and on the weekends please log on to Amion, go to orthopaedics and the look under the [facility] Call for the provider(s) on call. You can also call our office at [phone] and then follow the prompts to be connected to the call team.

## 2023-02-20 NOTE — Plan of Care (Signed)
  Problem: Activity: °Goal: Risk for activity intolerance will decrease °Outcome: Progressing °  °Problem: Nutrition: °Goal: Adequate nutrition will be maintained °Outcome: Progressing °  °Problem: Coping: °Goal: Level of anxiety will decrease °Outcome: Progressing °  °

## 2023-02-21 DIAGNOSIS — S72141A Displaced intertrochanteric fracture of right femur, initial encounter for closed fracture: Secondary | ICD-10-CM | POA: Diagnosis not present

## 2023-02-21 LAB — CBC
HCT: 24.3 % — ABNORMAL LOW (ref 36.0–46.0)
Hemoglobin: 8 g/dL — ABNORMAL LOW (ref 12.0–15.0)
MCH: 31.5 pg (ref 26.0–34.0)
MCHC: 32.9 g/dL (ref 30.0–36.0)
MCV: 95.7 fL (ref 80.0–100.0)
Platelets: 215 10*3/uL (ref 150–400)
RBC: 2.54 MIL/uL — ABNORMAL LOW (ref 3.87–5.11)
RDW: 13.5 % (ref 11.5–15.5)
WBC: 8.5 10*3/uL (ref 4.0–10.5)
nRBC: 0 % (ref 0.0–0.2)

## 2023-02-21 LAB — BASIC METABOLIC PANEL
Anion gap: 7 (ref 5–15)
Anion gap: 7 (ref 5–15)
BUN: 10 mg/dL (ref 8–23)
BUN: 9 mg/dL (ref 8–23)
CO2: 26 mmol/L (ref 22–32)
CO2: 28 mmol/L (ref 22–32)
Calcium: 8.6 mg/dL — ABNORMAL LOW (ref 8.9–10.3)
Calcium: 8.9 mg/dL (ref 8.9–10.3)
Chloride: 105 mmol/L (ref 98–111)
Chloride: 104 mmol/L (ref 98–111)
Creatinine, Ser: 0.61 mg/dL (ref 0.44–1.00)
Creatinine, Ser: 0.56 mg/dL (ref 0.44–1.00)
GFR, Estimated: >60 mL/min (ref >60)
GFR, Estimated: >60 mL/min (ref >60)
Glucose, Bld: 98 mg/dL (ref 70–99)
Glucose, Bld: 107 mg/dL — ABNORMAL HIGH (ref 70–99)
Potassium: 3.8 mmol/L (ref 3.5–5.1)
Potassium: 3.9 mmol/L (ref 3.5–5.1)
Sodium: 138 mmol/L (ref 135–145)
Sodium: 139 mmol/L (ref 135–145)

## 2023-02-21 MED ORDER — AZITHROMYCIN 250 MG PO TABS
500.0000 mg | ORAL_TABLET | Freq: Every day | ORAL | Status: DC
  Administered 2023-02-21 – 2023-02-24 (×4): 500 mg via ORAL
  Filled 2023-02-21 (×4): qty 2

## 2023-02-21 MED ORDER — IPRATROPIUM-ALBUTEROL 0.5-2.5 (3) MG/3ML IN SOLN
3.0000 mL | Freq: Four times a day (QID) | RESPIRATORY_TRACT | Status: DC
  Administered 2023-02-21 – 2023-02-22 (×4): 3 mL via RESPIRATORY_TRACT
  Filled 2023-02-21 (×4): qty 3

## 2023-02-21 MED ORDER — METHYLPREDNISOLONE SODIUM SUCC 125 MG IJ SOLR
80.0000 mg | INTRAMUSCULAR | Status: DC
  Administered 2023-02-21 – 2023-02-22 (×2): 80 mg via INTRAVENOUS
  Filled 2023-02-21 (×2): qty 2

## 2023-02-21 NOTE — Plan of Care (Signed)
  Problem: Education: Goal: Knowledge of General Education information will improve Description Including pain rating scale, medication(s)/side effects and non-pharmacologic comfort measures Outcome: Progressing   

## 2023-02-21 NOTE — *Deleted (Incomplete)
Patient due to void post foley catheter removal at 10pm, bladder scan done at 0000

## 2023-02-21 NOTE — Progress Notes (Signed)
Physical Therapy Treatment Patient Details Name: Kendra Lee MRN: [id number] DOB: [birth year] Today's Date: 02/21/2023   History of Present Illness 67 yo female presents to [facility] on 11/25 s/p fall, sustained R nondisplaced femoral intertrochanteric fx. S/p R hip IMN 11/26. PMH includes CVA, HTN, tobacco abuse.    PT Comments  Pt in bed upon arrival and agreeable to PT session. Worked on transfers, gait training, and LE strength in today's session. Pt required MaxAx1 for bed mobility likely due to pain in R LE with movement. Pt was able to perform multiple stands with Stedy and ModAx2. Once in standing, pt was able to weight shift and maintain balance with CGA. Pt is progressing well towards goals. Acute PT to follow.      If plan is discharge home, recommend the following: Two people to help with walking and/or transfers;Two people to help with bathing/dressing/bathroom   Can travel by private vehicle     No  Equipment Recommendations  None recommended by PT       Precautions / Restrictions Precautions Precautions: Fall Restrictions Weight Bearing Restrictions: Yes RLE Weight Bearing: Weight bearing as tolerated Other Position/Activity Restrictions: no mobility restrictions     Mobility  Bed Mobility Overal bed mobility: Needs Assistance Bed Mobility: Supine to Sit, Sit to Supine     Supine to sit: Max assist, HOB elevated, Used rails Sit to supine: Max assist, Used rails   General bed mobility comments: MaxA to assist RLE towards EOB and assist with trunk navigation    Transfers Overall transfer level: Needs assistance Equipment used: Ambulation equipment used Transfers: Sit to/from Stand Sit to Stand: Mod assist, +2 physical assistance, Min assist, Via lift equipment      General transfer comment: ModAx2 from bed level for boost up and initial rise. Increased time and effort w/ multiple cues to stand upright and tuck bottom. MinAx2 from elevated Stedy  surface Transfer via Lift Equipment: Stedy  Ambulation/Gait  General Gait Details: deferred      Balance Overall balance assessment: Needs assistance, History of Falls Sitting-balance support: Feet supported, No upper extremity supported Sitting balance-Leahy Scale: Fair     Standing balance support: Bilateral upper extremity supported, During functional activity, Reliant on assistive device for balance Standing balance-Leahy Scale: Poor Standing balance comment: reliant on UE support, CGA once in standing      Cognition Arousal: Alert Behavior During Therapy: WFL for tasks assessed/performed Overall Cognitive Status: Within Functional Limits for tasks assessed      Exercises Other Exercises Other Exercises: x3 STS from elevated Stedy surface, x2 STS from bed level w/ Stedy Other Exercises: B weight shifts, 2 x10 reps    General Comments General comments (skin integrity, edema, etc.): VSS on 3L, bandage intact with no leaking      Pertinent Vitals/Pain Pain Assessment Pain Assessment: Faces Faces Pain Scale: Hurts little more Pain Location: R hip, incision site Pain Descriptors / Indicators: Aching, Discomfort, Dull, Grimacing, Guarding, Crying Pain Intervention(s): Limited activity within patient's tolerance, Monitored during session, Repositioned     PT Goals (current goals can now be found in the care plan section) Acute Rehab PT Goals PT Goal Formulation: With patient Time For Goal Achievement: 03/05/23 Potential to Achieve Goals: Fair Progress towards PT goals: Progressing toward goals    Frequency    Min 1X/week          AM-PAC PT "6 Clicks" Mobility   Outcome Measure  Help needed turning from your back to your side  while in a flat bed without using bedrails?: A Lot Help needed moving from lying on your back to sitting on the side of a flat bed without using bedrails?: A Lot Help needed moving to and from a bed to a chair (including a  wheelchair)?: Total Help needed standing up from a chair using your arms (e.g., wheelchair or bedside chair)?: Total Help needed to walk in hospital room?: Total Help needed climbing 3-5 steps with a railing? : Total 6 Click Score: 8    End of Session Equipment Utilized During Treatment: Gait belt;Oxygen Activity Tolerance: Patient tolerated treatment well Patient left: in bed;with call bell/phone within reach;with bed alarm set Nurse Communication: Mobility status PT Visit Diagnosis: Other abnormalities of gait and mobility (R26.89);Muscle weakness (generalized) (M62.81)     Time: 8657-8469 PT Time Calculation (min) (ACUTE ONLY): 32 min  Charges:    $Therapeutic Exercise: 8-22 mins $Therapeutic Activity: 8-22 mins PT General Charges $$ ACUTE PT VISIT: 1 Visit                    Hilton Cork, PT, DPT Secure Chat Preferred  Rehab Office [phone]   Arturo Morton Brion Aliment 02/21/2023, 5:00 PM

## 2023-02-21 NOTE — Plan of Care (Signed)
  Problem: Activity: Goal: Risk for activity intolerance will decrease Outcome: Progressing   Problem: Nutrition: Goal: Adequate nutrition will be maintained Outcome: Progressing   Problem: Elimination: Goal: Will not experience complications related to urinary retention Outcome: Progressing   

## 2023-02-21 NOTE — Progress Notes (Signed)
  Progress Note   Patient: Kendra Lee ZOX:[id number] DOB: [birth year] DOA: 02/17/2023     4 DOS: the patient was seen and examined on 02/21/2023   Brief hospital course  Assessment and Plan:  67 year old female with history of CVA, hypertension, ongoing tobacco abuse comes to the emergency department with acute onset of right hip pain while patient was working on her fireplace bending lost her balance fell on her right side, unable to ambulate.  Denied any loss of consciousness.  Workup in the emergency department patient was found to have potassium of 3.3, sodium 134, elevated white blood cell count of 16,000.  EKG showed normal sinus rhythm.  X-ray of the hip showed nondisplaced right femoral intertrochanteric fracture.  Closed right intertrochanteric fracture -Continue with the pain management as needed -Orthopedic surgery is consulted-s/p intramedullary nailing 11/26 -Bleeding at the surgical site, applied pressure dressing. -Holding Plavix -Physical therapy, Occupational Therapy-recommended SNF.  Acute blood loss anemia -Admitted with a hemoglobin of 11.1-trended down to 8.6-7.5-7.2 -Closely follow-up with CBC -Keep the patient on IV iron  COPD exacerbation -Patient has significant congested cough -Keep the patient on DuoNebs, azithromycin, Solu-Medrol  Hypokalemia -Replaced by mouth. 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 -Resolved  Hypertension accelerated -Increase the dose of amlodipine to 10 mg daily -Well-controlled  CVA -Holding Plavix for now, continue with the statin.  Hyperlipidemia -Continue statin.   Subjective: Complaining of pain in the right hip.  Bleeding from the Physical Exam: Vitals:   02/20/23 1421 02/20/23 2033 02/21/23 0446 02/21/23 0730  BP: (!) 83/64 107/66 (!) 144/69 (!) 148/75  Pulse: 96 87 90 83  Resp:  16 17 18   Temp: 98 F (36.7 C) 98.2 F (36.8 C) 98.1 F (36.7 C) 98.2 F (36.8 C)  TempSrc:  Oral     SpO2: 98% 99% 99% 100%  Weight:      Height:       GENERAL:  67 y.o.-year-old Caucasian thin built female patient lying in the bed with no acute distress.  EYES: Pupils equal, round, reactive to light and accommodation. No scleral icterus. Extraocular muscles intact.  HEENT: Head atraumatic, normocephalic. Oropharynx and nasopharynx clear.  NECK:  Supple, no jugular venous distention. No thyroid enlargement, no tenderness.  LUNGS: Normal breath sounds bilaterally, no wheezing, rales,rhonchi or crepitation. No use of accessory muscles of respiration.  CARDIOVASCULAR: Regular rate and rhythm, S1, S2 normal. No murmurs, rubs, or gallops.  ABDOMEN: Soft, nondistended, nontender. Bowel sounds present. No organomegaly or mass.  EXTREMITIES: No pedal edema, cyanosis, or clubbing.  NEUROLOGIC: Cranial nerves II through XII are intact. Muscle strength 5/5 in all extremities. Sensation intact. Gait not checked.  PSYCHIATRIC: The patient is alert and oriented x 3.  Normal affect and good eye contact. SKIN: No obvious rash, lesion, or ulcer.  Musculoskeletal: No further episodes of bleeding Data Reviewed:  Family Communication: Patient  Disposition: Status is: Inpatient Remains inpatient appropriate because: Hip fracture  Planned Discharge Destination: Skilled nursing facility  Time spent: 35 minutes  Author: Susa Griffins, MD 02/21/2023 9:05 AM  For on call review [email or web address].

## 2023-02-21 NOTE — Progress Notes (Signed)
   ORTHOPAEDIC PROGRESS NOTE  s/p Procedure(s): INTRAMEDULLARY (IM) NAIL INTERTROCHANTERIC 11/26 Dr. Carola Frost  SUBJECTIVE: Patient eating lunch when entering the room. Pain is better controlled today. Says there was no issue with saturation and bleeding through bandage and has been dry since yesterday. Awaiting placement.    OBJECTIVE: PE:  Vitals:   02/21/23 0446 02/21/23 0730  BP: (!) 144/69 (!) 148/75  Pulse: 90 83  Resp: 17 18  Temp: 98.1 F (36.7 C) 98.2 F (36.8 C)  SpO2: 99% 100%   Gen: Elderly, chronically ill appearing 67 yo F laying in bed NAD Resp: breathing comfortably on 2L [place]. Intermittent productive cough appreciated RLE: Incision present to right upper lateral thigh and ace wrap distal to this are clean, dry, and intact. Bandages without any active drainage. She is neurovascularly intact distally. Ankle and toe dorsi/plantar flexion intact.   ASSESSMENT: Kendra Lee is a 67 y.o. female s/p IM nail right hip by Dr. Carola Frost 11/26  PLAN: Weightbearing: WBAT RLE with assistant using walker. Unrestricted ROM of right hip and knee.  Insicional and dressing care: Reinforce dressings as needed Orthopedic device(s): None Showering: POD 3 VTE prophylaxis:  SCDs   , plan to resume plavix at discharge Pain control: multimodal  Follow - up plan:  Follow up pending dispo plan. DC home with HH vs SNF. Likely to f/u Dr. Carola Frost 2 weeks post op.  Contact information:  After 5pm and on the weekends please log on to Amion, go to orthopaedics and the look under the [facility] Call for the provider(s) on call. You can also call our office at [phone] and then follow the prompts to be connected to the call team.

## 2023-02-22 DIAGNOSIS — S72001A Fracture of unspecified part of neck of right femur, initial encounter for closed fracture: Secondary | ICD-10-CM | POA: Diagnosis not present

## 2023-02-22 LAB — CBC
HCT: 21.9 % — ABNORMAL LOW (ref 36.0–46.0)
HCT: 22.5 % — ABNORMAL LOW (ref 36.0–46.0)
Hemoglobin: 6.9 g/dL — CL (ref 12.0–15.0)
Hemoglobin: 7.3 g/dL — ABNORMAL LOW (ref 12.0–15.0)
MCH: 30.4 pg (ref 26.0–34.0)
MCH: 31.1 pg (ref 26.0–34.0)
MCHC: 31.5 g/dL (ref 30.0–36.0)
MCHC: 32.4 g/dL (ref 30.0–36.0)
MCV: 96.5 fL (ref 80.0–100.0)
MCV: 95.7 fL (ref 80.0–100.0)
Platelets: 232 10*3/uL (ref 150–400)
Platelets: 233 10*3/uL (ref 150–400)
RBC: 2.27 MIL/uL — ABNORMAL LOW (ref 3.87–5.11)
RBC: 2.35 MIL/uL — ABNORMAL LOW (ref 3.87–5.11)
RDW: 13.4 % (ref 11.5–15.5)
RDW: 13.4 % (ref 11.5–15.5)
WBC: 9.1 10*3/uL (ref 4.0–10.5)
WBC: 9.9 10*3/uL (ref 4.0–10.5)
nRBC: 0 % (ref 0.0–0.2)
nRBC: 0 % (ref 0.0–0.2)

## 2023-02-22 LAB — BASIC METABOLIC PANEL
Anion gap: 6 (ref 5–15)
BUN: 16 mg/dL (ref 8–23)
CO2: 29 mmol/L (ref 22–32)
Calcium: 9 mg/dL (ref 8.9–10.3)
Chloride: 103 mmol/L (ref 98–111)
Creatinine, Ser: 0.63 mg/dL (ref 0.44–1.00)
GFR, Estimated: >60 mL/min (ref >60)
Glucose, Bld: 112 mg/dL — ABNORMAL HIGH (ref 70–99)
Potassium: 5 mmol/L (ref 3.5–5.1)
Sodium: 138 mmol/L (ref 135–145)

## 2023-02-22 MED ORDER — HYDROCODONE-ACETAMINOPHEN 5-325 MG PO TABS: 1.0000 | ORAL_TABLET | Freq: Four times a day (QID) | ORAL | 0 refills | Status: DC | PRN

## 2023-02-22 MED ORDER — LOSARTAN POTASSIUM 100 MG PO TABS: 100.0000 mg | ORAL_TABLET | Freq: Every day | ORAL | 0 refills | Status: DC

## 2023-02-22 MED ORDER — PREDNISONE 20 MG PO TABS: 20.0000 mg | ORAL_TABLET | Freq: Every day | ORAL | 0 refills | Status: DC

## 2023-02-22 MED ORDER — AMLODIPINE BESYLATE 10 MG PO TABS: 10.0000 mg | ORAL_TABLET | Freq: Every day | ORAL | 0 refills | Status: DC

## 2023-02-22 MED ORDER — AZITHROMYCIN 250 MG PO TABS: ORAL_TABLET | ORAL | 0 refills | Status: DC

## 2023-02-22 MED ORDER — HYDROCODONE-ACETAMINOPHEN 5-325 MG PO TABS: 1.0000 | ORAL_TABLET | Freq: Four times a day (QID) | ORAL | 0 refills | Status: AC | PRN

## 2023-02-22 MED ORDER — IPRATROPIUM-ALBUTEROL 0.5-2.5 (3) MG/3ML IN SOLN: 3.0000 mL | Freq: Four times a day (QID) | RESPIRATORY_TRACT | Status: DC

## 2023-02-22 MED ORDER — IPRATROPIUM-ALBUTEROL 0.5-2.5 (3) MG/3ML IN SOLN
3.0000 mL | Freq: Two times a day (BID) | RESPIRATORY_TRACT | Status: DC
  Administered 2023-02-22 – 2023-02-25 (×6): 3 mL via RESPIRATORY_TRACT
  Filled 2023-02-22 (×7): qty 3

## 2023-02-22 NOTE — TOC Progression Note (Signed)
Transition of Care [facility]) - Progression Note    Patient Details  Name: Kentlee Bibeau MRN: [id number] Date of Birth: 1955/09/14  Transition of Care [facility]) CM/SW Contact  Ronny Bacon, RN Phone Number: 02/22/2023, 8:42 AM  Clinical Narrative:   Noted that patient has discharge orders today with disposition for SNF. Call to patient to discuss SNF recommendations. Patient feels she needs to go to SNF instead of using [facility] services. Secure message to  CSW to notify that patient has SNF recommendations and disposition.    Expected Discharge Plan: Home w Home Health Services (vs SNF) Barriers to Discharge: Continued Medical Work up  Expected Discharge Plan and Services   Discharge Planning Services: CM Consult   Living arrangements for the past 2 months: Mobile Home Expected Discharge Date: 02/22/23                         HH Arranged: PT, OT HH Agency: [facility] Date [facility] Agency Contacted: 02/18/23 Time HH Agency Contacted: 1226 Representative spoke with at [facility] Agency: Amy   Social Determinants of Health (SDOH) Interventions SDOH Screenings   Food Insecurity: Patient Declined (02/18/2023)  Housing: High Risk (02/18/2023)  Transportation Needs: No Transportation Needs (02/18/2023)  Utilities: Not At Risk (02/18/2023)  Tobacco Use: High Risk (02/18/2023)    Readmission Risk Interventions    10/09/2020   11:28 AM 09/20/2020    2:01 PM  Readmission Risk Prevention Plan  Medication Screening  Complete  Transportation Screening Complete Complete  PCP or Specialist Appt within 5-7 Days Complete   Home Care Screening Complete   Medication Review (RN CM) Complete

## 2023-02-22 NOTE — Progress Notes (Signed)
Orthopaedic Trauma Progress Note  SUBJECTIVE: Doing well today, pain controlled. Has not had any additional issues with oozing or drainage from her incisions. No chest pain. No SOB. No nausea/vomiting. No other complaints.  Patient agreeable to SNF  OBJECTIVE:  Vitals:   02/22/23 0600 02/22/23 0748  BP: (!) 102/52 115/62  Pulse:  74  Resp: 18 20  Temp: 98.2 F (36.8 C) 97.6 F (36.4 C)  SpO2: 98% 100%    General: Sitting up in bed, no acute distress.  Pleasant and cooperative Respiratory: No increased work of breathing.  RLE: Dressings over the hip and proximal thigh are stable with no drainage noted.  No significant tenderness with palpation throughout the thigh.  No calf tenderness.  Ankle dorsiflexion/plantarflexion intact. + EHL/FHL.  Compartment soft compressible.  Neurovascularly intact  IMAGING: Stable post op imaging.   LABS:  Results for orders placed or performed during the hospital encounter of 02/17/23 (from the past 24 hour(s))  CBC     Status: Abnormal   Collection Time: 02/21/23 10:23 AM  Result Value Ref Range   WBC 8.5 4.0 - 10.5 K/uL   RBC 2.54 (L) 3.87 - 5.11 MIL/uL   Hemoglobin 8.0 (L) 12.0 - 15.0 g/dL   HCT 69.6 (L) 29.5 - 28.4 %   MCV 95.7 80.0 - 100.0 fL   MCH 31.5 26.0 - 34.0 pg   MCHC 32.9 30.0 - 36.0 g/dL   RDW 13.2 44.0 - 10.2 %   Platelets 215 150 - 400 K/uL   nRBC 0.0 0.0 - 0.2 %  Basic metabolic panel     Status: Abnormal   Collection Time: 02/21/23 10:23 AM  Result Value Ref Range   Sodium 139 135 - 145 mmol/L   Potassium 3.9 3.5 - 5.1 mmol/L   Chloride 104 98 - 111 mmol/L   CO2 28 22 - 32 mmol/L   Glucose, Bld 107 (H) 70 - 99 mg/dL   BUN 9 8 - 23 mg/dL   Creatinine, Ser 7.25 0.44 - 1.00 mg/dL   Calcium 8.9 8.9 - 36.6 mg/dL   GFR, Estimated >44 >03 mL/min   Anion gap 7 5 - 15    ASSESSMENT: Kendra Lee is a 67 y.o. female, 4 Days Post-Op s/p INTRAMEDULLARY NAIL RIGHT INTERTROCHANTERIC FEMUR FRACTURE by Dr. Carola Frost  CV/Blood loss:  Acute blood loss anemia, Hgb 8.0 on 02/21/2023.  CBC pending this morning.  Blood pressure slightly soft but remains stable.    PLAN: Weightbearing: WBAT RLE ROM: Unrestricted ROM Incisional and dressing care: Daily wound care as needed.  Okay to reinforce dressings and change dressings as needed at this point.   Showering: Okay to get incisions wet if no drainage Orthopedic device(s): None  Pain management: Continue multimodal pain control.  Minimize narcotics as able. VTE prophylaxis: Plavix on hold until discharge. SCDs ID: Perioperative ABX completed Foley/Lines:  No foley, KVO IVFs Impediments to Fracture Healing: Osteoporosis/fragility fracture.  Nicotine dependence. Dispo: PT/OT evaluations ongoing.  Recommending SNF.  TOC following for placement.  Okay for discharge from ortho standpoint once cleared by medicine team and therapies  D/C recommendations: -Hydrocodone for pain control -Resume Plavix for DVT prophylaxis  Follow - up plan: 10 to 14 days after discharge with Dr. Carola Frost for wound check and repeat x-rays   Contact information:  Truitt Merle MD, Thyra Breed PA-C. After hours and holidays please check [email or web address] for group call information for [facility]   Thompson Caul, PA-C [phone] (office)  [email or web address]

## 2023-02-22 NOTE — [place] FL2 (Signed)
[place] MEDICAID FL2 LEVEL OF CARE FORM     IDENTIFICATION  Patient Name: Kendra Lee Birthdate: [birth year] Sex: female Admission Date (Current Location): 02/17/2023  [facility] and [place] Number:  Producer, television/film/video and Address:  [facility] [place], [address] [place], [place] [address]      Provider Number: [id number]  Attending Physician Name and Address:  Susa Griffins, MD  Relative Name and Phone Number:  Ramon Dredge (son) [phone]    Current Level of Care: Hospital Recommended Level of Care: Skilled Nursing Facility Prior Approval Number:    Date Approved/Denied:   PASRR Number: [id number]  Discharge Plan: SNF    Current Diagnoses: Patient Active Problem List   Diagnosis Date Noted   Dyslipidemia 02/18/2023   Hyponatremia 02/18/2023   Malnutrition of moderate degree 02/18/2023   Closed right hip fracture (HCC) 02/17/2023   SBO (small bowel obstruction) (HCC) 10/04/2022   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   Vitamin D deficiency 10/02/2020   Dental caries 10/02/2020   Dysphagia 10/02/2020   Hypokalemia 10/02/2020   Acute metabolic encephalopathy 09/18/2020   CVA (cerebral vascular accident) (HCC) 09/18/2020   Leukocytosis 09/18/2020   Essential hypertension 09/18/2020    Orientation RESPIRATION BLADDER Height & Weight     Self, Time, Situation, Place  O2 (Nasal Cannula 3 liters) Incontinent Weight: 93 lb (42.2 kg) Height:  5\' 1"  (154.9 cm)  BEHAVIORAL SYMPTOMS/MOOD NEUROLOGICAL BOWEL NUTRITION STATUS      Continent (WDL) Diet (Please see discharge summary)  AMBULATORY STATUS COMMUNICATION OF NEEDS Skin   Extensive Assist Verbally Other (Comment) (Wound/Incision LDAs,Incision Closed,Hip,R)                       Personal Care Assistance Level of Assistance  Bathing, Feeding, Dressing Bathing Assistance: Maximum assistance Feeding assistance: Independent Dressing Assistance: Maximum assistance      Functional Limitations Info  Sight, Hearing, Speech Sight Info: Impaired Financial trader) Hearing Info: Adequate Speech Info: Adequate    SPECIAL CARE FACTORS FREQUENCY  PT (By licensed PT), OT (By licensed OT)     PT Frequency: 5x min weekly OT Frequency: 5x min weekly            Contractures Contractures Info: Not present    Additional Factors Info  Code Status, Allergies, Psychotropic Code Status Info: FULL Allergies Info: NKA Psychotropic Info: mirtazapine (REMERON) tablet 7.5 mg daily at bedtime         Current Medications (02/22/2023):  This is the current hospital active medication list Current Facility-Administered Medications  Medication Dose Route Frequency Provider Last Rate Last Admin   acetaminophen (TYLENOL) tablet 650 mg  650 mg Oral Q4H PRN Montez Morita, PA-C       amLODipine (NORVASC) tablet 10 mg  10 mg Oral Daily Vasireddy, Clerance Lav, MD   10 mg at 02/22/23 0900   atorvastatin (LIPITOR) tablet 80 mg  80 mg Oral Daily Montez Morita, PA-C   80 mg at 02/22/23 0901   azithromycin (ZITHROMAX) tablet 500 mg  500 mg Oral Daily Susa Griffins, MD   500 mg at 02/22/23 0901   Chlorhexidine Gluconate Cloth 2 % PADS 6 each  6 each Topical Daily Susa Griffins, MD   6 each at 02/22/23 0912   diphenhydrAMINE (BENADRYL) capsule 25 mg  25 mg Oral Q6H PRN Montez Morita, PA-C       docusate sodium (COLACE) capsule 100 mg  100 mg Oral BID Montez Morita, PA-C  100 mg at 02/22/23 0901   HYDROcodone-acetaminophen (NORCO/VICODIN) 5-325 MG per tablet 1-2 tablet  1-2 tablet Oral Q6H PRN Montez Morita, PA-C   2 tablet at 02/22/23 0252   ipratropium-albuterol (DUONEB) 0.5-2.5 (3) MG/3ML nebulizer solution 3 mL  3 mL Nebulization BID Susa Griffins, MD       lactose free nutrition (BOOST PLUS) liquid 237 mL  237 mL Oral BID BM Susa Griffins, MD   237 mL at 02/22/23 0911   losartan (COZAAR) tablet 100 mg  100 mg Oral Daily Montez Morita, PA-C   100 mg at 02/22/23 0900   magnesium  hydroxide (MILK OF MAGNESIA) suspension 30 mL  30 mL Oral Daily PRN Montez Morita, PA-C       menthol-cetylpyridinium (CEPACOL) lozenge 3 mg  1 lozenge Oral PRN Montez Morita, PA-C       Or   phenol (CHLORASEPTIC) mouth spray 1 spray  1 spray Mouth/Throat PRN Montez Morita, PA-C       methylPREDNISolone sodium succinate (SOLU-MEDROL) 125 mg/2 mL injection 80 mg  80 mg Intravenous Q24H Susa Griffins, MD   80 mg at 02/22/23 0901   metoCLOPramide (REGLAN) tablet 5-10 mg  5-10 mg Oral Q8H PRN Montez Morita, PA-C       Or   metoCLOPramide (REGLAN) injection 5-10 mg  5-10 mg Intravenous Q8H PRN Montez Morita, PA-C       mirtazapine (REMERON) tablet 7.5 mg  7.5 mg Oral QHS Montez Morita, PA-C   7.5 mg at 02/21/23 2023   morphine (PF) 2 MG/ML injection 2 mg  2 mg Intravenous Q4H PRN Susa Griffins, MD   2 mg at 02/21/23 1020   multivitamin with minerals tablet 1 tablet  1 tablet Oral Daily Susa Griffins, MD   1 tablet at 02/22/23 0901   ondansetron (ZOFRAN) injection 4 mg  4 mg Intravenous Q4H PRN Montez Morita, PA-C       potassium chloride SA (KLOR-CON M) CR tablet 20 mEq  20 mEq Oral Daily Montez Morita, PA-C   20 mEq at 02/22/23 0901   thiamine (VITAMIN B1) tablet 100 mg  100 mg Oral Daily Montez Morita, PA-C   100 mg at 02/22/23 0901   traZODone (DESYREL) tablet 25 mg  25 mg Oral QHS PRN Montez Morita, PA-C   25 mg at 02/20/23 2109     Discharge Medications: Please see discharge summary for a list of discharge medications.  Relevant Imaging Results:  Relevant Lab Results:   Additional Information [id number]-[id number]  Delilah Shan, LCSWA

## 2023-02-22 NOTE — TOC Initial Note (Signed)
Transition of Care [facility]) - Initial/Assessment Note    Patient Details  Name: Kendra Lee MRN: [id number] Date of Birth: [birth year]  Transition of Care [facility]) CM/SW Contact:    Delilah Shan, LCSWA Phone Number: 02/22/2023, 11:11 AM  Clinical Narrative:                  CSW received consult for possible SNF placement at time of discharge. CSW spoke with patient regarding PT recommendation of SNF placement at time of discharge. Patient reports she comes from home alone. Patient expressed understanding of PT recommendation and is agreeable to SNF placement at time of discharge. Patient gave CSW permission to fax out initial referral for possible SNF placement.CSW discussed insurance authorization process.  No further questions reported at this time. CSW to continue to follow and assist with discharge planning needs.   Expected Discharge Plan: Skilled Nursing Facility Barriers to Discharge: Continued Medical Work up   Patient Goals and CMS Choice Patient states their goals for this hospitalization and ongoing recovery are:: SNF CMS Medicare.gov Compare Post Acute Care list provided to:: Patient Choice offered to / list presented to : Patient      Expected Discharge Plan and Services In-house Referral: Clinical Social Work Discharge Planning Services: CM Consult   Living arrangements for the past 2 months: Mobile Home Expected Discharge Date: 02/22/23                         HH Arranged: PT, OT HH Agency: [facility] Date [facility] Agency Contacted: 02/18/23 Time HH Agency Contacted: 1226 Representative spoke with at [facility] Agency: Amy  Prior Living Arrangements/Services Living arrangements for the past 2 months: Mobile Home Lives with:: Self Patient language and need for interpreter reviewed:: Yes Do you feel safe going back to the place where you live?: No   SNF  Need for Family Participation in Patient Care: Yes (Comment) Care giver support system in place?: Yes  (comment) Current home services: DME (cane, W/C, RW) Criminal Activity/Legal Involvement Pertinent to Current Situation/Hospitalization: No - Comment as needed  Activities of Daily Living   ADL Screening (condition at time of admission) Independently performs ADLs?: Yes (appropriate for developmental age) (prior to fall / injury) Is the patient deaf or have difficulty hearing?: No Does the patient have difficulty seeing, even when wearing glasses/contacts?: No Does the patient have difficulty concentrating, remembering, or making decisions?: No  Permission Sought/Granted Permission sought to share information with : Case Manager, Magazine features editor, Family Supports Permission granted to share information with : Yes, Verbal Permission Granted  Share Information with NAME: Ramon Dredge  Permission granted to share info w AGENCY: SNF  Permission granted to share info w Relationship: son  Permission granted to share info w Contact Information: Ramon Dredge [phone]  Emotional Assessment Appearance:: Appears stated age Attitude/Demeanor/Rapport: Gracious Affect (typically observed): Calm Orientation: : Oriented to Self, Oriented to Place, Oriented to  Time, Oriented to Situation Alcohol / Substance Use: Not Applicable Psych Involvement: No (comment)  Admission diagnosis:  Closed right hip fracture (HCC) [S72.001A] Closed fracture of right hip, initial encounter (HCC) [S72.001A] Patient Active Problem List   Diagnosis Date Noted   Dyslipidemia 02/18/2023   Hyponatremia 02/18/2023   Malnutrition of moderate degree 02/18/2023   Closed right hip fracture (HCC) 02/17/2023   SBO (small bowel obstruction) (HCC) 10/04/2022   Gastrostomy in place [facility]) 10/04/2022   Vitamin D deficiency 10/02/2020   Dental caries 10/02/2020   Dysphagia  10/02/2020   Hypokalemia 10/02/2020   Acute metabolic encephalopathy 09/18/2020   CVA (cerebral vascular accident) (HCC) 09/18/2020   Leukocytosis  09/18/2020   Essential hypertension 09/18/2020   PCP:  Benetta Spar, MD Pharmacy:   [facility] [address], [place] - [address] [place] [address] Phone: [phone] Fax: [phone]  [id number] - [place], [place] - [address] [place] [address] Phone: [phone] Fax: [phone]     Social Determinants of Health (SDOH) Social History: SDOH Screenings   Food Insecurity: Patient Declined (02/18/2023)  Housing: High Risk (02/18/2023)  Transportation Needs: No Transportation Needs (02/18/2023)  Utilities: Not At Risk (02/18/2023)  Tobacco Use: High Risk (02/18/2023)   SDOH Interventions:     Readmission Risk Interventions    10/09/2020   11:28 AM 09/20/2020    2:01 PM  Readmission Risk Prevention Plan  Medication Screening  Complete  Transportation Screening Complete Complete  PCP or Specialist Appt within 5-7 Days Complete   Home Care Screening Complete   Medication Review (RN CM) Complete

## 2023-02-22 NOTE — Plan of Care (Signed)
  Problem: Safety: Goal: Ability to remain free from injury will improve Outcome: Progressing   Problem: Skin Integrity: Goal: Risk for impaired skin integrity will decrease Outcome: Progressing   Problem: Education: Goal: Verbalization of understanding the information provided (i.e., activity precautions, restrictions, etc) will improve Outcome: Progressing   Problem: Activity: Goal: Ability to ambulate and perform ADLs will improve Outcome: Progressing   Problem: Clinical Measurements: Goal: Postoperative complications will be avoided or minimized Outcome: Progressing   Problem: Clinical Measurements: Goal: Postoperative complications will be avoided or minimized Outcome: Progressing   Problem: Pain Management: Goal: Pain level will decrease Outcome: Progressing

## 2023-02-22 NOTE — Discharge Summary (Signed)
Physician Discharge Summary   Patient: Kendra Lee MRN: [id number] DOB: 04/25/1955  Admit date:     02/17/2023  Discharge date: 02/22/23  Discharge Physician: [id number]   PCP: Benetta Spar, MD   Recommendations at discharge:   Follow-up with orthopedic surgery in 10 days Follow-up with primary care physician in 1 week Discharge Diagnoses: Principal Problem:   Closed right hip fracture (HCC) Active Problems:   Hypokalemia   Hyponatremia   Essential hypertension   Dyslipidemia   Malnutrition of moderate degree  Resolved Problems:   * No resolved hospital problems. *  Hospital Course: No notes on file  Assessment and Plan:  67 year old female with history of CVA, hypertension, ongoing tobacco abuse comes to the emergency department with acute onset of right hip pain while patient was working on her fireplace bending lost her balance fell on her right side, unable to ambulate.  Denied any loss of consciousness.  Workup in the emergency department patient was found to have potassium of 3.3, sodium 134, elevated white blood cell count of 16,000.  EKG showed normal sinus rhythm.  X-ray of the hip showed nondisplaced right femoral intertrochanteric fracture.  Underwent intramedullary nailing on 02/18/2023.  Postsurgery patient developed bleeding at the surgical site, discontinued Plavix, applied pressure dressing with improvement of the bleeding.  The patient had acute blood loss anemia hemoglobin trended down to from 11.1 at the time of the admission to 7.2.  Patient is given IV iron, hemoglobin remained stable.  Evaluated by physical therapy, recommended SNF placement.  Patient also had COPD exacerbation with cough  congested, shortness of breath.  Patient is given DuoNebs, azithromycin, Solu-Medrol.  Electrolyte imbalances were corrected.  Patient is recommended to follow-up with orthopedic surgery 2 weeks from the time of the admission, follow-up with primary care  physician in 1 week.   Closed right intertrochanteric fracture -Continue with the pain management as needed -Orthopedic surgery is consulted-s/p intramedullary nailing 11/26 -Bleeding at the surgical site, applied pressure dressing. -Holding Plavix -Physical therapy, Occupational Therapy-recommended SNF.   Acute blood loss anemia -Admitted with a hemoglobin of 11.1-trended down to 8.6-7.5-7.2 -Closely follow-up with CBC -Keep the patient on IV iron   COPD exacerbation -Patient has significant congested cough -Keep the patient on DuoNebs, azithromycin, Solu-Medrol   Hypokalemia -Replaced by mouth. 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 -Resolved   Hypertension accelerated -Increase the dose of amlodipine to 10 mg daily -Well-controlled   CVA -Holding Plavix for now, continue with the statin.   Hyperlipidemia -Continue statin.      Consultants: Orthopedic surgery Procedures performed: Right hip intramedullary nailing Disposition: SNF Diet recommendation:  Discharge Diet Orders (From admission, onward)     Start     Ordered   02/22/23 0000  Diet - low sodium heart healthy        02/22/23 0818   02/22/23 0000  Diet general        02/22/23 0818           Diet cardiac DISCHARGE MEDICATION: Allergies as of 02/22/2023   No Known Allergies      Medication List     STOP taking these medications    diphenhydrAMINE 25 mg capsule Commonly known as: BENADRYL   losartan-hydrochlorothiazide 100-12.5 MG tablet Commonly known as: HYZAAR   potassium chloride SA 20 MEQ tablet Commonly known as: KLOR-CON M       TAKE these medications    acetaminophen 650 MG CR tablet  Commonly known as: TYLENOL Take 1,300 mg by mouth every 8 (eight) hours as needed for pain. What changed: Another medication with the same name was removed. Continue taking this medication, and follow the directions you see here.   alendronate 70 MG  tablet Commonly known as: FOSAMAX Take 70 mg by mouth once a week.   amLODipine 10 MG tablet Commonly known as: NORVASC Take 1 tablet (10 mg total) by mouth daily. What changed:  medication strength how much to take   aspirin 81 MG chewable tablet Chew 1 tablet (81 mg total) by mouth daily.   atorvastatin 80 MG tablet Commonly known as: LIPITOR Take 80 mg by mouth daily.   azithromycin 250 MG tablet Commonly known as: ZITHROMAX COPD exacerbation   clopidogrel 75 MG tablet Commonly known as: PLAVIX Take 75 mg by mouth daily.   docusate sodium 100 MG capsule Commonly known as: COLACE Take 100 mg by mouth 2 (two) times daily.   HYDROcodone-acetaminophen 5-325 MG tablet Commonly known as: NORCO/VICODIN Take 1 tablet by mouth every 6 (six) hours as needed for up to 3 days for moderate pain (pain score 4-6).   ipratropium-albuterol 0.5-2.5 (3) MG/3ML Soln Commonly known as: DUONEB Take 3 mLs by nebulization every 6 (six) hours.   losartan 100 MG tablet Commonly known as: COZAAR Take 1 tablet (100 mg total) by mouth daily.   mirtazapine 7.5 MG tablet Commonly known as: REMERON Take 7.5 mg by mouth at bedtime.   predniSONE 20 MG tablet Commonly known as: DELTASONE Take 1 tablet (20 mg total) by mouth daily with breakfast for 5 days.   thiamine 100 MG tablet Commonly known as: VITAMIN B1 Take 1 tablet (100 mg total) by mouth daily.        Follow-up Information     Myrene Galas, MD. Schedule an appointment as soon as possible for a visit in 10 day(s).   Specialty: Orthopedic Surgery Contact information: [address] [place] [address] [phone]                Discharge Exam: Ceasar Mons Weights   02/17/23 1946 02/18/23 1456  Weight: 42.2 kg 42.2 kg   GENERAL:  67 y.o.-year-old Caucasian thin built female patient lying in the bed with no acute distress.  EYES: Pupils equal, round, reactive to light and accommodation. No scleral icterus.  Extraocular muscles intact.  HEENT: Head atraumatic, normocephalic. Oropharynx and nasopharynx clear.  NECK:  Supple, no jugular venous distention. No thyroid enlargement, no tenderness.  LUNGS: Normal breath sounds bilaterally, no wheezing, rales,rhonchi or crepitation. No use of accessory muscles of respiration.  CARDIOVASCULAR: Regular rate and rhythm, S1, S2 normal. No murmurs, rubs, or gallops.  ABDOMEN: Soft, nondistended, nontender. Bowel sounds present. No organomegaly or mass.  EXTREMITIES: No pedal edema, cyanosis, or clubbing.  NEUROLOGIC: Cranial nerves II through XII are intact. Muscle strength 5/5 in all extremities. Sensation intact. Gait not checked.  PSYCHIATRIC: The patient is alert and oriented x 3.  Normal affect and good eye contact. SKIN: No obvious rash, lesion, or ulcer.  Musculoskeletal: No further episodes of bleeding  Condition at discharge: Stable  The results of significant diagnostics from this hospitalization (including imaging, microbiology, ancillary and laboratory) are listed below for reference.   Imaging Studies: DG Swallowing Func-Speech Pathology  Result Date: 02/19/2023 Table formatting from the original result was not included. Modified Barium Swallow Study Patient Details Name: Kendra Lee MRN: [id number] Date of Birth: 1955/12/10 Today's Date: 02/19/2023 HPI/PMH: HPI:  Patient is a 67 y.o. female with PMH: CVA (2022; with resultant dysphagia requiring PEG and modified diet) HTN, ongoing tobacco abuse. She presented to the hospital on 02/17/2023 with acute onset of right hip pain after she was bending over to work on her fireplace, lost her balance and fell on her right side. She denied LOC. She underwent IM nail of right hip on 02/19/23. Clinical Impression: Clinical Impression: Patient presents with a mild oropharyngeal dysphagia as per this modified barium swallow study. As compared to 2022 MBS images as well as SLP's clinical impression statement at  that time, patient's dysphagia does not appear to have signifcantly changed. She continues with decreased lingual ROM and strength, leading to delayed anterior to posterior transit of liquid and solid boluses as well as reduced laryngeal elevation and laryngeal vestibule closure, resulting in penetration of thin liquids above vocal cords (PAS 3) and instances of penetration to the vocal cords (PAS 4, 5) and penetration (PAS 2) with nectar thick liquids. Swallow initiated at level of pyriform sinus with thin and nectar thick liquids but initiated at level of vallecular sinus with puree solids and honey thick liquids. Only trace residuals on lingual surface, palate and base of tongue visualized s/p initial swallows but with eventual full clearance. No instances of aspiration observed. All consistencies transited through PES without difficulty and without residuals or retrograde flow at or below PES. SLP educated patient as to results and recommended she continue with the swallow safety precautions she has already been using at home. No further skilled SLP warranted at this time. Factors that may increase risk of adverse event in presence of aspiration Kendra Lee & Clearance Coots 2021): Factors that may increase risk of adverse event in presence of aspiration Kendra Lee & Clearance Coots 2021): Weak cough; Poor general health and/or compromised immunity Recommendations/Plan: Swallowing Evaluation Recommendations Swallowing Evaluation Recommendations Recommendations: PO diet PO Diet Recommendation: Dysphagia 3 (Mechanical soft); Thin liquids (Level 0) Liquid Administration via: Cup; Straw Medication Administration: Whole meds with puree Supervision: Patient able to self-feed Swallowing strategies  : Slow rate; Small bites/sips Postural changes: Position pt fully upright for meals; Stay upright 30-60 min after meals Oral care recommendations: Oral care BID (2x/day); Pt independent with oral care Treatment Plan Treatment Plan Treatment  recommendations: No treatment recommended at this time Follow-up recommendations: No SLP follow up Recommendations Comment: TBD following MBS Functional status assessment: Patient has not had a recent decline in their functional status. Recommendations Recommendations for follow up therapy are one component of a multi-disciplinary discharge planning process, led by the attending physician.  Recommendations may be updated based on patient status, additional functional criteria and insurance authorization. Assessment: Orofacial Exam: Orofacial Exam Oral Cavity: Oral Hygiene: WFL Oral Cavity - Dentition: Poor condition; Missing dentition Orofacial Anatomy: WFL Oral Motor/Sensory Function: WFL Anatomy: Anatomy: WFL Boluses Administered: Boluses Administered Boluses Administered: Thin liquids (Level 0); Mildly thick liquids (Level 2, nectar thick); Moderately thick liquids (Level 3, honey thick); Puree  Oral Impairment Domain: Oral Impairment Domain Lip Closure: No labial escape Tongue control during bolus hold: Not tested Bolus preparation/mastication: Slow prolonged chewing/mashing with complete recollection Bolus transport/lingual motion: Slow tongue motion Oral residue: Residue collection on oral structures Location of oral residue : Tongue; Palate Initiation of pharyngeal swallow : Pyriform sinuses  Pharyngeal Impairment Domain: Pharyngeal Impairment Domain Soft palate elevation: No bolus between soft palate (SP)/pharyngeal wall (PW) Laryngeal elevation: Partial superior movement of thyroid cartilage/partial approximation of arytenoids to epiglottic petiole Anterior hyoid excursion: Complete anterior movement Epiglottic  movement: Complete inversion Laryngeal vestibule closure: Incomplete, narrow column air/contrast in laryngeal vestibule Pharyngeal stripping wave : Present - complete Pharyngoesophageal segment opening: Complete distension and complete duration, no obstruction of flow Tongue base retraction: Trace  column of contrast or air between tongue base and PPW Pharyngeal residue: Complete pharyngeal clearance Location of pharyngeal residue: N/A  Esophageal Impairment Domain: Esophageal Impairment Domain Esophageal clearance upright position: Complete clearance, esophageal coating Pill: No data recorded Penetration/Aspiration Scale Score: Penetration/Aspiration Scale Score 1.  Material does not enter airway: Moderately thick liquids (Level 3, honey thick); Puree 2.  Material enters airway, remains ABOVE vocal cords then ejected out: Mildly thick liquids (Level 2, nectar thick) 3.  Material enters airway, remains ABOVE vocal cords and not ejected out: Thin liquids (Level 0) 4.  Material enters airway, CONTACTS cords then ejected out: Thin liquids (Level 0) 5.  Material enters airway, CONTACTS cords and not ejected out: Thin liquids (Level 0) Compensatory Strategies: Compensatory Strategies Compensatory strategies: No   General Information: Caregiver present: No  Diet Prior to this Study: Dysphagia 3 (mechanical soft); Thin liquids (Level 0)   Temperature : Normal   Respiratory Status: WFL   Supplemental O2: None (Room air)   History of Recent Intubation: Yes  Behavior/Cognition: Alert; Cooperative; Pleasant mood Self-Feeding Abilities: Able to self-feed Baseline vocal quality/speech: Normal Volitional Cough: Able to elicit Volitional Swallow: Able to elicit Exam Limitations: No limitations Goal Planning: No data recorded No data recorded No data recorded Patient/Family Stated Goal: return home Consulted and agree with results and recommendations: Patient Pain: Pain Assessment Pain Assessment: No/denies pain End of Session: Start Time:SLP Start Time (ACUTE ONLY): 0955 Stop Time: SLP Stop Time (ACUTE ONLY): 1010 Time Calculation:SLP Time Calculation (min) (ACUTE ONLY): 15 min Charges: SLP Evaluations $ SLP Speech Visit: 1 Visit SLP Evaluations $BSS Swallow: 1 Procedure $MBS Swallow: 1 Procedure SLP visit diagnosis: SLP  Visit Diagnosis: Dysphagia, oropharyngeal phase (R13.12) Past Medical History: Past Medical History: Diagnosis Date  CVA (cerebral vascular accident) (HCC)   Dysphagia   Hypertension   Hypokalemia   Smoker   2 ppd x 55 years Past Surgical History: Past Surgical History: Procedure Laterality Date  ABDOMINAL HYSTERECTOMY    late 1980s  INTRAMEDULLARY (IM) NAIL INTERTROCHANTERIC Right 02/18/2023  Procedure: INTRAMEDULLARY (IM) NAIL INTERTROCHANTERIC;  Surgeon: Myrene Galas, MD;  Location: [facility] OR;  Service: Orthopedics;  Laterality: Right;  IR GASTROSTOMY TUBE MOD SED  09/22/2020  IR GASTROSTOMY TUBE MOD SED  10/04/2020 Angela Nevin, MA, CCC-SLP Speech Therapy   DG HIP UNILAT W OR W/O PELVIS 2-3 VIEWS RIGHT  Result Date: 02/18/2023 CLINICAL DATA:  Right femoral intertrochanteric fracture. EXAM: DG HIP (WITH OR WITHOUT PELVIS) 2-3V RIGHT COMPARISON:  Radiograph dated 02/17/2023. FINDINGS: Status post ORIF of right femoral neck fracture. The hardware is intact. The soft tissues are unremarkable. Vascular calcifications noted. IMPRESSION: Status post ORIF of right femoral neck fracture. Electronically Signed   By: Elgie Collard M.D.   On: 02/18/2023 22:52   DG HIP UNILAT WITH PELVIS 2-3 VIEWS RIGHT  Result Date: 02/18/2023 CLINICAL DATA:  Elective surgery EXAM: DG HIP (WITH OR WITHOUT PELVIS) 2-3V RIGHT COMPARISON:  Right hip x-ray 02/17/2023 FINDINGS: Six intraoperative fluoroscopic views of the right hip were submitted. Right hip screw and short femoral nail were placed fixating intratrochanteric fracture. Alignment is anatomic. Fluoroscopy time: 52 seconds. Fluoroscopy dose: 6.26 micro gray. IMPRESSION: Intraoperative fluoroscopic views of the right hip. Electronically Signed   By: Mcneil Sober.D.  On: 02/18/2023 22:06   DG C-Arm 1-60 Min-No Report  Result Date: 02/18/2023 Fluoroscopy was utilized by the requesting physician.  No radiographic interpretation.   DG C-Arm 1-60 Min-No  Report  Result Date: 02/18/2023 Fluoroscopy was utilized by the requesting physician.  No radiographic interpretation.   DG Pelvis Portable  Result Date: 02/17/2023 CLINICAL DATA:  Check for pelvic fracture after fall EXAM: PORTABLE PELVIS 1-2 VIEWS COMPARISON:  Femur radiographs earlier today FINDINGS: Redemonstrated right intertrochanteric femur fracture. No pelvic fracture. No diastasis. Degenerative changes pubic symphysis, both hips, SI joints and lower lumbar spine. IMPRESSION: Redemonstrated right intertrochanteric fracture. No additional fractures. Electronically Signed   By: Minerva Fester M.D.   On: 02/17/2023 22:15   DG FEMUR, MIN 2 VIEWS RIGHT  Result Date: 02/17/2023 CLINICAL DATA:  Fall EXAM: RIGHT FEMUR 2 VIEWS COMPARISON:  Right hip x-ray 04/16/2005. FINDINGS: The bones are osteopenic. There is a nondisplaced right femoral intratrochanteric fracture. There is no dislocation. Peripheral vascular calcifications are present. IMPRESSION: Nondisplaced right femoral intratrochanteric fracture. Electronically Signed   By: Darliss Cheney M.D.   On: 02/17/2023 21:20    Microbiology: Results for orders placed or performed during the hospital encounter of 02/17/23  Surgical pcr screen     Status: None   Collection Time: 02/18/23  1:01 AM   Specimen: Nasal Mucosa; Nasal Swab  Result Value Ref Range Status   MRSA, PCR NEGATIVE NEGATIVE Final   Staphylococcus aureus NEGATIVE NEGATIVE Final    Comment: (NOTE) The Xpert SA Assay (FDA approved for NASAL specimens in patients 13 years of age and older), is one component of a comprehensive surveillance program. It is not intended to diagnose infection nor to guide or monitor treatment. Performed at [facility], [address] [place], [place] [address]     Labs: CBC: Recent Labs  Lab 02/17/23 2028 02/18/23 0655 02/19/23 0603 02/19/23 2334 02/20/23 0519 02/21/23 1023  WBC 16.4* 9.8 9.9  --  10.6* 8.5  NEUTROABS 15.0*   --   --   --   --   --   HGB 12.4 11.8* 8.6* 7.5* 7.2* 8.0*  HCT 36.4 35.7* 26.3* 23.1* 22.5* 24.3*  MCV 94.3 93.9 96.7  --  94.9 95.7  PLT 306 266 198  --  178 215   Basic Metabolic Panel: Recent Labs  Lab 02/18/23 0655 02/19/23 0603 02/20/23 0519 02/21/23 0604 02/21/23 1023  NA 138 138 134* 138 139  K 3.7 3.8 3.7 3.8 3.9  CL 105 106 105 105 104  CO2 25 21* 25 26 28   GLUCOSE 85 75 122* 98 107*  BUN 7* 8 10 10 9   CREATININE 0.72 0.90 0.60 0.61 0.56  CALCIUM 8.6* 7.8* 8.2* 8.6* 8.9   Liver Function Tests: Recent Labs  Lab 02/17/23 2028  AST 22  ALT 24  ALKPHOS 46  BILITOT 0.7  PROT 6.7  ALBUMIN 3.9   CBG: No results for input(s): "GLUCAP" in the last 168 hours.  Discharge time spent: greater than 30 minutes.  SignedSusa Griffins, MD [facility] 02/22/2023

## 2023-02-22 NOTE — Plan of Care (Signed)
  Problem: Education: Goal: Knowledge of General Education information will improve Description Including pain rating scale, medication(s)/side effects and non-pharmacologic comfort measures Outcome: Progressing   

## 2023-02-23 DIAGNOSIS — D62 Acute posthemorrhagic anemia: Secondary | ICD-10-CM | POA: Diagnosis not present

## 2023-02-23 DIAGNOSIS — S72001A Fracture of unspecified part of neck of right femur, initial encounter for closed fracture: Secondary | ICD-10-CM | POA: Diagnosis not present

## 2023-02-23 LAB — CBC
HCT: 22.4 % — ABNORMAL LOW (ref 36.0–46.0)
HCT: 27.8 % — ABNORMAL LOW (ref 36.0–46.0)
Hemoglobin: 7 g/dL — ABNORMAL LOW (ref 12.0–15.0)
Hemoglobin: 9.3 g/dL — ABNORMAL LOW (ref 12.0–15.0)
MCH: 30.6 pg (ref 26.0–34.0)
MCH: 30.9 pg (ref 26.0–34.0)
MCHC: 31.3 g/dL (ref 30.0–36.0)
MCHC: 33.5 g/dL (ref 30.0–36.0)
MCV: 97.8 fL (ref 80.0–100.0)
MCV: 92.4 fL (ref 80.0–100.0)
Platelets: 264 10*3/uL (ref 150–400)
Platelets: 266 10*3/uL (ref 150–400)
RBC: 2.29 MIL/uL — ABNORMAL LOW (ref 3.87–5.11)
RBC: 3.01 MIL/uL — ABNORMAL LOW (ref 3.87–5.11)
RDW: 13.5 % (ref 11.5–15.5)
RDW: 15.5 % (ref 11.5–15.5)
WBC: 10 10*3/uL (ref 4.0–10.5)
WBC: 11.8 10*3/uL — ABNORMAL HIGH (ref 4.0–10.5)
nRBC: 0.2 % (ref 0.0–0.2)
nRBC: 0.2 % (ref 0.0–0.2)

## 2023-02-23 LAB — ABO/RH: ABO/RH(D): A POS

## 2023-02-23 LAB — PREPARE RBC (CROSSMATCH)

## 2023-02-23 MED ORDER — PREDNISONE 20 MG PO TABS
30.0000 mg | ORAL_TABLET | Freq: Every day | ORAL | Status: DC
  Administered 2023-02-23 – 2023-02-25 (×3): 30 mg via ORAL
  Filled 2023-02-23 (×3): qty 1

## 2023-02-23 MED ORDER — CLOPIDOGREL BISULFATE 75 MG PO TABS: 75.0000 mg | ORAL_TABLET | Freq: Every day | ORAL | Status: DC

## 2023-02-23 MED ORDER — PREDNISONE 10 MG PO TABS: ORAL_TABLET | ORAL | 0 refills | Status: DC

## 2023-02-23 MED ORDER — POLYETHYLENE GLYCOL 3350 17 G PO PACK
17.0000 g | PACK | Freq: Two times a day (BID) | ORAL | Status: AC
  Administered 2023-02-23 – 2023-02-24 (×4): 17 g via ORAL
  Filled 2023-02-23 (×4): qty 1

## 2023-02-23 MED ORDER — PREDNISONE 20 MG PO TABS: 20.0000 mg | ORAL_TABLET | Freq: Every day | ORAL | Status: DC

## 2023-02-23 MED ORDER — SODIUM CHLORIDE 0.9% IV SOLUTION: Freq: Once | INTRAVENOUS | Status: AC

## 2023-02-23 NOTE — Discharge Summary (Incomplete)
Physician Discharge Summary  Amandeep Funkhouser Weider NWG:[id number] DOB: December 01, 1955 DOA: 02/17/2023  PCP: Benetta Spar, MD  Admit date: 02/17/2023 Discharge date: 02/23/2023  Admitted From: Home Disposition:  SNF  Recommendations for Outpatient Follow-up:  Follow up with PCP in 1-2 weeks Please obtain BMP/CBC in one week   Home Health:No Equipment/Devices:None  Discharge Condition:STable CODE STATUS:Full Diet recommendation: Heart Healthy   Brief/Interim Summary: 67 year old female with history of CVA, hypertension, ongoing tobacco abuse comes to the emergency department with acute onset of right hip pain while patient was working on her fireplace bending lost her balance fell on her right side, unable to ambulate, x-ray found hip nondisplaced right femoral fracture.   Discharge Diagnoses:  Principal Problem:   Closed right hip fracture (HCC) Active Problems:   Hypokalemia   Hyponatremia   Essential hypertension   Dyslipidemia   Malnutrition of moderate degree  Close right intertrochanter fracture: Orthopedic surgery was consulted she status post surgical intervention on 02/18/2023. She has some bleeding at the surgical site dressing was applied Plavix was held. Hemoglobin was low it was repleted it was 7.3. PT evaluated the patient recommended skilled nursing facility.  Acute blood loss anemia: Hemoglobin like around 11 trended down to like around 7, hemoglobin was checked was 6.9 repeated it was 7.3. Plavix was held. Will transfuse 1 unit of packed red blood cells.  COPD: Continue inhalers no changes to his medication. He will go home on a prednisone taper.  Hypokalemia: It was repleted  Hypovolemic hyponatremia: Now resolved.  Essential hypertension: Seems to be well-controlled his Norvasc was increased to 10.  History of CVA: Holding Plavix can be resumed as an outpatient in 2 days.   Discharge Instructions  Discharge Instructions     Call MD  for:  redness, tenderness, or signs of infection (pain, swelling, redness, odor or green/yellow discharge around incision site)   Complete by: As directed    Call MD for:  temperature >100.4   Complete by: As directed    Diet - low sodium heart healthy   Complete by: As directed    Diet general   Complete by: As directed    Increase activity slowly   Complete by: As directed       Allergies as of 02/23/2023   No Known Allergies      Medication List     STOP taking these medications    diphenhydrAMINE 25 mg capsule Commonly known as: BENADRYL   losartan-hydrochlorothiazide 100-12.5 MG tablet Commonly known as: HYZAAR   potassium chloride SA 20 MEQ tablet Commonly known as: KLOR-CON M       TAKE these medications    acetaminophen 650 MG CR tablet Commonly known as: TYLENOL Take 1,300 mg by mouth every 8 (eight) hours as needed for pain. What changed: Another medication with the same name was removed. Continue taking this medication, and follow the directions you see here.   alendronate 70 MG tablet Commonly known as: FOSAMAX Take 70 mg by mouth once a week.   amLODipine 10 MG tablet Commonly known as: NORVASC Take 1 tablet (10 mg total) by mouth daily. What changed:  medication strength how much to take   aspirin 81 MG chewable tablet Chew 1 tablet (81 mg total) by mouth daily.   atorvastatin 80 MG tablet Commonly known as: LIPITOR Take 80 mg by mouth daily.   azithromycin 250 MG tablet Commonly known as: ZITHROMAX COPD exacerbation   clopidogrel 75 MG tablet Commonly known as: PLAVIX  Take 75 mg by mouth daily.   docusate sodium 100 MG capsule Commonly known as: COLACE Take 100 mg by mouth 2 (two) times daily.   HYDROcodone-acetaminophen 5-325 MG tablet Commonly known as: NORCO/VICODIN Take 1 tablet by mouth every 6 (six) hours as needed for up to 3 days for severe pain (pain score 7-10).   ipratropium-albuterol 0.5-2.5 (3) MG/3ML  Soln Commonly known as: DUONEB Take 3 mLs by nebulization every 6 (six) hours.   losartan 100 MG tablet Commonly known as: COZAAR Take 1 tablet (100 mg total) by mouth daily.   mirtazapine 7.5 MG tablet Commonly known as: REMERON Take 7.5 mg by mouth at bedtime.   predniSONE 10 MG tablet Commonly known as: DELTASONE Takes 2 tabs for 1 days, then 1 tab for 1 days, and then stop.   thiamine 100 MG tablet Commonly known as: VITAMIN B1 Take 1 tablet (100 mg total) by mouth daily.        Follow-up Information     Myrene Galas, MD. Schedule an appointment as soon as possible for a visit in 10 day(s).   Specialty: Orthopedic Surgery Contact information: [address] [place] [address] [phone]                No Known Allergies  Consultations: ***Specify Physician/Group   Procedures/Studies: DG Swallowing Func-Speech Pathology  Result Date: 02/19/2023 Table formatting from the original result was not included. Modified Barium Swallow Study Patient Details Name: Jazzmyn Byes MRN: [id number] Date of Birth: 10-31-1955 Today's Date: 02/19/2023 HPI/PMH: HPI: Patient is a 66 y.o. female with PMH: CVA (2022; with resultant dysphagia requiring PEG and modified diet) HTN, ongoing tobacco abuse. She presented to the hospital on 02/17/2023 with acute onset of right hip pain after she was bending over to work on her fireplace, lost her balance and fell on her right side. She denied LOC. She underwent IM nail of right hip on 02/19/23. Clinical Impression: Clinical Impression: Patient presents with a mild oropharyngeal dysphagia as per this modified barium swallow study. As compared to 2022 MBS images as well as SLP's clinical impression statement at that time, patient's dysphagia does not appear to have signifcantly changed. She continues with decreased lingual ROM and strength, leading to delayed anterior to posterior transit of liquid and solid boluses as well as reduced  laryngeal elevation and laryngeal vestibule closure, resulting in penetration of thin liquids above vocal cords (PAS 3) and instances of penetration to the vocal cords (PAS 4, 5) and penetration (PAS 2) with nectar thick liquids. Swallow initiated at level of pyriform sinus with thin and nectar thick liquids but initiated at level of vallecular sinus with puree solids and honey thick liquids. Only trace residuals on lingual surface, palate and base of tongue visualized s/p initial swallows but with eventual full clearance. No instances of aspiration observed. All consistencies transited through PES without difficulty and without residuals or retrograde flow at or below PES. SLP educated patient as to results and recommended she continue with the swallow safety precautions she has already been using at home. No further skilled SLP warranted at this time. Factors that may increase risk of adverse event in presence of aspiration Rubye Oaks & Clearance Coots 2021): Factors that may increase risk of adverse event in presence of aspiration Rubye Oaks & Clearance Coots 2021): Weak cough; Poor general health and/or compromised immunity Recommendations/Plan: Swallowing Evaluation Recommendations Swallowing Evaluation Recommendations Recommendations: PO diet PO Diet Recommendation: Dysphagia 3 (Mechanical soft); Thin liquids (Level 0) Liquid Administration  via: Cup; Straw Medication Administration: Whole meds with puree Supervision: Patient able to self-feed Swallowing strategies  : Slow rate; Small bites/sips Postural changes: Position pt fully upright for meals; Stay upright 30-60 min after meals Oral care recommendations: Oral care BID (2x/day); Pt independent with oral care Treatment Plan Treatment Plan Treatment recommendations: No treatment recommended at this time Follow-up recommendations: No SLP follow up Recommendations Comment: TBD following MBS Functional status assessment: Patient has not had a recent decline in their functional  status. Recommendations Recommendations for follow up therapy are one component of a multi-disciplinary discharge planning process, led by the attending physician.  Recommendations may be updated based on patient status, additional functional criteria and insurance authorization. Assessment: Orofacial Exam: Orofacial Exam Oral Cavity: Oral Hygiene: WFL Oral Cavity - Dentition: Poor condition; Missing dentition Orofacial Anatomy: WFL Oral Motor/Sensory Function: WFL Anatomy: Anatomy: WFL Boluses Administered: Boluses Administered Boluses Administered: Thin liquids (Level 0); Mildly thick liquids (Level 2, nectar thick); Moderately thick liquids (Level 3, honey thick); Puree  Oral Impairment Domain: Oral Impairment Domain Lip Closure: No labial escape Tongue control during bolus hold: Not tested Bolus preparation/mastication: Slow prolonged chewing/mashing with complete recollection Bolus transport/lingual motion: Slow tongue motion Oral residue: Residue collection on oral structures Location of oral residue : Tongue; Palate Initiation of pharyngeal swallow : Pyriform sinuses  Pharyngeal Impairment Domain: Pharyngeal Impairment Domain Soft palate elevation: No bolus between soft palate (SP)/pharyngeal wall (PW) Laryngeal elevation: Partial superior movement of thyroid cartilage/partial approximation of arytenoids to epiglottic petiole Anterior hyoid excursion: Complete anterior movement Epiglottic movement: Complete inversion Laryngeal vestibule closure: Incomplete, narrow column air/contrast in laryngeal vestibule Pharyngeal stripping wave : Present - complete Pharyngoesophageal segment opening: Complete distension and complete duration, no obstruction of flow Tongue base retraction: Trace column of contrast or air between tongue base and PPW Pharyngeal residue: Complete pharyngeal clearance Location of pharyngeal residue: N/A  Esophageal Impairment Domain: Esophageal Impairment Domain Esophageal clearance upright  position: Complete clearance, esophageal coating Pill: No data recorded Penetration/Aspiration Scale Score: Penetration/Aspiration Scale Score 1.  Material does not enter airway: Moderately thick liquids (Level 3, honey thick); Puree 2.  Material enters airway, remains ABOVE vocal cords then ejected out: Mildly thick liquids (Level 2, nectar thick) 3.  Material enters airway, remains ABOVE vocal cords and not ejected out: Thin liquids (Level 0) 4.  Material enters airway, CONTACTS cords then ejected out: Thin liquids (Level 0) 5.  Material enters airway, CONTACTS cords and not ejected out: Thin liquids (Level 0) Compensatory Strategies: Compensatory Strategies Compensatory strategies: No   General Information: Caregiver present: No  Diet Prior to this Study: Dysphagia 3 (mechanical soft); Thin liquids (Level 0)   Temperature : Normal   Respiratory Status: WFL   Supplemental O2: None (Room air)   History of Recent Intubation: Yes  Behavior/Cognition: Alert; Cooperative; Pleasant mood Self-Feeding Abilities: Able to self-feed Baseline vocal quality/speech: Normal Volitional Cough: Able to elicit Volitional Swallow: Able to elicit Exam Limitations: No limitations Goal Planning: No data recorded No data recorded No data recorded Patient/Family Stated Goal: return home Consulted and agree with results and recommendations: Patient Pain: Pain Assessment Pain Assessment: No/denies pain End of Session: Start Time:SLP Start Time (ACUTE ONLY): 0955 Stop Time: SLP Stop Time (ACUTE ONLY): 1010 Time Calculation:SLP Time Calculation (min) (ACUTE ONLY): 15 min Charges: SLP Evaluations $ SLP Speech Visit: 1 Visit SLP Evaluations $BSS Swallow: 1 Procedure $MBS Swallow: 1 Procedure SLP visit diagnosis: SLP Visit Diagnosis: Dysphagia, oropharyngeal phase (R13.12) Past Medical History:  Past Medical History: Diagnosis Date  CVA (cerebral vascular accident) (HCC)   Dysphagia   Hypertension   Hypokalemia   Smoker   2 ppd x 55 years Past  Surgical History: Past Surgical History: Procedure Laterality Date  ABDOMINAL HYSTERECTOMY    late 1980s  INTRAMEDULLARY (IM) NAIL INTERTROCHANTERIC Right 02/18/2023  Procedure: INTRAMEDULLARY (IM) NAIL INTERTROCHANTERIC;  Surgeon: Myrene Galas, MD;  Location: [facility] OR;  Service: Orthopedics;  Laterality: Right;  IR GASTROSTOMY TUBE MOD SED  09/22/2020  IR GASTROSTOMY TUBE MOD SED  10/04/2020 Angela Nevin, MA, CCC-SLP Speech Therapy   DG HIP UNILAT W OR W/O PELVIS 2-3 VIEWS RIGHT  Result Date: 02/18/2023 CLINICAL DATA:  Right femoral intertrochanteric fracture. EXAM: DG HIP (WITH OR WITHOUT PELVIS) 2-3V RIGHT COMPARISON:  Radiograph dated 02/17/2023. FINDINGS: Status post ORIF of right femoral neck fracture. The hardware is intact. The soft tissues are unremarkable. Vascular calcifications noted. IMPRESSION: Status post ORIF of right femoral neck fracture. Electronically Signed   By: Elgie Collard M.D.   On: 02/18/2023 22:52   DG HIP UNILAT WITH PELVIS 2-3 VIEWS RIGHT  Result Date: 02/18/2023 CLINICAL DATA:  Elective surgery EXAM: DG HIP (WITH OR WITHOUT PELVIS) 2-3V RIGHT COMPARISON:  Right hip x-ray 02/17/2023 FINDINGS: Six intraoperative fluoroscopic views of the right hip were submitted. Right hip screw and short femoral nail were placed fixating intratrochanteric fracture. Alignment is anatomic. Fluoroscopy time: 52 seconds. Fluoroscopy dose: 6.26 micro gray. IMPRESSION: Intraoperative fluoroscopic views of the right hip. Electronically Signed   By: Darliss Cheney M.D.   On: 02/18/2023 22:06   DG C-Arm 1-60 Min-No Report  Result Date: 02/18/2023 Fluoroscopy was utilized by the requesting physician.  No radiographic interpretation.   DG C-Arm 1-60 Min-No Report  Result Date: 02/18/2023 Fluoroscopy was utilized by the requesting physician.  No radiographic interpretation.   DG Pelvis Portable  Result Date: 02/17/2023 CLINICAL DATA:  Check for pelvic fracture after fall EXAM: PORTABLE  PELVIS 1-2 VIEWS COMPARISON:  Femur radiographs earlier today FINDINGS: Redemonstrated right intertrochanteric femur fracture. No pelvic fracture. No diastasis. Degenerative changes pubic symphysis, both hips, SI joints and lower lumbar spine. IMPRESSION: Redemonstrated right intertrochanteric fracture. No additional fractures. Electronically Signed   By: Minerva Fester M.D.   On: 02/17/2023 22:15   DG FEMUR, MIN 2 VIEWS RIGHT  Result Date: 02/17/2023 CLINICAL DATA:  Fall EXAM: RIGHT FEMUR 2 VIEWS COMPARISON:  Right hip x-ray 04/16/2005. FINDINGS: The bones are osteopenic. There is a nondisplaced right femoral intratrochanteric fracture. There is no dislocation. Peripheral vascular calcifications are present. IMPRESSION: Nondisplaced right femoral intratrochanteric fracture. Electronically Signed   By: Darliss Cheney M.D.   On: 02/17/2023 21:20   (Echo, Carotid, EGD, Colonoscopy, ERCP)    Subjective:   Discharge Exam: Vitals:   02/22/23 2021 02/23/23 0424  BP:  118/65  Pulse:  68  Resp:  16  Temp:  98.2 F (36.8 C)  SpO2: 97% 100%   Vitals:   02/22/23 1545 02/22/23 1939 02/22/23 2021 02/23/23 0424  BP: (!) 95/51 (!) 120/57  118/65  Pulse: 77 78  68  Resp: 18 16  16   Temp: 97.7 F (36.5 C) 98.1 F (36.7 C)  98.2 F (36.8 C)  TempSrc: Oral Oral  Oral  SpO2: 100% 100% 97% 100%  Weight:      Height:        General: Pt is alert, awake, not in acute distress Cardiovascular: RRR, S1/S2 +, no rubs, no gallops Respiratory: CTA bilaterally, no  wheezing, no rhonchi Abdominal: Soft, NT, ND, bowel sounds + Extremities: no edema, no cyanosis    The results of significant diagnostics from this hospitalization (including imaging, microbiology, ancillary and laboratory) are listed below for reference.     Microbiology: Recent Results (from the past 240 hour(s))  Surgical pcr screen     Status: None   Collection Time: 02/18/23  1:01 AM   Specimen: Nasal Mucosa; Nasal Swab  Result  Value Ref Range Status   MRSA, PCR NEGATIVE NEGATIVE Final   Staphylococcus aureus NEGATIVE NEGATIVE Final    Comment: (NOTE) The Xpert SA Assay (FDA approved for NASAL specimens in patients 80 years of age and older), is one component of a comprehensive surveillance program. It is not intended to diagnose infection nor to guide or monitor treatment. Performed at [facility], [address] [place], [place] [address]      Labs: BNP (last 3 results) No results for input(s): "BNP" in the last 8760 hours. Basic Metabolic Panel: Recent Labs  Lab 02/19/23 0603 02/20/23 0519 02/21/23 0604 02/21/23 1023 02/22/23 0828  NA 138 134* 138 139 138  K 3.8 3.7 3.8 3.9 5.0  CL 106 105 105 104 103  CO2 21* 25 26 28 29   GLUCOSE 75 122* 98 107* 112*  BUN 8 10 10 9 16   CREATININE 0.90 0.60 0.61 0.56 0.63  CALCIUM 7.8* 8.2* 8.6* 8.9 9.0   Liver Function Tests: Recent Labs  Lab 02/17/23 2028  AST 22  ALT 24  ALKPHOS 46  BILITOT 0.7  PROT 6.7  ALBUMIN 3.9   No results for input(s): "LIPASE", "AMYLASE" in the last 168 hours. No results for input(s): "AMMONIA" in the last 168 hours. CBC: Recent Labs  Lab 02/17/23 2028 02/18/23 0655 02/19/23 0603 02/19/23 2334 02/20/23 0519 02/21/23 1023 02/22/23 0828 02/22/23 0952  WBC 16.4*   < > 9.9  --  10.6* 8.5 9.1 9.9  NEUTROABS 15.0*  --   --   --   --   --   --   --   HGB 12.4   < > 8.6* 7.5* 7.2* 8.0* 6.9* 7.3*  HCT 36.4   < > 26.3* 23.1* 22.5* 24.3* 21.9* 22.5*  MCV 94.3   < > 96.7  --  94.9 95.7 96.5 95.7  PLT 306   < > 198  --  178 215 232 233   < > = values in this interval not displayed.   Cardiac Enzymes: Recent Labs  Lab 02/17/23 2028  CKTOTAL 112   BNP: Invalid input(s): "POCBNP" CBG: No results for input(s): "GLUCAP" in the last 168 hours. D-Dimer No results for input(s): "DDIMER" in the last 72 hours. Hgb A1c No results for input(s): "HGBA1C" in the last 72 hours. Lipid Profile No results for input(s):  "CHOL", "HDL", "LDLCALC", "TRIG", "CHOLHDL", "LDLDIRECT" in the last 72 hours. Thyroid function studies No results for input(s): "TSH", "T4TOTAL", "T3FREE", "THYROIDAB" in the last 72 hours.  Invalid input(s): "FREET3" Anemia work up No results for input(s): "VITAMINB12", "FOLATE", "FERRITIN", "TIBC", "IRON", "RETICCTPCT" in the last 72 hours. Urinalysis    Component Value Date/Time   COLORURINE YELLOW 02/17/2023 2210   APPEARANCEUR HAZY (A) 02/17/2023 2210   LABSPEC 1.014 02/17/2023 2210   PHURINE 6.0 02/17/2023 2210   GLUCOSEU NEGATIVE 02/17/2023 2210   HGBUR NEGATIVE 02/17/2023 2210   BILIRUBINUR NEGATIVE 02/17/2023 2210   KETONESUR 20 (A) 02/17/2023 2210   PROTEINUR NEGATIVE 02/17/2023 2210   NITRITE NEGATIVE 02/17/2023  2210   LEUKOCYTESUR NEGATIVE 02/17/2023 2210   Sepsis Labs Recent Labs  Lab 02/20/23 0519 02/21/23 1023 02/22/23 0828 02/22/23 0952  WBC 10.6* 8.5 9.1 9.9   Microbiology Recent Results (from the past 240 hour(s))  Surgical pcr screen     Status: None   Collection Time: 02/18/23  1:01 AM   Specimen: Nasal Mucosa; Nasal Swab  Result Value Ref Range Status   MRSA, PCR NEGATIVE NEGATIVE Final   Staphylococcus aureus NEGATIVE NEGATIVE Final    Comment: (NOTE) The Xpert SA Assay (FDA approved for NASAL specimens in patients 57 years of age and older), is one component of a comprehensive surveillance program. It is not intended to diagnose infection nor to guide or monitor treatment. Performed at [facility], [address] [place], [place] [address]      Time coordinating discharge: Over 35 minutes  SIGNED:   Marinda Elk, MD  [facility] 02/23/2023, 8:08 AM Pager   If 7PM-7AM, please contact night-coverage [email or web address] Password [email or web address]

## 2023-02-23 NOTE — Progress Notes (Signed)
RN received a critical lab value call from the lab stating that the patient's hemoglobin is 6.9. RN messaged attending Dr. Heron Nay  to notify him. He stated that labs will be repeated for this patient.

## 2023-02-23 NOTE — Progress Notes (Signed)
[facility] PROGRESS NOTE    Progress Note  Kendra Lee  ZOX:[id number] DOB: 10-28-1955 DOA: 02/17/2023 PCP: Benetta Spar, MD     Brief Narrative:   Kendra Lee is an 67 y.o. female with history of CVA, hypertension, ongoing tobacco abuse comes to the emergency department with acute onset of right hip pain while patient was working on her fireplace bending lost her balance fell on her right side, unable to ambulate, x-ray found hip nondisplaced right femoral fracture.    Assessment/Plan:   Close right intertrochanter fracture: Orthopedic surgery was consulted she status post surgical intervention on 02/18/2023. She has some bleeding at the surgical site dressing was applied Plavix was held. Hemoglobin was low it was repleted it was 7.3.,  Will go ahead and transfuse 1 unit packed red blood cells. PT evaluated the patient recommended skilled nursing facility.   Acute blood loss anemia: Hemoglobin like around 11 trended down to like around 7, hemoglobin was checked was 6.9 repeated it was 7.3. Plavix was held. Will transfuse 1 unit of packed red blood cells. Check CBC post transfusional.   COPD: Continue inhalers no changes to his medication. He will go home on a prednisone taper.   Hypokalemia: It was repleted   Hypovolemic hyponatremia: Now resolved.   Essential hypertension: Seems to be well-controlled his Norvasc was increased to 10.   History of CVA: Holding Plavix can be resumed as an outpatient in 2 days.      DVT prophylaxis: lovenox Family Communication:none Status is: Inpatient Remains inpatient appropriate because: Awaiting skilled nursing facility placement.    Code Status:     Code Status Orders  (From admission, onward)           Start     Ordered   02/17/23 2223  Full code  Continuous       Question:  By:  Answer:  Consent: discussion documented in EHR   02/17/23 2225           Code Status History      Date Active Date Inactive Code Status Order ID Comments User Context   10/04/2022 1445 10/09/2022 1857 Full Code [id number]  Shon Hale, MD ED   09/18/2020 0147 10/09/2020 1814 Full Code [id number]  Zierle-Ghosh, [place] B, DO ED         IV Access:   Peripheral IV   Procedures and diagnostic studies:   No results found.   Medical Consultants:   None.   Subjective:    Marvia Pickles Bisig she is up with no complaints tolerating her diet.  Objective:    Vitals:   02/22/23 1545 02/22/23 1939 02/22/23 2021 02/23/23 0424  BP: (!) 95/51 (!) 120/57  118/65  Pulse: 77 78  68  Resp: 18 16  16   Temp: 97.7 F (36.5 C) 98.1 F (36.7 C)  98.2 F (36.8 C)  TempSrc: Oral Oral  Oral  SpO2: 100% 100% 97% 100%  Weight:      Height:       SpO2: 100 % O2 Flow Rate (L/min): 3 L/min  No intake or output data in the 24 hours ending 02/23/23 0811 Filed Weights   02/17/23 1946 02/18/23 1456  Weight: 42.2 kg 42.2 kg    Exam: General exam: In no acute distress. Respiratory system: Good air movement and clear to auscultation. Cardiovascular system: S1 & S2 heard, RRR. No JVD. Gastrointestinal system: Abdomen is nondistended, soft and nontender.  Extremities: No pedal edema. Skin: No rashes,  lesions or ulcers Psychiatry: Judgement and insight appear normal. Mood & affect appropriate.    Data Reviewed:    Labs: Basic Metabolic Panel: Recent Labs  Lab 02/19/23 0603 02/20/23 0519 02/21/23 0604 02/21/23 1023 02/22/23 0828  NA 138 134* 138 139 138  K 3.8 3.7 3.8 3.9 5.0  CL 106 105 105 104 103  CO2 21* 25 26 28 29   GLUCOSE 75 122* 98 107* 112*  BUN 8 10 10 9 16   CREATININE 0.90 0.60 0.61 0.56 0.63  CALCIUM 7.8* 8.2* 8.6* 8.9 9.0   GFR Estimated Creatinine Clearance: 44.2 mL/min (by C-G formula based on SCr of 0.63 mg/dL). Liver Function Tests: Recent Labs  Lab 02/17/23 2028  AST 22  ALT 24  ALKPHOS 46  BILITOT 0.7  PROT 6.7  ALBUMIN 3.9   No results for  input(s): "LIPASE", "AMYLASE" in the last 168 hours. No results for input(s): "AMMONIA" in the last 168 hours. Coagulation profile No results for input(s): "INR", "PROTIME" in the last 168 hours. COVID-19 Labs  No results for input(s): "DDIMER", "FERRITIN", "LDH", "CRP" in the last 72 hours.  Lab Results  Component Value Date   SARSCOV2NAA NEGATIVE 09/17/2020    CBC: Recent Labs  Lab 02/17/23 2028 02/18/23 0655 02/19/23 0603 02/19/23 2334 02/20/23 0519 02/21/23 1023 02/22/23 0828 02/22/23 0952  WBC 16.4*   < > 9.9  --  10.6* 8.5 9.1 9.9  NEUTROABS 15.0*  --   --   --   --   --   --   --   HGB 12.4   < > 8.6* 7.5* 7.2* 8.0* 6.9* 7.3*  HCT 36.4   < > 26.3* 23.1* 22.5* 24.3* 21.9* 22.5*  MCV 94.3   < > 96.7  --  94.9 95.7 96.5 95.7  PLT 306   < > 198  --  178 215 232 233   < > = values in this interval not displayed.   Cardiac Enzymes: Recent Labs  Lab 02/17/23 2028  CKTOTAL 112   BNP (last 3 results) No results for input(s): "PROBNP" in the last 8760 hours. CBG: No results for input(s): "GLUCAP" in the last 168 hours. D-Dimer: No results for input(s): "DDIMER" in the last 72 hours. Hgb A1c: No results for input(s): "HGBA1C" in the last 72 hours. Lipid Profile: No results for input(s): "CHOL", "HDL", "LDLCALC", "TRIG", "CHOLHDL", "LDLDIRECT" in the last 72 hours. Thyroid function studies: No results for input(s): "TSH", "T4TOTAL", "T3FREE", "THYROIDAB" in the last 72 hours.  Invalid input(s): "FREET3" Anemia work up: No results for input(s): "VITAMINB12", "FOLATE", "FERRITIN", "TIBC", "IRON", "RETICCTPCT" in the last 72 hours. Sepsis Labs: Recent Labs  Lab 02/20/23 0519 02/21/23 1023 02/22/23 0828 02/22/23 0952  WBC 10.6* 8.5 9.1 9.9   Microbiology Recent Results (from the past 240 hour(s))  Surgical pcr screen     Status: None   Collection Time: 02/18/23  1:01 AM   Specimen: Nasal Mucosa; Nasal Swab  Result Value Ref Range Status   MRSA, PCR NEGATIVE  NEGATIVE Final   Staphylococcus aureus NEGATIVE NEGATIVE Final    Comment: (NOTE) The Xpert SA Assay (FDA approved for NASAL specimens in patients 83 years of age and older), is one component of a comprehensive surveillance program. It is not intended to diagnose infection nor to guide or monitor treatment. Performed at [facility], [address] [place], [place] [address]      Medications:    sodium chloride   Intravenous Once  amLODipine  10 mg Oral Daily   atorvastatin  80 mg Oral Daily   azithromycin  500 mg Oral Daily   Chlorhexidine Gluconate Cloth  6 each Topical Daily   docusate sodium  100 mg Oral BID   ipratropium-albuterol  3 mL Nebulization BID   lactose free nutrition  237 mL Oral BID BM   losartan  100 mg Oral Daily   mirtazapine  7.5 mg Oral QHS   multivitamin with minerals  1 tablet Oral Daily   predniSONE  20 mg Oral Q breakfast   thiamine  100 mg Oral Daily   Continuous Infusions:    LOS: 6 days   Marinda Elk  [facility]  02/23/2023, 8:11 AM

## 2023-02-23 NOTE — Plan of Care (Signed)
  Problem: Education: Goal: Knowledge of General Education information will improve Description: Including pain rating scale, medication(s)/side effects and non-pharmacologic comfort measures Outcome: Progressing   Problem: Pain Management: Goal: General experience of comfort will improve Outcome: Progressing

## 2023-02-23 NOTE — Progress Notes (Addendum)
Orthopaedic Trauma Progress Note  SUBJECTIVE: Doing well today, pain controlled.  About to eat breakfast.  Has not had any additional issues with oozing or drainage from her incisions. No chest pain. No SOB. No nausea/vomiting.  Denies any dizziness or lightheadedness.  No other complaints.  Patient remains agreeable to SNF at discharge.  No family at bedside currently  OBJECTIVE:  Vitals:   02/22/23 2021 02/23/23 0424  BP:  118/65  Pulse:  68  Resp:  16  Temp:  98.2 F (36.8 C)  SpO2: 97% 100%    General: Sitting up in bed, no acute distress.  Pleasant and cooperative Respiratory: No increased work of breathing on 3L O2 via [place] RLE: Dressings over the hip and proximal thigh are stable with no drainage noted.  No significant tenderness with palpation throughout the thigh.  No calf tenderness.  Ankle dorsiflexion/plantarflexion intact. + EHL/FHL.  No increase in pain with passive stretch of the toes.  Compartment soft and compressible.  Neurovascularly intact  IMAGING: Stable post op imaging.   LABS:  Results for orders placed or performed during the hospital encounter of 02/17/23 (from the past 24 hour(s))  CBC     Status: Abnormal   Collection Time: 02/22/23  8:28 AM  Result Value Ref Range   WBC 9.1 4.0 - 10.5 K/uL   RBC 2.27 (L) 3.87 - 5.11 MIL/uL   Hemoglobin 6.9 (LL) 12.0 - 15.0 g/dL   HCT 60.4 (L) 54.0 - 98.1 %   MCV 96.5 80.0 - 100.0 fL   MCH 30.4 26.0 - 34.0 pg   MCHC 31.5 30.0 - 36.0 g/dL   RDW 19.1 47.8 - 29.5 %   Platelets 232 150 - 400 K/uL   nRBC 0.0 0.0 - 0.2 %  Basic metabolic panel     Status: Abnormal   Collection Time: 02/22/23  8:28 AM  Result Value Ref Range   Sodium 138 135 - 145 mmol/L   Potassium 5.0 3.5 - 5.1 mmol/L   Chloride 103 98 - 111 mmol/L   CO2 29 22 - 32 mmol/L   Glucose, Bld 112 (H) 70 - 99 mg/dL   BUN 16 8 - 23 mg/dL   Creatinine, Ser 6.21 0.44 - 1.00 mg/dL   Calcium 9.0 8.9 - 30.8 mg/dL   GFR, Estimated >65 >78 mL/min   Anion gap 6 5  - 15  CBC     Status: Abnormal   Collection Time: 02/22/23  9:52 AM  Result Value Ref Range   WBC 9.9 4.0 - 10.5 K/uL   RBC 2.35 (L) 3.87 - 5.11 MIL/uL   Hemoglobin 7.3 (L) 12.0 - 15.0 g/dL   HCT 46.9 (L) 62.9 - 52.8 %   MCV 95.7 80.0 - 100.0 fL   MCH 31.1 26.0 - 34.0 pg   MCHC 32.4 30.0 - 36.0 g/dL   RDW 41.3 24.4 - 01.0 %   Platelets 233 150 - 400 K/uL   nRBC 0.0 0.0 - 0.2 %    ASSESSMENT: Kendra Lee is a 67 y.o. female, 5 Days Post-Op s/p INTRAMEDULLARY NAIL RIGHT INTERTROCHANTERIC FEMUR FRACTURE by Dr. Carola Frost  CV/Blood loss: Acute blood loss anemia, Hgb noted to be 6.9 on 02/22/2023, was 7.3 on recheck.  No transfusion required.  Remains hemodynamically stable.  PLAN: Weightbearing: WBAT RLE ROM: Unrestricted ROM Incisional and dressing care: Daily wound care as needed.  Okay to reinforce dressings and change dressings as needed at this point.   Showering: Okay to get  incisions wet if no drainage Orthopedic device(s): None  Pain management: Continue multimodal pain control.  Minimize narcotics as able. VTE prophylaxis: Plavix on hold until discharge. SCDs ID: Perioperative ABX completed Foley/Lines:  No foley, KVO IVFs Impediments to Fracture Healing: Osteoporosis/fragility fracture.  Nicotine dependence.  Vitamin D level 58, no additional supplementation needed.  Dispo: PT/OT evaluations ongoing.  Recommending SNF.  TOC following for placement.  Okay for discharge from ortho standpoint once cleared by medicine team and therapies  D/C recommendations: -Hydrocodone for pain control -Resume Plavix for DVT prophylaxis  Follow - up plan: 10 to 14 days after discharge with Dr. Carola Frost for wound check and repeat x-rays   Contact information:  Truitt Merle MD, Thyra Breed PA-C. After hours and holidays please check [email or web address] for group call information for [facility]   Thompson Caul, PA-C [phone] (office) [email or web address]

## 2023-02-24 DIAGNOSIS — S72001A Fracture of unspecified part of neck of right femur, initial encounter for closed fracture: Secondary | ICD-10-CM | POA: Diagnosis not present

## 2023-02-24 LAB — TYPE AND SCREEN
ABO/RH(D): A POS
Antibody Screen: NEGATIVE
Unit division: 0

## 2023-02-24 LAB — BPAM RBC
Blood Product Expiration Date: 202412202359
ISSUE DATE / TIME: 202412011202
Unit Type and Rh: 6200

## 2023-02-24 LAB — CBC
HCT: 28.9 % — ABNORMAL LOW (ref 36.0–46.0)
Hemoglobin: 9.6 g/dL — ABNORMAL LOW (ref 12.0–15.0)
MCH: 31.4 pg (ref 26.0–34.0)
MCHC: 33.2 g/dL (ref 30.0–36.0)
MCV: 94.4 fL (ref 80.0–100.0)
Platelets: 276 10*3/uL (ref 150–400)
RBC: 3.06 MIL/uL — ABNORMAL LOW (ref 3.87–5.11)
RDW: 15.7 % — ABNORMAL HIGH (ref 11.5–15.5)
WBC: 9.5 10*3/uL (ref 4.0–10.5)
nRBC: 0.3 % — ABNORMAL HIGH (ref 0.0–0.2)

## 2023-02-24 MED ORDER — LACTULOSE 10 GM/15ML PO SOLN
30.0000 g | Freq: Once | ORAL | Status: AC
  Administered 2023-02-24: 30 g via ORAL
  Filled 2023-02-24: qty 45

## 2023-02-24 MED ORDER — CLOPIDOGREL BISULFATE 75 MG PO TABS
75.0000 mg | ORAL_TABLET | Freq: Every day | ORAL | Status: DC
  Administered 2023-02-24 – 2023-02-25 (×2): 75 mg via ORAL
  Filled 2023-02-24 (×2): qty 1

## 2023-02-24 NOTE — Progress Notes (Signed)
Physical Therapy Treatment Patient Details Name: Kendra Lee MRN: [id number] DOB: 06/08/1955 Today's Date: 02/24/2023   History of Present Illness 67 yo female presents to [facility] on 11/25 s/p fall, sustained R nondisplaced femoral intertrochanteric fx. S/p R hip IMN 11/26. PMH includes CVA, HTN, tobacco abuse.    PT Comments  Pt premedicated prior to session. Pleasant and agreeable to participate in therapy session. Initiated with gentle ROM and strengthening exercises of RLE. Continues with significant swelling/edema of right lower extremity; re-positioned to promote elevation and applied ice post session. Utilized [place] (lift equipment) to transfer from bed to chair and performed serial sit to stands to promote increased weightbearing and functional strengthening. Patient will benefit from continued inpatient follow up therapy, <3 hours/day.   If plan is discharge home, recommend the following: Two people to help with walking and/or transfers;Two people to help with bathing/dressing/bathroom   Can travel by private vehicle     No  Equipment Recommendations  None recommended by PT    Recommendations for Other Services       Precautions / Restrictions Precautions Precautions: Fall Restrictions Weight Bearing Restrictions: Yes RLE Weight Bearing: Weight bearing as tolerated     Mobility  Bed Mobility Overal bed mobility: Needs Assistance Bed Mobility: Supine to Sit     Supine to sit: Mod assist     General bed mobility comments: Assist for RLE management and use of bed pad to scoot hip out to edge of bed    Transfers Overall transfer level: Needs assistance Equipment used: Ambulation equipment used Transfers: Sit to/from Stand Sit to Stand: Min assist, Mod assist, +2 physical assistance           General transfer comment: Min-modA + 2 to stand to [place]; able to eventually achieve upright posture    Ambulation/Gait               General Gait Details:  unable   Stairs             Wheelchair Mobility     Tilt Bed    Modified Rankin (Stroke Patients Only)       Balance Overall balance assessment: Needs assistance, History of Falls Sitting-balance support: Feet supported, No upper extremity supported Sitting balance-Leahy Scale: Fair                                      Cognition Arousal: Alert Behavior During Therapy: WFL for tasks assessed/performed Overall Cognitive Status: Within Functional Limits for tasks assessed                                          Exercises General Exercises - Lower Extremity Ankle Circles/Pumps: Right, 5 reps, Supine Quad Sets: Right, 5 reps, Supine Long Arc Quad: Right, 5 reps, Seated Heel Slides: AAROM, Right, 5 reps, Supine    General Comments        Pertinent Vitals/Pain Pain Assessment Pain Assessment: Faces Faces Pain Scale: Hurts even more Pain Location: R hip, incision site Pain Descriptors / Indicators: Aching, Discomfort, Dull, Grimacing, Guarding, Crying Pain Intervention(s): Limited activity within patient's tolerance, Monitored during session, Premedicated before session    Home Living  Prior Function            PT Goals (current goals can now be found in the care plan section) Acute Rehab PT Goals Patient Stated Goal: home Potential to Achieve Goals: Fair Progress towards PT goals: Progressing toward goals    Frequency    Min 1X/week      PT Plan      Co-evaluation              AM-PAC PT "6 Clicks" Mobility   Outcome Measure  Help needed turning from your back to your side while in a flat bed without using bedrails?: A Lot Help needed moving from lying on your back to sitting on the side of a flat bed without using bedrails?: A Lot Help needed moving to and from a bed to a chair (including a wheelchair)?: A Lot Help needed standing up from a chair using your arms  (e.g., wheelchair or bedside chair)?: A Lot Help needed to walk in hospital room?: Total Help needed climbing 3-5 steps with a railing? : Total 6 Click Score: 10    End of Session Equipment Utilized During Treatment: Gait belt;Oxygen Activity Tolerance: Patient tolerated treatment well Patient left: in chair;with call bell/phone within reach;with chair alarm set Nurse Communication: Mobility status PT Visit Diagnosis: Other abnormalities of gait and mobility (R26.89);Muscle weakness (generalized) (M62.81)     Time: 4034-7425 PT Time Calculation (min) (ACUTE ONLY): 24 min  Charges:    $Therapeutic Activity: 23-37 mins PT General Charges $$ ACUTE PT VISIT: 1 Visit                     Kendra Lee, PT, DPT Acute Rehabilitation Services Office [phone]    Kendra Lee 02/24/2023, 11:18 AM

## 2023-02-24 NOTE — TOC Progression Note (Addendum)
Transition of Care [facility]) - Progression Note    Patient Details  Name: Kendra Lee MRN: [id number] Date of Birth: 1955/05/02  Transition [facility]) CM/SW Contact  Kendra Frederick, LCSW Phone Number: 02/24/2023, 10:49 AM  Clinical Narrative:   Bed offers provided to pt on medicare choice document.  She is from [place], does not want [place], does not want [employer].  She would prefer [place] locations.  She asked CSW to speak with son Kendra Lee about option.    Unable to reach Kendra Lee, message sent asking for call back.   1120: TC Kendra Lee.  Discussed situation, bed offers, he would like to accept offer at [facility].  CSW waiting on confirmation that Kendra Lee can receive today.   1230: Message from [employer].  Will not have bed today, can receive pt tomorrow.    1300: Auth request submitted in [place] and approved: [id number], 3 days: 12/3-12/5.    Expected Discharge Plan: Skilled Nursing Facility Barriers to Discharge: Continued Medical Work up  Expected Discharge Plan and Services In-house Referral: Clinical Social Work Discharge Planning Services: CM Consult   Living arrangements for the past 2 months: Mobile Home Expected Discharge Date: 02/22/23                         HH Arranged: PT, OT HH Agency: [facility] Date [facility] Agency Contacted: 02/18/23 Time HH Agency Contacted: 1226 Representative spoke with at [facility] Agency: Kendra Lee   Social Determinants of Health (SDOH) Interventions SDOH Screenings   Food Insecurity: Patient Declined (02/18/2023)  Housing: High Risk (02/18/2023)  Transportation Needs: No Transportation Needs (02/18/2023)  Utilities: Not At Risk (02/18/2023)  Tobacco Use: High Risk (02/18/2023)    Readmission Risk Interventions    10/09/2020   11:28 AM 09/20/2020    2:01 PM  Readmission Risk Prevention Plan  Medication Screening  Complete  Transportation Screening Complete Complete  PCP or Specialist Appt within 5-7 Days Complete    Home Care Screening Complete   Medication Review (RN CM) Complete

## 2023-02-24 NOTE — Progress Notes (Addendum)
[facility] PROGRESS NOTE    Progress Note  Kendra Lee  ZOX:[id number] DOB: 1955-07-12 DOA: 02/17/2023 PCP: Benetta Spar, MD     Brief Narrative:   Kendra Lee is an 67 y.o. female with history of CVA, hypertension, ongoing tobacco abuse comes to the emergency department with acute onset of right hip pain while patient was working on her fireplace bending lost her balance fell on her right side, unable to ambulate, x-ray found hip nondisplaced right femoral fracture.    Assessment/Plan:   Close right intertrochanter fracture: Orthopedic surgery was consulted she status post surgical intervention on 02/18/2023. She has some bleeding at the surgical site dressing was applied Plavix was held. PT evaluated the patient recommended skilled nursing facility.   Acute blood loss anemia: Hemoglobin like around 11 trended down to like around 7, hemoglobin was checked was 6.9 repeated it was 7.3. Transfused 1 unit and post-transfusion hgb stable in the 9s   COPD with exacerbation Improving S/p 4 days of azithromycin 500, will d/c Plan on prednisone taper at discharge   Hypokalemia: It was repleted   Hypovolemic hyponatremia: Now resolved.   Essential hypertension: Bp soft this morning, will hold antihypertensives   History of CVA: Has been on asa/plavix since cryptogenic stroke in 2022, cardiology deferred ILR and DOAC 2/2 falls risk. Given she is how 2 years out from her stroke think prudent to d/c aspirin and continue plavix monotherapy.  Constipation Reports 1 week no BM - will order lactulose overnight, have asked nursing to give enema tomorrow morning if no BM overnight      DVT prophylaxis: plavix per ortho Family Communication: Son Kendra Lee updated telephonically 12/2 Status is: Inpatient Remains inpatient appropriate because: Awaiting skilled nursing facility placement.has bed tomorrow    Code Status:     Code Status Orders  (From  admission, onward)           Start     Ordered   02/17/23 2223  Full code  Continuous       Question:  By:  Answer:  Consent: discussion documented in EHR   02/17/23 2225           Code Status History     Date Active Date Inactive Code Status Order ID Comments User Context   10/04/2022 1445 10/09/2022 1857 Full Code [id number]  Shon Hale, MD ED   09/18/2020 0147 10/09/2020 1814 Full Code [id number]  Zierle-Ghosh, [place] B, DO ED         IV Access:   Peripheral IV   Procedures and diagnostic studies:   No results found.   Medical Consultants:   None.   Subjective:    Kendra Lee she is up with no complaints tolerating her diet. Says no bm in about a week  Objective:    Vitals:   02/24/23 0400 02/24/23 0758 02/24/23 0833 02/24/23 1358  BP: (!) 147/70 135/60  (!) 98/56  Pulse: 64 76  81  Resp: 18 15  17   Temp: 98.8 F (37.1 C) (!) 97.4 F (36.3 C)  97.7 F (36.5 C)  TempSrc: Oral Oral  Oral  SpO2: 91% 94% 95% 93%  Weight:      Height:       SpO2: 93 % O2 Flow Rate (L/min): 1 L/min   Intake/Output Summary (Last 24 hours) at 02/24/2023 1617 Last data filed at 02/24/2023 1500 Gross per 24 hour  Intake 240 ml  Output 650 ml  Net -410 ml  Filed Weights   02/17/23 1946 02/18/23 1456  Weight: 42.2 kg 42.2 kg    Exam: General exam: In no acute distress. Respiratory system: Good air movement and clear to auscultation. Cardiovascular system: S1 & S2 heard, RRR.   Gastrointestinal system: Abdomen is nondistended, soft and nontender.  Extremities: No pedal edema. Distal sensation intact. warm Skin: bandage right hip Psychiatry: Judgement and insight appear normal. Mood & affect appropriate.    Data Reviewed:    Labs: Basic Metabolic Panel: Recent Labs  Lab 02/19/23 0603 02/20/23 0519 02/21/23 0604 02/21/23 1023 02/22/23 0828  NA 138 134* 138 139 138  K 3.8 3.7 3.8 3.9 5.0  CL 106 105 105 104 103  CO2 21* 25 26 28 29   GLUCOSE  75 122* 98 107* 112*  BUN 8 10 10 9 16   CREATININE 0.90 0.60 0.61 0.56 0.63  CALCIUM 7.8* 8.2* 8.6* 8.9 9.0   GFR Estimated Creatinine Clearance: 44.2 mL/min (by C-G formula based on SCr of 0.63 mg/dL). Liver Function Tests: Recent Labs  Lab 02/17/23 2028  AST 22  ALT 24  ALKPHOS 46  BILITOT 0.7  PROT 6.7  ALBUMIN 3.9   No results for input(s): "LIPASE", "AMYLASE" in the last 168 hours. No results for input(s): "AMMONIA" in the last 168 hours. Coagulation profile No results for input(s): "INR", "PROTIME" in the last 168 hours. COVID-19 Labs  No results for input(s): "DDIMER", "FERRITIN", "LDH", "CRP" in the last 72 hours.  Lab Results  Component Value Date   SARSCOV2NAA NEGATIVE 09/17/2020    CBC: Recent Labs  Lab 02/17/23 2028 02/18/23 0655 02/22/23 0828 02/22/23 0952 02/23/23 0852 02/23/23 1718 02/24/23 0732  WBC 16.4*   < > 9.1 9.9 10.0 11.8* 9.5  NEUTROABS 15.0*  --   --   --   --   --   --   HGB 12.4   < > 6.9* 7.3* 7.0* 9.3* 9.6*  HCT 36.4   < > 21.9* 22.5* 22.4* 27.8* 28.9*  MCV 94.3   < > 96.5 95.7 97.8 92.4 94.4  PLT 306   < > 232 233 264 266 276   < > = values in this interval not displayed.   Cardiac Enzymes: Recent Labs  Lab 02/17/23 2028  CKTOTAL 112   BNP (last 3 results) No results for input(s): "PROBNP" in the last 8760 hours. CBG: No results for input(s): "GLUCAP" in the last 168 hours. D-Dimer: No results for input(s): "DDIMER" in the last 72 hours. Hgb A1c: No results for input(s): "HGBA1C" in the last 72 hours. Lipid Profile: No results for input(s): "CHOL", "HDL", "LDLCALC", "TRIG", "CHOLHDL", "LDLDIRECT" in the last 72 hours. Thyroid function studies: No results for input(s): "TSH", "T4TOTAL", "T3FREE", "THYROIDAB" in the last 72 hours.  Invalid input(s): "FREET3" Anemia work up: No results for input(s): "VITAMINB12", "FOLATE", "FERRITIN", "TIBC", "IRON", "RETICCTPCT" in the last 72 hours. Sepsis Labs: Recent Labs  Lab  02/22/23 0952 02/23/23 0852 02/23/23 1718 02/24/23 0732  WBC 9.9 10.0 11.8* 9.5   Microbiology Recent Results (from the past 240 hour(s))  Surgical pcr screen     Status: None   Collection Time: 02/18/23  1:01 AM   Specimen: Nasal Mucosa; Nasal Swab  Result Value Ref Range Status   MRSA, PCR NEGATIVE NEGATIVE Final   Staphylococcus aureus NEGATIVE NEGATIVE Final    Comment: (NOTE) The Xpert SA Assay (FDA approved for NASAL specimens in patients 100 years of age and older), is one component of a comprehensive  surveillance program. It is not intended to diagnose infection nor to guide or monitor treatment. Performed at [facility], [address], [place], [place] [address]      Medications:    atorvastatin  80 mg Oral Daily   azithromycin  500 mg Oral Daily   Chlorhexidine Gluconate Cloth  6 each Topical Daily   clopidogrel  75 mg Oral Daily   docusate sodium  100 mg Oral BID   ipratropium-albuterol  3 mL Nebulization BID   lactose free nutrition  237 mL Oral BID BM   lactulose  30 g Oral Once   mirtazapine  7.5 mg Oral QHS   multivitamin with minerals  1 tablet Oral Daily   polyethylene glycol  17 g Oral BID   predniSONE  30 mg Oral Q breakfast   thiamine  100 mg Oral Daily   Continuous Infusions:    LOS: 7 days   Silvano Bilis  [facility]  02/24/2023, 4:17 PM

## 2023-02-24 NOTE — Care Management Important Message (Signed)
Important Message  Patient Details  Name: Kendra Lee MRN: [id number] Date of Birth: [birth year]   Important Message Given:  Yes - Medicare IM     Sherilyn Banker 02/24/2023, 3:14 PM

## 2023-02-25 ENCOUNTER — Encounter (HOSPITAL_COMMUNITY): Payer: Self-pay

## 2023-02-25 ENCOUNTER — Other Ambulatory Visit: Payer: Self-pay

## 2023-02-25 ENCOUNTER — Emergency Department (HOSPITAL_COMMUNITY): Payer: 59

## 2023-02-25 ENCOUNTER — Emergency Department (HOSPITAL_COMMUNITY)
Admission: EM | Admit: 2023-02-25 | Discharge: 2023-02-26 | Disposition: A | Discharge status: Rehabilitation Facility | Payer: 59 | Attending: Emergency Medicine | Admitting: Emergency Medicine

## 2023-02-25 DIAGNOSIS — K5900 Constipation, unspecified: Secondary | ICD-10-CM | POA: Insufficient documentation

## 2023-02-25 DIAGNOSIS — Z79899 Other long term (current) drug therapy: Secondary | ICD-10-CM | POA: Insufficient documentation

## 2023-02-25 DIAGNOSIS — I82401 Acute embolism and thrombosis of unspecified deep veins of right lower extremity: Secondary | ICD-10-CM | POA: Diagnosis not present

## 2023-02-25 DIAGNOSIS — F17200 Nicotine dependence, unspecified, uncomplicated: Secondary | ICD-10-CM | POA: Diagnosis not present

## 2023-02-25 DIAGNOSIS — S7291XD Unspecified fracture of right femur, subsequent encounter for closed fracture with routine healing: Secondary | ICD-10-CM | POA: Diagnosis not present

## 2023-02-25 DIAGNOSIS — S72001D Fracture of unspecified part of neck of right femur, subsequent encounter for closed fracture with routine healing: Secondary | ICD-10-CM | POA: Diagnosis not present

## 2023-02-25 DIAGNOSIS — Z8673 Personal history of transient ischemic attack (TIA), and cerebral infarction without residual deficits: Secondary | ICD-10-CM | POA: Insufficient documentation

## 2023-02-25 DIAGNOSIS — I82491 Acute embolism and thrombosis of other specified deep vein of right lower extremity: Secondary | ICD-10-CM | POA: Diagnosis not present

## 2023-02-25 DIAGNOSIS — R278 Other lack of coordination: Secondary | ICD-10-CM | POA: Diagnosis not present

## 2023-02-25 DIAGNOSIS — W19XXXA Unspecified fall, initial encounter: Secondary | ICD-10-CM | POA: Diagnosis not present

## 2023-02-25 DIAGNOSIS — R0902 Hypoxemia: Secondary | ICD-10-CM | POA: Diagnosis not present

## 2023-02-25 DIAGNOSIS — M6281 Muscle weakness (generalized): Secondary | ICD-10-CM | POA: Diagnosis not present

## 2023-02-25 DIAGNOSIS — Z7902 Long term (current) use of antithrombotics/antiplatelets: Secondary | ICD-10-CM | POA: Diagnosis not present

## 2023-02-25 DIAGNOSIS — Z23 Encounter for immunization: Secondary | ICD-10-CM | POA: Diagnosis not present

## 2023-02-25 DIAGNOSIS — I10 Essential (primary) hypertension: Secondary | ICD-10-CM | POA: Insufficient documentation

## 2023-02-25 DIAGNOSIS — Z7185 Encounter for immunization safety counseling: Secondary | ICD-10-CM | POA: Diagnosis not present

## 2023-02-25 DIAGNOSIS — K5641 Fecal impaction: Secondary | ICD-10-CM | POA: Diagnosis not present

## 2023-02-25 DIAGNOSIS — I69351 Hemiplegia and hemiparesis following cerebral infarction affecting right dominant side: Secondary | ICD-10-CM | POA: Diagnosis not present

## 2023-02-25 DIAGNOSIS — I878 Other specified disorders of veins: Secondary | ICD-10-CM | POA: Diagnosis not present

## 2023-02-25 DIAGNOSIS — Z7952 Long term (current) use of systemic steroids: Secondary | ICD-10-CM | POA: Diagnosis not present

## 2023-02-25 DIAGNOSIS — Z7401 Bed confinement status: Secondary | ICD-10-CM | POA: Diagnosis not present

## 2023-02-25 DIAGNOSIS — J441 Chronic obstructive pulmonary disease with (acute) exacerbation: Secondary | ICD-10-CM | POA: Diagnosis not present

## 2023-02-25 DIAGNOSIS — D649 Anemia, unspecified: Secondary | ICD-10-CM | POA: Diagnosis not present

## 2023-02-25 DIAGNOSIS — J449 Chronic obstructive pulmonary disease, unspecified: Secondary | ICD-10-CM | POA: Diagnosis not present

## 2023-02-25 DIAGNOSIS — S72141D Displaced intertrochanteric fracture of right femur, subsequent encounter for closed fracture with routine healing: Secondary | ICD-10-CM | POA: Diagnosis not present

## 2023-02-25 DIAGNOSIS — E440 Moderate protein-calorie malnutrition: Secondary | ICD-10-CM | POA: Diagnosis not present

## 2023-02-25 DIAGNOSIS — R531 Weakness: Secondary | ICD-10-CM | POA: Diagnosis not present

## 2023-02-25 DIAGNOSIS — S72001A Fracture of unspecified part of neck of right femur, initial encounter for closed fracture: Secondary | ICD-10-CM | POA: Diagnosis not present

## 2023-02-25 LAB — CBC WITH DIFFERENTIAL/PLATELET
Abs Immature Granulocytes: 0.08 10*3/uL — ABNORMAL HIGH (ref 0.00–0.07)
Basophils Absolute: 0 10*3/uL (ref 0.0–0.1)
Basophils Relative: 0 %
Eosinophils Absolute: 0 10*3/uL (ref 0.0–0.5)
Eosinophils Relative: 0 %
HCT: 26.6 % — ABNORMAL LOW (ref 36.0–46.0)
Hemoglobin: 8.8 g/dL — ABNORMAL LOW (ref 12.0–15.0)
Immature Granulocytes: 1 %
Lymphocytes Relative: 17 %
Lymphs Abs: 1.7 10*3/uL (ref 0.7–4.0)
MCH: 31.2 pg (ref 26.0–34.0)
MCHC: 33.1 g/dL (ref 30.0–36.0)
MCV: 94.3 fL (ref 80.0–100.0)
Monocytes Absolute: 0.6 10*3/uL (ref 0.1–1.0)
Monocytes Relative: 6 %
Neutro Abs: 7.4 10*3/uL (ref 1.7–7.7)
Neutrophils Relative %: 76 %
Platelets: 321 10*3/uL (ref 150–400)
RBC: 2.82 MIL/uL — ABNORMAL LOW (ref 3.87–5.11)
RDW: 15 % (ref 11.5–15.5)
WBC: 9.9 10*3/uL (ref 4.0–10.5)
nRBC: 0 % (ref 0.0–0.2)

## 2023-02-25 LAB — COMPREHENSIVE METABOLIC PANEL
ALT: 17 U/L (ref 0–44)
AST: 21 U/L (ref 15–41)
Albumin: 2.7 g/dL — ABNORMAL LOW (ref 3.5–5.0)
Alkaline Phosphatase: 33 U/L — ABNORMAL LOW (ref 38–126)
Anion gap: 9 (ref 5–15)
BUN: 19 mg/dL (ref 8–23)
CO2: 29 mmol/L (ref 22–32)
Calcium: 8.6 mg/dL — ABNORMAL LOW (ref 8.9–10.3)
Chloride: 99 mmol/L (ref 98–111)
Creatinine, Ser: 0.55 mg/dL (ref 0.44–1.00)
GFR, Estimated: >60 mL/min (ref >60)
Glucose, Bld: 104 mg/dL — ABNORMAL HIGH (ref 70–99)
Potassium: 4 mmol/L (ref 3.5–5.1)
Sodium: 137 mmol/L (ref 135–145)
Total Bilirubin: 0.7 mg/dL (ref <1.2)
Total Protein: 4.9 g/dL — ABNORMAL LOW (ref 6.5–8.1)

## 2023-02-25 MED ORDER — BISACODYL 5 MG PO TBEC: 10.0000 mg | DELAYED_RELEASE_TABLET | Freq: Every day | ORAL | Status: DC

## 2023-02-25 MED ORDER — BISACODYL 5 MG PO TBEC: 10.0000 mg | DELAYED_RELEASE_TABLET | Freq: Every day | ORAL | Status: DC | PRN

## 2023-02-25 MED ORDER — METOPROLOL TARTRATE 5 MG/5ML IV SOLN: 5.0000 mg | INTRAVENOUS | Status: DC | PRN

## 2023-02-25 MED ORDER — PREDNISONE 10 MG PO TABS: 30.0000 mg | ORAL_TABLET | Freq: Every day | ORAL | Status: AC

## 2023-02-25 MED ORDER — MAGNESIUM CITRATE PO SOLN
1.0000 | Freq: Once | ORAL | Status: AC
  Administered 2023-02-25: 1 via ORAL
  Filled 2023-02-25: qty 296

## 2023-02-25 MED ORDER — BOOST PLUS PO LIQD
237.0000 mL | Freq: Three times a day (TID) | ORAL | Status: DC
  Filled 2023-02-25: qty 237

## 2023-02-25 MED ORDER — HYDRALAZINE HCL 20 MG/ML IJ SOLN: 10.0000 mg | INTRAMUSCULAR | Status: DC | PRN

## 2023-02-25 NOTE — Progress Notes (Signed)
Occupational Therapy Treatment Patient Details Name: Kendra Lee MRN: [id number] DOB: 08-25-1955 Today's Date: 02/25/2023   History of present illness 67 yo female presents to [facility] on 11/25 s/p fall, sustained R nondisplaced femoral intertrochanteric fx. S/p R hip IMN 11/26. PMH includes CVA, HTN, tobacco abuse.   OT comments  Patient demonstrating good gains with OT treatment with mod assist to get to EOB and patient able to stand into Stedy with min assist +2 for transfer to recliner and patient able to stand from [place] pads with min assist of one. Patient performed grooming and BUE HEP while seated in recliner. Patient will benefit from continued inpatient follow up therapy, <3 hours/day for continued OT services to address bathing, dressing, and functional transfers. Acute OT to continue to follow.       If plan is discharge home, recommend the following:  Assistance with cooking/housework;A lot of help with walking and/or transfers;A lot of help with bathing/dressing/bathroom;Two people to help with walking and/or transfers;Help with stairs or ramp for entrance   Equipment Recommendations  None recommended by OT (TBD at next level of care)    Recommendations for Other Services      Precautions / Restrictions Precautions Precautions: Fall Restrictions Weight Bearing Restrictions: Yes RLE Weight Bearing: Weight bearing as tolerated Other Position/Activity Restrictions: no mobility restrictions       Mobility Bed Mobility Overal bed mobility: Needs Assistance Bed Mobility: Supine to Sit   Sidelying to sit: Mod assist, Used rails, HOB elevated       General bed mobility comments: cues for rail use and min assist due to assistance with RLE and trunk    Transfers Overall transfer level: Needs assistance Equipment used: Ambulation equipment used Transfers: Sit to/from Stand, Bed to chair/wheelchair/BSC Sit to Stand: Min assist, +2 physical assistance            General transfer comment: min assist to stand from EOB into stedy and min assist of one to stand from [employer] via Lift Equipment: Stedy   Balance Overall balance assessment: Needs assistance, History of Falls Sitting-balance support: Feet supported, No upper extremity supported Sitting balance-Leahy Scale: Fair Sitting balance - Comments: sitting EOB   Standing balance support: Bilateral upper extremity supported, During functional activity, Reliant on assistive device for balance Standing balance-Leahy Scale: Poor Standing balance comment: reliant on UE support                           ADL either performed or assessed with clinical judgement   ADL Overall ADL's : Needs assistance/impaired     Grooming: Wash/dry hands;Wash/dry face;Oral care;Set up;Sitting                                      Extremity/Trunk Assessment              Vision       Perception     Praxis      Cognition Arousal: Alert Behavior During Therapy: WFL for tasks assessed/performed Overall Cognitive Status: Within Functional Limits for tasks assessed                                          Exercises Exercises: General Upper Extremity General Exercises - Upper Extremity Shoulder Horizontal  ABduction: Strengthening, Both, 10 reps, Seated, Theraband Theraband Level (Shoulder Horizontal Abduction): Level 1 (Yellow) Elbow Flexion: Strengthening, Both, 10 reps, Seated, Theraband Theraband Level (Elbow Flexion): Level 1 (Yellow) Elbow Extension: Strengthening, Both, 10 reps, Seated, Theraband Theraband Level (Elbow Extension): Level 1 (Yellow)    Shoulder Instructions       General Comments      Pertinent Vitals/ Pain       Pain Assessment Pain Assessment: Faces Faces Pain Scale: Hurts little more Pain Location: R hip, incision site Pain Descriptors / Indicators: Grimacing, Guarding, Discomfort Pain Intervention(s): Monitored  during session, Limited activity within patient's tolerance, Premedicated before session, Repositioned  Home Living                                          Prior Functioning/Environment              Frequency  Min 1X/week        Progress Toward Goals  OT Goals(current goals can now be found in the care plan section)  Progress towards OT goals: Progressing toward goals  Acute Rehab OT Goals Patient Stated Goal: get more rehab OT Goal Formulation: With patient Time For Goal Achievement: 03/05/23 Potential to Achieve Goals: Fair ADL Goals Pt Will Perform Lower Body Bathing: sitting/lateral leans;with min assist Pt Will Perform Lower Body Dressing: sit to/from stand;with min assist Pt Will Transfer to Toilet: stand pivot transfer;with mod assist;bedside commode Pt/caregiver will Perform Home Exercise Program: Increased strength;Both right and left upper extremity;With theraband;With Supervision;With written HEP provided Additional ADL Goal #1: Pt will demonstrate improved use of IS by obtaining of volume to indicated improved cardiopulmanary system  Plan      Co-evaluation                 AM-PAC OT "6 Clicks" Daily Activity     Outcome Measure   Help from another person eating meals?: None Help from another person taking care of personal grooming?: A Little Help from another person toileting, which includes using toliet, bedpan, or urinal?: Total Help from another person bathing (including washing, rinsing, drying)?: A Lot Help from another person to put on and taking off regular upper body clothing?: A Little Help from another person to put on and taking off regular lower body clothing?: Total 6 Click Score: 14    End of Session Equipment Utilized During Treatment: Gait belt;Other (comment) Antony Salmon)  OT Visit Diagnosis: Unsteadiness on feet (R26.81);Other abnormalities of gait and mobility (R26.89);History of falling (Z91.81);Muscle  weakness (generalized) (M62.81);Pain Pain - Right/Left: Right Pain - part of body: Hip   Activity Tolerance Patient tolerated treatment well   Patient Left in chair;with call bell/phone within reach;with chair alarm set   Nurse Communication Mobility status;Need for lift equipment        Time: 2536-6440 OT Time Calculation (min): 24 min  Charges: OT Treatments $Self Care/Home Management : 8-22 mins $Therapeutic Exercise: 8-22 mins  Alfonse Flavors, OTA Acute Rehabilitation Services  Office [phone]   Dewain Penning 02/25/2023, 12:55 PM

## 2023-02-25 NOTE — TOC Progression Note (Addendum)
Transition of Care [facility]) - Progression Note    Patient Details  Name: Kendra Lee MRN: [id number] Date of Birth: 16-Aug-1955  Transition of Care [facility]) CM/SW Contact  Lorri Frederick, LCSW Phone Number: 02/25/2023, 10:36 AM  Clinical Narrative:   CSW confirmed with [facility] that they can receive pt today.  MD notified.  1210: Per RN, pt needs BM prior to transport.  PTAR on hold  1520: Per MD, pt can DC with no BM.  CSW spoke with [facility], updated her, she is ready to receive pt.   Expected Discharge Plan: Skilled Nursing Facility Barriers to Discharge: Continued Medical Work up  Expected Discharge Plan and Services In-house Referral: Clinical Social Work Discharge Planning Services: CM Consult   Living arrangements for the past 2 months: Mobile Home Expected Discharge Date: 02/22/23                         HH Arranged: PT, OT HH Agency: [facility] Date [facility] Agency Contacted: 02/18/23 Time HH Agency Contacted: 1226 Representative spoke with at [facility] Agency: Amy   Social Determinants of Health (SDOH) Interventions SDOH Screenings   Food Insecurity: Patient Declined (02/18/2023)  Housing: High Risk (02/18/2023)  Transportation Needs: No Transportation Needs (02/18/2023)  Utilities: Not At Risk (02/18/2023)  Tobacco Use: High Risk (02/18/2023)    Readmission Risk Interventions    10/09/2020   11:28 AM 09/20/2020    2:01 PM  Readmission Risk Prevention Plan  Medication Screening  Complete  Transportation Screening Complete Complete  PCP or Specialist Appt within 5-7 Days Complete   Home Care Screening Complete   Medication Review (RN CM) Complete

## 2023-02-25 NOTE — ED Provider Notes (Signed)
[place] EMERGENCY DEPARTMENT AT [facility] Provider Note   CSN: [id number] Arrival date & time: 02/25/23  2213     History  Chief Complaint  Patient presents with   Constipation    Kendra Lee is a 67 y.o. female.  Patient with past medical history significant for right-sided IM nail due to intertrochanteric fracture on November 26, discharge from hospital earlier today to rehab, previous CVA, hypertension presents to the emergency department complaining of constipation.  She states that the facility she went to will not except her until she has a bowel movement.  She states she has had no bowel movement since admission to the hospital.  She denies abdominal pain, nausea, vomiting.  She was discharged with a prescription for hydrocodone for pain control and Colace for related constipation   Constipation      Home Medications Prior to Admission medications   Medication Sig Start Date End Date Taking? Authorizing Provider  acetaminophen (TYLENOL) 650 MG CR tablet Take 1,300 mg by mouth every 8 (eight) hours as needed for pain.    [provider]  alendronate (FOSAMAX) 70 MG tablet Take 70 mg by mouth once a week. 05/02/22   [provider]  amLODipine (NORVASC) 10 MG tablet Take 1 tablet (10 mg total) by mouth daily. 02/22/23   Susa Griffins, MD  atorvastatin (LIPITOR) 80 MG tablet Take 80 mg by mouth daily. 12/19/22   [provider]  clopidogrel (PLAVIX) 75 MG tablet Take 1 tablet (75 mg total) by mouth daily. 02/25/23   Marinda Elk, MD  docusate sodium (COLACE) 100 MG capsule Take 100 mg by mouth 2 (two) times daily.    [provider]  ipratropium-albuterol (DUONEB) 0.5-2.5 (3) MG/3ML SOLN Take 3 mLs by nebulization every 6 (six) hours. 02/22/23   Susa Griffins, MD  losartan (COZAAR) 100 MG tablet Take 1 tablet (100 mg total) by mouth daily. 02/22/23 03/24/23  Susa Griffins, MD  mirtazapine (REMERON) 7.5 MG  tablet Take 7.5 mg by mouth at bedtime. 06/09/22   [provider]  predniSONE (DELTASONE) 10 MG tablet Take 3 tablets (30 mg total) by mouth daily with breakfast for 2 days. Takes 2 tabs for 1 days, then 1 tab for 1 days, and then stop. 02/25/23 02/27/23  Amin, Ankit C, MD  thiamine (VITAMIN B1) 100 MG tablet Take 1 tablet (100 mg total) by mouth daily. 10/09/22   Cleora Fleet, MD      Allergies    Patient has no known allergies.    Review of Systems   Review of Systems  Gastrointestinal:  Positive for constipation.    Physical Exam Updated Vital Signs BP (!) 168/74 (BP Location: Right Arm)   Pulse 64   Temp 98.6 F (37 C)   Resp 17   LMP  (LMP Unknown)   SpO2 94%  Physical Exam Vitals and nursing note reviewed.  Constitutional:      General: She is not in acute distress.    Appearance: She is well-developed.  HENT:     Head: Normocephalic and atraumatic.  Eyes:     Conjunctiva/sclera: Conjunctivae normal.  Cardiovascular:     Rate and Rhythm: Normal rate and regular rhythm.     Heart sounds: No murmur heard. Pulmonary:     Effort: Pulmonary effort is normal. No respiratory distress.     Breath sounds: Normal breath sounds.  Abdominal:     Palpations: Abdomen is soft.  Tenderness: There is no abdominal tenderness.  Musculoskeletal:        General: No swelling.     Cervical back: Neck supple.  Skin:    General: Skin is warm and dry.     Capillary Refill: Capillary refill takes less than 2 seconds.  Neurological:     Mental Status: She is alert.  Psychiatric:        Mood and Affect: Mood normal.     ED Results / Procedures / Treatments   Labs (all labs ordered are listed, but only abnormal results are displayed) Labs Reviewed  COMPREHENSIVE METABOLIC PANEL - Abnormal; Notable for the following components:      Result Value   Glucose, Bld 104 (*)    Calcium 8.6 (*)    Total Protein 4.9 (*)    Albumin 2.7 (*)    Alkaline Phosphatase 33 (*)     All other components within normal limits  CBC WITH DIFFERENTIAL/PLATELET - Abnormal; Notable for the following components:   RBC 2.82 (*)    Hemoglobin 8.8 (*)    HCT 26.6 (*)    Abs Immature Granulocytes 0.08 (*)    All other components within normal limits    EKG None  Radiology CT ABDOMEN PELVIS W CONTRAST  Result Date: 02/26/2023 CLINICAL DATA:  Decreased bowel movements. Clinical concern for bowel obstruction. EXAM: CT ABDOMEN AND PELVIS WITH CONTRAST TECHNIQUE: Multidetector CT imaging of the abdomen and pelvis was performed using the standard protocol following bolus administration of intravenous contrast. RADIATION DOSE REDUCTION: This exam was performed according to the departmental dose-optimization program which includes automated exposure control, adjustment of the mA and/or kV according to patient size and/or use of iterative reconstruction technique. CONTRAST:  65mL OMNIPAQUE IOHEXOL 350 MG/ML SOLN COMPARISON:  10/04/2022 FINDINGS: Lower chest: Centrilobular emphsyema noted. Dependent atelectasis noted bilaterally. Hepatobiliary: No suspicious focal abnormality within the liver parenchyma. There is no evidence for gallstones, gallbladder wall thickening, or pericholecystic fluid. No intrahepatic or extrahepatic biliary dilation. Pancreas: No focal mass lesion. No dilatation of the main duct. No intraparenchymal cyst. No peripancreatic edema. Spleen: No splenomegaly. No suspicious focal mass lesion. Adrenals/Urinary Tract: No adrenal nodule or mass. Right kidney unremarkable. Left renal atrophy again noted with interval development of left-sided hydronephrosis. No discernible hydroureter. Bladder is mildly distended. Stomach/Bowel: Stomach is decompressed, accentuating wall thickness. Duodenum is normally positioned as is the ligament of Treitz. No small bowel wall thickening. No small bowel dilatation. Cecal tip is low in the right pelvis. Terminal ileum unremarkable. The appendix is  not well visualized, but there is no edema or inflammation in the region of the cecal tip to suggest appendicitis. Large stool volume throughout the colon. Vascular/Lymphatic: There is advanced atherosclerotic calcification of the abdominal aorta without aneurysm. Advanced atherosclerotic disease is noted in the common and external iliac arteries bilaterally. Patency of the external iliac arteries cannot be confirmed on this exam. There is no gastrohepatic or hepatoduodenal ligament lymphadenopathy. No retroperitoneal or mesenteric lymphadenopathy. No pelvic sidewall lymphadenopathy. Reproductive: There is no adnexal mass. Other: No intraperitoneal free fluid. Musculoskeletal: Bones are diffusely demineralized. Fixation hardware noted proximal right femur No worrisome lytic or sclerotic osseous abnormality. Body wall edema noted lower abdomen and pelvis, right greater than left. 2.1 x 2.0 cm focal soft tissue density in the subcutaneous fat of the upper right hip region is probably a small hematoma given the history of recent surgery. IMPRESSION: 1. Large stool volume throughout the colon. Imaging features compatible with clinical  history of constipation. 2. No evidence for bowel obstruction. 3. Left renal atrophy with interval development of left-sided hydronephrosis. No discernible hydroureter. Etiology for left-sided hydronephrosis not evident on the current study. 4. Body wall edema lower abdomen and pelvis, right greater than left. 2.1 x 2.0 cm focal soft tissue density in the subcutaneous fat of the upper right hip region is probably a small hematoma given the history of recent surgery. 5.  Aortic Atherosclerosis (ICD10-I70.0). Electronically Signed   By: Kennith Center M.D.   On: 02/26/2023 04:50   DG Abdomen 1 View  Result Date: 02/25/2023 CLINICAL DATA:  Constipation no recent bowel movement EXAM: ABDOMEN - 1 VIEW COMPARISON:  02/17/2023 FINDINGS: Mild diffuse increased bowel gas without definitive  obstruction. Large volume stool in the ascending transverse and proximal descending colon. Phleboliths in the pelvis. Intramedullary rod in the right femur. Vascular calcifications. IMPRESSION: Mild diffuse increased bowel gas without definitive obstruction. Large volume stool in the ascending transverse and proximal descending colon suggesting constipation. Electronically Signed   By: Jasmine Pang M.D.   On: 02/25/2023 22:49    Procedures Procedures    Medications Ordered in ED Medications  HYDROcodone-acetaminophen (NORCO/VICODIN) 5-325 MG per tablet 1 tablet (1 tablet Oral Given 02/26/23 0234)  sorbitol, magnesium hydroxide, mineral oil, glycerin (SMOG) enema (960 mLs Rectal Given 02/26/23 0543)  iohexol (OMNIPAQUE) 350 MG/ML injection 65 mL (65 mLs Intravenous Contrast Given 02/26/23 0402)    ED Course/ Medical Decision Making/ A&P Clinical Course as of 02/26/23 0640  Wed Feb 26, 2023  8657 Constipation. SMOG enema. Talk to hospitalist. If SMOG enema not successful, can be readmitted to hospital. Reassess after enema.  [JR]    Clinical Course User Index [JR] Gareth Eagle, PA-C                                 Medical Decision Making Amount and/or Complexity of Data Reviewed Labs: ordered. Radiology: ordered.  Risk Prescription drug management.   This patient presents to the ED for concern of constipation, this involves an extensive number of treatment options, and is a complaint that carries with it a high risk of complications and morbidity.     Co morbidities that complicate the patient evaluation  Recent orthopedic surgery, narcotic pain medication use   Additional history obtained:  Additional history obtained from EMS External records from outside source obtained and reviewed including discharge summary from today   Lab Tests:  I Ordered, and personally interpreted labs.  The pertinent results include: Hemoglobin 8.8 consistent with recent values, grossly  unremarkable CMP   Imaging Studies ordered:  I ordered imaging studies including KUB I independently visualized and interpreted imaging which showed  Mild diffuse increased bowel gas without definitive obstruction.  Large volume stool in the ascending transverse and proximal  descending colon suggesting constipation.  Further chart review showed history of SBO.  With patient still having no bowel movement after initial enema ordered CT abdomen pelvis to evaluate for possible bowel obstruction. 1. Large stool volume throughout the colon. Imaging features  compatible with clinical history of constipation.  2. No evidence for bowel obstruction.  3. Left renal atrophy with interval development of left-sided  hydronephrosis. No discernible hydroureter. Etiology for left-sided  hydronephrosis not evident on the current study.  4. Body wall edema lower abdomen and pelvis, right greater than  left. 2.1 x 2.0 cm focal soft tissue density in the  subcutaneous fat  of the upper right hip region is probably a small hematoma given the  history of recent surgery.  5.  Aortic Atherosclerosis   I agree with the radiologist interpretation   Consultations Obtained:  I requested consultation with the hospitalist,  and discussed lab and imaging findings as well as pertinent plan - they recommend: Attempt smog enema in addition to the soapsuds enema.  If patient still fails to have a bowel movement can be contacted for readmission   Problem List / ED Course / Critical interventions / Medication management   I ordered medication including soapsuds enema and smog enema for constipation, Norco for postop pain Reevaluation of the patient after these medicines showed that the patient stayed the same I have reviewed the patients home medicines and have made adjustments as needed   Social Determinants of Health:  Social Determinants of Health with Concerns   Tobacco Use: High Risk (02/25/2023)    Patient History    Smoking Tobacco Use: Every Day    Smokeless Tobacco Use: Never    Passive Exposure: Not on file  Financial Resource Strain: Not on file  Food Insecurity: Patient Declined (02/18/2023)   Hunger Vital Sign    Worried About Programme researcher, broadcasting/film/video in the Last Year: Patient declined    Barista in the Last Year: Patient declined  Physical Activity: Not on file  Stress: Not on file  Social Connections: Not on file  Depression (ZOX0-9): Not on file  Alcohol Screen: Not on file  Housing: High Risk (02/18/2023)   Housing    Last Housing Risk Score: 98  Health Literacy: Not on file      Test / Admission - Considered:  Feel this is likely constipation secondary to narcotic use for postop pain.  She is beginning to have a bowel movement after the smog enema.  Feel the patient will likely be able to discharge back to her rehab facility.  Patient care being transferred to John R hypertensin, PA-C at shift handoff.  As long as patient continues to move her bowels she should go to discharge to rehab.  If she is unable to have significant bowel movement may need readmission to hospital.         Final Clinical Impression(s) / ED Diagnoses Final diagnoses:  Constipation, unspecified constipation type    Rx / DC Orders ED Discharge Orders     None         Pamala Duffel 02/26/23 0640    Gloris Manchester, MD 02/26/23 [phone]

## 2023-02-25 NOTE — ED Provider Notes (Incomplete)
[place] EMERGENCY DEPARTMENT AT [facility] Provider Note   CSN: [id number] Arrival date & time: 02/25/23  2213     History {Add pertinent medical, surgical, social history, OB history to HPI:1} Chief Complaint  Patient presents with  . Constipation    Kendra Lee is a 67 y.o. female.   Constipation      Home Medications Prior to Admission medications   Medication Sig Start Date End Date Taking? Authorizing Provider  acetaminophen (TYLENOL) 650 MG CR tablet Take 1,300 mg by mouth every 8 (eight) hours as needed for pain.    [provider]  alendronate (FOSAMAX) 70 MG tablet Take 70 mg by mouth once a week. 05/02/22   [provider]  amLODipine (NORVASC) 10 MG tablet Take 1 tablet (10 mg total) by mouth daily. 02/22/23   Susa Griffins, MD  atorvastatin (LIPITOR) 80 MG tablet Take 80 mg by mouth daily. 12/19/22   [provider]  clopidogrel (PLAVIX) 75 MG tablet Take 1 tablet (75 mg total) by mouth daily. 02/25/23   Marinda Elk, MD  docusate sodium (COLACE) 100 MG capsule Take 100 mg by mouth 2 (two) times daily.    [provider]  HYDROcodone-acetaminophen (NORCO/VICODIN) 5-325 MG tablet Take 1 tablet by mouth every 6 (six) hours as needed for up to 3 days for severe pain (pain score 7-10). 02/22/23 02/25/23  Susa Griffins, MD  ipratropium-albuterol (DUONEB) 0.5-2.5 (3) MG/3ML SOLN Take 3 mLs by nebulization every 6 (six) hours. 02/22/23   Susa Griffins, MD  losartan (COZAAR) 100 MG tablet Take 1 tablet (100 mg total) by mouth daily. 02/22/23 03/24/23  Susa Griffins, MD  mirtazapine (REMERON) 7.5 MG tablet Take 7.5 mg by mouth at bedtime. 06/09/22   [provider]  predniSONE (DELTASONE) 10 MG tablet Take 3 tablets (30 mg total) by mouth daily with breakfast for 2 days. Takes 2 tabs for 1 days, then 1 tab for 1 days, and then stop. 02/25/23 02/27/23  Amin, Ankit C, MD  thiamine (VITAMIN B1) 100  MG tablet Take 1 tablet (100 mg total) by mouth daily. 10/09/22   Cleora Fleet, MD      Allergies    Patient has no known allergies.    Review of Systems   Review of Systems  Gastrointestinal:  Positive for constipation.    Physical Exam Updated Vital Signs BP (!) 166/86   Pulse 72   Temp 98.8 F (37.1 C) (Oral)   Resp 18   LMP  (LMP Unknown)   SpO2 91%  Physical Exam  ED Results / Procedures / Treatments   Labs (all labs ordered are listed, but only abnormal results are displayed) Labs Reviewed  CBC WITH DIFFERENTIAL/PLATELET - Abnormal; Notable for the following components:      Result Value   RBC 2.82 (*)    Hemoglobin 8.8 (*)    HCT 26.6 (*)    Abs Immature Granulocytes 0.08 (*)    All other components within normal limits  COMPREHENSIVE METABOLIC PANEL    EKG None  Radiology DG Abdomen 1 View  Result Date: 02/25/2023 CLINICAL DATA:  Constipation no recent bowel movement EXAM: ABDOMEN - 1 VIEW COMPARISON:  02/17/2023 FINDINGS: Mild diffuse increased bowel gas without definitive obstruction. Large volume stool in the ascending transverse and proximal descending colon. Phleboliths in the pelvis. Intramedullary rod in the right femur. Vascular calcifications. IMPRESSION: Mild diffuse increased bowel gas without definitive obstruction. Large volume stool in the ascending  transverse and proximal descending colon suggesting constipation. Electronically Signed   By: Jasmine Pang M.D.   On: 02/25/2023 22:49    Procedures Procedures  {Document cardiac monitor, telemetry assessment procedure when appropriate:1}  Medications Ordered in ED Medications - No data to display  ED Course/ Medical Decision Making/ A&P   {   Click here for ABCD2, HEART and other calculatorsREFRESH Note before signing :1}                              Medical Decision Making Amount and/or Complexity of Data Reviewed Labs: ordered. Radiology: ordered.   ***  {Document critical  care time when appropriate:1} {Document review of labs and clinical decision tools ie heart score, Chads2Vasc2 etc:1}  {Document your independent review of radiology images, and any outside records:1} {Document your discussion with family members, caretakers, and with consultants:1} {Document social determinants of health affecting pt's care:1} {Document your decision making why or why not admission, treatments were needed:1} Final Clinical Impression(s) / ED Diagnoses Final diagnoses:  None    Rx / DC Orders ED Discharge Orders     None

## 2023-02-25 NOTE — Hospital Course (Addendum)
Brief Narrative:  68 y.o. female with history of CVA, hypertension, ongoing tobacco abuse comes to the emergency department with acute onset of right hip pain while patient was working on her fireplace bending lost her balance fell on her right side, unable to ambulate, x-ray found hip nondisplaced right femoral fracture.  Patient underwent ORIF on 11/26 and postop had some bleeding around surgical site requiring for Plavix to be held.  During hospitalization also required PRBC transfusion.  Hospital course further complicated by COPD exacerbation requiring prednisone, bronchodilators and azithromycin.  PT/OT is recommending SNF.   Assessment & Plan:  Principal Problem:   Closed right hip fracture [facility]) Active Problems:   Hypokalemia   Hyponatremia   Essential hypertension   Dyslipidemia   Malnutrition of moderate degree    Close right intertrochanter fracture: Status post ORIF/IMN of the right hip on 11/26.  Postop suffered from blood loss requiring PRBC transfusion.  Plavix okay to be resumed upon discharge and outpatient follow-up with orthopedic.  Currently recommending daily wound care as needed, reinforce dressing as needed, unrestricted ROM, weightbearing as tolerated.   Acute blood loss anemia: Baseline hemoglobin was 11, postoperatively dropped out to 6.9 requiring PRBC transfusion.  Now stable around 9.6.   COPD with exacerbation  improved.  Completed antibiotic course.  On prednisone x 2 more days   Hypokalemia: It was repleted   Hypovolemic hyponatremia: Now resolved.   Essential hypertension: Resume meds as above.    History of CVA: Used to be on aspirin and Plavix.  He was cryptogenic stroke.  High risk of falls.  She is about 2 years out.  Will discontinue aspirin and continue only Plavix upon discharge    Constipation Dulcolax       DVT prophylaxis: plavix per ortho Family Communication: Son Ramon Dredge updated telephonically 12/2 Status is: Inpatient Remains  inpatient appropriate because: DC today    Subjective: Doing well no other complaints.    Examination:  General exam: Appears calm and comfortable  Respiratory system: Clear to auscultation. Respiratory effort normal. Cardiovascular system: S1 & S2 heard, RRR. No JVD, murmurs, rubs, gallops or clicks. No pedal edema. Gastrointestinal system: Abdomen is nondistended, soft and nontender. No organomegaly or masses felt. Normal bowel sounds heard. Central nervous system: Alert and oriented. No focal neurological deficits. Extremities: Symmetric 5 x 5 power. Skin: No rashes, lesions or ulcers. Dressing looks ok Psychiatry: Judgement and insight appear normal. Mood & affect appropriate.

## 2023-02-25 NOTE — Progress Notes (Signed)
Nutrition Follow-up  DOCUMENTATION CODES:   Non-severe (moderate) malnutrition in context of chronic illness, Underweight  INTERVENTION:   -Recommend to continue to provide  liberalized due to nutrition status and poor intakes, provide dysphagia III for ease of chewing.  -Increase Boost Plus PO to TID, each carton provides 360 calories, 14 gm protein.  -Provide between meal nourishments with protein to aide in meeting estimated needs.  -Continue MVI/Minerals-1 Tab daily for micronutrient support due to prolonged inadequate intakes.  NUTRITION DIAGNOSIS:   Moderate Malnutrition related to chronic illness, poor appetite as evidenced by per patient/family report, moderate fat depletion, mild muscle depletion, moderate muscle depletion.  -ongoing  GOAL:   Patient will meet greater than or equal to 90% of their needs  -progressing  MONITOR:   PO intake, Labs, Supplement acceptance, Weight trends, Diet advancement, Skin  REASON FOR ASSESSMENT:   Follow up  ASSESSMENT:   66 y/o female presented with acute onset of right hip pain after a fall at home.  PMH: CVA, HTN, ongoing tobacco abuse, dysphagia, hypokalemia, gastrostomy tube.  Intakes recorded very poor, average 25% x 6 meals. Patient denies nausea, complains of lack of appetite. States she is drinking the Boost when it is provided. Reviewed nutrition recommendations to support healing and to maintain lean body mass.  Needs updated weight. Edema 1+ RLE.  Possible discharge to SNF when she has a BM.   Medications reviewed and include dulcolax, colace, remeron, MVI/Minerals-1 Tab daily, prednisone, thiamine 100 mg daily.  Labs reviewed  Diet Order:   Diet Order             Diet - low sodium heart healthy           Diet general           DIET DYS 3 Room service appropriate? Yes with Assist; Fluid consistency: Thin  Diet effective now                   EDUCATION NEEDS:   Education needs have been  addressed  Skin:  Skin Assessment: Skin Integrity Issues: Skin Integrity Issues:: Incisions, Other (Comment) Incisions: R hip Other: ecchymosis, erythema/redness to bilateral arm  Last BM:  02/21/23  Height:   Ht Readings from Last 1 Encounters:  02/18/23 5\' 1"  (1.549 m)    Weight:   Wt Readings from Last 1 Encounters:  02/18/23 42.2 kg    Ideal Body Weight:  50 kg  BMI:  Body mass index is 17.57 kg/m.  Estimated Nutritional Needs:   Kcal:  1500-1750 kcal/day  Protein:  65-75 gm/day  Fluid:  1500-1750 mL/day    Alvino Chapel, RDLD Clinical Dietitian See AMION for contact information

## 2023-02-25 NOTE — Progress Notes (Signed)
Called report to [facility].

## 2023-02-25 NOTE — TOC Transition Note (Signed)
Transition of Care [facility]) - CM/SW Discharge Note   Patient Details  Name: Kendra Lee MRN: [id number] Date of Birth: 12/14/1955  Transition [facility]) CM/SW Contact:  Lorri Frederick, LCSW Phone Number: 02/25/2023, 12:14 PM   Clinical Narrative:   Pt discharging to [facility].  RN call report to [phone].      Final next level of care: Skilled Nursing Facility Barriers to Discharge: Barriers Resolved   Patient Goals and CMS Choice CMS Medicare.gov Compare Post Acute Care list provided to:: Patient Choice offered to / list presented to : Patient  Discharge Placement                Patient chooses bed at: [facility] Patient to be transferred to facility by: ptar Name of family member notified: son Ramon Dredge Patient and family notified of of transfer: 02/25/23  Discharge Plan and Services Additional resources added to the After Visit Summary for   In-house Referral: Clinical Social Work Discharge Planning Services: CM Consult                      HH Arranged: PT, OT HH Agency: [facility] Date [facility] Agency Contacted: 02/18/23 Time HH Agency Contacted: 1226 Representative spoke with at [facility] Agency: Amy  Social Determinants of Health (SDOH) Interventions SDOH Screenings   Food Insecurity: Patient Declined (02/18/2023)  Housing: High Risk (02/18/2023)  Transportation Needs: No Transportation Needs (02/18/2023)  Utilities: Not At Risk (02/18/2023)  Tobacco Use: High Risk (02/18/2023)     Readmission Risk Interventions    10/09/2020   11:28 AM 09/20/2020    2:01 PM  Readmission Risk Prevention Plan  Medication Screening  Complete  Transportation Screening Complete Complete  PCP or Specialist Appt within 5-7 Days Complete   Home Care Screening Complete   Medication Review (RN CM) Complete

## 2023-02-25 NOTE — ED Triage Notes (Signed)
Pt presents via EMS c/o constipation. EMS reports pt sent from facility due to not having BM since right hip surgery at this hospital.   Pt denies abd pain. Does endorse some pain at hip that was surgically repaired.   A&O x4  Reports she is not normally regular with BMs.

## 2023-02-25 NOTE — Discharge Summary (Signed)
Physician Discharge Summary  Krisan Bester Furnari ZOX:[id number] DOB: Oct 17, 1955 DOA: 02/17/2023  PCP: Benetta Spar, MD  Admit date: 02/17/2023 Discharge date: 02/25/2023  Admitted From: Home Disposition:  SNF  Recommendations for Outpatient Follow-up:  Follow up with PCP in 1-2 weeks Please obtain BMP/CBC in one week your next doctors visit.  Pain meds with bowel regimen 2 more days of PO Steroids Stop ASA, cont Plavix.  Stop hydrochlorothiazide and Losartan. On Norvasc (increaed to 10mg  po daily)   Discharge Condition: Stable CODE STATUS: Full Diet recommendation: Low Na  Brief/Interim Summary: Brief Narrative:  67 y.o. female with history of CVA, hypertension, ongoing tobacco abuse comes to the emergency department with acute onset of right hip pain while patient was working on her fireplace bending lost her balance fell on her right side, unable to ambulate, x-ray found hip nondisplaced right femoral fracture.  Patient underwent ORIF on 11/26 and postop had some bleeding around surgical site requiring for Plavix to be held.  During hospitalization also required PRBC transfusion.  Hospital course further complicated by COPD exacerbation requiring prednisone, bronchodilators and azithromycin.  PT/OT is recommending SNF.   Assessment & Plan:  Principal Problem:   Closed right hip fracture [facility]) Active Problems:   Hypokalemia   Hyponatremia   Essential hypertension   Dyslipidemia   Malnutrition of moderate degree    Close right intertrochanter fracture: Status post ORIF/IMN of the right hip on 11/26.  Postop suffered from blood loss requiring PRBC transfusion.  Plavix okay to be resumed upon discharge and outpatient follow-up with orthopedic.  Currently recommending daily wound care as needed, reinforce dressing as needed, unrestricted ROM, weightbearing as tolerated.   Acute blood loss anemia: Baseline hemoglobin was 11, postoperatively dropped out to 6.9 requiring  PRBC transfusion.  Now stable around 9.6.   COPD with exacerbation  improved.  Completed antibiotic course.  On prednisone x 2 more days   Hypokalemia: It was repleted   Hypovolemic hyponatremia: Now resolved.   Essential hypertension: Resume meds as above.    History of CVA: Used to be on aspirin and Plavix.  He was cryptogenic stroke.  High risk of falls.  She is about 2 years out.  Will discontinue aspirin and continue only Plavix upon discharge    Constipation Dulcolax       DVT prophylaxis: plavix per ortho Family Communication: Son Ramon Dredge updated telephonically 12/2 Status is: Inpatient Remains inpatient appropriate because: DC today    Subjective: Doing well no other complaints.    Examination:  General exam: Appears calm and comfortable  Respiratory system: Clear to auscultation. Respiratory effort normal. Cardiovascular system: S1 & S2 heard, RRR. No JVD, murmurs, rubs, gallops or clicks. No pedal edema. Gastrointestinal system: Abdomen is nondistended, soft and nontender. No organomegaly or masses felt. Normal bowel sounds heard. Central nervous system: Alert and oriented. No focal neurological deficits. Extremities: Symmetric 5 x 5 power. Skin: No rashes, lesions or ulcers. Dressing looks ok Psychiatry: Judgement and insight appear normal. Mood & affect appropriate.    Discharge Diagnoses:  Principal Problem:   Closed right hip fracture (HCC) Active Problems:   Hypokalemia   Hyponatremia   Essential hypertension   Dyslipidemia   Malnutrition of moderate degree      Discharge Exam: Vitals:   02/25/23 0849 02/25/23 0952  BP:  (!) 152/61  Pulse:  76  Resp:  18  Temp:  97.8 F (36.6 C)  SpO2: 97% 96%   Vitals:   02/24/23 2025 02/25/23  0040 02/25/23 0849 02/25/23 0952  BP:  (!) 155/70  (!) 152/61  Pulse: 78 63  76  Resp: 16 16  18   Temp:    97.8 F (36.6 C)  TempSrc:    Oral  SpO2: 93% 98% 97% 96%  Weight:      Height:          Discharge Instructions  Discharge Instructions     Call MD for:  redness, tenderness, or signs of infection (pain, swelling, redness, odor or green/yellow discharge around incision site)   Complete by: As directed    Call MD for:  temperature >100.4   Complete by: As directed    Diet - low sodium heart healthy   Complete by: As directed    Diet general   Complete by: As directed    Increase activity slowly   Complete by: As directed       Allergies as of 02/25/2023   No Known Allergies      Medication List     STOP taking these medications    aspirin 81 MG chewable tablet   diphenhydrAMINE 25 mg capsule Commonly known as: BENADRYL   losartan-hydrochlorothiazide 100-12.5 MG tablet Commonly known as: HYZAAR   potassium chloride SA 20 MEQ tablet Commonly known as: KLOR-CON M       TAKE these medications    acetaminophen 650 MG CR tablet Commonly known as: TYLENOL Take 1,300 mg by mouth every 8 (eight) hours as needed for pain. What changed: Another medication with the same name was removed. Continue taking this medication, and follow the directions you see here.   alendronate 70 MG tablet Commonly known as: FOSAMAX Take 70 mg by mouth once a week.   amLODipine 10 MG tablet Commonly known as: NORVASC Take 1 tablet (10 mg total) by mouth daily. What changed:  medication strength how much to take   atorvastatin 80 MG tablet Commonly known as: LIPITOR Take 80 mg by mouth daily.   clopidogrel 75 MG tablet Commonly known as: PLAVIX Take 1 tablet (75 mg total) by mouth daily.   docusate sodium 100 MG capsule Commonly known as: COLACE Take 100 mg by mouth 2 (two) times daily.   HYDROcodone-acetaminophen 5-325 MG tablet Commonly known as: NORCO/VICODIN Take 1 tablet by mouth every 6 (six) hours as needed for up to 3 days for severe pain (pain score 7-10).   ipratropium-albuterol 0.5-2.5 (3) MG/3ML Soln Commonly known as: DUONEB Take 3 mLs by  nebulization every 6 (six) hours.   losartan 100 MG tablet Commonly known as: COZAAR Take 1 tablet (100 mg total) by mouth daily.   mirtazapine 7.5 MG tablet Commonly known as: REMERON Take 7.5 mg by mouth at bedtime.   predniSONE 10 MG tablet Commonly known as: DELTASONE Take 3 tablets (30 mg total) by mouth daily with breakfast for 2 days. Takes 2 tabs for 1 days, then 1 tab for 1 days, and then stop.   thiamine 100 MG tablet Commonly known as: VITAMIN B1 Take 1 tablet (100 mg total) by mouth daily.        Contact information for follow-up providers     Myrene Galas, MD. Schedule an appointment as soon as possible for a visit in 10 day(s).   Specialty: Orthopedic Surgery Contact information: [address] [place] [address] [phone]              Contact information for after-discharge care     Destination  [facility]erred SNF .   Service: Skilled Nursing Contact information: [address] [place] [address] [phone]                    No Known Allergies  You were cared for by a hospitalist during your hospital stay. If you have any questions about your discharge medications or the care you received while you were in the hospital after you are discharged, you can call the unit and asked to speak with the hospitalist on call if the hospitalist that took care of you is not available. Once you are discharged, your primary care physician will handle any further medical issues. Please note that no refills for any discharge medications will be authorized once you are discharged, as it is imperative that you return to your primary care physician (or establish a relationship with a primary care physician if you do not have one) for your aftercare needs so that they can reassess your need for medications and monitor your lab values.  You were cared for by a hospitalist during your  hospital stay. If you have any questions about your discharge medications or the care you received while you were in the hospital after you are discharged, you can call the unit and asked to speak with the hospitalist on call if the hospitalist that took care of you is not available. Once you are discharged, your primary care physician will handle any further medical issues. Please note that NO REFILLS for any discharge medications will be authorized once you are discharged, as it is imperative that you return to your primary care physician (or establish a relationship with a primary care physician if you do not have one) for your aftercare needs so that they can reassess your need for medications and monitor your lab values.  Please request your Prim.MD to go over all Hospital Tests and Procedure/Radiological results at the follow up, please get all Hospital records sent to your Prim MD by signing hospital release before you go home.  Get CBC, CMP, 2 view Chest X ray checked  by Primary MD during your next visit or SNF MD in 5-7 days ( we routinely change or add medications that can affect your baseline labs and fluid status, therefore we recommend that you get the mentioned basic workup next visit with your PCP, your PCP may decide not to get them or add new tests based on their clinical decision)  On your next visit with your primary care physician please Get Medicines reviewed and adjusted.  If you experience worsening of your admission symptoms, develop shortness of breath, life threatening emergency, suicidal or homicidal thoughts you must seek medical attention immediately by calling 911 or calling your MD immediately  if symptoms less severe.  You Must read complete instructions/literature along with all the possible adverse reactions/side effects for all the Medicines you take and that have been prescribed to you. Take any new Medicines after you have completely understood and accpet all the  possible adverse reactions/side effects.   Do not drive, operate heavy machinery, perform activities at heights, swimming or participation in water activities or provide baby sitting services if your were admitted for syncope or siezures until you have seen by Primary MD or a Neurologist and advised to do so again.  Do not drive when taking Pain medications.   Procedures/Studies: DG Swallowing Func-Speech Pathology  Result Date: 02/19/2023 Table formatting from the original result was  not included. Modified Barium Swallow Study Patient Details Name: Mickaila Fina MRN: [id number] Date of Birth: 04/09/1955 Today's Date: 02/19/2023 HPI/PMH: HPI: Patient is a 67 y.o. female with PMH: CVA (2022; with resultant dysphagia requiring PEG and modified diet) HTN, ongoing tobacco abuse. She presented to the hospital on 02/17/2023 with acute onset of right hip pain after she was bending over to work on her fireplace, lost her balance and fell on her right side. She denied LOC. She underwent IM nail of right hip on 02/19/23. Clinical Impression: Clinical Impression: Patient presents with a mild oropharyngeal dysphagia as per this modified barium swallow study. As compared to 2022 MBS images as well as SLP's clinical impression statement at that time, patient's dysphagia does not appear to have signifcantly changed. She continues with decreased lingual ROM and strength, leading to delayed anterior to posterior transit of liquid and solid boluses as well as reduced laryngeal elevation and laryngeal vestibule closure, resulting in penetration of thin liquids above vocal cords (PAS 3) and instances of penetration to the vocal cords (PAS 4, 5) and penetration (PAS 2) with nectar thick liquids. Swallow initiated at level of pyriform sinus with thin and nectar thick liquids but initiated at level of vallecular sinus with puree solids and honey thick liquids. Only trace residuals on lingual surface, palate and base of tongue  visualized s/p initial swallows but with eventual full clearance. No instances of aspiration observed. All consistencies transited through PES without difficulty and without residuals or retrograde flow at or below PES. SLP educated patient as to results and recommended she continue with the swallow safety precautions she has already been using at home. No further skilled SLP warranted at this time. Factors that may increase risk of adverse event in presence of aspiration Rubye Oaks & Clearance Coots 2021): Factors that may increase risk of adverse event in presence of aspiration Rubye Oaks & Clearance Coots 2021): Weak cough; Poor general health and/or compromised immunity Recommendations/Plan: Swallowing Evaluation Recommendations Swallowing Evaluation Recommendations Recommendations: PO diet PO Diet Recommendation: Dysphagia 3 (Mechanical soft); Thin liquids (Level 0) Liquid Administration via: Cup; Straw Medication Administration: Whole meds with puree Supervision: Patient able to self-feed Swallowing strategies  : Slow rate; Small bites/sips Postural changes: Position pt fully upright for meals; Stay upright 30-60 min after meals Oral care recommendations: Oral care BID (2x/day); Pt independent with oral care Treatment Plan Treatment Plan Treatment recommendations: No treatment recommended at this time Follow-up recommendations: No SLP follow up Recommendations Comment: TBD following MBS Functional status assessment: Patient has not had a recent decline in their functional status. Recommendations Recommendations for follow up therapy are one component of a multi-disciplinary discharge planning process, led by the attending physician.  Recommendations may be updated based on patient status, additional functional criteria and insurance authorization. Assessment: Orofacial Exam: Orofacial Exam Oral Cavity: Oral Hygiene: WFL Oral Cavity - Dentition: Poor condition; Missing dentition Orofacial Anatomy: WFL Oral Motor/Sensory Function:  WFL Anatomy: Anatomy: WFL Boluses Administered: Boluses Administered Boluses Administered: Thin liquids (Level 0); Mildly thick liquids (Level 2, nectar thick); Moderately thick liquids (Level 3, honey thick); Puree  Oral Impairment Domain: Oral Impairment Domain Lip Closure: No labial escape Tongue control during bolus hold: Not tested Bolus preparation/mastication: Slow prolonged chewing/mashing with complete recollection Bolus transport/lingual motion: Slow tongue motion Oral residue: Residue collection on oral structures Location of oral residue : Tongue; Palate Initiation of pharyngeal swallow : Pyriform sinuses  Pharyngeal Impairment Domain: Pharyngeal Impairment Domain Soft palate elevation: No bolus between soft palate (SP)/pharyngeal  wall (PW) Laryngeal elevation: Partial superior movement of thyroid cartilage/partial approximation of arytenoids to epiglottic petiole Anterior hyoid excursion: Complete anterior movement Epiglottic movement: Complete inversion Laryngeal vestibule closure: Incomplete, narrow column air/contrast in laryngeal vestibule Pharyngeal stripping wave : Present - complete Pharyngoesophageal segment opening: Complete distension and complete duration, no obstruction of flow Tongue base retraction: Trace column of contrast or air between tongue base and PPW Pharyngeal residue: Complete pharyngeal clearance Location of pharyngeal residue: N/A  Esophageal Impairment Domain: Esophageal Impairment Domain Esophageal clearance upright position: Complete clearance, esophageal coating Pill: No data recorded Penetration/Aspiration Scale Score: Penetration/Aspiration Scale Score 1.  Material does not enter airway: Moderately thick liquids (Level 3, honey thick); Puree 2.  Material enters airway, remains ABOVE vocal cords then ejected out: Mildly thick liquids (Level 2, nectar thick) 3.  Material enters airway, remains ABOVE vocal cords and not ejected out: Thin liquids (Level 0) 4.  Material enters  airway, CONTACTS cords then ejected out: Thin liquids (Level 0) 5.  Material enters airway, CONTACTS cords and not ejected out: Thin liquids (Level 0) Compensatory Strategies: Compensatory Strategies Compensatory strategies: No   General Information: Caregiver present: No  Diet Prior to this Study: Dysphagia 3 (mechanical soft); Thin liquids (Level 0)   Temperature : Normal   Respiratory Status: WFL   Supplemental O2: None (Room air)   History of Recent Intubation: Yes  Behavior/Cognition: Alert; Cooperative; Pleasant mood Self-Feeding Abilities: Able to self-feed Baseline vocal quality/speech: Normal Volitional Cough: Able to elicit Volitional Swallow: Able to elicit Exam Limitations: No limitations Goal Planning: No data recorded No data recorded No data recorded Patient/Family Stated Goal: return home Consulted and agree with results and recommendations: Patient Pain: Pain Assessment Pain Assessment: No/denies pain End of Session: Start Time:SLP Start Time (ACUTE ONLY): 0955 Stop Time: SLP Stop Time (ACUTE ONLY): 1010 Time Calculation:SLP Time Calculation (min) (ACUTE ONLY): 15 min Charges: SLP Evaluations $ SLP Speech Visit: 1 Visit SLP Evaluations $BSS Swallow: 1 Procedure $MBS Swallow: 1 Procedure SLP visit diagnosis: SLP Visit Diagnosis: Dysphagia, oropharyngeal phase (R13.12) Past Medical History: Past Medical History: Diagnosis Date  CVA (cerebral vascular accident) (HCC)   Dysphagia   Hypertension   Hypokalemia   Smoker   2 ppd x 55 years Past Surgical History: Past Surgical History: Procedure Laterality Date  ABDOMINAL HYSTERECTOMY    late 1980s  INTRAMEDULLARY (IM) NAIL INTERTROCHANTERIC Right 02/18/2023  Procedure: INTRAMEDULLARY (IM) NAIL INTERTROCHANTERIC;  Surgeon: Myrene Galas, MD;  Location: [facility] OR;  Service: Orthopedics;  Laterality: Right;  IR GASTROSTOMY TUBE MOD SED  09/22/2020  IR GASTROSTOMY TUBE MOD SED  10/04/2020 Angela Nevin, MA, CCC-SLP Speech Therapy   DG HIP UNILAT W OR W/O  PELVIS 2-3 VIEWS RIGHT  Result Date: 02/18/2023 CLINICAL DATA:  Right femoral intertrochanteric fracture. EXAM: DG HIP (WITH OR WITHOUT PELVIS) 2-3V RIGHT COMPARISON:  Radiograph dated 02/17/2023. FINDINGS: Status post ORIF of right femoral neck fracture. The hardware is intact. The soft tissues are unremarkable. Vascular calcifications noted. IMPRESSION: Status post ORIF of right femoral neck fracture. Electronically Signed   By: Elgie Collard M.D.   On: 02/18/2023 22:52   DG HIP UNILAT WITH PELVIS 2-3 VIEWS RIGHT  Result Date: 02/18/2023 CLINICAL DATA:  Elective surgery EXAM: DG HIP (WITH OR WITHOUT PELVIS) 2-3V RIGHT COMPARISON:  Right hip x-ray 02/17/2023 FINDINGS: Six intraoperative fluoroscopic views of the right hip were submitted. Right hip screw and short femoral nail were placed fixating intratrochanteric fracture. Alignment is anatomic. Fluoroscopy time: 52 seconds.  Fluoroscopy dose: 6.26 micro gray. IMPRESSION: Intraoperative fluoroscopic views of the right hip. Electronically Signed   By: Darliss Cheney M.D.   On: 02/18/2023 22:06   DG C-Arm 1-60 Min-No Report  Result Date: 02/18/2023 Fluoroscopy was utilized by the requesting physician.  No radiographic interpretation.   DG C-Arm 1-60 Min-No Report  Result Date: 02/18/2023 Fluoroscopy was utilized by the requesting physician.  No radiographic interpretation.   DG Pelvis Portable  Result Date: 02/17/2023 CLINICAL DATA:  Check for pelvic fracture after fall EXAM: PORTABLE PELVIS 1-2 VIEWS COMPARISON:  Femur radiographs earlier today FINDINGS: Redemonstrated right intertrochanteric femur fracture. No pelvic fracture. No diastasis. Degenerative changes pubic symphysis, both hips, SI joints and lower lumbar spine. IMPRESSION: Redemonstrated right intertrochanteric fracture. No additional fractures. Electronically Signed   By: Minerva Fester M.D.   On: 02/17/2023 22:15   DG FEMUR, MIN 2 VIEWS RIGHT  Result Date:  02/17/2023 CLINICAL DATA:  Fall EXAM: RIGHT FEMUR 2 VIEWS COMPARISON:  Right hip x-ray 04/16/2005. FINDINGS: The bones are osteopenic. There is a nondisplaced right femoral intratrochanteric fracture. There is no dislocation. Peripheral vascular calcifications are present. IMPRESSION: Nondisplaced right femoral intratrochanteric fracture. Electronically Signed   By: Darliss Cheney M.D.   On: 02/17/2023 21:20     The results of significant diagnostics from this hospitalization (including imaging, microbiology, ancillary and laboratory) are listed below for reference.     Microbiology: Recent Results (from the past 240 hour(s))  Surgical pcr screen     Status: None   Collection Time: 02/18/23  1:01 AM   Specimen: Nasal Mucosa; Nasal Swab  Result Value Ref Range Status   MRSA, PCR NEGATIVE NEGATIVE Final   Staphylococcus aureus NEGATIVE NEGATIVE Final    Comment: (NOTE) The Xpert SA Assay (FDA approved for NASAL specimens in patients 39 years of age and older), is one component of a comprehensive surveillance program. It is not intended to diagnose infection nor to guide or monitor treatment. Performed at [facility], [address], [place], [place] [address]      Labs: BNP (last 3 results) No results for input(s): "BNP" in the last 8760 hours. Basic Metabolic Panel: Recent Labs  Lab 02/19/23 0603 02/20/23 0519 02/21/23 0604 02/21/23 1023 02/22/23 0828  NA 138 134* 138 139 138  K 3.8 3.7 3.8 3.9 5.0  CL 106 105 105 104 103  CO2 21* 25 26 28 29   GLUCOSE 75 122* 98 107* 112*  BUN 8 10 10 9 16   CREATININE 0.90 0.60 0.61 0.56 0.63  CALCIUM 7.8* 8.2* 8.6* 8.9 9.0   Liver Function Tests: No results for input(s): "AST", "ALT", "ALKPHOS", "BILITOT", "PROT", "ALBUMIN" in the last 168 hours. No results for input(s): "LIPASE", "AMYLASE" in the last 168 hours. No results for input(s): "AMMONIA" in the last 168 hours. CBC: Recent Labs  Lab 02/22/23 0828 02/22/23 0952  02/23/23 0852 02/23/23 1718 02/24/23 0732  WBC 9.1 9.9 10.0 11.8* 9.5  HGB 6.9* 7.3* 7.0* 9.3* 9.6*  HCT 21.9* 22.5* 22.4* 27.8* 28.9*  MCV 96.5 95.7 97.8 92.4 94.4  PLT 232 233 264 266 276   Cardiac Enzymes: No results for input(s): "CKTOTAL", "CKMB", "CKMBINDEX", "TROPONINI" in the last 168 hours. BNP: Invalid input(s): "POCBNP" CBG: No results for input(s): "GLUCAP" in the last 168 hours. D-Dimer No results for input(s): "DDIMER" in the last 72 hours. Hgb A1c No results for input(s): "HGBA1C" in the last 72 hours. Lipid Profile No results for input(s): "CHOL", "HDL", "LDLCALC", "  TRIG", "CHOLHDL", "LDLDIRECT" in the last 72 hours. Thyroid function studies No results for input(s): "TSH", "T4TOTAL", "T3FREE", "THYROIDAB" in the last 72 hours.  Invalid input(s): "FREET3" Anemia work up No results for input(s): "VITAMINB12", "FOLATE", "FERRITIN", "TIBC", "IRON", "RETICCTPCT" in the last 72 hours. Urinalysis    Component Value Date/Time   COLORURINE YELLOW 02/17/2023 2210   APPEARANCEUR HAZY (A) 02/17/2023 2210   LABSPEC 1.014 02/17/2023 2210   PHURINE 6.0 02/17/2023 2210   GLUCOSEU NEGATIVE 02/17/2023 2210   HGBUR NEGATIVE 02/17/2023 2210   BILIRUBINUR NEGATIVE 02/17/2023 2210   KETONESUR 20 (A) 02/17/2023 2210   PROTEINUR NEGATIVE 02/17/2023 2210   NITRITE NEGATIVE 02/17/2023 2210   LEUKOCYTESUR NEGATIVE 02/17/2023 2210   Sepsis Labs Recent Labs  Lab 02/22/23 0952 02/23/23 0852 02/23/23 1718 02/24/23 0732  WBC 9.9 10.0 11.8* 9.5   Microbiology Recent Results (from the past 240 hour(s))  Surgical pcr screen     Status: None   Collection Time: 02/18/23  1:01 AM   Specimen: Nasal Mucosa; Nasal Swab  Result Value Ref Range Status   MRSA, PCR NEGATIVE NEGATIVE Final   Staphylococcus aureus NEGATIVE NEGATIVE Final    Comment: (NOTE) The Xpert SA Assay (FDA approved for NASAL specimens in patients 85 years of age and older), is one component of a  comprehensive surveillance program. It is not intended to diagnose infection nor to guide or monitor treatment. Performed at [facility], [address], [place], [place] [address]      Time coordinating discharge:  I have spent 35 minutes face to face with the patient and on the ward discussing the patients care, assessment, plan and disposition with other care givers. >50% of the time was devoted counseling the patient about the risks and benefits of treatment/Discharge disposition and coordinating care.   SIGNED:   Miguel Rota, MD  [facility] 02/25/2023, 11:55 AM   If 7PM-7AM, please contact night-coverage

## 2023-02-26 ENCOUNTER — Emergency Department (HOSPITAL_COMMUNITY): Payer: 59

## 2023-02-26 DIAGNOSIS — I82401 Acute embolism and thrombosis of unspecified deep veins of right lower extremity: Secondary | ICD-10-CM | POA: Diagnosis not present

## 2023-02-26 DIAGNOSIS — I10 Essential (primary) hypertension: Secondary | ICD-10-CM | POA: Diagnosis not present

## 2023-02-26 DIAGNOSIS — K5641 Fecal impaction: Secondary | ICD-10-CM | POA: Diagnosis not present

## 2023-02-26 DIAGNOSIS — D649 Anemia, unspecified: Secondary | ICD-10-CM | POA: Diagnosis not present

## 2023-02-26 DIAGNOSIS — F17200 Nicotine dependence, unspecified, uncomplicated: Secondary | ICD-10-CM | POA: Diagnosis not present

## 2023-02-26 DIAGNOSIS — J441 Chronic obstructive pulmonary disease with (acute) exacerbation: Secondary | ICD-10-CM | POA: Diagnosis not present

## 2023-02-26 DIAGNOSIS — S72001A Fracture of unspecified part of neck of right femur, initial encounter for closed fracture: Secondary | ICD-10-CM | POA: Diagnosis not present

## 2023-02-26 DIAGNOSIS — I82491 Acute embolism and thrombosis of other specified deep vein of right lower extremity: Secondary | ICD-10-CM | POA: Diagnosis not present

## 2023-02-26 DIAGNOSIS — I69351 Hemiplegia and hemiparesis following cerebral infarction affecting right dominant side: Secondary | ICD-10-CM | POA: Diagnosis not present

## 2023-02-26 DIAGNOSIS — R0902 Hypoxemia: Secondary | ICD-10-CM | POA: Diagnosis not present

## 2023-02-26 DIAGNOSIS — S72141D Displaced intertrochanteric fracture of right femur, subsequent encounter for closed fracture with routine healing: Secondary | ICD-10-CM | POA: Diagnosis not present

## 2023-02-26 DIAGNOSIS — Z8673 Personal history of transient ischemic attack (TIA), and cerebral infarction without residual deficits: Secondary | ICD-10-CM | POA: Diagnosis not present

## 2023-02-26 DIAGNOSIS — K5900 Constipation, unspecified: Secondary | ICD-10-CM | POA: Diagnosis not present

## 2023-02-26 DIAGNOSIS — Z7902 Long term (current) use of antithrombotics/antiplatelets: Secondary | ICD-10-CM | POA: Diagnosis not present

## 2023-02-26 DIAGNOSIS — Z7401 Bed confinement status: Secondary | ICD-10-CM | POA: Diagnosis not present

## 2023-02-26 DIAGNOSIS — J449 Chronic obstructive pulmonary disease, unspecified: Secondary | ICD-10-CM | POA: Diagnosis not present

## 2023-02-26 DIAGNOSIS — E440 Moderate protein-calorie malnutrition: Secondary | ICD-10-CM | POA: Diagnosis not present

## 2023-02-26 DIAGNOSIS — M6281 Muscle weakness (generalized): Secondary | ICD-10-CM | POA: Diagnosis not present

## 2023-02-26 DIAGNOSIS — R278 Other lack of coordination: Secondary | ICD-10-CM | POA: Diagnosis not present

## 2023-02-26 DIAGNOSIS — S7291XD Unspecified fracture of right femur, subsequent encounter for closed fracture with routine healing: Secondary | ICD-10-CM | POA: Diagnosis not present

## 2023-02-26 DIAGNOSIS — Z79899 Other long term (current) drug therapy: Secondary | ICD-10-CM | POA: Diagnosis not present

## 2023-02-26 DIAGNOSIS — Z7952 Long term (current) use of systemic steroids: Secondary | ICD-10-CM | POA: Diagnosis not present

## 2023-02-26 MED ORDER — IOHEXOL 350 MG/ML SOLN
65.0000 mL | Freq: Once | INTRAVENOUS | Status: AC | PRN
  Administered 2023-02-26: 65 mL via INTRAVENOUS

## 2023-02-26 MED ORDER — POLYETHYLENE GLYCOL 3350 17 G PO PACK: 17.0000 g | PACK | Freq: Every day | ORAL | 0 refills | Status: DC

## 2023-02-26 MED ORDER — SMOG ENEMA
960.0000 mL | Freq: Once | RECTAL | Status: AC
  Administered 2023-02-26: 960 mL via RECTAL
  Filled 2023-02-26: qty 960

## 2023-02-26 MED ORDER — HYDROCODONE-ACETAMINOPHEN 5-325 MG PO TABS
1.0000 | ORAL_TABLET | Freq: Once | ORAL | Status: AC
  Administered 2023-02-26: 1 via ORAL
  Filled 2023-02-26: qty 1

## 2023-02-26 NOTE — ED Notes (Signed)
Report called by this RN to [facility] at [facility].

## 2023-02-26 NOTE — ED Provider Notes (Signed)
Accepted handoff at shift change from [facility], [place]. Please see prior provider note for more detail.   Briefly: Patient is 67 y.o. presenting for constipation status post recent admission for hip fracture with subsequent ORIF.  DDX: concern for bowel obstruction, constipation, bowel perforation, other.  Plan: Reassess after SMOG enema   Physical Exam  BP (!) 156/69 (BP Location: Right Arm)   Pulse 64   Temp 98.4 F (36.9 C) (Oral)   Resp 16   LMP  (LMP Unknown)   SpO2 96%   Physical Exam  Procedures  Procedures  ED Course / MDM   Clinical Course as of 02/26/23 0851  Wed Feb 26, 2023  4782 Constipation. SMOG enema. Talk to hospitalist. If SMOG enema not successful, can be readmitted to hospital. Reassess after enema.  [JR]    Clinical Course User Index [JR] Gareth Eagle, PA-C   Medical Decision Making Amount and/or Complexity of Data Reviewed Labs: ordered. Radiology: ordered.  Risk OTC drugs. Prescription drug management.   On reassessment, patient abdomen remains soft and nontender.  She did have a small bowel movement after smog enema.  Advised her to continue taking Colace and added MiraLAX to her bowel regiment.  Discussed pertinent return precautions.  Vitals stable throughout encounter.  Discharged in good condition.       Gareth Eagle, PA-C 02/26/23 9562    Cathren Laine, MD 02/26/23 [phone]

## 2023-02-26 NOTE — ED Notes (Signed)
Pt given enema with small results. Several balls of stool resulted from enema.

## 2023-02-26 NOTE — ED Notes (Signed)
Patient transported to CT 

## 2023-02-26 NOTE — ED Notes (Signed)
Ptar called on the way to pick up

## 2023-02-26 NOTE — Discharge Instructions (Signed)
You were seen tonight for constipation.  Please continue to take your Colace.  I also recommend taking MiraLAX to keep your bowels moving.  If you develop any life-threatening symptoms return to the emergency department.

## 2023-02-26 NOTE — ED Notes (Signed)
PTAR at bedside 

## 2023-02-28 DIAGNOSIS — Z8673 Personal history of transient ischemic attack (TIA), and cerebral infarction without residual deficits: Secondary | ICD-10-CM | POA: Diagnosis not present

## 2023-02-28 DIAGNOSIS — I82401 Acute embolism and thrombosis of unspecified deep veins of right lower extremity: Secondary | ICD-10-CM | POA: Diagnosis not present

## 2023-02-28 DIAGNOSIS — K5900 Constipation, unspecified: Secondary | ICD-10-CM | POA: Diagnosis not present

## 2023-02-28 DIAGNOSIS — S7291XD Unspecified fracture of right femur, subsequent encounter for closed fracture with routine healing: Secondary | ICD-10-CM | POA: Diagnosis not present

## 2023-02-28 DIAGNOSIS — D649 Anemia, unspecified: Secondary | ICD-10-CM | POA: Diagnosis not present

## 2023-02-28 DIAGNOSIS — Z7902 Long term (current) use of antithrombotics/antiplatelets: Secondary | ICD-10-CM | POA: Diagnosis not present

## 2023-02-28 DIAGNOSIS — R278 Other lack of coordination: Secondary | ICD-10-CM | POA: Diagnosis not present

## 2023-02-28 DIAGNOSIS — J441 Chronic obstructive pulmonary disease with (acute) exacerbation: Secondary | ICD-10-CM | POA: Diagnosis not present

## 2023-02-28 DIAGNOSIS — M6281 Muscle weakness (generalized): Secondary | ICD-10-CM | POA: Diagnosis not present

## 2023-02-28 DIAGNOSIS — Z7952 Long term (current) use of systemic steroids: Secondary | ICD-10-CM | POA: Diagnosis not present

## 2023-02-28 DIAGNOSIS — I69351 Hemiplegia and hemiparesis following cerebral infarction affecting right dominant side: Secondary | ICD-10-CM | POA: Diagnosis not present

## 2023-02-28 DIAGNOSIS — J449 Chronic obstructive pulmonary disease, unspecified: Secondary | ICD-10-CM | POA: Diagnosis not present

## 2023-02-28 DIAGNOSIS — S72001A Fracture of unspecified part of neck of right femur, initial encounter for closed fracture: Secondary | ICD-10-CM | POA: Diagnosis not present

## 2023-02-28 DIAGNOSIS — I10 Essential (primary) hypertension: Secondary | ICD-10-CM | POA: Diagnosis not present

## 2023-02-28 DIAGNOSIS — I82491 Acute embolism and thrombosis of other specified deep vein of right lower extremity: Secondary | ICD-10-CM | POA: Diagnosis not present

## 2023-02-28 DIAGNOSIS — E440 Moderate protein-calorie malnutrition: Secondary | ICD-10-CM | POA: Diagnosis not present

## 2023-03-03 DIAGNOSIS — K5900 Constipation, unspecified: Secondary | ICD-10-CM | POA: Diagnosis not present

## 2023-03-03 DIAGNOSIS — Z8673 Personal history of transient ischemic attack (TIA), and cerebral infarction without residual deficits: Secondary | ICD-10-CM | POA: Diagnosis not present

## 2023-03-03 DIAGNOSIS — Z7902 Long term (current) use of antithrombotics/antiplatelets: Secondary | ICD-10-CM | POA: Diagnosis not present

## 2023-03-03 DIAGNOSIS — D649 Anemia, unspecified: Secondary | ICD-10-CM | POA: Diagnosis not present

## 2023-03-03 DIAGNOSIS — Z7952 Long term (current) use of systemic steroids: Secondary | ICD-10-CM | POA: Diagnosis not present

## 2023-03-03 DIAGNOSIS — I82401 Acute embolism and thrombosis of unspecified deep veins of right lower extremity: Secondary | ICD-10-CM | POA: Diagnosis not present

## 2023-03-03 DIAGNOSIS — I10 Essential (primary) hypertension: Secondary | ICD-10-CM | POA: Diagnosis not present

## 2023-03-03 DIAGNOSIS — J441 Chronic obstructive pulmonary disease with (acute) exacerbation: Secondary | ICD-10-CM | POA: Diagnosis not present

## 2023-03-03 DIAGNOSIS — S72001A Fracture of unspecified part of neck of right femur, initial encounter for closed fracture: Secondary | ICD-10-CM | POA: Diagnosis not present

## 2023-03-04 DIAGNOSIS — K5900 Constipation, unspecified: Secondary | ICD-10-CM | POA: Diagnosis not present

## 2023-03-04 DIAGNOSIS — I10 Essential (primary) hypertension: Secondary | ICD-10-CM | POA: Diagnosis not present

## 2023-03-04 DIAGNOSIS — S72001A Fracture of unspecified part of neck of right femur, initial encounter for closed fracture: Secondary | ICD-10-CM | POA: Diagnosis not present

## 2023-03-04 DIAGNOSIS — I69351 Hemiplegia and hemiparesis following cerebral infarction affecting right dominant side: Secondary | ICD-10-CM | POA: Diagnosis not present

## 2023-03-04 DIAGNOSIS — I82401 Acute embolism and thrombosis of unspecified deep veins of right lower extremity: Secondary | ICD-10-CM | POA: Diagnosis not present

## 2023-03-04 DIAGNOSIS — D649 Anemia, unspecified: Secondary | ICD-10-CM | POA: Diagnosis not present

## 2023-03-04 DIAGNOSIS — R278 Other lack of coordination: Secondary | ICD-10-CM | POA: Diagnosis not present

## 2023-03-04 DIAGNOSIS — I82491 Acute embolism and thrombosis of other specified deep vein of right lower extremity: Secondary | ICD-10-CM | POA: Diagnosis not present

## 2023-03-04 DIAGNOSIS — M6281 Muscle weakness (generalized): Secondary | ICD-10-CM | POA: Diagnosis not present

## 2023-03-04 DIAGNOSIS — Z8673 Personal history of transient ischemic attack (TIA), and cerebral infarction without residual deficits: Secondary | ICD-10-CM | POA: Diagnosis not present

## 2023-03-04 DIAGNOSIS — S7291XD Unspecified fracture of right femur, subsequent encounter for closed fracture with routine healing: Secondary | ICD-10-CM | POA: Diagnosis not present

## 2023-03-04 DIAGNOSIS — J449 Chronic obstructive pulmonary disease, unspecified: Secondary | ICD-10-CM | POA: Diagnosis not present

## 2023-03-04 DIAGNOSIS — Z7952 Long term (current) use of systemic steroids: Secondary | ICD-10-CM | POA: Diagnosis not present

## 2023-03-04 DIAGNOSIS — E440 Moderate protein-calorie malnutrition: Secondary | ICD-10-CM | POA: Diagnosis not present

## 2023-03-04 DIAGNOSIS — Z7902 Long term (current) use of antithrombotics/antiplatelets: Secondary | ICD-10-CM | POA: Diagnosis not present

## 2023-03-04 DIAGNOSIS — J441 Chronic obstructive pulmonary disease with (acute) exacerbation: Secondary | ICD-10-CM | POA: Diagnosis not present

## 2023-03-05 DIAGNOSIS — D649 Anemia, unspecified: Secondary | ICD-10-CM | POA: Diagnosis not present

## 2023-03-05 DIAGNOSIS — I82401 Acute embolism and thrombosis of unspecified deep veins of right lower extremity: Secondary | ICD-10-CM | POA: Diagnosis not present

## 2023-03-05 DIAGNOSIS — Z7952 Long term (current) use of systemic steroids: Secondary | ICD-10-CM | POA: Diagnosis not present

## 2023-03-05 DIAGNOSIS — Z7902 Long term (current) use of antithrombotics/antiplatelets: Secondary | ICD-10-CM | POA: Diagnosis not present

## 2023-03-05 DIAGNOSIS — I10 Essential (primary) hypertension: Secondary | ICD-10-CM | POA: Diagnosis not present

## 2023-03-05 DIAGNOSIS — K5900 Constipation, unspecified: Secondary | ICD-10-CM | POA: Diagnosis not present

## 2023-03-05 DIAGNOSIS — J441 Chronic obstructive pulmonary disease with (acute) exacerbation: Secondary | ICD-10-CM | POA: Diagnosis not present

## 2023-03-05 DIAGNOSIS — S72001A Fracture of unspecified part of neck of right femur, initial encounter for closed fracture: Secondary | ICD-10-CM | POA: Diagnosis not present

## 2023-03-05 DIAGNOSIS — Z8673 Personal history of transient ischemic attack (TIA), and cerebral infarction without residual deficits: Secondary | ICD-10-CM | POA: Diagnosis not present

## 2023-03-07 DIAGNOSIS — K5900 Constipation, unspecified: Secondary | ICD-10-CM | POA: Diagnosis not present

## 2023-03-07 DIAGNOSIS — M6281 Muscle weakness (generalized): Secondary | ICD-10-CM | POA: Diagnosis not present

## 2023-03-07 DIAGNOSIS — J449 Chronic obstructive pulmonary disease, unspecified: Secondary | ICD-10-CM | POA: Diagnosis not present

## 2023-03-07 DIAGNOSIS — S7291XD Unspecified fracture of right femur, subsequent encounter for closed fracture with routine healing: Secondary | ICD-10-CM | POA: Diagnosis not present

## 2023-03-07 DIAGNOSIS — I69351 Hemiplegia and hemiparesis following cerebral infarction affecting right dominant side: Secondary | ICD-10-CM | POA: Diagnosis not present

## 2023-03-07 DIAGNOSIS — S72001A Fracture of unspecified part of neck of right femur, initial encounter for closed fracture: Secondary | ICD-10-CM | POA: Diagnosis not present

## 2023-03-07 DIAGNOSIS — Z7902 Long term (current) use of antithrombotics/antiplatelets: Secondary | ICD-10-CM | POA: Diagnosis not present

## 2023-03-07 DIAGNOSIS — E440 Moderate protein-calorie malnutrition: Secondary | ICD-10-CM | POA: Diagnosis not present

## 2023-03-07 DIAGNOSIS — D649 Anemia, unspecified: Secondary | ICD-10-CM | POA: Diagnosis not present

## 2023-03-07 DIAGNOSIS — I10 Essential (primary) hypertension: Secondary | ICD-10-CM | POA: Diagnosis not present

## 2023-03-07 DIAGNOSIS — Z8673 Personal history of transient ischemic attack (TIA), and cerebral infarction without residual deficits: Secondary | ICD-10-CM | POA: Diagnosis not present

## 2023-03-07 DIAGNOSIS — I82401 Acute embolism and thrombosis of unspecified deep veins of right lower extremity: Secondary | ICD-10-CM | POA: Diagnosis not present

## 2023-03-07 DIAGNOSIS — I82491 Acute embolism and thrombosis of other specified deep vein of right lower extremity: Secondary | ICD-10-CM | POA: Diagnosis not present

## 2023-03-07 DIAGNOSIS — R278 Other lack of coordination: Secondary | ICD-10-CM | POA: Diagnosis not present

## 2023-03-07 DIAGNOSIS — J441 Chronic obstructive pulmonary disease with (acute) exacerbation: Secondary | ICD-10-CM | POA: Diagnosis not present

## 2023-03-07 DIAGNOSIS — Z7952 Long term (current) use of systemic steroids: Secondary | ICD-10-CM | POA: Diagnosis not present

## 2023-03-10 DIAGNOSIS — D649 Anemia, unspecified: Secondary | ICD-10-CM | POA: Diagnosis not present

## 2023-03-10 DIAGNOSIS — K5900 Constipation, unspecified: Secondary | ICD-10-CM | POA: Diagnosis not present

## 2023-03-10 DIAGNOSIS — I82401 Acute embolism and thrombosis of unspecified deep veins of right lower extremity: Secondary | ICD-10-CM | POA: Diagnosis not present

## 2023-03-10 DIAGNOSIS — Z8673 Personal history of transient ischemic attack (TIA), and cerebral infarction without residual deficits: Secondary | ICD-10-CM | POA: Diagnosis not present

## 2023-03-10 DIAGNOSIS — J441 Chronic obstructive pulmonary disease with (acute) exacerbation: Secondary | ICD-10-CM | POA: Diagnosis not present

## 2023-03-10 DIAGNOSIS — Z7952 Long term (current) use of systemic steroids: Secondary | ICD-10-CM | POA: Diagnosis not present

## 2023-03-10 DIAGNOSIS — Z7902 Long term (current) use of antithrombotics/antiplatelets: Secondary | ICD-10-CM | POA: Diagnosis not present

## 2023-03-10 DIAGNOSIS — S72001A Fracture of unspecified part of neck of right femur, initial encounter for closed fracture: Secondary | ICD-10-CM | POA: Diagnosis not present

## 2023-03-10 DIAGNOSIS — I10 Essential (primary) hypertension: Secondary | ICD-10-CM | POA: Diagnosis not present

## 2023-03-11 DIAGNOSIS — M6281 Muscle weakness (generalized): Secondary | ICD-10-CM | POA: Diagnosis not present

## 2023-03-11 DIAGNOSIS — I82491 Acute embolism and thrombosis of other specified deep vein of right lower extremity: Secondary | ICD-10-CM | POA: Diagnosis not present

## 2023-03-11 DIAGNOSIS — S7291XD Unspecified fracture of right femur, subsequent encounter for closed fracture with routine healing: Secondary | ICD-10-CM | POA: Diagnosis not present

## 2023-03-11 DIAGNOSIS — R278 Other lack of coordination: Secondary | ICD-10-CM | POA: Diagnosis not present

## 2023-03-11 DIAGNOSIS — J449 Chronic obstructive pulmonary disease, unspecified: Secondary | ICD-10-CM | POA: Diagnosis not present

## 2023-03-11 DIAGNOSIS — I69351 Hemiplegia and hemiparesis following cerebral infarction affecting right dominant side: Secondary | ICD-10-CM | POA: Diagnosis not present

## 2023-03-11 DIAGNOSIS — E440 Moderate protein-calorie malnutrition: Secondary | ICD-10-CM | POA: Diagnosis not present

## 2023-03-12 DIAGNOSIS — S72141D Displaced intertrochanteric fracture of right femur, subsequent encounter for closed fracture with routine healing: Secondary | ICD-10-CM | POA: Diagnosis not present

## 2023-03-14 DIAGNOSIS — Z7952 Long term (current) use of systemic steroids: Secondary | ICD-10-CM | POA: Diagnosis not present

## 2023-03-14 DIAGNOSIS — R278 Other lack of coordination: Secondary | ICD-10-CM | POA: Diagnosis not present

## 2023-03-14 DIAGNOSIS — I10 Essential (primary) hypertension: Secondary | ICD-10-CM | POA: Diagnosis not present

## 2023-03-14 DIAGNOSIS — D649 Anemia, unspecified: Secondary | ICD-10-CM | POA: Diagnosis not present

## 2023-03-14 DIAGNOSIS — Z8673 Personal history of transient ischemic attack (TIA), and cerebral infarction without residual deficits: Secondary | ICD-10-CM | POA: Diagnosis not present

## 2023-03-14 DIAGNOSIS — S72001A Fracture of unspecified part of neck of right femur, initial encounter for closed fracture: Secondary | ICD-10-CM | POA: Diagnosis not present

## 2023-03-14 DIAGNOSIS — E440 Moderate protein-calorie malnutrition: Secondary | ICD-10-CM | POA: Diagnosis not present

## 2023-03-14 DIAGNOSIS — S7291XD Unspecified fracture of right femur, subsequent encounter for closed fracture with routine healing: Secondary | ICD-10-CM | POA: Diagnosis not present

## 2023-03-14 DIAGNOSIS — Z7902 Long term (current) use of antithrombotics/antiplatelets: Secondary | ICD-10-CM | POA: Diagnosis not present

## 2023-03-14 DIAGNOSIS — I82401 Acute embolism and thrombosis of unspecified deep veins of right lower extremity: Secondary | ICD-10-CM | POA: Diagnosis not present

## 2023-03-14 DIAGNOSIS — I69351 Hemiplegia and hemiparesis following cerebral infarction affecting right dominant side: Secondary | ICD-10-CM | POA: Diagnosis not present

## 2023-03-14 DIAGNOSIS — K5900 Constipation, unspecified: Secondary | ICD-10-CM | POA: Diagnosis not present

## 2023-03-14 DIAGNOSIS — J441 Chronic obstructive pulmonary disease with (acute) exacerbation: Secondary | ICD-10-CM | POA: Diagnosis not present

## 2023-03-14 DIAGNOSIS — J449 Chronic obstructive pulmonary disease, unspecified: Secondary | ICD-10-CM | POA: Diagnosis not present

## 2023-03-14 DIAGNOSIS — I82491 Acute embolism and thrombosis of other specified deep vein of right lower extremity: Secondary | ICD-10-CM | POA: Diagnosis not present

## 2023-03-14 DIAGNOSIS — M6281 Muscle weakness (generalized): Secondary | ICD-10-CM | POA: Diagnosis not present

## 2023-03-17 DIAGNOSIS — R262 Difficulty in walking, not elsewhere classified: Secondary | ICD-10-CM | POA: Diagnosis not present

## 2023-03-17 DIAGNOSIS — I82451 Acute embolism and thrombosis of right peroneal vein: Secondary | ICD-10-CM | POA: Diagnosis not present

## 2023-03-17 DIAGNOSIS — D62 Acute posthemorrhagic anemia: Secondary | ICD-10-CM | POA: Diagnosis not present

## 2023-03-17 DIAGNOSIS — Z7982 Long term (current) use of aspirin: Secondary | ICD-10-CM | POA: Diagnosis not present

## 2023-03-17 DIAGNOSIS — M6281 Muscle weakness (generalized): Secondary | ICD-10-CM | POA: Diagnosis not present

## 2023-03-17 DIAGNOSIS — S72144D Nondisplaced intertrochanteric fracture of right femur, subsequent encounter for closed fracture with routine healing: Secondary | ICD-10-CM | POA: Diagnosis not present

## 2023-03-17 DIAGNOSIS — Z9181 History of falling: Secondary | ICD-10-CM | POA: Diagnosis not present

## 2023-03-17 DIAGNOSIS — Z7902 Long term (current) use of antithrombotics/antiplatelets: Secondary | ICD-10-CM | POA: Diagnosis not present

## 2023-03-17 DIAGNOSIS — K5900 Constipation, unspecified: Secondary | ICD-10-CM | POA: Diagnosis not present

## 2023-03-17 DIAGNOSIS — F17200 Nicotine dependence, unspecified, uncomplicated: Secondary | ICD-10-CM | POA: Diagnosis not present

## 2023-03-17 DIAGNOSIS — Z8673 Personal history of transient ischemic attack (TIA), and cerebral infarction without residual deficits: Secondary | ICD-10-CM | POA: Diagnosis not present

## 2023-03-17 DIAGNOSIS — J441 Chronic obstructive pulmonary disease with (acute) exacerbation: Secondary | ICD-10-CM | POA: Diagnosis not present

## 2023-03-17 DIAGNOSIS — I10 Essential (primary) hypertension: Secondary | ICD-10-CM | POA: Diagnosis not present

## 2023-03-18 DIAGNOSIS — M6281 Muscle weakness (generalized): Secondary | ICD-10-CM | POA: Diagnosis not present

## 2023-03-18 DIAGNOSIS — F17200 Nicotine dependence, unspecified, uncomplicated: Secondary | ICD-10-CM | POA: Diagnosis not present

## 2023-03-18 DIAGNOSIS — Z9181 History of falling: Secondary | ICD-10-CM | POA: Diagnosis not present

## 2023-03-18 DIAGNOSIS — K5900 Constipation, unspecified: Secondary | ICD-10-CM | POA: Diagnosis not present

## 2023-03-18 DIAGNOSIS — S72144D Nondisplaced intertrochanteric fracture of right femur, subsequent encounter for closed fracture with routine healing: Secondary | ICD-10-CM | POA: Diagnosis not present

## 2023-03-18 DIAGNOSIS — I10 Essential (primary) hypertension: Secondary | ICD-10-CM | POA: Diagnosis not present

## 2023-03-18 DIAGNOSIS — I82451 Acute embolism and thrombosis of right peroneal vein: Secondary | ICD-10-CM | POA: Diagnosis not present

## 2023-03-18 DIAGNOSIS — Z8673 Personal history of transient ischemic attack (TIA), and cerebral infarction without residual deficits: Secondary | ICD-10-CM | POA: Diagnosis not present

## 2023-03-18 DIAGNOSIS — Z7982 Long term (current) use of aspirin: Secondary | ICD-10-CM | POA: Diagnosis not present

## 2023-03-18 DIAGNOSIS — D62 Acute posthemorrhagic anemia: Secondary | ICD-10-CM | POA: Diagnosis not present

## 2023-03-18 DIAGNOSIS — J441 Chronic obstructive pulmonary disease with (acute) exacerbation: Secondary | ICD-10-CM | POA: Diagnosis not present

## 2023-03-18 DIAGNOSIS — R262 Difficulty in walking, not elsewhere classified: Secondary | ICD-10-CM | POA: Diagnosis not present

## 2023-03-18 DIAGNOSIS — Z7902 Long term (current) use of antithrombotics/antiplatelets: Secondary | ICD-10-CM | POA: Diagnosis not present

## 2023-03-25 DIAGNOSIS — F17200 Nicotine dependence, unspecified, uncomplicated: Secondary | ICD-10-CM | POA: Diagnosis not present

## 2023-03-25 DIAGNOSIS — M6281 Muscle weakness (generalized): Secondary | ICD-10-CM | POA: Diagnosis not present

## 2023-03-25 DIAGNOSIS — D62 Acute posthemorrhagic anemia: Secondary | ICD-10-CM | POA: Diagnosis not present

## 2023-03-25 DIAGNOSIS — R262 Difficulty in walking, not elsewhere classified: Secondary | ICD-10-CM | POA: Diagnosis not present

## 2023-03-25 DIAGNOSIS — Z7902 Long term (current) use of antithrombotics/antiplatelets: Secondary | ICD-10-CM | POA: Diagnosis not present

## 2023-03-25 DIAGNOSIS — K5900 Constipation, unspecified: Secondary | ICD-10-CM | POA: Diagnosis not present

## 2023-03-25 DIAGNOSIS — Z9181 History of falling: Secondary | ICD-10-CM | POA: Diagnosis not present

## 2023-03-25 DIAGNOSIS — I10 Essential (primary) hypertension: Secondary | ICD-10-CM | POA: Diagnosis not present

## 2023-03-25 DIAGNOSIS — Z7982 Long term (current) use of aspirin: Secondary | ICD-10-CM | POA: Diagnosis not present

## 2023-03-25 DIAGNOSIS — S72144D Nondisplaced intertrochanteric fracture of right femur, subsequent encounter for closed fracture with routine healing: Secondary | ICD-10-CM | POA: Diagnosis not present

## 2023-03-25 DIAGNOSIS — Z8673 Personal history of transient ischemic attack (TIA), and cerebral infarction without residual deficits: Secondary | ICD-10-CM | POA: Diagnosis not present

## 2023-03-25 DIAGNOSIS — I82451 Acute embolism and thrombosis of right peroneal vein: Secondary | ICD-10-CM | POA: Diagnosis not present

## 2023-03-25 DIAGNOSIS — J441 Chronic obstructive pulmonary disease with (acute) exacerbation: Secondary | ICD-10-CM | POA: Diagnosis not present

## 2023-03-27 DIAGNOSIS — E43 Unspecified severe protein-calorie malnutrition: Secondary | ICD-10-CM | POA: Diagnosis not present

## 2023-03-27 DIAGNOSIS — M810 Age-related osteoporosis without current pathological fracture: Secondary | ICD-10-CM | POA: Diagnosis not present

## 2023-03-27 DIAGNOSIS — S72001D Fracture of unspecified part of neck of right femur, subsequent encounter for closed fracture with routine healing: Secondary | ICD-10-CM | POA: Diagnosis not present

## 2023-03-27 DIAGNOSIS — J42 Unspecified chronic bronchitis: Secondary | ICD-10-CM | POA: Diagnosis not present

## 2023-03-28 DIAGNOSIS — L89152 Pressure ulcer of sacral region, stage 2: Secondary | ICD-10-CM | POA: Diagnosis not present

## 2023-03-28 DIAGNOSIS — I480 Paroxysmal atrial fibrillation: Secondary | ICD-10-CM | POA: Diagnosis not present

## 2023-03-28 DIAGNOSIS — M80051D Age-related osteoporosis with current pathological fracture, right femur, subsequent encounter for fracture with routine healing: Secondary | ICD-10-CM | POA: Diagnosis not present

## 2023-03-28 DIAGNOSIS — M1000 Idiopathic gout, unspecified site: Secondary | ICD-10-CM | POA: Diagnosis not present

## 2023-03-28 DIAGNOSIS — I110 Hypertensive heart disease with heart failure: Secondary | ICD-10-CM | POA: Diagnosis not present

## 2023-03-28 DIAGNOSIS — I5022 Chronic systolic (congestive) heart failure: Secondary | ICD-10-CM | POA: Diagnosis not present

## 2023-04-01 DIAGNOSIS — M1000 Idiopathic gout, unspecified site: Secondary | ICD-10-CM | POA: Diagnosis not present

## 2023-04-01 DIAGNOSIS — I480 Paroxysmal atrial fibrillation: Secondary | ICD-10-CM | POA: Diagnosis not present

## 2023-04-01 DIAGNOSIS — I110 Hypertensive heart disease with heart failure: Secondary | ICD-10-CM | POA: Diagnosis not present

## 2023-04-01 DIAGNOSIS — L89152 Pressure ulcer of sacral region, stage 2: Secondary | ICD-10-CM | POA: Diagnosis not present

## 2023-04-01 DIAGNOSIS — I5022 Chronic systolic (congestive) heart failure: Secondary | ICD-10-CM | POA: Diagnosis not present

## 2023-04-01 DIAGNOSIS — M80051D Age-related osteoporosis with current pathological fracture, right femur, subsequent encounter for fracture with routine healing: Secondary | ICD-10-CM | POA: Diagnosis not present

## 2023-04-02 ENCOUNTER — Telehealth: Payer: Self-pay | Admitting: *Deleted

## 2023-04-02 NOTE — Patient Outreach (Signed)
  Care Coordination   Documentation Note   04/02/2023 Name: Chalsey Leeth MRN: [id number] DOB: 04/07/1955  Quatisha Zylka Zirbes is a 68 y.o. year old female who sees Fanta, Benita Area, MD for primary care. I reviewed patient's chart due to ED visit on 02/25/23 to see if Care Management services may be beneficial. Patient was discharged from [facility] on 02/25/23 to SNF for rehab after being admitted for a hip fracture. She was brought into the ED on on 02/25/23 due to constipation and admitting facility would not take her until she had a bowel movement. Patient was discharged from ED and admitted to [facility] in [place].   What matters to the patients health and wellness today?  Did not speak with patient today.   SDOH assessments and interventions completed:  No   Care Coordination Interventions:  No, not indicated   Follow up plan:  Will monitor [facility] for discharge from SNF and will reach out to patient once discharged.     Encounter Outcome:  Patient Visit Completed   Josette Pellet, RN, BSN Care Manager [facility]  [facility]  Population Health  Direct Dial: [phone] Main #: [phone]

## 2023-04-03 DIAGNOSIS — I5022 Chronic systolic (congestive) heart failure: Secondary | ICD-10-CM | POA: Diagnosis not present

## 2023-04-03 DIAGNOSIS — L89152 Pressure ulcer of sacral region, stage 2: Secondary | ICD-10-CM | POA: Diagnosis not present

## 2023-04-03 DIAGNOSIS — I480 Paroxysmal atrial fibrillation: Secondary | ICD-10-CM | POA: Diagnosis not present

## 2023-04-03 DIAGNOSIS — M1000 Idiopathic gout, unspecified site: Secondary | ICD-10-CM | POA: Diagnosis not present

## 2023-04-03 DIAGNOSIS — M80051D Age-related osteoporosis with current pathological fracture, right femur, subsequent encounter for fracture with routine healing: Secondary | ICD-10-CM | POA: Diagnosis not present

## 2023-04-03 DIAGNOSIS — I110 Hypertensive heart disease with heart failure: Secondary | ICD-10-CM | POA: Diagnosis not present

## 2023-04-07 DIAGNOSIS — M80051D Age-related osteoporosis with current pathological fracture, right femur, subsequent encounter for fracture with routine healing: Secondary | ICD-10-CM | POA: Diagnosis not present

## 2023-04-07 DIAGNOSIS — L89152 Pressure ulcer of sacral region, stage 2: Secondary | ICD-10-CM | POA: Diagnosis not present

## 2023-04-07 DIAGNOSIS — I480 Paroxysmal atrial fibrillation: Secondary | ICD-10-CM | POA: Diagnosis not present

## 2023-04-07 DIAGNOSIS — I5022 Chronic systolic (congestive) heart failure: Secondary | ICD-10-CM | POA: Diagnosis not present

## 2023-04-07 DIAGNOSIS — M1000 Idiopathic gout, unspecified site: Secondary | ICD-10-CM | POA: Diagnosis not present

## 2023-04-07 DIAGNOSIS — I110 Hypertensive heart disease with heart failure: Secondary | ICD-10-CM | POA: Diagnosis not present

## 2023-04-08 DIAGNOSIS — L89152 Pressure ulcer of sacral region, stage 2: Secondary | ICD-10-CM | POA: Diagnosis not present

## 2023-04-08 DIAGNOSIS — M80051D Age-related osteoporosis with current pathological fracture, right femur, subsequent encounter for fracture with routine healing: Secondary | ICD-10-CM | POA: Diagnosis not present

## 2023-04-08 DIAGNOSIS — I110 Hypertensive heart disease with heart failure: Secondary | ICD-10-CM | POA: Diagnosis not present

## 2023-04-08 DIAGNOSIS — M1000 Idiopathic gout, unspecified site: Secondary | ICD-10-CM | POA: Diagnosis not present

## 2023-04-08 DIAGNOSIS — I5022 Chronic systolic (congestive) heart failure: Secondary | ICD-10-CM | POA: Diagnosis not present

## 2023-04-08 DIAGNOSIS — I480 Paroxysmal atrial fibrillation: Secondary | ICD-10-CM | POA: Diagnosis not present

## 2023-04-10 DIAGNOSIS — L89152 Pressure ulcer of sacral region, stage 2: Secondary | ICD-10-CM | POA: Diagnosis not present

## 2023-04-10 DIAGNOSIS — M1000 Idiopathic gout, unspecified site: Secondary | ICD-10-CM | POA: Diagnosis not present

## 2023-04-10 DIAGNOSIS — I110 Hypertensive heart disease with heart failure: Secondary | ICD-10-CM | POA: Diagnosis not present

## 2023-04-10 DIAGNOSIS — I5022 Chronic systolic (congestive) heart failure: Secondary | ICD-10-CM | POA: Diagnosis not present

## 2023-04-10 DIAGNOSIS — I480 Paroxysmal atrial fibrillation: Secondary | ICD-10-CM | POA: Diagnosis not present

## 2023-04-10 DIAGNOSIS — M80051D Age-related osteoporosis with current pathological fracture, right femur, subsequent encounter for fracture with routine healing: Secondary | ICD-10-CM | POA: Diagnosis not present

## 2023-04-14 DIAGNOSIS — I480 Paroxysmal atrial fibrillation: Secondary | ICD-10-CM | POA: Diagnosis not present

## 2023-04-14 DIAGNOSIS — M1000 Idiopathic gout, unspecified site: Secondary | ICD-10-CM | POA: Diagnosis not present

## 2023-04-14 DIAGNOSIS — I5022 Chronic systolic (congestive) heart failure: Secondary | ICD-10-CM | POA: Diagnosis not present

## 2023-04-14 DIAGNOSIS — I110 Hypertensive heart disease with heart failure: Secondary | ICD-10-CM | POA: Diagnosis not present

## 2023-04-14 DIAGNOSIS — M80051D Age-related osteoporosis with current pathological fracture, right femur, subsequent encounter for fracture with routine healing: Secondary | ICD-10-CM | POA: Diagnosis not present

## 2023-04-14 DIAGNOSIS — L89152 Pressure ulcer of sacral region, stage 2: Secondary | ICD-10-CM | POA: Diagnosis not present

## 2023-04-16 ENCOUNTER — Other Ambulatory Visit (HOSPITAL_COMMUNITY): Payer: Self-pay | Admitting: Gerontology

## 2023-04-16 ENCOUNTER — Other Ambulatory Visit (HOSPITAL_COMMUNITY): Payer: Self-pay | Admitting: Internal Medicine

## 2023-04-16 DIAGNOSIS — S72001D Fracture of unspecified part of neck of right femur, subsequent encounter for closed fracture with routine healing: Secondary | ICD-10-CM | POA: Diagnosis not present

## 2023-04-16 DIAGNOSIS — Z13820 Encounter for screening for osteoporosis: Secondary | ICD-10-CM

## 2023-04-16 DIAGNOSIS — Z0001 Encounter for general adult medical examination with abnormal findings: Secondary | ICD-10-CM | POA: Diagnosis not present

## 2023-04-16 DIAGNOSIS — I5022 Chronic systolic (congestive) heart failure: Secondary | ICD-10-CM | POA: Diagnosis not present

## 2023-04-16 DIAGNOSIS — I10 Essential (primary) hypertension: Secondary | ICD-10-CM | POA: Diagnosis not present

## 2023-04-16 DIAGNOSIS — Z1389 Encounter for screening for other disorder: Secondary | ICD-10-CM | POA: Diagnosis not present

## 2023-04-16 DIAGNOSIS — E43 Unspecified severe protein-calorie malnutrition: Secondary | ICD-10-CM | POA: Diagnosis not present

## 2023-04-16 DIAGNOSIS — M810 Age-related osteoporosis without current pathological fracture: Secondary | ICD-10-CM | POA: Diagnosis not present

## 2023-04-16 DIAGNOSIS — Z1231 Encounter for screening mammogram for malignant neoplasm of breast: Secondary | ICD-10-CM

## 2023-04-17 DIAGNOSIS — L89152 Pressure ulcer of sacral region, stage 2: Secondary | ICD-10-CM | POA: Diagnosis not present

## 2023-04-17 DIAGNOSIS — M1000 Idiopathic gout, unspecified site: Secondary | ICD-10-CM | POA: Diagnosis not present

## 2023-04-17 DIAGNOSIS — M80051D Age-related osteoporosis with current pathological fracture, right femur, subsequent encounter for fracture with routine healing: Secondary | ICD-10-CM | POA: Diagnosis not present

## 2023-04-17 DIAGNOSIS — I480 Paroxysmal atrial fibrillation: Secondary | ICD-10-CM | POA: Diagnosis not present

## 2023-04-17 DIAGNOSIS — I5022 Chronic systolic (congestive) heart failure: Secondary | ICD-10-CM | POA: Diagnosis not present

## 2023-04-17 DIAGNOSIS — I110 Hypertensive heart disease with heart failure: Secondary | ICD-10-CM | POA: Diagnosis not present

## 2023-04-21 DIAGNOSIS — Z0001 Encounter for general adult medical examination with abnormal findings: Secondary | ICD-10-CM | POA: Diagnosis not present

## 2023-04-21 DIAGNOSIS — M80051D Age-related osteoporosis with current pathological fracture, right femur, subsequent encounter for fracture with routine healing: Secondary | ICD-10-CM | POA: Diagnosis not present

## 2023-04-21 DIAGNOSIS — L89152 Pressure ulcer of sacral region, stage 2: Secondary | ICD-10-CM | POA: Diagnosis not present

## 2023-04-21 DIAGNOSIS — Z1159 Encounter for screening for other viral diseases: Secondary | ICD-10-CM | POA: Diagnosis not present

## 2023-04-21 DIAGNOSIS — M1000 Idiopathic gout, unspecified site: Secondary | ICD-10-CM | POA: Diagnosis not present

## 2023-04-21 DIAGNOSIS — I10 Essential (primary) hypertension: Secondary | ICD-10-CM | POA: Diagnosis not present

## 2023-04-21 DIAGNOSIS — I480 Paroxysmal atrial fibrillation: Secondary | ICD-10-CM | POA: Diagnosis not present

## 2023-04-21 DIAGNOSIS — I110 Hypertensive heart disease with heart failure: Secondary | ICD-10-CM | POA: Diagnosis not present

## 2023-04-21 DIAGNOSIS — E43 Unspecified severe protein-calorie malnutrition: Secondary | ICD-10-CM | POA: Diagnosis not present

## 2023-04-21 DIAGNOSIS — I5022 Chronic systolic (congestive) heart failure: Secondary | ICD-10-CM | POA: Diagnosis not present

## 2023-04-23 DIAGNOSIS — M80051D Age-related osteoporosis with current pathological fracture, right femur, subsequent encounter for fracture with routine healing: Secondary | ICD-10-CM | POA: Diagnosis not present

## 2023-04-23 DIAGNOSIS — I110 Hypertensive heart disease with heart failure: Secondary | ICD-10-CM | POA: Diagnosis not present

## 2023-04-23 DIAGNOSIS — I5022 Chronic systolic (congestive) heart failure: Secondary | ICD-10-CM | POA: Diagnosis not present

## 2023-04-23 DIAGNOSIS — L89152 Pressure ulcer of sacral region, stage 2: Secondary | ICD-10-CM | POA: Diagnosis not present

## 2023-04-23 DIAGNOSIS — I480 Paroxysmal atrial fibrillation: Secondary | ICD-10-CM | POA: Diagnosis not present

## 2023-04-23 DIAGNOSIS — M1000 Idiopathic gout, unspecified site: Secondary | ICD-10-CM | POA: Diagnosis not present

## 2023-04-24 DIAGNOSIS — I110 Hypertensive heart disease with heart failure: Secondary | ICD-10-CM | POA: Diagnosis not present

## 2023-04-24 DIAGNOSIS — M80051D Age-related osteoporosis with current pathological fracture, right femur, subsequent encounter for fracture with routine healing: Secondary | ICD-10-CM | POA: Diagnosis not present

## 2023-04-24 DIAGNOSIS — M1000 Idiopathic gout, unspecified site: Secondary | ICD-10-CM | POA: Diagnosis not present

## 2023-04-24 DIAGNOSIS — I5022 Chronic systolic (congestive) heart failure: Secondary | ICD-10-CM | POA: Diagnosis not present

## 2023-04-24 DIAGNOSIS — L89152 Pressure ulcer of sacral region, stage 2: Secondary | ICD-10-CM | POA: Diagnosis not present

## 2023-04-24 DIAGNOSIS — I480 Paroxysmal atrial fibrillation: Secondary | ICD-10-CM | POA: Diagnosis not present

## 2023-04-28 DIAGNOSIS — I5022 Chronic systolic (congestive) heart failure: Secondary | ICD-10-CM | POA: Diagnosis not present

## 2023-04-28 DIAGNOSIS — M80051D Age-related osteoporosis with current pathological fracture, right femur, subsequent encounter for fracture with routine healing: Secondary | ICD-10-CM | POA: Diagnosis not present

## 2023-04-28 DIAGNOSIS — L89152 Pressure ulcer of sacral region, stage 2: Secondary | ICD-10-CM | POA: Diagnosis not present

## 2023-04-28 DIAGNOSIS — I110 Hypertensive heart disease with heart failure: Secondary | ICD-10-CM | POA: Diagnosis not present

## 2023-04-28 DIAGNOSIS — M1000 Idiopathic gout, unspecified site: Secondary | ICD-10-CM | POA: Diagnosis not present

## 2023-04-28 DIAGNOSIS — I480 Paroxysmal atrial fibrillation: Secondary | ICD-10-CM | POA: Diagnosis not present

## 2023-05-01 DIAGNOSIS — I480 Paroxysmal atrial fibrillation: Secondary | ICD-10-CM | POA: Diagnosis not present

## 2023-05-01 DIAGNOSIS — L89152 Pressure ulcer of sacral region, stage 2: Secondary | ICD-10-CM | POA: Diagnosis not present

## 2023-05-01 DIAGNOSIS — I5022 Chronic systolic (congestive) heart failure: Secondary | ICD-10-CM | POA: Diagnosis not present

## 2023-05-01 DIAGNOSIS — M1000 Idiopathic gout, unspecified site: Secondary | ICD-10-CM | POA: Diagnosis not present

## 2023-05-01 DIAGNOSIS — I110 Hypertensive heart disease with heart failure: Secondary | ICD-10-CM | POA: Diagnosis not present

## 2023-05-01 DIAGNOSIS — M80051D Age-related osteoporosis with current pathological fracture, right femur, subsequent encounter for fracture with routine healing: Secondary | ICD-10-CM | POA: Diagnosis not present

## 2023-05-06 DIAGNOSIS — L89152 Pressure ulcer of sacral region, stage 2: Secondary | ICD-10-CM | POA: Diagnosis not present

## 2023-05-06 DIAGNOSIS — M80051D Age-related osteoporosis with current pathological fracture, right femur, subsequent encounter for fracture with routine healing: Secondary | ICD-10-CM | POA: Diagnosis not present

## 2023-05-06 DIAGNOSIS — I5022 Chronic systolic (congestive) heart failure: Secondary | ICD-10-CM | POA: Diagnosis not present

## 2023-05-06 DIAGNOSIS — I110 Hypertensive heart disease with heart failure: Secondary | ICD-10-CM | POA: Diagnosis not present

## 2023-05-06 DIAGNOSIS — M1000 Idiopathic gout, unspecified site: Secondary | ICD-10-CM | POA: Diagnosis not present

## 2023-05-06 DIAGNOSIS — I480 Paroxysmal atrial fibrillation: Secondary | ICD-10-CM | POA: Diagnosis not present

## 2023-05-07 ENCOUNTER — Telehealth: Payer: Self-pay | Admitting: *Deleted

## 2023-05-07 DIAGNOSIS — I5022 Chronic systolic (congestive) heart failure: Secondary | ICD-10-CM | POA: Diagnosis not present

## 2023-05-07 DIAGNOSIS — M80051D Age-related osteoporosis with current pathological fracture, right femur, subsequent encounter for fracture with routine healing: Secondary | ICD-10-CM | POA: Diagnosis not present

## 2023-05-07 DIAGNOSIS — I110 Hypertensive heart disease with heart failure: Secondary | ICD-10-CM | POA: Diagnosis not present

## 2023-05-07 DIAGNOSIS — M1000 Idiopathic gout, unspecified site: Secondary | ICD-10-CM | POA: Diagnosis not present

## 2023-05-07 DIAGNOSIS — L89152 Pressure ulcer of sacral region, stage 2: Secondary | ICD-10-CM | POA: Diagnosis not present

## 2023-05-07 DIAGNOSIS — I480 Paroxysmal atrial fibrillation: Secondary | ICD-10-CM | POA: Diagnosis not present

## 2023-05-07 NOTE — Progress Notes (Signed)
Complex Care Management Care Guide Note  05/07/2023 Name: Kendra Lee MRN: [id number] DOB: [birth year]  Kendra Lee is a 68 y.o. year old female who is a primary care patient of Felecia Shelling, Wayland Salinas, MD and is actively engaged with the care management team. I reached out to Andreas Blower by phone today to assist with scheduling  with the RN Case Manager.  Follow up plan: Telephone appointment with complex care management team member scheduled for:  2/13  Kendra Lee  [facility]  [facility], [facility] Guide  Direct Dial: [phone]  Fax [phone]

## 2023-05-08 ENCOUNTER — Other Ambulatory Visit (HOSPITAL_COMMUNITY): Payer: 59

## 2023-05-08 ENCOUNTER — Ambulatory Visit (HOSPITAL_COMMUNITY): Payer: 59

## 2023-05-08 ENCOUNTER — Ambulatory Visit: Payer: 59 | Admitting: *Deleted

## 2023-05-08 ENCOUNTER — Encounter: Payer: Self-pay | Admitting: *Deleted

## 2023-05-08 DIAGNOSIS — I110 Hypertensive heart disease with heart failure: Secondary | ICD-10-CM | POA: Diagnosis not present

## 2023-05-08 DIAGNOSIS — L89152 Pressure ulcer of sacral region, stage 2: Secondary | ICD-10-CM | POA: Diagnosis not present

## 2023-05-08 DIAGNOSIS — I5022 Chronic systolic (congestive) heart failure: Secondary | ICD-10-CM | POA: Diagnosis not present

## 2023-05-08 DIAGNOSIS — M80051D Age-related osteoporosis with current pathological fracture, right femur, subsequent encounter for fracture with routine healing: Secondary | ICD-10-CM | POA: Diagnosis not present

## 2023-05-08 DIAGNOSIS — I480 Paroxysmal atrial fibrillation: Secondary | ICD-10-CM | POA: Diagnosis not present

## 2023-05-08 DIAGNOSIS — M1000 Idiopathic gout, unspecified site: Secondary | ICD-10-CM | POA: Diagnosis not present

## 2023-05-08 NOTE — Patient Outreach (Signed)
  Care Coordination   Initial Visit Note   05/08/2023 Name: Kendra Lee MRN: [id number] DOB: Dec 15, 1955  Kendra Lee is a 68 y.o. year old female who sees Fanta, Wayland Salinas, MD for primary care. I  spoke with Arther Dames at [facility] by telephone today.  What matters to the patients health and wellness today?  No questions or concerns. I did not speak directly with patient.    SDOH assessments and interventions completed:  Yes  SDOH Interventions Today    Flowsheet Row Most Recent Value  SDOH Interventions   Food Insecurity Interventions Intervention Not Indicated  [provided by [facility]]  Housing Interventions Intervention Not Indicated  [Living at [facility]]        Care Coordination Interventions:  No, not indicated. Patient is living at [facility] long-term. She was discharged to SNF on 02/25/23 after hospitalization for right hip fracture. The plan is for long-term care. They are able to provide her transportation, manage medications, and assist with ADLs.   Follow up plan: No further intervention required.   Encounter Outcome:  Patient Visit Completed   Demetrios Loll, RN, BSN [place]  [facility], [facility] RN Care Manager Direct Dial: [phone]

## 2023-05-12 ENCOUNTER — Encounter (HOSPITAL_COMMUNITY): Payer: Self-pay

## 2023-05-12 ENCOUNTER — Ambulatory Visit (HOSPITAL_COMMUNITY)
Admission: RE | Admit: 2023-05-12 | Discharge: 2023-05-12 | Disposition: A | Discharge status: Home / Self Care | Payer: 59 | Source: Ambulatory Visit | Attending: Internal Medicine | Admitting: Internal Medicine

## 2023-05-12 DIAGNOSIS — Z1231 Encounter for screening mammogram for malignant neoplasm of breast: Secondary | ICD-10-CM | POA: Insufficient documentation

## 2023-05-12 DIAGNOSIS — M6281 Muscle weakness (generalized): Secondary | ICD-10-CM | POA: Diagnosis not present

## 2023-05-14 DIAGNOSIS — M6281 Muscle weakness (generalized): Secondary | ICD-10-CM | POA: Diagnosis not present

## 2023-05-17 DIAGNOSIS — I5022 Chronic systolic (congestive) heart failure: Secondary | ICD-10-CM | POA: Diagnosis not present

## 2023-05-17 DIAGNOSIS — I10 Essential (primary) hypertension: Secondary | ICD-10-CM | POA: Diagnosis not present

## 2023-05-20 DIAGNOSIS — M6281 Muscle weakness (generalized): Secondary | ICD-10-CM | POA: Diagnosis not present

## 2023-05-21 DIAGNOSIS — M6281 Muscle weakness (generalized): Secondary | ICD-10-CM | POA: Diagnosis not present

## 2023-05-22 DIAGNOSIS — M6281 Muscle weakness (generalized): Secondary | ICD-10-CM | POA: Diagnosis not present

## 2023-05-27 DIAGNOSIS — M6281 Muscle weakness (generalized): Secondary | ICD-10-CM | POA: Diagnosis not present

## 2023-05-28 DIAGNOSIS — M6281 Muscle weakness (generalized): Secondary | ICD-10-CM | POA: Diagnosis not present

## 2023-05-29 DIAGNOSIS — M6281 Muscle weakness (generalized): Secondary | ICD-10-CM | POA: Diagnosis not present

## 2023-06-03 DIAGNOSIS — M6281 Muscle weakness (generalized): Secondary | ICD-10-CM | POA: Diagnosis not present

## 2023-06-04 DIAGNOSIS — M6281 Muscle weakness (generalized): Secondary | ICD-10-CM | POA: Diagnosis not present

## 2023-06-05 DIAGNOSIS — M6281 Muscle weakness (generalized): Secondary | ICD-10-CM | POA: Diagnosis not present

## 2023-06-06 DIAGNOSIS — M6281 Muscle weakness (generalized): Secondary | ICD-10-CM | POA: Diagnosis not present

## 2023-06-09 DIAGNOSIS — M6281 Muscle weakness (generalized): Secondary | ICD-10-CM | POA: Diagnosis not present

## 2023-06-10 DIAGNOSIS — M6281 Muscle weakness (generalized): Secondary | ICD-10-CM | POA: Diagnosis not present

## 2023-06-12 DIAGNOSIS — M6281 Muscle weakness (generalized): Secondary | ICD-10-CM | POA: Diagnosis not present

## 2023-06-13 DIAGNOSIS — M6281 Muscle weakness (generalized): Secondary | ICD-10-CM | POA: Diagnosis not present

## 2023-06-14 DIAGNOSIS — I5022 Chronic systolic (congestive) heart failure: Secondary | ICD-10-CM | POA: Diagnosis not present

## 2023-06-14 DIAGNOSIS — I10 Essential (primary) hypertension: Secondary | ICD-10-CM | POA: Diagnosis not present

## 2023-06-16 DIAGNOSIS — M6281 Muscle weakness (generalized): Secondary | ICD-10-CM | POA: Diagnosis not present

## 2023-06-17 DIAGNOSIS — M6281 Muscle weakness (generalized): Secondary | ICD-10-CM | POA: Diagnosis not present

## 2023-06-18 DIAGNOSIS — M6281 Muscle weakness (generalized): Secondary | ICD-10-CM | POA: Diagnosis not present

## 2023-06-19 DIAGNOSIS — I10 Essential (primary) hypertension: Secondary | ICD-10-CM | POA: Diagnosis not present

## 2023-06-20 DIAGNOSIS — M6281 Muscle weakness (generalized): Secondary | ICD-10-CM | POA: Diagnosis not present

## 2023-06-23 DIAGNOSIS — M6281 Muscle weakness (generalized): Secondary | ICD-10-CM | POA: Diagnosis not present

## 2023-06-24 DIAGNOSIS — M6281 Muscle weakness (generalized): Secondary | ICD-10-CM | POA: Diagnosis not present

## 2023-06-25 DIAGNOSIS — M6281 Muscle weakness (generalized): Secondary | ICD-10-CM | POA: Diagnosis not present

## 2023-06-30 DIAGNOSIS — M6281 Muscle weakness (generalized): Secondary | ICD-10-CM | POA: Diagnosis not present

## 2023-07-02 DIAGNOSIS — M6281 Muscle weakness (generalized): Secondary | ICD-10-CM | POA: Diagnosis not present

## 2023-07-04 DIAGNOSIS — M6281 Muscle weakness (generalized): Secondary | ICD-10-CM | POA: Diagnosis not present

## 2023-07-09 DIAGNOSIS — M6281 Muscle weakness (generalized): Secondary | ICD-10-CM | POA: Diagnosis not present

## 2023-07-11 DIAGNOSIS — M6281 Muscle weakness (generalized): Secondary | ICD-10-CM | POA: Diagnosis not present

## 2023-07-15 DIAGNOSIS — I5022 Chronic systolic (congestive) heart failure: Secondary | ICD-10-CM | POA: Diagnosis not present

## 2023-07-15 DIAGNOSIS — M6281 Muscle weakness (generalized): Secondary | ICD-10-CM | POA: Diagnosis not present

## 2023-07-15 DIAGNOSIS — I10 Essential (primary) hypertension: Secondary | ICD-10-CM | POA: Diagnosis not present

## 2023-07-17 DIAGNOSIS — M6281 Muscle weakness (generalized): Secondary | ICD-10-CM | POA: Diagnosis not present

## 2023-07-18 DIAGNOSIS — M6281 Muscle weakness (generalized): Secondary | ICD-10-CM | POA: Diagnosis not present

## 2023-07-22 DIAGNOSIS — M6281 Muscle weakness (generalized): Secondary | ICD-10-CM | POA: Diagnosis not present

## 2023-07-24 DIAGNOSIS — M6281 Muscle weakness (generalized): Secondary | ICD-10-CM | POA: Diagnosis not present

## 2023-07-25 DIAGNOSIS — M6281 Muscle weakness (generalized): Secondary | ICD-10-CM | POA: Diagnosis not present

## 2023-07-29 DIAGNOSIS — M6281 Muscle weakness (generalized): Secondary | ICD-10-CM | POA: Diagnosis not present

## 2023-07-31 DIAGNOSIS — M6281 Muscle weakness (generalized): Secondary | ICD-10-CM | POA: Diagnosis not present

## 2023-08-01 DIAGNOSIS — M6281 Muscle weakness (generalized): Secondary | ICD-10-CM | POA: Diagnosis not present

## 2023-08-05 DIAGNOSIS — M6281 Muscle weakness (generalized): Secondary | ICD-10-CM | POA: Diagnosis not present

## 2023-08-06 DIAGNOSIS — M6281 Muscle weakness (generalized): Secondary | ICD-10-CM | POA: Diagnosis not present

## 2023-08-07 DIAGNOSIS — M6281 Muscle weakness (generalized): Secondary | ICD-10-CM | POA: Diagnosis not present

## 2023-08-12 DIAGNOSIS — M6281 Muscle weakness (generalized): Secondary | ICD-10-CM | POA: Diagnosis not present

## 2023-08-13 DIAGNOSIS — M6281 Muscle weakness (generalized): Secondary | ICD-10-CM | POA: Diagnosis not present

## 2023-08-14 DIAGNOSIS — I5022 Chronic systolic (congestive) heart failure: Secondary | ICD-10-CM | POA: Diagnosis not present

## 2023-08-14 DIAGNOSIS — I10 Essential (primary) hypertension: Secondary | ICD-10-CM | POA: Diagnosis not present

## 2023-08-14 DIAGNOSIS — M6281 Muscle weakness (generalized): Secondary | ICD-10-CM | POA: Diagnosis not present

## 2023-08-15 DIAGNOSIS — M6281 Muscle weakness (generalized): Secondary | ICD-10-CM | POA: Diagnosis not present

## 2023-08-19 DIAGNOSIS — M6281 Muscle weakness (generalized): Secondary | ICD-10-CM | POA: Diagnosis not present

## 2023-08-20 DIAGNOSIS — M6281 Muscle weakness (generalized): Secondary | ICD-10-CM | POA: Diagnosis not present

## 2023-08-21 DIAGNOSIS — M6281 Muscle weakness (generalized): Secondary | ICD-10-CM | POA: Diagnosis not present

## 2023-08-26 DIAGNOSIS — M6281 Muscle weakness (generalized): Secondary | ICD-10-CM | POA: Diagnosis not present

## 2023-08-27 DIAGNOSIS — M6281 Muscle weakness (generalized): Secondary | ICD-10-CM | POA: Diagnosis not present

## 2023-08-28 DIAGNOSIS — M6281 Muscle weakness (generalized): Secondary | ICD-10-CM | POA: Diagnosis not present

## 2023-08-31 DIAGNOSIS — M6281 Muscle weakness (generalized): Secondary | ICD-10-CM | POA: Diagnosis not present

## 2023-09-01 DIAGNOSIS — M6281 Muscle weakness (generalized): Secondary | ICD-10-CM | POA: Diagnosis not present

## 2023-09-03 DIAGNOSIS — J029 Acute pharyngitis, unspecified: Secondary | ICD-10-CM | POA: Diagnosis not present

## 2023-09-03 DIAGNOSIS — I10 Essential (primary) hypertension: Secondary | ICD-10-CM | POA: Diagnosis not present

## 2023-09-03 DIAGNOSIS — J42 Unspecified chronic bronchitis: Secondary | ICD-10-CM | POA: Diagnosis not present

## 2023-09-04 DIAGNOSIS — M6281 Muscle weakness (generalized): Secondary | ICD-10-CM | POA: Diagnosis not present

## 2023-09-05 DIAGNOSIS — M6281 Muscle weakness (generalized): Secondary | ICD-10-CM | POA: Diagnosis not present

## 2023-09-08 DIAGNOSIS — M6281 Muscle weakness (generalized): Secondary | ICD-10-CM | POA: Diagnosis not present

## 2023-09-09 DIAGNOSIS — M6281 Muscle weakness (generalized): Secondary | ICD-10-CM | POA: Diagnosis not present

## 2023-09-10 ENCOUNTER — Other Ambulatory Visit: Payer: Self-pay

## 2023-09-10 ENCOUNTER — Observation Stay (HOSPITAL_COMMUNITY): Admission: EM | Admit: 2023-09-10 | Discharge: 2023-09-13 | Attending: Internal Medicine | Admitting: Internal Medicine

## 2023-09-10 ENCOUNTER — Emergency Department (HOSPITAL_COMMUNITY)

## 2023-09-10 ENCOUNTER — Encounter (HOSPITAL_COMMUNITY): Payer: Self-pay

## 2023-09-10 DIAGNOSIS — E876 Hypokalemia: Secondary | ICD-10-CM | POA: Diagnosis not present

## 2023-09-10 DIAGNOSIS — Z7902 Long term (current) use of antithrombotics/antiplatelets: Secondary | ICD-10-CM | POA: Diagnosis not present

## 2023-09-10 DIAGNOSIS — R112 Nausea with vomiting, unspecified: Principal | ICD-10-CM | POA: Diagnosis present

## 2023-09-10 DIAGNOSIS — F32A Depression, unspecified: Secondary | ICD-10-CM

## 2023-09-10 DIAGNOSIS — F17210 Nicotine dependence, cigarettes, uncomplicated: Secondary | ICD-10-CM | POA: Diagnosis not present

## 2023-09-10 DIAGNOSIS — I713 Abdominal aortic aneurysm, ruptured, unspecified: Secondary | ICD-10-CM | POA: Insufficient documentation

## 2023-09-10 DIAGNOSIS — G8929 Other chronic pain: Secondary | ICD-10-CM | POA: Diagnosis not present

## 2023-09-10 DIAGNOSIS — I10 Essential (primary) hypertension: Secondary | ICD-10-CM | POA: Diagnosis present

## 2023-09-10 DIAGNOSIS — Z79899 Other long term (current) drug therapy: Secondary | ICD-10-CM | POA: Diagnosis not present

## 2023-09-10 DIAGNOSIS — Z7982 Long term (current) use of aspirin: Secondary | ICD-10-CM | POA: Insufficient documentation

## 2023-09-10 DIAGNOSIS — R638 Other symptoms and signs concerning food and fluid intake: Secondary | ICD-10-CM | POA: Insufficient documentation

## 2023-09-10 DIAGNOSIS — R1111 Vomiting without nausea: Secondary | ICD-10-CM | POA: Diagnosis not present

## 2023-09-10 DIAGNOSIS — Z8673 Personal history of transient ischemic attack (TIA), and cerebral infarction without residual deficits: Secondary | ICD-10-CM | POA: Diagnosis not present

## 2023-09-10 DIAGNOSIS — R0602 Shortness of breath: Secondary | ICD-10-CM | POA: Insufficient documentation

## 2023-09-10 DIAGNOSIS — G4489 Other headache syndrome: Secondary | ICD-10-CM | POA: Diagnosis not present

## 2023-09-10 DIAGNOSIS — R6889 Other general symptoms and signs: Secondary | ICD-10-CM | POA: Diagnosis not present

## 2023-09-10 DIAGNOSIS — R0902 Hypoxemia: Secondary | ICD-10-CM | POA: Diagnosis not present

## 2023-09-10 DIAGNOSIS — F0390 Unspecified dementia without behavioral disturbance: Secondary | ICD-10-CM | POA: Diagnosis not present

## 2023-09-10 DIAGNOSIS — N1339 Other hydronephrosis: Secondary | ICD-10-CM | POA: Insufficient documentation

## 2023-09-10 DIAGNOSIS — J449 Chronic obstructive pulmonary disease, unspecified: Secondary | ICD-10-CM | POA: Insufficient documentation

## 2023-09-10 DIAGNOSIS — E785 Hyperlipidemia, unspecified: Secondary | ICD-10-CM | POA: Diagnosis present

## 2023-09-10 DIAGNOSIS — K5730 Diverticulosis of large intestine without perforation or abscess without bleeding: Secondary | ICD-10-CM | POA: Diagnosis not present

## 2023-09-10 HISTORY — DX: Unspecified dementia, unspecified severity, without behavioral disturbance, psychotic disturbance, mood disturbance, and anxiety: F03.90

## 2023-09-10 LAB — URINALYSIS, ROUTINE W REFLEX MICROSCOPIC
Bacteria, UA: NONE SEEN
Bilirubin Urine: NEGATIVE
Glucose, UA: NEGATIVE mg/dL
Hgb urine dipstick: NEGATIVE
Ketones, ur: NEGATIVE mg/dL
Leukocytes,Ua: NEGATIVE
Nitrite: NEGATIVE
Protein, ur: 30 mg/dL — AB
Specific Gravity, Urine: 1.015 (ref 1.005–1.030)
pH: 8 (ref 5.0–8.0)

## 2023-09-10 LAB — COMPREHENSIVE METABOLIC PANEL WITH GFR
ALT: 20 U/L (ref 0–44)
AST: 27 U/L (ref 15–41)
Albumin: 4.1 g/dL (ref 3.5–5.0)
Alkaline Phosphatase: 64 U/L (ref 38–126)
Anion gap: 14 (ref 5–15)
BUN: 18 mg/dL (ref 8–23)
CO2: 28 mmol/L (ref 22–32)
Calcium: 10.1 mg/dL (ref 8.9–10.3)
Chloride: 93 mmol/L — ABNORMAL LOW (ref 98–111)
Creatinine, Ser: 0.7 mg/dL (ref 0.44–1.00)
GFR, Estimated: >60 mL/min (ref >60)
Glucose, Bld: 170 mg/dL — ABNORMAL HIGH (ref 70–99)
Potassium: 3.1 mmol/L — ABNORMAL LOW (ref 3.5–5.1)
Sodium: 135 mmol/L (ref 135–145)
Total Bilirubin: 0.4 mg/dL (ref 0.0–1.2)
Total Protein: 7.6 g/dL (ref 6.5–8.1)

## 2023-09-10 LAB — CBC
HCT: 40.5 % (ref 36.0–46.0)
Hemoglobin: 13.9 g/dL (ref 12.0–15.0)
MCH: 30.1 pg (ref 26.0–34.0)
MCHC: 34.3 g/dL (ref 30.0–36.0)
MCV: 87.7 fL (ref 80.0–100.0)
Platelets: 338 10*3/uL (ref 150–400)
RBC: 4.62 MIL/uL (ref 3.87–5.11)
RDW: 14.2 % (ref 11.5–15.5)
WBC: 18.1 10*3/uL — ABNORMAL HIGH (ref 4.0–10.5)
nRBC: 0 % (ref 0.0–0.2)

## 2023-09-10 LAB — LIPASE, BLOOD: Lipase: 31 U/L (ref 11–51)

## 2023-09-10 MED ORDER — IOHEXOL 300 MG/ML  SOLN
100.0000 mL | Freq: Once | INTRAMUSCULAR | Status: AC | PRN
Start: 2023-09-10 — End: 2023-09-10
  Administered 2023-09-10: 100 mL via INTRAVENOUS

## 2023-09-10 MED ORDER — ONDANSETRON HCL 4 MG/2ML IJ SOLN
4.0000 mg | Freq: Once | INTRAMUSCULAR | Status: AC | PRN
  Administered 2023-09-10: 4 mg via INTRAVENOUS
  Filled 2023-09-10: qty 2

## 2023-09-10 NOTE — ED Triage Notes (Signed)
 Pt BIB [facility] from [facility]. Pt presents with N/V that started this AM. Staff at [facility] gave pt an unknown medication for nausea this AM without relief. She has been unable to keep down her daily medications or any food or liquid. Pt has an associated HA.   EMS Vitals  190/95 HR 81 SpO2 92% on RA

## 2023-09-11 ENCOUNTER — Emergency Department (HOSPITAL_COMMUNITY)

## 2023-09-11 DIAGNOSIS — F32A Depression, unspecified: Secondary | ICD-10-CM

## 2023-09-11 DIAGNOSIS — R935 Abnormal findings on diagnostic imaging of other abdominal regions, including retroperitoneum: Secondary | ICD-10-CM | POA: Insufficient documentation

## 2023-09-11 DIAGNOSIS — R1110 Vomiting, unspecified: Secondary | ICD-10-CM | POA: Diagnosis not present

## 2023-09-11 DIAGNOSIS — Z8673 Personal history of transient ischemic attack (TIA), and cerebral infarction without residual deficits: Secondary | ICD-10-CM

## 2023-09-11 DIAGNOSIS — R112 Nausea with vomiting, unspecified: Secondary | ICD-10-CM | POA: Diagnosis not present

## 2023-09-11 DIAGNOSIS — E785 Hyperlipidemia, unspecified: Secondary | ICD-10-CM

## 2023-09-11 DIAGNOSIS — I10 Essential (primary) hypertension: Secondary | ICD-10-CM

## 2023-09-11 DIAGNOSIS — E876 Hypokalemia: Secondary | ICD-10-CM

## 2023-09-11 LAB — BASIC METABOLIC PANEL WITH GFR
Anion gap: 11 (ref 5–15)
BUN: 18 mg/dL (ref 8–23)
CO2: 30 mmol/L (ref 22–32)
Calcium: 9.6 mg/dL (ref 8.9–10.3)
Chloride: 93 mmol/L — ABNORMAL LOW (ref 98–111)
Creatinine, Ser: 0.63 mg/dL (ref 0.44–1.00)
GFR, Estimated: >60 mL/min (ref >60)
Glucose, Bld: 126 mg/dL — ABNORMAL HIGH (ref 70–99)
Potassium: 3.3 mmol/L — ABNORMAL LOW (ref 3.5–5.1)
Sodium: 134 mmol/L — ABNORMAL LOW (ref 135–145)

## 2023-09-11 LAB — TROPONIN I (HIGH SENSITIVITY): Troponin I (High Sensitivity): 5 ng/L (ref <18)

## 2023-09-11 LAB — CBC
HCT: 38.9 % (ref 36.0–46.0)
Hemoglobin: 13.3 g/dL (ref 12.0–15.0)
MCH: 29.8 pg (ref 26.0–34.0)
MCHC: 34.2 g/dL (ref 30.0–36.0)
MCV: 87 fL (ref 80.0–100.0)
Platelets: 380 10*3/uL (ref 150–400)
RBC: 4.47 MIL/uL (ref 3.87–5.11)
RDW: 14.3 % (ref 11.5–15.5)
WBC: 14.1 10*3/uL — ABNORMAL HIGH (ref 4.0–10.5)
nRBC: 0 % (ref 0.0–0.2)

## 2023-09-11 LAB — BRAIN NATRIURETIC PEPTIDE: B Natriuretic Peptide: 98 pg/mL (ref 0.0–100.0)

## 2023-09-11 LAB — MAGNESIUM: Magnesium: 2.2 mg/dL (ref 1.7–2.4)

## 2023-09-11 LAB — PHOSPHORUS: Phosphorus: 3.7 mg/dL (ref 2.5–4.6)

## 2023-09-11 LAB — LACTIC ACID, PLASMA: Lactic Acid, Venous: 1.3 mmol/L (ref 0.5–1.9)

## 2023-09-11 MED ORDER — TRAMADOL HCL 50 MG PO TABS
50.0000 mg | ORAL_TABLET | Freq: Two times a day (BID) | ORAL | Status: DC | PRN
  Administered 2023-09-12: 50 mg via ORAL
  Filled 2023-09-11: qty 1

## 2023-09-11 MED ORDER — ASPIRIN 81 MG PO TBEC
81.0000 mg | DELAYED_RELEASE_TABLET | Freq: Every day | ORAL | Status: DC
  Administered 2023-09-11 – 2023-09-13 (×3): 81 mg via ORAL
  Filled 2023-09-11 (×3): qty 1

## 2023-09-11 MED ORDER — MELATONIN 3 MG PO TABS: 6.0000 mg | ORAL_TABLET | Freq: Every evening | ORAL | Status: DC | PRN

## 2023-09-11 MED ORDER — ALBUTEROL SULFATE (2.5 MG/3ML) 0.083% IN NEBU: 2.5000 mg | INHALATION_SOLUTION | RESPIRATORY_TRACT | Status: DC | PRN

## 2023-09-11 MED ORDER — LOSARTAN POTASSIUM 50 MG PO TABS
50.0000 mg | ORAL_TABLET | Freq: Every day | ORAL | Status: DC
  Administered 2023-09-11 – 2023-09-13 (×3): 50 mg via ORAL
  Filled 2023-09-11 (×3): qty 1

## 2023-09-11 MED ORDER — BISACODYL 10 MG RE SUPP: 10.0000 mg | Freq: Every day | RECTAL | Status: DC | PRN

## 2023-09-11 MED ORDER — MIRTAZAPINE 15 MG PO TABS
15.0000 mg | ORAL_TABLET | Freq: Every day | ORAL | Status: DC
  Administered 2023-09-11 – 2023-09-12 (×2): 15 mg via ORAL
  Filled 2023-09-11 (×2): qty 1

## 2023-09-11 MED ORDER — LACTATED RINGERS IV BOLUS
1000.0000 mL | Freq: Once | INTRAVENOUS | Status: AC
  Administered 2023-09-11: 1000 mL via INTRAVENOUS

## 2023-09-11 MED ORDER — POTASSIUM CHLORIDE 10 MEQ/100ML IV SOLN
10.0000 meq | INTRAVENOUS | Status: AC
  Administered 2023-09-11 (×4): 10 meq via INTRAVENOUS
  Filled 2023-09-11 (×4): qty 100

## 2023-09-11 MED ORDER — POTASSIUM CHLORIDE CRYS ER 20 MEQ PO TBCR
40.0000 meq | EXTENDED_RELEASE_TABLET | Freq: Once | ORAL | Status: AC
  Administered 2023-09-11: 40 meq via ORAL
  Filled 2023-09-11: qty 2

## 2023-09-11 MED ORDER — POTASSIUM CHLORIDE CRYS ER 20 MEQ PO TBCR
40.0000 meq | EXTENDED_RELEASE_TABLET | ORAL | Status: AC
  Administered 2023-09-11 (×2): 40 meq via ORAL
  Filled 2023-09-11 (×2): qty 2

## 2023-09-11 MED ORDER — ONDANSETRON HCL 4 MG/2ML IJ SOLN: 4.0000 mg | Freq: Four times a day (QID) | INTRAMUSCULAR | Status: DC | PRN

## 2023-09-11 MED ORDER — POLYETHYLENE GLYCOL 3350 17 G PO PACK
17.0000 g | PACK | Freq: Two times a day (BID) | ORAL | Status: DC
  Administered 2023-09-11 – 2023-09-12 (×3): 17 g via ORAL
  Filled 2023-09-11 (×6): qty 1

## 2023-09-11 MED ORDER — ACETAMINOPHEN 500 MG PO TABS
1000.0000 mg | ORAL_TABLET | Freq: Four times a day (QID) | ORAL | Status: DC | PRN
  Administered 2023-09-11 – 2023-09-12 (×2): 1000 mg via ORAL
  Filled 2023-09-11 (×2): qty 2

## 2023-09-11 MED ORDER — SENNA 8.6 MG PO TABS
2.0000 | ORAL_TABLET | Freq: Every day | ORAL | Status: DC
  Administered 2023-09-11 – 2023-09-12 (×2): 17.2 mg via ORAL
  Filled 2023-09-11 (×2): qty 2

## 2023-09-11 MED ORDER — ATORVASTATIN CALCIUM 40 MG PO TABS
80.0000 mg | ORAL_TABLET | Freq: Every day | ORAL | Status: DC
  Administered 2023-09-11 – 2023-09-13 (×3): 80 mg via ORAL
  Filled 2023-09-11 (×3): qty 2

## 2023-09-11 NOTE — Assessment & Plan Note (Signed)
Continue with mirtazapine.  Patient was consulted per psychiatry, recommendations for outpatient follow up with behavioral health therapy.

## 2023-09-11 NOTE — ED Provider Notes (Signed)
 [place] EMERGENCY DEPARTMENT AT [facility] Provider Note   CSN: [id number] Arrival date & time: 09/10/23  2038     Patient presents with: No chief complaint on file.   Kendra Lee is a 68 y.o. female.   From a facility for emesis not responsive to home meds. Denies fever or pain. No trauma. No urinary symptoms. No diarrhea. Feeling better at this time.         Prior to Admission medications   Medication Sig Start Date End Date Taking? Authorizing Provider  acetaminophen  (TYLENOL ) 650 MG CR tablet Take 1,300 mg by mouth every 8 (eight) hours as needed for pain.    [provider]  alendronate  (FOSAMAX ) 70 MG tablet Take 70 mg by mouth once a week. 05/02/22   [provider]  amLODipine  (NORVASC ) 10 MG tablet Take 1 tablet (10 mg total) by mouth daily. 02/22/23   Vasireddy, Padmaja, MD  atorvastatin  (LIPITOR ) 80 MG tablet Take 80 mg by mouth daily. 12/19/22   [provider]  clopidogrel  (PLAVIX ) 75 MG tablet Take 1 tablet (75 mg total) by mouth daily. 02/25/23   Macdonald Savoy, MD  docusate sodium  (COLACE) 100 MG capsule Take 100 mg by mouth 2 (two) times daily.    [provider]  ipratropium-albuterol  (DUONEB) 0.5-2.5 (3) MG/3ML SOLN Take 3 mLs by nebulization every 6 (six) hours. 02/22/23   Vasireddy, Padmaja, MD  losartan  (COZAAR ) 100 MG tablet Take 1 tablet (100 mg total) by mouth daily. 02/22/23 03/24/23  Vasireddy, Padmaja, MD  mirtazapine  (REMERON ) 7.5 MG tablet Take 7.5 mg by mouth at bedtime. 06/09/22   [provider]  polyethylene glycol (MIRALAX ) 17 g packet Take 17 g by mouth daily. 02/26/23   Elisa Guest, PA-C  thiamine  (VITAMIN B1) 100 MG tablet Take 1 tablet (100 mg total) by mouth daily. 10/09/22   Rayfield Cairo, MD    Allergies: Patient has no known allergies.    Review of Systems  Updated Vital Signs BP (!) 165/77   Pulse 67   Temp 98.3 F (36.8 C) (Oral)   Resp 12   Ht 5' 4  (1.626 m)   Wt 47.6 kg   LMP  (LMP Unknown)   SpO2 95%   BMI 18.02 kg/m   Physical Exam Vitals and nursing note reviewed.  Constitutional:      Appearance: She is well-developed.  HENT:     Head: Normocephalic and atraumatic. Head contusion: poor detention.   Cardiovascular:     Rate and Rhythm: Normal rate and regular rhythm.  Pulmonary:     Effort: No respiratory distress.     Breath sounds: No stridor.  Abdominal:     General: There is no distension.   Musculoskeletal:     Cervical back: Normal range of motion.   Neurological:     Mental Status: She is alert.     (all labs ordered are listed, but only abnormal results are displayed) Labs Reviewed  COMPREHENSIVE METABOLIC PANEL WITH GFR - Abnormal; Notable for the following components:      Result Value   Potassium 3.1 (*)    Chloride 93 (*)    Glucose, Bld 170 (*)    All other components within normal limits  CBC - Abnormal; Notable for the following components:   WBC 18.1 (*)    All other components within normal limits  URINALYSIS, ROUTINE W REFLEX MICROSCOPIC - Abnormal; Notable for the following components:   Protein,  ur 30 (*)    All other components within normal limits  BASIC METABOLIC PANEL WITH GFR - Abnormal; Notable for the following components:   Sodium 134 (*)    Potassium 3.3 (*)    Chloride 93 (*)    Glucose, Bld 126 (*)    All other components within normal limits  CBC - Abnormal; Notable for the following components:   WBC 14.1 (*)    All other components within normal limits  LIPASE, BLOOD  BRAIN NATRIURETIC PEPTIDE  LACTIC ACID, PLASMA  MAGNESIUM   PHOSPHORUS  TROPONIN I (HIGH SENSITIVITY)    EKG: EKG Interpretation Date/Time:  Wednesday September 10 2023 21:09:58 EDT Ventricular Rate:  74 PR Interval:  176 QRS Duration:  94 QT Interval:  416 QTC Calculation: 461 R Axis:   70  Text Interpretation: Normal sinus rhythm Nonspecific ST and T wave abnormality Abnormal ECG When compared  with ECG of 17-Feb-2023 23:07, PREVIOUS ECG IS PRESENT Confirmed by Eve Hinders [phone]) on 09/11/2023 12:01:05 AM  Radiology: Lenell Query Chest 2 View Result Date: 09/11/2023 EXAM: 2 VIEW(S) XRAY OF THE CHEST 09/11/2023 12:55:00 AM COMPARISON: 10/07/2022 CLINICAL HISTORY: Eval for HTN/low oxygen, emesis. FINDINGS: LUNGS AND PLEURA: No focal pulmonary opacity. No pulmonary edema. No pleural effusion. No pneumothorax. HEART AND MEDIASTINUM: No acute abnormality of the cardiac and mediastinal silhouettes. BONES AND SOFT TISSUES: Mild degenerative changes at the mid thoracic spine. No acute osseous abnormality. IMPRESSION: 1. No acute process. Electronically signed by: Zadie Herter MD 09/11/2023 01:02 AM EDT RP Workstation: [id number]   CT ABDOMEN PELVIS W CONTRAST Result Date: 09/10/2023 CLINICAL DATA:  Nausea and vomiting. EXAM: CT ABDOMEN AND PELVIS WITH CONTRAST TECHNIQUE: Multidetector CT imaging of the abdomen and pelvis was performed using the standard protocol following bolus administration of intravenous contrast. RADIATION DOSE REDUCTION: This exam was performed according to the departmental dose-optimization program which includes automated exposure control, adjustment of the mA and/or kV according to patient size and/or use of iterative reconstruction technique. CONTRAST:  OMNIPAQUE  IOHEXOL  300 MG/ML  SOLN COMPARISON:  February 26, 2023 FINDINGS: Lower chest: Mild areas of atelectasis are seen within the bilateral lung bases. Hepatobiliary: No focal liver abnormality is seen. No gallstones, gallbladder wall thickening, or biliary dilatation. Pancreas: Unremarkable. No pancreatic ductal dilatation or surrounding inflammatory changes. Spleen: Normal in size without focal abnormality. Adrenals/Urinary Tract: Adrenal glands are unremarkable. The right kidney is normal in size, without renal calculi, focal lesion, or hydronephrosis. The left kidney is atrophic in appearance. Stable marked severity  left-sided hydronephrosis is seen without evidence of obstructing renal calculi. Bladder is unremarkable. Stomach/Bowel: Stomach is within normal limits. The appendix is not visualized. A large stool burden is seen throughout the large bowel. No evidence of bowel wall thickening, distention, or inflammatory changes. Noninflamed diverticula are seen throughout the sigmoid colon. A small area of counter clockwise mesenteric twisting is seen within the pelvis on the right (axial CT images 45 through 53, CT series 2). This represents a new finding when compared to the prior study. Vascular/Lymphatic: Aortic atherosclerosis with stable 2.7 cm diameter aneurysmal dilatation of the infrarenal abdominal aorta. No enlarged abdominal or pelvic lymph nodes. Reproductive: Uterus and bilateral adnexa are unremarkable. Other: No abdominal wall hernia or abnormality. No abdominopelvic ascites. Musculoskeletal: A metallic density intramedullary rod and compression screw device are seen within the proximal right femur. Multilevel degenerative changes seen throughout the lumbar spine. IMPRESSION: 1. Small area of counter-clockwise mesenteric twisting within the pelvis on the right, without  evidence of bowel obstruction. Correlation with short-term follow-up abdomen and pelvis CT is recommended if an early bowel obstruction is of clinical concern. 2. Large stool burden throughout the large bowel. 3. Sigmoid diverticulosis. 4. Stable marked severity left-sided hydronephrosis. 5. 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 6. Aortic atherosclerosis. Electronically Signed   By: Virgle Grime M.D.   On: 09/10/2023 23:01     Procedures   Medications Ordered in the ED  bisacodyl  (DULCOLAX) suppository 10 mg (has no administration in time range)  polyethylene glycol (MIRALAX  / GLYCOLAX ) packet 17 g (17 g Oral Given 09/11/23 0215)  senna (SENOKOT) tablet 17.2 mg (has no administration in time  range)  acetaminophen  (TYLENOL ) tablet 1,000 mg (1,000 mg Oral Given 09/11/23 0246)  albuterol  (PROVENTIL ) (2.5 MG/3ML) 0.083% nebulizer solution 2.5 mg (has no administration in time range)  ondansetron  (ZOFRAN ) injection 4 mg (has no administration in time range)  melatonin tablet 6 mg (has no administration in time range)  potassium chloride  10 mEq in 100 mL IVPB (10 mEq Intravenous New Bag/Given 09/11/23 0443)  aspirin  EC tablet 81 mg (has no administration in time range)  mirtazapine  (REMERON ) tablet 15 mg (has no administration in time range)  atorvastatin  (LIPITOR ) tablet 80 mg (has no administration in time range)  traMADol (ULTRAM) tablet 50 mg (has no administration in time range)  ondansetron  (ZOFRAN ) injection 4 mg (4 mg Intravenous Given 09/10/23 2109)  iohexol  (OMNIPAQUE ) 300 MG/ML solution 100 mL (100 mLs Intravenous Contrast Given 09/10/23 2231)  potassium chloride  SA (KLOR-CON  M) CR tablet 40 mEq (40 mEq Oral Given 09/11/23 0022)  lactated ringers  bolus 1,000 mL (1,000 mLs Intravenous New Bag/Given 09/11/23 0215)                                    Medical Decision Making Amount and/or Complexity of Data Reviewed Labs: ordered. Radiology: ordered.  Risk Prescription drug management. Decision regarding hospitalization.   Ecg with slight depression likely related to bp however with slightly low O2 will get trop, bnp and xr to ensure emesis isn't cardiac. Ct with possible volvulus, however patient doesn't appear obstructed. Will po challenge and see how she does. If still not tolerating po. May need admission w/ surgical consultation.  Tolerated sips of water . Not food. Abdomen still soft. Will d/w TRH for admission for repeat abdominal exams and CT or surgical consultation as necessary.      Final diagnoses:  Nausea and vomiting, unspecified vomiting type    ED Discharge Orders     None          Greogry Goodwyn, Reymundo Caulk, MD 09/11/23 [phone]

## 2023-09-11 NOTE — Progress Notes (Signed)
 Mobility Specialist Progress Note:    09/11/23 1511  Mobility  Activity Ambulated with assistance in hallway;Transferred from bed to chair  Level of Assistance Contact guard assist, steadying assist  Assistive Device Front wheel walker  Distance Ambulated (ft) 45 ft  Range of Motion/Exercises Active;All extremities  Activity Response Tolerated well  Mobility Referral Yes  Mobility visit 1 Mobility  Mobility Specialist Start Time (ACUTE ONLY) 1432  Mobility Specialist Stop Time (ACUTE ONLY) 1454  Mobility Specialist Time Calculation (min) (ACUTE ONLY) 22 min   Pt received in bed, agreeable to mobility. Required CGA to stand and ambulate with RW. Tolerated well, asx throughout. Left pt in chair, alarm on and call bell in reach. All needs met.   Glinda Lapping Mobility Specialist Please contact via Special educational needs teacher or  Rehab office at [phone]

## 2023-09-11 NOTE — Assessment & Plan Note (Signed)
 Hyponatremia,  Renal function with serum cr at 0,63 with K at 3,3 and serum bicarbonate at 30  Na 134 Mg 2,2   Plan to continue K correction with kcl, will add 40 meq x 2 doses and follow up renal function and electrolytes in am. Continue to hold on IV fluids.

## 2023-09-11 NOTE — ED Notes (Addendum)
 Pt states that she would rather not try the enema at this time of night as she is tired and would like to try and rest. This RN convinced pt to try the Miralax  at this time of night to see if it would help before enema needs to be done. Pt agreed and drank medication.

## 2023-09-11 NOTE — Progress Notes (Addendum)
 Progress Note   Patient: Kendra Lee ZOX:[id number] DOB: November 15, 1955 DOA: 09/10/2023     0 DOS: the patient was seen and examined on 09/11/2023   Brief hospital course: Mrs, Scow was admitted to the hospital with the working diagnosis of nausea and vomiting.   68 yo female with the past medical history of CVA, COPD, hypertension, and abdominal aortic aneurysm who presented with nausea and vomiting. Reported acute onset of nausea and vomiting, not able to tolerate liquids, no associated abdominal pain. On her initial physical examination her blood pressure was 188/87, HR 79, RR 20 and 02 saturation 93%  Lungs with no wheezing or rales, heart with S1 and S2 present and regular with no gallops or rubs, abdomen with decreased bowel sounds with non tender with no rebound or guarding, no lower extremity edema.   Na 135, K 3,1 Cl 93 bicarbonate 28, glucose 170 bun 18 cr 0,70 AST 27 and ALT 20  Wbc 18,1 hgb 13,9 plt 338    Urine analysis SG 1,015 protein 30, negative leukocytes and negative hgb   CT abdomen and pelvis with small area of counter clockwise mesenteric twisting within the pelvis on the right, without evidence of bowel obstruction.  Large stool burden throughout the large bowel.  Sigmoid diverticulosis Stable marked severity left sided hydronephrosis, Stable 2,7 cm diameter aneurysmal dilatation of the infrarenal abdominal aorta.   Chest radiograph with no cardiomegaly, no effusions or infiltrates.   EKG 74 bpm, normal axis, normal intervals, qtc 461, sinus rhythm with no significant ST segment or T wave changes.   Assessment and Plan: * Intractable nausea and vomiting No frank bowel obstruction, her symptoms have improved but not yet back to baseline.   Plan to continue supportive medical therapy with as needed antiemetics.  Advance diet to soft and continue monitoring Will hold on IV fluids for now.   Reactive leukocytosis, no signs of acute bacterial infection,  continue to hold on antibiotic therapy.  Wbc is trending down.   Hypokalemia Hyponatremia,  Renal function with serum cr at 0,63 with K at 3,3 and serum bicarbonate at 30  Na 134 Mg 2,2   Plan to continue K correction with kcl, will add 40 meq x 2 doses and follow up renal function and electrolytes in am. Continue to hold on IV fluids.   Essential hypertension Continue blood pressure monitoring  Resume losartan  for blood pressure control will hold on hydrochlorothiazide  to avoid electrolyte disturbances.    Dyslipidemia Continue statin therapy   History of CVA (cerebrovascular accident) Continue blood pressure control, continue with asa for antiplatelet therapy Continue high dose, high potency statin with atorvastatin  80 mg daily.   Depression Continue with mirtazapine     Subjective: Patient with improvement in nausea and vomiting, but not yet back to baseline, she has been on clear liquids.   Physical Exam: Vitals:   09/11/23 0708 09/11/23 0800 09/11/23 0953 09/11/23 1352  BP: (!) 176/85 (!) 175/82 (!) 193/68 (!) 164/77  Pulse: 70 64 (!) 57 64  Resp: 17 14 18 18   Temp: 98.5 F (36.9 C)  97.6 F (36.4 C) 98.3 F (36.8 C)  TempSrc: Oral  Oral Oral  SpO2: 95% 94% 95% 95%  Weight:      Height:       Neurology awake and alert, deconditioned ENT with mild pallor Cardiovascular with S1 and S2 present and regular with no gallops rubs or murmurs Respiratory with no rales or wheezing, no rhonchi  Abdomen with  mild distention and tympanic to percussion, is soft and non tender, no guarding or rebound  No lower extremity edema  Data Reviewed:    Family Communication: no family at the bedside   Disposition: Status is: Observation The patient remains OBS appropriate and will d/c before 2 midnights.  Planned Discharge Destination: Home    Author: Albertus Alt, MD 09/11/2023 3:11 PM  For on call review [email or web address].

## 2023-09-11 NOTE — TOC Initial Note (Signed)
 Transition of Care [facility]) - Initial/Assessment Note    Patient Details  Name: Kendra Lee MRN: [id number] Date of Birth: Nov 14, 1955  Transition of Care [facility]) CM/SW Contact:    Geraldina Klinefelter, RN Phone Number: 09/11/2023, 3:55 PM  Clinical Narrative:                 Pt admitted for observation. Plans to return to [facility] at [employer] [employer].   Expected Discharge Plan: Long Term Nursing Home Barriers to Discharge: Continued Medical Work up   Patient Goals and CMS Choice Patient states their goals for this hospitalization and ongoing recovery are:: Return to [facility]       Expected Discharge Plan and Services In-house Referral: Clinical Social Work Discharge Planning Services: CM Consult   Living arrangements for the past 2 months: Assisted Living Facility                 Prior Living Arrangements/Services Living arrangements for the past 2 months: Assisted Living Facility Lives with:: Facility Resident Patient language and need for interpreter reviewed:: Yes Do you feel safe going back to the place where you live?: Yes      Need for Family Participation in Patient Care: No (Comment) Care giver support system in place?: Yes (comment) Current home services: DME (wc, walker, cane) Criminal Activity/Legal Involvement Pertinent to Current Situation/Hospitalization: No - Comment as needed  Activities of Daily Living   ADL Screening (condition at time of admission) Independently performs ADLs?: No Does the patient have a NEW difficulty with bathing/dressing/toileting/self-feeding that is expected to last >3 days?: No Does the patient have a NEW difficulty with getting in/out of bed, walking, or climbing stairs that is expected to last >3 days?: No Does the patient have a NEW difficulty with communication that is expected to last >3 days?: No Is the patient deaf or have difficulty hearing?: No Does the patient have difficulty seeing, even when wearing glasses/contacts?: No Does the  patient have difficulty concentrating, remembering, or making decisions?: No  Permission Sought/Granted Permission sought to share information with : Case Manager, Magazine features editor Permission granted to share information with : Yes, Verbal Permission Granted    Emotional Assessment Appearance:: Appears older than stated age Attitude/Demeanor/Rapport: Engaged Affect (typically observed): Appropriate Orientation: : Oriented to Self, Oriented to Place, Oriented to Situation Alcohol  / Substance Use: Tobacco Use (smoking cessation added to AVS) Psych Involvement: No (comment)  Admission diagnosis:  Intractable nausea and vomiting [R11.2] Nausea and vomiting, unspecified vomiting type [R11.2] Patient Active Problem List   Diagnosis Date Noted   Intractable nausea and vomiting 09/11/2023   Abnormal CT of the abdomen 09/11/2023   History of CVA (cerebrovascular accident) 09/11/2023   Depression 09/11/2023   Dyslipidemia 02/18/2023   Hyponatremia 02/18/2023   Malnutrition of moderate degree 02/18/2023   Closed right hip fracture (HCC) 02/17/2023   SBO (small bowel obstruction) (HCC) 10/04/2022   Gastrostomy in place [facility]) 10/04/2022   Vitamin D  deficiency 10/02/2020   Dental caries 10/02/2020   Dysphagia 10/02/2020   Hypokalemia 10/02/2020   Acute metabolic encephalopathy 09/18/2020   CVA (cerebral vascular accident) (HCC) 09/18/2020   Leukocytosis 09/18/2020   Essential hypertension 09/18/2020   PCP:  Wyvonna Heidelberg, MD Pharmacy:   [facility],  - [address] [place] [address] Phone: [phone] Fax: [phone]  Social Drivers of Health (SDOH) Social History: SDOH Screenings   Food Insecurity: No Food Insecurity (09/11/2023)  Housing: Unknown (09/11/2023)  Transportation Needs: No Transportation Needs (09/11/2023)  Utilities: Not At Risk (09/11/2023)  Social Connections: Moderately Integrated (09/11/2023)  Tobacco  Use: High Risk (09/10/2023)   SDOH Interventions:   Readmission Risk Interventions     No data to display

## 2023-09-11 NOTE — Plan of Care (Signed)
   Problem: Activity: Goal: Risk for activity intolerance will decrease Outcome: Progressing   Problem: Coping: Goal: Level of anxiety will decrease Outcome: Progressing   Problem: Safety: Goal: Ability to remain free from injury will improve Outcome: Progressing

## 2023-09-11 NOTE — H&P (Addendum)
 History and Physical    Kendra Lee ZOX:[id number] DOB: 05-14-1955 DOA: 09/10/2023  PCP: Fanta, Tesfaye Demissie, MD   Patient coming from: [facility]    Chief Complaint: N/V    HPI:  Kendra Lee is a 68 y.o. female with hx of CVA, COPD, former smoking, HTN, severe L hydronephrosis, abdominal aortic aneurysm, who presents with acute N/V and PO intolerance. Reports since this morning unable to tolerate even water  without having vomiting. No Gi symptoms in days prior. Does have constipation, but recently had been better and having BM almost every day. No associated abd pain. Nausea improving with antiemetics in the ED. Otherwise did have recent sore throat and completed a course of antibiotics for this, has resolved. No fever, chills. No other recent illness.      Review of Systems:  ROS complete and negative except as marked above   No Known Allergies  Prior to Admission medications   Medication Sig Start Date End Date Taking? Authorizing Provider  acetaminophen  (TYLENOL ) 650 MG CR tablet Take 1,300 mg by mouth every 8 (eight) hours as needed for pain.    [provider]  alendronate  (FOSAMAX ) 70 MG tablet Take 70 mg by mouth once a week. 05/02/22   [provider]  amLODipine  (NORVASC ) 10 MG tablet Take 1 tablet (10 mg total) by mouth daily. 02/22/23   Vasireddy, Padmaja, MD  atorvastatin  (LIPITOR ) 80 MG tablet Take 80 mg by mouth daily. 12/19/22   [provider]  clopidogrel  (PLAVIX ) 75 MG tablet Take 1 tablet (75 mg total) by mouth daily. 02/25/23   Macdonald Savoy, MD  docusate sodium  (COLACE) 100 MG capsule Take 100 mg by mouth 2 (two) times daily.    [provider]  ipratropium-albuterol  (DUONEB) 0.5-2.5 (3) MG/3ML SOLN Take 3 mLs by nebulization every 6 (six) hours. 02/22/23   Vasireddy, Padmaja, MD  losartan  (COZAAR ) 100 MG tablet Take 1 tablet (100 mg total) by mouth daily. 02/22/23 03/24/23  Vasireddy, Padmaja, MD  mirtazapine   (REMERON ) 7.5 MG tablet Take 7.5 mg by mouth at bedtime. 06/09/22   [provider]  polyethylene glycol (MIRALAX ) 17 g packet Take 17 g by mouth daily. 02/26/23   Elisa Guest, PA-C  thiamine  (VITAMIN B1) 100 MG tablet Take 1 tablet (100 mg total) by mouth daily. 10/09/22   Rayfield Cairo, MD    Past Medical History:  Diagnosis Date   CVA (cerebral vascular accident) (HCC)    Dementia (HCC)    Dysphagia    Hypertension    Hypokalemia    Smoker    2 ppd x 55 years    Past Surgical History:  Procedure Laterality Date   ABDOMINAL HYSTERECTOMY     late 1980s   INTRAMEDULLARY (IM) NAIL INTERTROCHANTERIC Right 02/18/2023   Procedure: INTRAMEDULLARY (IM) NAIL INTERTROCHANTERIC;  Surgeon: Hardy Lia, MD;  Location: [facility] OR;  Service: Orthopedics;  Laterality: Right;   IR GASTROSTOMY TUBE MOD SED  09/22/2020   IR GASTROSTOMY TUBE MOD SED  10/04/2020     reports that she has been smoking cigarettes. She has never used smokeless tobacco. She reports that she does not drink alcohol  and does not use drugs.  History reviewed. No pertinent family history.   Physical Exam: Vitals:   09/10/23 2051 09/11/23 0120  BP: (!) 188/87 (!) 171/90  Pulse: 79 76  Resp: 20 18  Temp: 98.3 F (36.8 C)   TempSrc: Oral   SpO2: 92% 93%  Weight:  47.6 kg   Height: 5' 4 (1.626 m)     Gen: Awake, alert, Elderly, frail  HEENT: poor dentition   CV: Regular, normal S1, S2, no murmurs  Resp: Normal WOB, CTAB  Abd: Flat, hypoactive, nontender, no rebound, guarding, rigidity  MSK: Symmetric, no edema  Skin: No rashes or lesions to exposed skin  Neuro: Alert and interactive  Psych: euthymic, appropriate    Data review:   Labs reviewed, notable for:   K 3.1  Bicarb 28, AG 14  WBC 18   Micro:  Results for orders placed or performed during the hospital encounter of 02/17/23  Surgical pcr screen     Status: None   Collection Time: 02/18/23  1:01 AM   Specimen: Nasal Mucosa;  Nasal Swab  Result Value Ref Range Status   MRSA, PCR NEGATIVE NEGATIVE Final   Staphylococcus aureus NEGATIVE NEGATIVE Final    Comment: (NOTE) The Xpert SA Assay (FDA approved for NASAL specimens in patients 43 years of age and older), is one component of a comprehensive surveillance program. It is not intended to diagnose infection nor to guide or monitor treatment. Performed at [facility], [address], [place], [place] [address]     Imaging reviewed:  DG Chest 2 View Result Date: 09/11/2023 EXAM: 2 VIEW(S) XRAY OF THE CHEST 09/11/2023 12:55:00 AM COMPARISON: 10/07/2022 CLINICAL HISTORY: Eval for HTN/low oxygen, emesis. FINDINGS: LUNGS AND PLEURA: No focal pulmonary opacity. No pulmonary edema. No pleural effusion. No pneumothorax. HEART AND MEDIASTINUM: No acute abnormality of the cardiac and mediastinal silhouettes. BONES AND SOFT TISSUES: Mild degenerative changes at the mid thoracic spine. No acute osseous abnormality. IMPRESSION: 1. No acute process. Electronically signed by: Zadie Herter MD 09/11/2023 01:02 AM EDT RP Workstation: [id number]   CT ABDOMEN PELVIS W CONTRAST Result Date: 09/10/2023 CLINICAL DATA:  Nausea and vomiting. EXAM: CT ABDOMEN AND PELVIS WITH CONTRAST TECHNIQUE: Multidetector CT imaging of the abdomen and pelvis was performed using the standard protocol following bolus administration of intravenous contrast. RADIATION DOSE REDUCTION: This exam was performed according to the departmental dose-optimization program which includes automated exposure control, adjustment of the mA and/or kV according to patient size and/or use of iterative reconstruction technique. CONTRAST:  OMNIPAQUE  IOHEXOL  300 MG/ML  SOLN COMPARISON:  February 26, 2023 FINDINGS: Lower chest: Mild areas of atelectasis are seen within the bilateral lung bases. Hepatobiliary: No focal liver abnormality is seen. No gallstones, gallbladder wall thickening, or biliary dilatation. Pancreas:  Unremarkable. No pancreatic ductal dilatation or surrounding inflammatory changes. Spleen: Normal in size without focal abnormality. Adrenals/Urinary Tract: Adrenal glands are unremarkable. The right kidney is normal in size, without renal calculi, focal lesion, or hydronephrosis. The left kidney is atrophic in appearance. Stable marked severity left-sided hydronephrosis is seen without evidence of obstructing renal calculi. Bladder is unremarkable. Stomach/Bowel: Stomach is within normal limits. The appendix is not visualized. A large stool burden is seen throughout the large bowel. No evidence of bowel wall thickening, distention, or inflammatory changes. Noninflamed diverticula are seen throughout the sigmoid colon. A small area of counter clockwise mesenteric twisting is seen within the pelvis on the right (axial CT images 45 through 53, CT series 2). This represents a new finding when compared to the prior study. Vascular/Lymphatic: Aortic atherosclerosis with stable 2.7 cm diameter aneurysmal dilatation of the infrarenal abdominal aorta. No enlarged abdominal or pelvic lymph nodes. Reproductive: Uterus and bilateral adnexa are unremarkable. Other: No abdominal wall hernia or abnormality. No abdominopelvic ascites.  Musculoskeletal: A metallic density intramedullary rod and compression screw device are seen within the proximal right femur. Multilevel degenerative changes seen throughout the lumbar spine. IMPRESSION: 1. Small area of counter-clockwise mesenteric twisting within the pelvis on the right, without evidence of bowel obstruction. Correlation with short-term follow-up abdomen and pelvis CT is recommended if an early bowel obstruction is of clinical concern. 2. Large stool burden throughout the large bowel. 3. Sigmoid diverticulosis. 4. Stable marked severity left-sided hydronephrosis. 5. 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 6. Aortic atherosclerosis.  Electronically Signed   By: Virgle Grime M.D.   On: 09/10/2023 23:01     ED Course:  Treated with Kcl 40 meq, zofran      Assessment/Plan:  68 y.o. female with hx CVA, COPD, former smoking, HTN, severe L hydronephrosis, abdominal aortic aneurysm, who presents with acute N/V and PO intolerance.   N/V, PO intolerance  Abnormal CT imaging, mesenteric twisting R pelvis without signs of volvulus or bowel obstruction  Acute onset of symptoms x 1 day. Has associated dehydration, hypokalemia. CT with abnormal finding per above although questionable clinical significance, does have large stool burden. Suspect may have viral gastroenteritis as cause of symptoms, vs ileus related to opiate use. Brought in for observation to ensure able to tolerate POs and achieve symptom control, no worsening abd exam to suggest acute process with abnormal mesentery.  - Give 1 L LR, then continue oral hydration as able.  - Clear liquid diet, advance as tolerates - symptomatic management: zofran  prn  - Bowel regimen - Serial abd exam, consider repeat imaging if clinical concern for developing obstruction, volvulus. Currently suspicion is low.   Hypokalemia  - Repleted, check Mg  Chronic medical problems:  Hx CVA: Continue home aspirin , Atorvastatin   COPD: duonebs prn  HTN: Hold home Losartan -hydrochlorothiazide  with volume depletion  Severe L hydronephrosis: Incidentally noted on imaging stable from prior. Renal function near baseline (0.5-0.6) Abdominal aortic aneurysm: 2.7 cm, stable on imaging. Outpatient surveillance.   Chronic pain: continue home tramadol, needs to continue bowel regimen outpatient   Body mass index is 18.02 kg/m. Mild protein calorie malnutrition; continue Mirtazapine     DVT prophylaxis:  SCDs Code Status:  DNR/DNI(Do NOT Intubate) Diet:  Diet Orders (From admission, onward)     Start     Ordered   09/11/23 0159  Diet clear liquid Room service appropriate? Yes; Fluid  consistency: Thin  Diet effective now       Question Answer Comment  Room service appropriate? Yes   Fluid consistency: Thin      09/11/23 0200           Family Communication:  No   Consults:  None   Admission status:   Observation, Med-Surg  Severity of Illness: The appropriate patient status for this patient is OBSERVATION. Observation status is judged to be reasonable and necessary in order to provide the required intensity of service to ensure the patient's safety. The patient's presenting symptoms, physical exam findings, and initial radiographic and laboratory data in the context of their medical condition is felt to place them at decreased risk for further clinical deterioration. Furthermore, it is anticipated that the patient will be medically stable for discharge from the hospital within 2 midnights of admission.    Arnulfo Larch, MD [facility]  How to contact the [facility] Attending or Consulting provider 7A - 7P or covering provider during after hours 7P -7A, for this patient.  Check the care team  in [facility] and look for a) attending/consulting [facility] provider listed and b) the [facility] team listed Log into [email or web address] and use [facility] universal password to access. If you do not have the password, please contact the hospital operator. Locate the [facility] provider you are looking for under [facility] and page to a number that you can be directly reached. If you still have difficulty reaching the provider, please page the [facility] (Director on Call) for the Hospitalists listed on amion for assistance.  09/11/2023, 2:57 AM

## 2023-09-11 NOTE — Assessment & Plan Note (Addendum)
 Continue statin therapy.

## 2023-09-11 NOTE — Assessment & Plan Note (Addendum)
 No frank bowel obstruction, her symptoms have improved but not yet back to baseline.   Plan to continue supportive medical therapy with as needed antiemetics.  Advance diet to soft and continue monitoring Will hold on IV fluids for now.   Reactive leukocytosis, no signs of acute bacterial infection, continue to hold on antibiotic therapy.  Wbc is trending down.

## 2023-09-11 NOTE — Care Management Obs Status (Signed)
 MEDICARE OBSERVATION STATUS NOTIFICATION   Patient Details  Name: Kendra Lee MRN: [id number] Date of Birth: 01-25-1956   Medicare Observation Status Notification Given:  Yes    Geraldina Klinefelter, RN 09/11/2023, 1:16 PM

## 2023-09-11 NOTE — Hospital Course (Signed)
 Mrs, Kendra Lee was admitted to the hospital with the working diagnosis of nausea and vomiting.   68 yo female with the past medical history of CVA, COPD, hypertension, and abdominal aortic aneurysm who presented with nausea and vomiting. Reported acute onset of nausea and vomiting, not able to tolerate liquids, no associated abdominal pain. On her initial physical examination her blood pressure was 188/87, HR 79, RR 20 and 02 saturation 93%  Lungs with no wheezing or rales, heart with S1 and S2 present and regular with no gallops or rubs, abdomen with decreased bowel sounds with non tender with no rebound or guarding, no lower extremity edema.   Na 135, K 3,1 Cl 93 bicarbonate 28, glucose 170 bun 18 cr 0,70 AST 27 and ALT 20  Wbc 18,1 hgb 13,9 plt 338    Urine analysis SG 1,015 protein 30, negative leukocytes and negative hgb   CT abdomen and pelvis with small area of counter clockwise mesenteric twisting within the pelvis on the right, without evidence of bowel obstruction.  Large stool burden throughout the large bowel.  Sigmoid diverticulosis Stable marked severity left sided hydronephrosis, Stable 2,7 cm diameter aneurysmal dilatation of the infrarenal abdominal aorta.   Chest radiograph with no cardiomegaly, no effusions or infiltrates.   EKG 74 bpm, normal axis, normal intervals, qtc 461, sinus rhythm with no significant ST segment or T wave changes.

## 2023-09-11 NOTE — Assessment & Plan Note (Addendum)
 Continue blood pressure control, continue with asa for antiplatelet therapy Continue high dose, high potency statin with atorvastatin  80 mg daily.

## 2023-09-11 NOTE — Assessment & Plan Note (Signed)
 Continue blood pressure monitoring  Resume losartan  for blood pressure control will hold on hydrochlorothiazide  to avoid electrolyte disturbances.

## 2023-09-12 DIAGNOSIS — Z8673 Personal history of transient ischemic attack (TIA), and cerebral infarction without residual deficits: Secondary | ICD-10-CM | POA: Diagnosis not present

## 2023-09-12 DIAGNOSIS — E785 Hyperlipidemia, unspecified: Secondary | ICD-10-CM | POA: Diagnosis not present

## 2023-09-12 DIAGNOSIS — F32A Depression, unspecified: Secondary | ICD-10-CM | POA: Diagnosis not present

## 2023-09-12 DIAGNOSIS — E876 Hypokalemia: Secondary | ICD-10-CM | POA: Diagnosis not present

## 2023-09-12 DIAGNOSIS — R112 Nausea with vomiting, unspecified: Secondary | ICD-10-CM | POA: Diagnosis not present

## 2023-09-12 DIAGNOSIS — I10 Essential (primary) hypertension: Secondary | ICD-10-CM | POA: Diagnosis not present

## 2023-09-12 LAB — CBC
HCT: 41.5 % (ref 36.0–46.0)
Hemoglobin: 14 g/dL (ref 12.0–15.0)
MCH: 29.8 pg (ref 26.0–34.0)
MCHC: 33.7 g/dL (ref 30.0–36.0)
MCV: 88.3 fL (ref 80.0–100.0)
Platelets: 364 10*3/uL (ref 150–400)
RBC: 4.7 MIL/uL (ref 3.87–5.11)
RDW: 14.6 % (ref 11.5–15.5)
WBC: 10.1 10*3/uL (ref 4.0–10.5)
nRBC: 0 % (ref 0.0–0.2)

## 2023-09-12 LAB — BASIC METABOLIC PANEL WITH GFR
Anion gap: 7 (ref 5–15)
BUN: 13 mg/dL (ref 8–23)
CO2: 25 mmol/L (ref 22–32)
Calcium: 9.4 mg/dL (ref 8.9–10.3)
Chloride: 104 mmol/L (ref 98–111)
Creatinine, Ser: 0.57 mg/dL (ref 0.44–1.00)
GFR, Estimated: >60 mL/min (ref >60)
Glucose, Bld: 98 mg/dL (ref 70–99)
Potassium: 4.5 mmol/L (ref 3.5–5.1)
Sodium: 136 mmol/L (ref 135–145)

## 2023-09-12 LAB — MAGNESIUM: Magnesium: 2.3 mg/dL (ref 1.7–2.4)

## 2023-09-12 NOTE — Progress Notes (Signed)
  Progress Note   Patient: Kendra Lee ZOX:[id number] DOB: 06-29-1955 DOA: 09/10/2023     0 DOS: the patient was seen and examined on 09/12/2023   Brief hospital admission narrative: As per H&P written by Dr. Amy Kansky on 09/11/2023 Kendra Lee is a 68 y.o. female with hx of CVA, COPD, former smoking, HTN, severe L hydronephrosis, abdominal aortic aneurysm, who presents with acute N/V and PO intolerance. Reports since this morning unable to tolerate even water  without having vomiting. No Gi symptoms in days prior. Does have constipation, but recently had been better and having BM almost every day. No associated abd pain. Nausea improving with antiemetics in the ED. Otherwise did have recent sore throat and completed a course of antibiotics for this, has resolved. No fever, chills. No other recent illness.       Assessment and Plan: * Intractable nausea and vomiting -No frank bowel obstruction, her symptoms have improved but not yet back to baseline.  - Continue advancing diet - Continue bowel regimen therapy - Continue to maintain adequate hydration and replete electrolytes - Hopefully able to discharge in the next 1 to 2 days.  Hypokalemia/ Hyponatremia, - Improved with hydration and electrolyte repletion - Currently stable and within normal limits - Continue to follow trend/stability.  Essential hypertension -Continue to follow vital sign - Continue holding HCTZ - Continue losartan .  Dyslipidemia -Continue Lipitor  - Heart healthy diet recommended.  History of CVA (cerebrovascular accident) -Continue blood pressure control, continue with asa for antiplatelet therapy -Continue statin.  Depression -Continue with mirtazapine   - No suicidal ideation.   Subjective:  Reported no nausea or vomiting; still expressing abdominal distention and decreased oral intake capacity.  Generally weak and chronically ill in appearance.  Physical Exam: Vitals:   09/11/23 2132 09/11/23 2200  09/12/23 0433 09/12/23 1312  BP: (!) 194/96 (!) 152/74 (!) 186/78 130/85  Pulse: 73  72 99  Resp: 18  20 18   Temp: 98 F (36.7 C)  98.4 F (36.9 C) 98.3 F (36.8 C)  TempSrc: Oral  Oral   SpO2: 95%  94% 93%  Weight:      Height:       General exam: Alert, awake, oriented x 3; generally weak and chronically ill in appearance.  Reports no abdominal pain.  Reported still feeling abdominal distention and decreased oral intake. Respiratory system: Good saturation on room air. Cardiovascular system:RRR. No murmurs, rubs, gallops. Gastrointestinal system: Abdomen is nondistended, soft and without guarding.  Positive bowel sounds. Central nervous system: Moving 4 limbs spontaneously.  No focal neurological deficits. Extremities: No cyanosis or clubbing. Skin: No petechiae. Psychiatry: Flat affect appreciated on exam.  Data Reviewed: Basic metabolic panel: Sodium 136, potassium 4.5, chloride 104, bicarb 25, BUN 13, creatinine >60 CBC: WBCs 10.1, hemoglobin 4.0 and platelet count 364K Magnesium :2.3    Family Communication: no family at the bedside   Disposition: Status is: Observation The patient remains OBS appropriate and will d/c before 2 midnights.   Planned Discharge Destination: Home (ALF)  Author: Justina Oman, MD 09/12/2023 7:12 PM  For on call review [email or web address].

## 2023-09-12 NOTE — Plan of Care (Signed)
   Problem: Activity: Goal: Risk for activity intolerance will decrease Outcome: Progressing   Problem: Coping: Goal: Level of anxiety will decrease Outcome: Progressing   Problem: Safety: Goal: Ability to remain free from injury will improve Outcome: Progressing

## 2023-09-12 NOTE — Plan of Care (Signed)

## 2023-09-13 DIAGNOSIS — E785 Hyperlipidemia, unspecified: Secondary | ICD-10-CM | POA: Diagnosis not present

## 2023-09-13 DIAGNOSIS — I10 Essential (primary) hypertension: Secondary | ICD-10-CM | POA: Diagnosis not present

## 2023-09-13 DIAGNOSIS — Z8673 Personal history of transient ischemic attack (TIA), and cerebral infarction without residual deficits: Secondary | ICD-10-CM | POA: Diagnosis not present

## 2023-09-13 DIAGNOSIS — F32A Depression, unspecified: Secondary | ICD-10-CM | POA: Diagnosis not present

## 2023-09-13 DIAGNOSIS — R112 Nausea with vomiting, unspecified: Secondary | ICD-10-CM | POA: Diagnosis not present

## 2023-09-13 MED ORDER — SENNA 8.6 MG PO TABS: 2.0000 | ORAL_TABLET | Freq: Every day | ORAL | 2 refills | Status: AC

## 2023-09-13 MED ORDER — TRAMADOL HCL 50 MG PO TABS: 50.0000 mg | ORAL_TABLET | Freq: Two times a day (BID) | ORAL | 0 refills | Status: AC | PRN

## 2023-09-13 MED ORDER — POLYETHYLENE GLYCOL 3350 17 G PO PACK: 17.0000 g | PACK | Freq: Two times a day (BID) | ORAL | 1 refills | Status: AC

## 2023-09-13 NOTE — Discharge Summary (Signed)
 Physician Discharge Summary   Patient: Kendra Lee MRN: [id number] DOB: 01-19-1956  Admit date:     09/10/2023  Discharge date: 09/13/23  Discharge Physician: Eric Nunnery   PCP: Carlette Benita Area, MD   Recommendations at discharge:  Repeat basic metabolic panel and follow electrolytes renal function Repeat magnesium  level and follow trend/stability. Reassess blood pressure and adjust antihypertensive as needed.  Discharge Diagnoses: Principal Problem:   Intractable nausea and vomiting Active Problems:   Hypokalemia   Essential hypertension   Dyslipidemia   History of CVA (cerebrovascular accident)   Depression   Brief hospital admission narrative: As per H&P written by Dr. Keturah on 09/11/2023 Kendra Lee is a 68 y.o. female with hx of CVA, COPD, former smoking, HTN, severe L hydronephrosis, abdominal aortic aneurysm, who presents with acute N/V and PO intolerance. Reports since this morning unable to tolerate even water  without having vomiting. No Gi symptoms in days prior. Does have constipation, but recently had been better and having BM almost every day. No associated abd pain. Nausea improving with antiemetics in the ED. Otherwise did have recent sore throat and completed a course of antibiotics for this, has resolved. No fever, chills. No other recent illness.       Assessment and Plan:  Intractable nausea and vomiting -No frank bowel obstruction, patient symptoms has continued improvement and at the moment as present better capability to take oral intake. - Continue advancing diet and follow response. - Continue bowel regimen therapy - Continue to maintain adequate hydration and replete electrolytes - Hemodynamically stable and ready to discharge back to assisted living facility. - Outpatient follow-up with PCP in 2 days.   Hypokalemia/ Hyponatremia, - Improved with hydration and electrolyte repletion - Currently stable and within normal limits -  Continue to follow trend/stability with repeat basic metabolic panel at follow-up visit..   Essential hypertension -Continue to follow vital sign - Resume home losartan /HCTZ antihypertensive agents - Follow vital signs and blood pressure fluctuation.   Dyslipidemia -Continue Lipitor  - Heart healthy diet recommended.   History of CVA (cerebrovascular accident) -Continue blood pressure control, continue with asa for antiplatelet therapy -Continue statin.   Depression -Continue with mirtazapine   - No suicidal ideation or hallucinations.    Consultants: None Procedures performed: See below for x-ray reports. Disposition: Assisted living Diet recommendation: Soft heart healthy/low-sodium diet with thin liquids.  DISCHARGE MEDICATION: Allergies as of 09/13/2023   No Known Allergies      Medication List     STOP taking these medications    amoxicillin 500 MG capsule Commonly known as: AMOXIL   benzonatate 100 MG capsule Commonly known as: TESSALON       TAKE these medications    acetaminophen  650 MG CR tablet Commonly known as: TYLENOL  Take 650 mg by mouth every 8 (eight) hours as needed for pain.   alendronate  70 MG tablet Commonly known as: FOSAMAX  Take 70 mg by mouth every Friday.   Aspirin  Low Dose 81 MG chewable tablet Generic drug: aspirin  Chew 81 mg by mouth daily.   atorvastatin  80 MG tablet Commonly known as: LIPITOR  Take 80 mg by mouth daily.   Calcium  Carb-Cholecalciferol 600-5 MG-MCG Tabs Take 1 tablet by mouth daily.   cyanocobalamin  1000 MCG tablet Commonly known as: VITAMIN B12 Take 1,000 mcg by mouth daily.   hydrOXYzine 10 MG tablet Commonly known as: ATARAX Take 10 mg by mouth every 6 (six) hours as needed for anxiety.   losartan -hydrochlorothiazide  50-12.5 MG tablet Commonly  known as: HYZAAR Take 1 tablet by mouth daily.   mirtazapine  15 MG tablet Commonly known as: REMERON  Take 15 mg by mouth at bedtime.   Mucinex DM 30-600  MG Tb12 Take 1 tablet by mouth every 12 (twelve) hours as needed (cough/congestion).   polyethylene glycol 17 g packet Commonly known as: MiraLax  Take 17 g by mouth 2 (two) times daily. Hold for diarrhea What changed:  when to take this additional instructions   senna 8.6 MG Tabs tablet Commonly known as: SENOKOT Take 2 tablets (17.2 mg total) by mouth at bedtime.   traMADol  50 MG tablet Commonly known as: ULTRAM  Take 1 tablet (50 mg total) by mouth every 12 (twelve) hours as needed for severe pain (pain score 7-10). What changed:  when to take this reasons to take this        Follow-up Information     Fanta, Tesfaye Demissie, MD. Schedule an appointment as soon as possible for a visit in 10 day(s).   Specialty: Internal Medicine Contact information: [address] [place] [address] [phone]                Discharge Exam: Fredricka Weights   09/10/23 2051  Weight: 47.6 kg   General exam: Alert, awake, oriented x 3; generally weak and chronically ill in appearance.  Reports improvement in her overall abdominal discomfort and fullness sensation.  Reports having multiple movements and tolerating diet better. Respiratory system: Clear to auscultation. Respiratory effort normal.  Good saturation on room air. Cardiovascular system:RRR. No rubs or gallops; no JVD. Gastrointestinal system: Abdomen is nondistended, soft and with no guarding; positive bowel sounds. Central nervous system: No focal neurological deficits. Extremities: No cyanosis or clubbing Skin: No petechiae. Psychiatry: Flat affect appreciated on exam.  Condition at discharge: Stable and improved.  The results of significant diagnostics from this hospitalization (including imaging, microbiology, ancillary and laboratory) are listed below for reference.   Imaging Studies: DG Chest 2 View Result Date: 09/11/2023 EXAM: 2 VIEW(S) XRAY OF THE CHEST 09/11/2023 12:55:00 AM COMPARISON:  10/07/2022 CLINICAL HISTORY: Eval for HTN/low oxygen, emesis. FINDINGS: LUNGS AND PLEURA: No focal pulmonary opacity. No pulmonary edema. No pleural effusion. No pneumothorax. HEART AND MEDIASTINUM: No acute abnormality of the cardiac and mediastinal silhouettes. BONES AND SOFT TISSUES: Mild degenerative changes at the mid thoracic spine. No acute osseous abnormality. IMPRESSION: 1. No acute process. Electronically signed by: Pinkie Pebbles MD 09/11/2023 01:02 AM EDT RP Workstation: [id number]   CT ABDOMEN PELVIS W CONTRAST Result Date: 09/10/2023 CLINICAL DATA:  Nausea and vomiting. EXAM: CT ABDOMEN AND PELVIS WITH CONTRAST TECHNIQUE: Multidetector CT imaging of the abdomen and pelvis was performed using the standard protocol following bolus administration of intravenous contrast. RADIATION DOSE REDUCTION: This exam was performed according to the departmental dose-optimization program which includes automated exposure control, adjustment of the mA and/or kV according to patient size and/or use of iterative reconstruction technique. CONTRAST:  OMNIPAQUE  IOHEXOL  300 MG/ML  SOLN COMPARISON:  February 26, 2023 FINDINGS: Lower chest: Mild areas of atelectasis are seen within the bilateral lung bases. Hepatobiliary: No focal liver abnormality is seen. No gallstones, gallbladder wall thickening, or biliary dilatation. Pancreas: Unremarkable. No pancreatic ductal dilatation or surrounding inflammatory changes. Spleen: Normal in size without focal abnormality. Adrenals/Urinary Tract: Adrenal glands are unremarkable. The right kidney is normal in size, without renal calculi, focal lesion, or hydronephrosis. The left kidney is atrophic in appearance. Stable marked severity left-sided hydronephrosis is seen without evidence of obstructing  renal calculi. Bladder is unremarkable. Stomach/Bowel: Stomach is within normal limits. The appendix is not visualized. A large stool burden is seen throughout the large bowel. No  evidence of bowel wall thickening, distention, or inflammatory changes. Noninflamed diverticula are seen throughout the sigmoid colon. A small area of counter clockwise mesenteric twisting is seen within the pelvis on the right (axial CT images 45 through 53, CT series 2). This represents a new finding when compared to the prior study. Vascular/Lymphatic: Aortic atherosclerosis with stable 2.7 cm diameter aneurysmal dilatation of the infrarenal abdominal aorta. No enlarged abdominal or pelvic lymph nodes. Reproductive: Uterus and bilateral adnexa are unremarkable. Other: No abdominal wall hernia or abnormality. No abdominopelvic ascites. Musculoskeletal: A metallic density intramedullary rod and compression screw device are seen within the proximal right femur. Multilevel degenerative changes seen throughout the lumbar spine. IMPRESSION: 1. Small area of counter-clockwise mesenteric twisting within the pelvis on the right, without evidence of bowel obstruction. Correlation with short-term follow-up abdomen and pelvis CT is recommended if an early bowel obstruction is of clinical concern. 2. Large stool burden throughout the large bowel. 3. Sigmoid diverticulosis. 4. Stable marked severity left-sided hydronephrosis. 5. 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 6. Aortic atherosclerosis. Electronically Signed   By: Suzen Dials M.D.   On: 09/10/2023 23:01    Microbiology: Results for orders placed or performed during the hospital encounter of 02/17/23  Surgical pcr screen     Status: None   Collection Time: 02/18/23  1:01 AM   Specimen: Nasal Mucosa; Nasal Swab  Result Value Ref Range Status   MRSA, PCR NEGATIVE NEGATIVE Final   Staphylococcus aureus NEGATIVE NEGATIVE Final    Comment: (NOTE) The Xpert SA Assay (FDA approved for NASAL specimens in patients 30 years of age and older), is one component of a comprehensive surveillance program. It is not intended to  diagnose infection nor to guide or monitor treatment. Performed at [facility], [address], [place], [place] [address]     Labs: CBC: Recent Labs  Lab 09/10/23 2104 09/11/23 0214 09/12/23 0448  WBC 18.1* 14.1* 10.1  HGB 13.9 13.3 14.0  HCT 40.5 38.9 41.5  MCV 87.7 87.0 88.3  PLT 338 380 364   Basic Metabolic Panel: Recent Labs  Lab 09/10/23 2104 09/11/23 0214 09/12/23 0448  NA 135 134* 136  K 3.1* 3.3* 4.5  CL 93* 93* 104  CO2 28 30 25   GLUCOSE 170* 126* 98  BUN 18 18 13   CREATININE 0.70 0.63 0.57  CALCIUM  10.1 9.6 9.4  MG  --  2.2 2.3  PHOS  --  3.7  --    Liver Function Tests: Recent Labs  Lab 09/10/23 2104  AST 27  ALT 20  ALKPHOS 64  BILITOT 0.4  PROT 7.6  ALBUMIN  4.1   CBG: No results for input(s): GLUCAP in the last 168 hours.  Discharge time spent: greater than 30 minutes.  Signed: Eric Nunnery, MD [facility] 09/13/2023

## 2023-09-13 NOTE — NC FL2 (Signed)
 [place]  MEDICAID [id number] LEVEL OF CARE FORM     IDENTIFICATION  Patient Name: Kendra Lee Birthdate: Aug 17, 1955 Sex: female Admission Date (Current Location): 09/10/2023  [place] and [place] Number:  Ray[place] [id number] S Facility and Address:  [facility],  [address], [place] [address]      Provider Number: [id number]  Attending Physician Name and Address:  Ricky Fines, MD  Relative Name and Phone Number:  [place] (son) [phone]    Current Level of Care: Hospital Recommended Level of Care: Assisted Living Facility Prior Approval Number:    Date Approved/Denied:   PASRR Number: [id number]  Discharge Plan: Other (Comment) [facility]F)    Current Diagnoses: Patient Active Problem List   Diagnosis Date Noted   Intractable nausea and vomiting 09/11/2023   Abnormal CT of the abdomen 09/11/2023   History of CVA (cerebrovascular accident) 09/11/2023   Depression 09/11/2023   Dyslipidemia 02/18/2023   Hyponatremia 02/18/2023   Malnutrition of moderate degree 02/18/2023   Closed right hip fracture (HCC) 02/17/2023   SBO (small bowel obstruction) (HCC) 10/04/2022   Gastrostomy in place [facility]) 10/04/2022   Vitamin D  deficiency 10/02/2020   Dental caries 10/02/2020   Dysphagia 10/02/2020   Hypokalemia 10/02/2020   Acute metabolic encephalopathy 09/18/2020   CVA (cerebral vascular accident) (HCC) 09/18/2020   Leukocytosis 09/18/2020   Essential hypertension 09/18/2020    Orientation RESPIRATION BLADDER Height & Weight     Self, Time, Situation, Place  Normal Incontinent Weight: 47.6 kg Height:  5' 4 (162.6 cm)  BEHAVIORAL SYMPTOMS/MOOD NEUROLOGICAL BOWEL NUTRITION STATUS      Continent Diet (see dc summary)  AMBULATORY STATUS COMMUNICATION OF NEEDS Skin   Limited Assist Verbally Normal                       Personal Care Assistance Level of Assistance  Bathing, Dressing Bathing Assistance: Maximum assistance   Dressing  Assistance: Maximum assistance     Functional Limitations Info  Sight, Hearing, Speech Sight Info: Adequate Hearing Info: Adequate Speech Info: Adequate    SPECIAL CARE FACTORS FREQUENCY                       Contractures Contractures Info: Not present    Additional Factors Info                  Current Medications (09/13/2023):  This is the current hospital active medication list Current Facility-Administered Medications  Medication Dose Route Frequency Provider Last Rate Last Admin   acetaminophen  (TYLENOL ) tablet 1,000 mg  1,000 mg Oral Q6H PRN Keturah Carrier, MD   1,000 mg at 09/12/23 1801   albuterol  (PROVENTIL ) (2.5 MG/3ML) 0.083% nebulizer solution 2.5 mg  2.5 mg Nebulization Q4H PRN Keturah Carrier, MD       aspirin  EC tablet 81 mg  81 mg Oral Daily Segars, Jonathan, MD   81 mg at 09/13/23 9160   atorvastatin  (LIPITOR ) tablet 80 mg  80 mg Oral Daily Segars, Jonathan, MD   80 mg at 09/13/23 9160   bisacodyl  (DULCOLAX) suppository 10 mg  10 mg Rectal Daily PRN Segars, Jonathan, MD       losartan  (COZAAR ) tablet 50 mg  50 mg Oral Daily Arrien, Elidia Sieving, MD   50 mg at 09/13/23 9160   melatonin tablet 6 mg  6 mg Oral QHS PRN Segars, Jonathan, MD       mirtazapine  (REMERON ) tablet 15 mg  15  mg Oral QHS Segars, Jonathan, MD   15 mg at 09/12/23 2055   ondansetron  (ZOFRAN ) injection 4 mg  4 mg Intravenous Q6H PRN Segars, Dorn, MD       polyethylene glycol (MIRALAX  / GLYCOLAX ) packet 17 g  17 g Oral BID Segars, Jonathan, MD   17 g at 09/12/23 9166   senna (SENOKOT) tablet 17.2 mg  2 tablet Oral QHS Segars, Jonathan, MD   17.2 mg at 09/12/23 2055   traMADol  (ULTRAM ) tablet 50 mg  50 mg Oral Q12H PRN Segars, Jonathan, MD   50 mg at 09/12/23 2100     Discharge Medications: Please see discharge summary for a list of discharge medications.  Relevant Imaging Results:  Relevant Lab Results:   Additional Information [id number]  Nena LITTIE Coffee,  RN

## 2023-09-13 NOTE — Progress Notes (Signed)
 [facility] called to clarify if pt was being D/C today. Per MD pt is medically ready however the facility  states they are unable to take pts back on Saturday and Sunday.

## 2023-09-13 NOTE — NC FL2 (Signed)
 [place]  MEDICAID [id number] LEVEL OF CARE FORM     IDENTIFICATION  Patient Name: Kendra Lee Birthdate: January 12, 1956 Sex: female Admission Date (Current Location): 09/10/2023  [place] and [place] Number:  Raynaldo [id number] S Facility and Address:  [facility],  [address], [place] [address]      Provider Number: [id number]  Attending Physician Name and Address:  Ricky Fines, MD  Relative Name and Phone Number:  [place] (son) [phone]    Current Level of Care: Hospital Recommended Level of Care: Skilled Nursing Facility Prior Approval Number:    Date Approved/Denied:   PASRR Number: [id number]  Discharge Plan: SNF    Current Diagnoses: Patient Active Problem List   Diagnosis Date Noted   Intractable nausea and vomiting 09/11/2023   Abnormal CT of the abdomen 09/11/2023   History of CVA (cerebrovascular accident) 09/11/2023   Depression 09/11/2023   Dyslipidemia 02/18/2023   Hyponatremia 02/18/2023   Malnutrition of moderate degree 02/18/2023   Closed right hip fracture (HCC) 02/17/2023   SBO (small bowel obstruction) (HCC) 10/04/2022   Gastrostomy in place [facility]) 10/04/2022   Vitamin D  deficiency 10/02/2020   Dental caries 10/02/2020   Dysphagia 10/02/2020   Hypokalemia 10/02/2020   Acute metabolic encephalopathy 09/18/2020   CVA (cerebral vascular accident) (HCC) 09/18/2020   Leukocytosis 09/18/2020   Essential hypertension 09/18/2020    Orientation RESPIRATION BLADDER Height & Weight     Self, Time, Situation, Place  Normal Incontinent Weight: 47.6 kg Height:  5' 4 (162.6 cm)  BEHAVIORAL SYMPTOMS/MOOD NEUROLOGICAL BOWEL NUTRITION STATUS      Continent Diet (see dc summary)  AMBULATORY STATUS COMMUNICATION OF NEEDS Skin   Limited Assist Verbally Normal                       Personal Care Assistance Level of Assistance  Bathing, Dressing Bathing Assistance: Maximum assistance   Dressing Assistance: Maximum assistance      Functional Limitations Info  Sight, Hearing, Speech Sight Info: Adequate Hearing Info: Adequate Speech Info: Adequate    SPECIAL CARE FACTORS FREQUENCY                       Contractures Contractures Info: Not present    Additional Factors Info                  Current Medications (09/13/2023):  This is the current hospital active medication list Current Facility-Administered Medications  Medication Dose Route Frequency Provider Last Rate Last Admin   acetaminophen  (TYLENOL ) tablet 1,000 mg  1,000 mg Oral Q6H PRN Keturah Carrier, MD   1,000 mg at 09/12/23 1801   albuterol  (PROVENTIL ) (2.5 MG/3ML) 0.083% nebulizer solution 2.5 mg  2.5 mg Nebulization Q4H PRN Keturah Carrier, MD       aspirin  EC tablet 81 mg  81 mg Oral Daily Segars, Jonathan, MD   81 mg at 09/13/23 9160   atorvastatin  (LIPITOR ) tablet 80 mg  80 mg Oral Daily Segars, Jonathan, MD   80 mg at 09/13/23 9160   bisacodyl  (DULCOLAX) suppository 10 mg  10 mg Rectal Daily PRN Segars, Jonathan, MD       losartan  (COZAAR ) tablet 50 mg  50 mg Oral Daily Arrien, Mauricio Daniel, MD   50 mg at 09/13/23 9160   melatonin tablet 6 mg  6 mg Oral QHS PRN Segars, Jonathan, MD       mirtazapine  (REMERON ) tablet 15 mg  15 mg Oral QHS Segars,  Dorn, MD   15 mg at 09/12/23 2055   ondansetron  (ZOFRAN ) injection 4 mg  4 mg Intravenous Q6H PRN Segars, Dorn, MD       polyethylene glycol (MIRALAX  / GLYCOLAX ) packet 17 g  17 g Oral BID Segars, Jonathan, MD   17 g at 09/12/23 9166   senna (SENOKOT) tablet 17.2 mg  2 tablet Oral QHS Segars, Jonathan, MD   17.2 mg at 09/12/23 2055   traMADol  (ULTRAM ) tablet 50 mg  50 mg Oral Q12H PRN Segars, Jonathan, MD   50 mg at 09/12/23 2100     Discharge Medications: Please see discharge summary for a list of discharge medications.  Relevant Imaging Results:  Relevant Lab Results:   Additional Information [id number]  Nena LITTIE Coffee, RN

## 2023-09-13 NOTE — Plan of Care (Signed)

## 2023-09-13 NOTE — Progress Notes (Signed)
  Progress Note   Patient: Kendra Lee FMW:[id number] DOB: [birth year] DOA: 09/10/2023     0 DOS: the patient was seen and examined on 09/13/2023   Brief hospital admission narrative: As per H&P written by Dr. Keturah on 09/11/2023 Kendra Lee is a 68 y.o. female with hx of CVA, COPD, former smoking, HTN, severe L hydronephrosis, abdominal aortic aneurysm, who presents with acute N/V and PO intolerance. Reports since this morning unable to tolerate even water  without having vomiting. No Gi symptoms in days prior. Does have constipation, but recently had been better and having BM almost every day. No associated abd pain. Nausea improving with antiemetics in the ED. Otherwise did have recent sore throat and completed a course of antibiotics for this, has resolved. No fever, chills. No other recent illness.       Assessment and Plan: * Intractable nausea and vomiting -No frank bowel obstruction, patient symptoms has continued improvement and at the moment as present better capability to take oral intake. - Continue advancing diet and follow response. - Continue bowel regimen therapy - Continue to maintain adequate hydration and replete electrolytes - Hopefully able to discharge back to assisted living facility on Monday (facility unable to take patient's back over the weekend).  Hypokalemia/ Hyponatremia, - Improved with hydration and electrolyte repletion - Currently stable and within normal limits - Continue to follow trend/stability.  Essential hypertension -Continue to follow vital sign - Continue holding HCTZ - Continue losartan .  Dyslipidemia -Continue Lipitor  - Heart healthy diet recommended.  History of CVA (cerebrovascular accident) -Continue blood pressure control, continue with asa for antiplatelet therapy -Continue statin.  Depression -Continue with mirtazapine   - No suicidal ideation or hallucinations.   Subjective:  Afebrile, no chest pain, no nausea or  vomiting.  Patient reports no shortness of breath and demonstrate good saturation on room air.  Suppressing multiple bowel movements and overall feeling better regarding abdominal discomfort and fullness sensation.  Physical Exam: Vitals:   09/12/23 2146 09/13/23 0502 09/13/23 0830 09/13/23 1300  BP: 135/84 113/75 106/65 94/66  Pulse: 72 68 90 77  Resp: 20 14 17 18   Temp: 98.4 F (36.9 C) 97.7 F (36.5 C)  97.8 F (36.6 C)  TempSrc: Oral Oral  Oral  SpO2: 93% 92% 90% 92%  Weight:      Height:       General exam: Alert, awake, oriented x 3; generally weak and chronically ill in appearance.  Reports improvement in her overall abdominal discomfort and fullness sensation.  Reports having multiple movements and tolerating diet better. Respiratory system: Clear to auscultation. Respiratory effort normal.  Good saturation on room air. Cardiovascular system:RRR. No rubs or gallops; no JVD. Gastrointestinal system: Abdomen is nondistended, soft and with no guarding; positive bowel sounds. Central nervous system: No focal neurological deficits. Extremities: No cyanosis or clubbing Skin: No petechiae. Psychiatry: Flat affect appreciated on exam.  Latest data Reviewed: Basic metabolic panel: Sodium 136, potassium 4.5, chloride 104, bicarb 25, BUN 13, creatinine >60 CBC: WBCs 10.1, hemoglobin 4.0 and platelet count 364K Magnesium :2.3   Family Communication: no family at the bedside   Disposition: Status is: Observation  The patient remains OBS appropriate and will d/c before 2 midnights.   Planned Discharge Destination: Home (ALF); patient is hemodynamically stable but unable to return back to assisted living facility on 09/15/2023.  Author: Eric Nunnery, MD 09/13/2023 3:02 PM  For on call review [email or web address].

## 2023-09-13 NOTE — [place] FL2 (Deleted)
 [place]  MEDICAID [id number] LEVEL OF CARE FORM     IDENTIFICATION  Patient Name: Kendra Lee Birthdate: January 12, 1956 Sex: female Admission Date (Current Location): 09/10/2023  [place] and [place] Number:  [place] [id number] Facility and Address:  [facility],  [address], [place] [address]      Provider Number: [id number]  Attending Physician Name and Address:  Ricky Fines, MD  Relative Name and Phone Number:  [place] (son) [phone]    Current Level of Care: Hospital Recommended Level of Care: Skilled Nursing Facility Prior Approval Number:    Date Approved/Denied:   PASRR Number: [id number]  Discharge Plan: SNF    Current Diagnoses: Patient Active Problem List   Diagnosis Date Noted   Intractable nausea and vomiting 09/11/2023   Abnormal CT of the abdomen 09/11/2023   History of CVA (cerebrovascular accident) 09/11/2023   Depression 09/11/2023   Dyslipidemia 02/18/2023   Hyponatremia 02/18/2023   Malnutrition of moderate degree 02/18/2023   Closed right hip fracture (HCC) 02/17/2023   SBO (small bowel obstruction) (HCC) 10/04/2022   Gastrostomy in place [facility]) 10/04/2022   Vitamin D  deficiency 10/02/2020   Dental caries 10/02/2020   Dysphagia 10/02/2020   Hypokalemia 10/02/2020   Acute metabolic encephalopathy 09/18/2020   CVA (cerebral vascular accident) (HCC) 09/18/2020   Leukocytosis 09/18/2020   Essential hypertension 09/18/2020    Orientation RESPIRATION BLADDER Height & Weight     Self, Time, Situation, Place  Normal Incontinent Weight: 47.6 kg Height:  5' 4 (162.6 cm)  BEHAVIORAL SYMPTOMS/MOOD NEUROLOGICAL BOWEL NUTRITION STATUS      Continent Diet (see dc summary)  AMBULATORY STATUS COMMUNICATION OF NEEDS Skin   Limited Assist Verbally Normal                       Personal Care Assistance Level of Assistance  Bathing, Dressing Bathing Assistance: Maximum assistance   Dressing Assistance: Maximum assistance      Functional Limitations Info  Sight, Hearing, Speech Sight Info: Adequate Hearing Info: Adequate Speech Info: Adequate    SPECIAL CARE FACTORS FREQUENCY                       Contractures Contractures Info: Not present    Additional Factors Info                  Current Medications (09/13/2023):  This is the current hospital active medication list Current Facility-Administered Medications  Medication Dose Route Frequency Provider Last Rate Last Admin   acetaminophen  (TYLENOL ) tablet 1,000 mg  1,000 mg Oral Q6H PRN Keturah Carrier, MD   1,000 mg at 09/12/23 1801   albuterol  (PROVENTIL ) (2.5 MG/3ML) 0.083% nebulizer solution 2.5 mg  2.5 mg Nebulization Q4H PRN Keturah Carrier, MD       aspirin  EC tablet 81 mg  81 mg Oral Daily Segars, Jonathan, MD   81 mg at 09/13/23 9160   atorvastatin  (LIPITOR ) tablet 80 mg  80 mg Oral Daily Segars, Jonathan, MD   80 mg at 09/13/23 9160   bisacodyl  (DULCOLAX) suppository 10 mg  10 mg Rectal Daily PRN Segars, Jonathan, MD       losartan  (COZAAR ) tablet 50 mg  50 mg Oral Daily Arrien, Mauricio Daniel, MD   50 mg at 09/13/23 9160   melatonin tablet 6 mg  6 mg Oral QHS PRN Segars, Jonathan, MD       mirtazapine  (REMERON ) tablet 15 mg  15 mg Oral QHS Segars,  Dorn, MD   15 mg at 09/12/23 2055   ondansetron  (ZOFRAN ) injection 4 mg  4 mg Intravenous Q6H PRN Segars, Dorn, MD       polyethylene glycol (MIRALAX  / GLYCOLAX ) packet 17 g  17 g Oral BID Segars, Jonathan, MD   17 g at 09/12/23 9166   senna (SENOKOT) tablet 17.2 mg  2 tablet Oral QHS Segars, Jonathan, MD   17.2 mg at 09/12/23 2055   traMADol  (ULTRAM ) tablet 50 mg  50 mg Oral Q12H PRN Segars, Jonathan, MD   50 mg at 09/12/23 2100     Discharge Medications: Please see discharge summary for a list of discharge medications.  Relevant Imaging Results:  Relevant Lab Results:   Additional Information [id number]  Nena LITTIE Coffee, RN

## 2023-09-16 DIAGNOSIS — M6281 Muscle weakness (generalized): Secondary | ICD-10-CM | POA: Diagnosis not present

## 2023-09-17 DIAGNOSIS — R112 Nausea with vomiting, unspecified: Secondary | ICD-10-CM | POA: Diagnosis not present

## 2023-09-17 DIAGNOSIS — I10 Essential (primary) hypertension: Secondary | ICD-10-CM | POA: Diagnosis not present

## 2023-09-17 DIAGNOSIS — E876 Hypokalemia: Secondary | ICD-10-CM | POA: Diagnosis not present

## 2023-09-22 DIAGNOSIS — M6281 Muscle weakness (generalized): Secondary | ICD-10-CM | POA: Diagnosis not present

## 2023-09-23 DIAGNOSIS — I10 Essential (primary) hypertension: Secondary | ICD-10-CM | POA: Diagnosis not present

## 2023-09-24 DIAGNOSIS — M6281 Muscle weakness (generalized): Secondary | ICD-10-CM | POA: Diagnosis not present

## 2023-09-29 DIAGNOSIS — M6281 Muscle weakness (generalized): Secondary | ICD-10-CM | POA: Diagnosis not present

## 2023-09-30 DIAGNOSIS — M6281 Muscle weakness (generalized): Secondary | ICD-10-CM | POA: Diagnosis not present

## 2023-10-01 DIAGNOSIS — M6281 Muscle weakness (generalized): Secondary | ICD-10-CM | POA: Diagnosis not present

## 2023-10-03 DIAGNOSIS — M6281 Muscle weakness (generalized): Secondary | ICD-10-CM | POA: Diagnosis not present

## 2023-10-06 DIAGNOSIS — M6281 Muscle weakness (generalized): Secondary | ICD-10-CM | POA: Diagnosis not present

## 2023-10-10 DIAGNOSIS — I10 Essential (primary) hypertension: Secondary | ICD-10-CM | POA: Diagnosis not present

## 2023-10-10 DIAGNOSIS — J42 Unspecified chronic bronchitis: Secondary | ICD-10-CM | POA: Diagnosis not present

## 2023-10-10 DIAGNOSIS — I480 Paroxysmal atrial fibrillation: Secondary | ICD-10-CM | POA: Diagnosis not present

## 2023-10-10 DIAGNOSIS — M6281 Muscle weakness (generalized): Secondary | ICD-10-CM | POA: Diagnosis not present

## 2023-10-13 DIAGNOSIS — M6281 Muscle weakness (generalized): Secondary | ICD-10-CM | POA: Diagnosis not present

## 2023-10-16 DIAGNOSIS — M6281 Muscle weakness (generalized): Secondary | ICD-10-CM | POA: Diagnosis not present

## 2023-10-17 DIAGNOSIS — M6281 Muscle weakness (generalized): Secondary | ICD-10-CM | POA: Diagnosis not present

## 2023-10-20 DIAGNOSIS — M6281 Muscle weakness (generalized): Secondary | ICD-10-CM | POA: Diagnosis not present

## 2023-10-21 DIAGNOSIS — M6281 Muscle weakness (generalized): Secondary | ICD-10-CM | POA: Diagnosis not present

## 2023-10-22 DIAGNOSIS — M6281 Muscle weakness (generalized): Secondary | ICD-10-CM | POA: Diagnosis not present

## 2023-10-23 DIAGNOSIS — M6281 Muscle weakness (generalized): Secondary | ICD-10-CM | POA: Diagnosis not present

## 2023-10-27 DIAGNOSIS — M6281 Muscle weakness (generalized): Secondary | ICD-10-CM | POA: Diagnosis not present

## 2023-10-28 DIAGNOSIS — M6281 Muscle weakness (generalized): Secondary | ICD-10-CM | POA: Diagnosis not present

## 2023-10-29 DIAGNOSIS — M6281 Muscle weakness (generalized): Secondary | ICD-10-CM | POA: Diagnosis not present

## 2023-10-30 DIAGNOSIS — M6281 Muscle weakness (generalized): Secondary | ICD-10-CM | POA: Diagnosis not present

## 2023-11-03 DIAGNOSIS — M6281 Muscle weakness (generalized): Secondary | ICD-10-CM | POA: Diagnosis not present

## 2023-11-04 DIAGNOSIS — M6281 Muscle weakness (generalized): Secondary | ICD-10-CM | POA: Diagnosis not present

## 2023-11-06 DIAGNOSIS — M6281 Muscle weakness (generalized): Secondary | ICD-10-CM | POA: Diagnosis not present

## 2023-11-07 DIAGNOSIS — M6281 Muscle weakness (generalized): Secondary | ICD-10-CM | POA: Diagnosis not present

## 2023-11-10 DIAGNOSIS — M6281 Muscle weakness (generalized): Secondary | ICD-10-CM | POA: Diagnosis not present

## 2023-11-10 DIAGNOSIS — I10 Essential (primary) hypertension: Secondary | ICD-10-CM | POA: Diagnosis not present

## 2023-11-10 DIAGNOSIS — I5022 Chronic systolic (congestive) heart failure: Secondary | ICD-10-CM | POA: Diagnosis not present

## 2023-11-11 DIAGNOSIS — M6281 Muscle weakness (generalized): Secondary | ICD-10-CM | POA: Diagnosis not present

## 2023-11-13 DIAGNOSIS — M6281 Muscle weakness (generalized): Secondary | ICD-10-CM | POA: Diagnosis not present

## 2023-11-14 DIAGNOSIS — M6281 Muscle weakness (generalized): Secondary | ICD-10-CM | POA: Diagnosis not present

## 2023-11-17 DIAGNOSIS — M6281 Muscle weakness (generalized): Secondary | ICD-10-CM | POA: Diagnosis not present

## 2023-11-18 DIAGNOSIS — M6281 Muscle weakness (generalized): Secondary | ICD-10-CM | POA: Diagnosis not present

## 2023-11-20 DIAGNOSIS — M6281 Muscle weakness (generalized): Secondary | ICD-10-CM | POA: Diagnosis not present

## 2023-11-21 DIAGNOSIS — M6281 Muscle weakness (generalized): Secondary | ICD-10-CM | POA: Diagnosis not present

## 2023-11-25 DIAGNOSIS — M6281 Muscle weakness (generalized): Secondary | ICD-10-CM | POA: Diagnosis not present

## 2023-11-26 DIAGNOSIS — M6281 Muscle weakness (generalized): Secondary | ICD-10-CM | POA: Diagnosis not present

## 2023-11-27 DIAGNOSIS — M6281 Muscle weakness (generalized): Secondary | ICD-10-CM | POA: Diagnosis not present

## 2023-12-01 DIAGNOSIS — M6281 Muscle weakness (generalized): Secondary | ICD-10-CM | POA: Diagnosis not present

## 2023-12-02 DIAGNOSIS — M6281 Muscle weakness (generalized): Secondary | ICD-10-CM | POA: Diagnosis not present

## 2023-12-03 DIAGNOSIS — Z23 Encounter for immunization: Secondary | ICD-10-CM | POA: Diagnosis not present

## 2023-12-04 DIAGNOSIS — M6281 Muscle weakness (generalized): Secondary | ICD-10-CM | POA: Diagnosis not present

## 2023-12-05 DIAGNOSIS — M6281 Muscle weakness (generalized): Secondary | ICD-10-CM | POA: Diagnosis not present

## 2023-12-08 DIAGNOSIS — M6281 Muscle weakness (generalized): Secondary | ICD-10-CM | POA: Diagnosis not present

## 2023-12-09 DIAGNOSIS — M6281 Muscle weakness (generalized): Secondary | ICD-10-CM | POA: Diagnosis not present

## 2023-12-11 DIAGNOSIS — I10 Essential (primary) hypertension: Secondary | ICD-10-CM | POA: Diagnosis not present

## 2023-12-11 DIAGNOSIS — I5022 Chronic systolic (congestive) heart failure: Secondary | ICD-10-CM | POA: Diagnosis not present

## 2023-12-15 DIAGNOSIS — M6281 Muscle weakness (generalized): Secondary | ICD-10-CM | POA: Diagnosis not present

## 2023-12-16 DIAGNOSIS — M6281 Muscle weakness (generalized): Secondary | ICD-10-CM | POA: Diagnosis not present

## 2023-12-17 DIAGNOSIS — M6281 Muscle weakness (generalized): Secondary | ICD-10-CM | POA: Diagnosis not present

## 2023-12-22 DIAGNOSIS — M6281 Muscle weakness (generalized): Secondary | ICD-10-CM | POA: Diagnosis not present

## 2023-12-26 DIAGNOSIS — M6281 Muscle weakness (generalized): Secondary | ICD-10-CM | POA: Diagnosis not present

## 2023-12-29 DIAGNOSIS — M6281 Muscle weakness (generalized): Secondary | ICD-10-CM | POA: Diagnosis not present

## 2023-12-31 DIAGNOSIS — M6281 Muscle weakness (generalized): Secondary | ICD-10-CM | POA: Diagnosis not present

## 2024-01-05 DIAGNOSIS — M6281 Muscle weakness (generalized): Secondary | ICD-10-CM | POA: Diagnosis not present

## 2024-01-07 DIAGNOSIS — M6281 Muscle weakness (generalized): Secondary | ICD-10-CM | POA: Diagnosis not present

## 2024-01-09 DIAGNOSIS — M6281 Muscle weakness (generalized): Secondary | ICD-10-CM | POA: Diagnosis not present

## 2024-01-10 DIAGNOSIS — I10 Essential (primary) hypertension: Secondary | ICD-10-CM | POA: Diagnosis not present

## 2024-01-10 DIAGNOSIS — I5022 Chronic systolic (congestive) heart failure: Secondary | ICD-10-CM | POA: Diagnosis not present

## 2024-01-13 DIAGNOSIS — M6281 Muscle weakness (generalized): Secondary | ICD-10-CM | POA: Diagnosis not present

## 2024-01-14 DIAGNOSIS — M6281 Muscle weakness (generalized): Secondary | ICD-10-CM | POA: Diagnosis not present

## 2024-01-21 DIAGNOSIS — M6281 Muscle weakness (generalized): Secondary | ICD-10-CM | POA: Diagnosis not present

## 2024-01-23 DIAGNOSIS — M6281 Muscle weakness (generalized): Secondary | ICD-10-CM | POA: Diagnosis not present

## 2024-04-22 ENCOUNTER — Other Ambulatory Visit (HOSPITAL_COMMUNITY): Payer: Self-pay | Admitting: Internal Medicine

## 2024-04-22 DIAGNOSIS — Z1231 Encounter for screening mammogram for malignant neoplasm of breast: Secondary | ICD-10-CM

## 2024-05-12 ENCOUNTER — Ambulatory Visit (HOSPITAL_COMMUNITY)
